# Patient Record
Sex: Female | Born: 1950 | Race: White | Hispanic: No | Marital: Married | Smoking: Former smoker
Health system: Southern US, Community
[De-identification: ages and names within clinical notes are randomized; demographics above are authoritative.]

## PROBLEM LIST (undated history)

## (undated) DIAGNOSIS — K5730 Diverticulosis of large intestine without perforation or abscess without bleeding: Secondary | ICD-10-CM

## (undated) DIAGNOSIS — Z8719 Personal history of other diseases of the digestive system: Secondary | ICD-10-CM

## (undated) DIAGNOSIS — I81 Portal vein thrombosis: Secondary | ICD-10-CM

## (undated) DIAGNOSIS — Z87442 Personal history of urinary calculi: Secondary | ICD-10-CM

## (undated) DIAGNOSIS — N200 Calculus of kidney: Secondary | ICD-10-CM

## (undated) DIAGNOSIS — J309 Allergic rhinitis, unspecified: Secondary | ICD-10-CM

## (undated) DIAGNOSIS — J189 Pneumonia, unspecified organism: Secondary | ICD-10-CM

## (undated) DIAGNOSIS — C801 Malignant (primary) neoplasm, unspecified: Secondary | ICD-10-CM

## (undated) DIAGNOSIS — D649 Anemia, unspecified: Secondary | ICD-10-CM

## (undated) DIAGNOSIS — F329 Major depressive disorder, single episode, unspecified: Secondary | ICD-10-CM

## (undated) DIAGNOSIS — N189 Chronic kidney disease, unspecified: Secondary | ICD-10-CM

## (undated) DIAGNOSIS — K631 Perforation of intestine (nontraumatic): Secondary | ICD-10-CM

## (undated) DIAGNOSIS — J45909 Unspecified asthma, uncomplicated: Secondary | ICD-10-CM

## (undated) DIAGNOSIS — Y95 Nosocomial condition: Secondary | ICD-10-CM

## (undated) DIAGNOSIS — E039 Hypothyroidism, unspecified: Secondary | ICD-10-CM

## (undated) DIAGNOSIS — E785 Hyperlipidemia, unspecified: Secondary | ICD-10-CM

## (undated) DIAGNOSIS — M1990 Unspecified osteoarthritis, unspecified site: Secondary | ICD-10-CM

## (undated) DIAGNOSIS — I10 Essential (primary) hypertension: Secondary | ICD-10-CM

## (undated) HISTORY — DX: Essential (primary) hypertension: I10

## (undated) HISTORY — PX: EYE SURGERY: SHX253

## (undated) HISTORY — DX: Allergic rhinitis, unspecified: J30.9

## (undated) HISTORY — PX: KIDNEY STONE SURGERY: SHX686

## (undated) HISTORY — DX: Major depressive disorder, single episode, unspecified: F32.9

## (undated) HISTORY — DX: Hyperlipidemia, unspecified: E78.5

## (undated) HISTORY — PX: APPENDECTOMY: SHX54

## (undated) HISTORY — DX: Hypothyroidism, unspecified: E03.9

---

## 1980-05-29 HISTORY — PX: THYROID SURGERY: SHX805

## 1984-05-29 HISTORY — PX: ABDOMINAL HYSTERECTOMY: SHX81

## 1998-11-13 ENCOUNTER — Emergency Department (HOSPITAL_COMMUNITY): Admission: EM | Admit: 1998-11-13 | Discharge: 1998-11-14 | Payer: Self-pay | Admitting: Emergency Medicine

## 1998-11-21 ENCOUNTER — Emergency Department (HOSPITAL_COMMUNITY): Admission: EM | Admit: 1998-11-21 | Discharge: 1998-11-21 | Payer: Self-pay | Admitting: Emergency Medicine

## 1998-11-21 ENCOUNTER — Encounter: Payer: Self-pay | Admitting: Emergency Medicine

## 1998-11-25 ENCOUNTER — Ambulatory Visit (HOSPITAL_COMMUNITY): Admission: RE | Admit: 1998-11-25 | Discharge: 1998-11-25 | Payer: Self-pay | Admitting: Urology

## 1998-11-25 ENCOUNTER — Encounter: Payer: Self-pay | Admitting: Urology

## 1999-07-13 ENCOUNTER — Other Ambulatory Visit: Admission: RE | Admit: 1999-07-13 | Discharge: 1999-07-13 | Payer: Self-pay | Admitting: *Deleted

## 2000-06-27 ENCOUNTER — Other Ambulatory Visit: Admission: RE | Admit: 2000-06-27 | Discharge: 2000-06-27 | Payer: Self-pay | Admitting: *Deleted

## 2001-06-03 ENCOUNTER — Encounter: Payer: Self-pay | Admitting: Family Medicine

## 2001-06-03 ENCOUNTER — Encounter: Admission: RE | Admit: 2001-06-03 | Discharge: 2001-06-03 | Payer: Self-pay | Admitting: Family Medicine

## 2002-09-12 ENCOUNTER — Other Ambulatory Visit: Admission: RE | Admit: 2002-09-12 | Discharge: 2002-09-12 | Payer: Self-pay | Admitting: *Deleted

## 2003-10-15 ENCOUNTER — Encounter: Admission: RE | Admit: 2003-10-15 | Discharge: 2003-10-15 | Payer: Self-pay | Admitting: Family Medicine

## 2004-06-23 ENCOUNTER — Ambulatory Visit: Payer: Self-pay | Admitting: Internal Medicine

## 2004-07-06 ENCOUNTER — Ambulatory Visit: Payer: Self-pay | Admitting: Internal Medicine

## 2004-09-05 ENCOUNTER — Ambulatory Visit (HOSPITAL_COMMUNITY): Admission: RE | Admit: 2004-09-05 | Discharge: 2004-09-05 | Payer: Self-pay | Admitting: Urology

## 2004-09-08 ENCOUNTER — Ambulatory Visit (HOSPITAL_COMMUNITY): Admission: RE | Admit: 2004-09-08 | Discharge: 2004-09-08 | Payer: Self-pay | Admitting: Urology

## 2004-10-18 ENCOUNTER — Inpatient Hospital Stay (HOSPITAL_COMMUNITY): Admission: EM | Admit: 2004-10-18 | Discharge: 2004-10-21 | Payer: Self-pay | Admitting: Emergency Medicine

## 2004-10-19 ENCOUNTER — Ambulatory Visit: Payer: Self-pay | Admitting: Cardiology

## 2004-10-19 ENCOUNTER — Encounter: Payer: Self-pay | Admitting: Cardiology

## 2005-05-27 ENCOUNTER — Emergency Department (HOSPITAL_COMMUNITY): Admission: EM | Admit: 2005-05-27 | Discharge: 2005-05-27 | Payer: Self-pay | Admitting: Emergency Medicine

## 2005-08-18 ENCOUNTER — Encounter: Payer: Self-pay | Admitting: Family Medicine

## 2006-11-12 ENCOUNTER — Inpatient Hospital Stay (HOSPITAL_COMMUNITY): Admission: EM | Admit: 2006-11-12 | Discharge: 2006-11-14 | Payer: Self-pay | Admitting: Emergency Medicine

## 2006-11-20 ENCOUNTER — Ambulatory Visit (HOSPITAL_BASED_OUTPATIENT_CLINIC_OR_DEPARTMENT_OTHER): Admission: RE | Admit: 2006-11-20 | Discharge: 2006-11-20 | Payer: Self-pay | Admitting: Urology

## 2007-06-22 ENCOUNTER — Emergency Department (HOSPITAL_COMMUNITY): Admission: EM | Admit: 2007-06-22 | Discharge: 2007-06-22 | Payer: Self-pay | Admitting: Emergency Medicine

## 2007-11-23 ENCOUNTER — Emergency Department (HOSPITAL_COMMUNITY): Admission: EM | Admit: 2007-11-23 | Discharge: 2007-11-23 | Payer: Self-pay | Admitting: Emergency Medicine

## 2008-03-01 ENCOUNTER — Emergency Department (HOSPITAL_COMMUNITY): Admission: EM | Admit: 2008-03-01 | Discharge: 2008-03-01 | Payer: Self-pay | Admitting: Emergency Medicine

## 2008-07-14 ENCOUNTER — Encounter: Payer: Self-pay | Admitting: Family Medicine

## 2008-07-21 ENCOUNTER — Encounter: Payer: Self-pay | Admitting: Family Medicine

## 2008-07-23 ENCOUNTER — Encounter: Admission: RE | Admit: 2008-07-23 | Discharge: 2008-07-23 | Payer: Self-pay | Admitting: Family Medicine

## 2008-09-21 ENCOUNTER — Encounter: Payer: Self-pay | Admitting: Family Medicine

## 2008-09-24 ENCOUNTER — Ambulatory Visit (HOSPITAL_COMMUNITY): Admission: RE | Admit: 2008-09-24 | Discharge: 2008-09-24 | Payer: Self-pay | Admitting: Urology

## 2008-10-06 ENCOUNTER — Ambulatory Visit: Payer: Self-pay | Admitting: Family Medicine

## 2008-10-06 DIAGNOSIS — I10 Essential (primary) hypertension: Secondary | ICD-10-CM

## 2008-10-06 DIAGNOSIS — E785 Hyperlipidemia, unspecified: Secondary | ICD-10-CM

## 2008-10-06 DIAGNOSIS — N200 Calculus of kidney: Secondary | ICD-10-CM

## 2008-10-06 DIAGNOSIS — E039 Hypothyroidism, unspecified: Secondary | ICD-10-CM | POA: Insufficient documentation

## 2008-10-06 HISTORY — DX: Essential (primary) hypertension: I10

## 2008-10-06 HISTORY — DX: Calculus of kidney: N20.0

## 2008-10-06 HISTORY — DX: Hypothyroidism, unspecified: E03.9

## 2008-10-06 HISTORY — DX: Hyperlipidemia, unspecified: E78.5

## 2008-10-07 ENCOUNTER — Encounter: Payer: Self-pay | Admitting: Family Medicine

## 2008-11-10 ENCOUNTER — Ambulatory Visit: Payer: Self-pay | Admitting: Family Medicine

## 2008-11-10 DIAGNOSIS — K5730 Diverticulosis of large intestine without perforation or abscess without bleeding: Secondary | ICD-10-CM

## 2008-11-10 HISTORY — DX: Diverticulosis of large intestine without perforation or abscess without bleeding: K57.30

## 2009-01-07 ENCOUNTER — Ambulatory Visit: Payer: Self-pay | Admitting: Family Medicine

## 2009-01-22 ENCOUNTER — Encounter: Payer: Self-pay | Admitting: Family Medicine

## 2009-01-29 ENCOUNTER — Ambulatory Visit: Payer: Self-pay | Admitting: Family Medicine

## 2009-01-29 DIAGNOSIS — B9789 Other viral agents as the cause of diseases classified elsewhere: Secondary | ICD-10-CM

## 2009-01-29 DIAGNOSIS — R5383 Other fatigue: Secondary | ICD-10-CM

## 2009-01-29 DIAGNOSIS — R5381 Other malaise: Secondary | ICD-10-CM

## 2009-01-29 DIAGNOSIS — R1013 Epigastric pain: Secondary | ICD-10-CM

## 2009-02-01 ENCOUNTER — Inpatient Hospital Stay (HOSPITAL_COMMUNITY): Admission: EM | Admit: 2009-02-01 | Discharge: 2009-02-04 | Payer: Self-pay | Admitting: Emergency Medicine

## 2009-02-16 ENCOUNTER — Encounter: Payer: Self-pay | Admitting: Family Medicine

## 2009-03-10 ENCOUNTER — Encounter: Payer: Self-pay | Admitting: Family Medicine

## 2009-03-12 ENCOUNTER — Telehealth: Payer: Self-pay | Admitting: Family Medicine

## 2009-04-12 ENCOUNTER — Ambulatory Visit: Payer: Self-pay | Admitting: Family Medicine

## 2009-04-12 DIAGNOSIS — J019 Acute sinusitis, unspecified: Secondary | ICD-10-CM | POA: Insufficient documentation

## 2009-05-06 ENCOUNTER — Telehealth: Payer: Self-pay | Admitting: Family Medicine

## 2009-05-07 ENCOUNTER — Encounter: Admission: RE | Admit: 2009-05-07 | Discharge: 2009-05-07 | Payer: Self-pay | Admitting: General Surgery

## 2009-05-14 ENCOUNTER — Encounter: Admission: RE | Admit: 2009-05-14 | Discharge: 2009-05-14 | Payer: Self-pay | Admitting: General Surgery

## 2009-06-21 ENCOUNTER — Encounter: Payer: Self-pay | Admitting: Family Medicine

## 2009-07-01 LAB — HM MAMMOGRAPHY: HM Mammogram: NORMAL

## 2009-07-02 ENCOUNTER — Encounter (INDEPENDENT_AMBULATORY_CARE_PROVIDER_SITE_OTHER): Payer: Self-pay | Admitting: *Deleted

## 2009-07-16 ENCOUNTER — Ambulatory Visit: Payer: Self-pay | Admitting: Family Medicine

## 2009-07-16 LAB — CONVERTED CEMR LAB
AST: 32 units/L (ref 0–37)
Albumin: 4 g/dL (ref 3.5–5.2)
Alkaline Phosphatase: 60 units/L (ref 39–117)
Basophils Relative: 0.9 % (ref 0.0–3.0)
Bilirubin, Direct: 0.1 mg/dL (ref 0.0–0.3)
CO2: 29 meq/L (ref 19–32)
Calcium: 10.1 mg/dL (ref 8.4–10.5)
Eosinophils Relative: 5.1 % — ABNORMAL HIGH (ref 0.0–5.0)
GFR calc non Af Amer: 109.72 mL/min (ref >60)
HDL: 49.8 mg/dL (ref >39.00)
Hemoglobin: 12 g/dL (ref 12.0–15.0)
Ketones, urine, test strip: NEGATIVE
Lymphocytes Relative: 34.2 % (ref 12.0–46.0)
Monocytes Relative: 7.4 % (ref 3.0–12.0)
Neutro Abs: 2.9 10*3/uL (ref 1.4–7.7)
Neutrophils Relative %: 52.4 % (ref 43.0–77.0)
Nitrite: NEGATIVE
Protein, U semiquant: NEGATIVE
RBC: 3.96 M/uL (ref 3.87–5.11)
Sodium: 141 meq/L (ref 135–145)
Total CHOL/HDL Ratio: 4
Total Protein: 6.9 g/dL (ref 6.0–8.3)
Triglycerides: 265 mg/dL — ABNORMAL HIGH (ref 0.0–149.0)
Urobilinogen, UA: 0.2
VLDL: 53 mg/dL — ABNORMAL HIGH (ref 0.0–40.0)
WBC: 5.4 10*3/uL (ref 4.5–10.5)

## 2009-07-23 ENCOUNTER — Ambulatory Visit: Payer: Self-pay | Admitting: Family Medicine

## 2009-07-23 DIAGNOSIS — J309 Allergic rhinitis, unspecified: Secondary | ICD-10-CM

## 2009-07-23 HISTORY — DX: Allergic rhinitis, unspecified: J30.9

## 2009-07-26 ENCOUNTER — Encounter: Admission: RE | Admit: 2009-07-26 | Discharge: 2009-07-26 | Payer: Self-pay | Admitting: Family Medicine

## 2009-07-29 ENCOUNTER — Telehealth: Payer: Self-pay | Admitting: Family Medicine

## 2009-07-29 DIAGNOSIS — M25519 Pain in unspecified shoulder: Secondary | ICD-10-CM

## 2009-08-10 ENCOUNTER — Telehealth: Payer: Self-pay | Admitting: Family Medicine

## 2009-08-11 ENCOUNTER — Encounter (INDEPENDENT_AMBULATORY_CARE_PROVIDER_SITE_OTHER): Payer: Self-pay | Admitting: *Deleted

## 2009-08-12 ENCOUNTER — Ambulatory Visit: Payer: Self-pay | Admitting: Internal Medicine

## 2009-08-16 ENCOUNTER — Telehealth: Payer: Self-pay | Admitting: Family Medicine

## 2009-08-26 ENCOUNTER — Ambulatory Visit: Payer: Self-pay | Admitting: Internal Medicine

## 2009-09-14 ENCOUNTER — Encounter: Payer: Self-pay | Admitting: Family Medicine

## 2009-09-14 ENCOUNTER — Encounter: Admission: RE | Admit: 2009-09-14 | Discharge: 2009-09-14 | Payer: Self-pay | Admitting: Family Medicine

## 2009-09-21 ENCOUNTER — Encounter: Payer: Self-pay | Admitting: Family Medicine

## 2009-09-29 ENCOUNTER — Ambulatory Visit: Payer: Self-pay | Admitting: Family Medicine

## 2009-09-30 LAB — CONVERTED CEMR LAB: TSH: 9.36 microintl units/mL — ABNORMAL HIGH (ref 0.35–5.50)

## 2009-10-01 ENCOUNTER — Telehealth: Payer: Self-pay | Admitting: Family Medicine

## 2009-10-11 ENCOUNTER — Ambulatory Visit: Payer: Self-pay | Admitting: Family Medicine

## 2009-10-11 LAB — CONVERTED CEMR LAB
Bilirubin Urine: NEGATIVE
Blood in Urine, dipstick: NEGATIVE
Glucose, Urine, Semiquant: NEGATIVE
Protein, U semiquant: NEGATIVE
Specific Gravity, Urine: 1.015
pH: 6

## 2009-12-27 ENCOUNTER — Encounter: Payer: Self-pay | Admitting: Family Medicine

## 2010-01-04 ENCOUNTER — Ambulatory Visit: Payer: Self-pay | Admitting: Family Medicine

## 2010-01-05 LAB — CONVERTED CEMR LAB: TSH: 7.4 microintl units/mL — ABNORMAL HIGH (ref 0.35–5.50)

## 2010-02-23 ENCOUNTER — Ambulatory Visit: Payer: Self-pay | Admitting: Family Medicine

## 2010-02-23 DIAGNOSIS — F329 Major depressive disorder, single episode, unspecified: Secondary | ICD-10-CM

## 2010-02-23 DIAGNOSIS — F3289 Other specified depressive episodes: Secondary | ICD-10-CM

## 2010-02-23 HISTORY — DX: Other specified depressive episodes: F32.89

## 2010-02-23 HISTORY — DX: Major depressive disorder, single episode, unspecified: F32.9

## 2010-03-28 ENCOUNTER — Ambulatory Visit: Payer: Self-pay | Admitting: Family Medicine

## 2010-03-28 ENCOUNTER — Telehealth: Payer: Self-pay | Admitting: Family Medicine

## 2010-04-01 ENCOUNTER — Ambulatory Visit: Payer: Self-pay | Admitting: Family Medicine

## 2010-04-18 ENCOUNTER — Ambulatory Visit: Payer: Self-pay | Admitting: Family Medicine

## 2010-05-03 ENCOUNTER — Ambulatory Visit: Payer: Self-pay | Admitting: Family Medicine

## 2010-05-19 ENCOUNTER — Telehealth: Payer: Self-pay | Admitting: Family Medicine

## 2010-05-19 ENCOUNTER — Ambulatory Visit: Payer: Self-pay | Admitting: Family Medicine

## 2010-06-19 ENCOUNTER — Encounter: Payer: Self-pay | Admitting: Family Medicine

## 2010-06-21 ENCOUNTER — Other Ambulatory Visit: Payer: Self-pay | Admitting: Family Medicine

## 2010-06-21 DIAGNOSIS — Z1239 Encounter for other screening for malignant neoplasm of breast: Secondary | ICD-10-CM

## 2010-06-28 NOTE — Letter (Signed)
Summary: [facility] Instructions  [name]  [address] [place], [place] [address]   Phone: [phone]  Fax: [phone]       [name]    Feb 02, 1951    MRN: [id number]        Procedure Day Dorna Bloom:  [name]  08/26/09     Arrival Time:  10:00AM     Procedure Time:  11:00AM     Location of Procedure:                    [name] _  [facility] (4th Floor)                        PREPARATION FOR COLONOSCOPY WITH MOVIPREP   Starting 5 days prior to your procedure 08/21/09 do not eat nuts, seeds, popcorn, corn, beans, peas,  salads, or any raw vegetables.  Do not take any fiber supplements (e.g. Metamucil, Citrucel, and Benefiber).  THE DAY BEFORE YOUR PROCEDURE         DATE: 08/25/09  DAY: WEDNESDAY  1.  Drink clear liquids the entire day-NO SOLID FOOD  2.  Do not drink anything colored red or purple.  Avoid juices with pulp.  No orange juice.  3.  Drink at least 64 oz. (8 glasses) of fluid/clear liquids during the day to prevent dehydration and help the prep work efficiently.  CLEAR LIQUIDS INCLUDE: Water Jello Ice Popsicles Tea (sugar ok, no milk/cream) Powdered fruit flavored drinks Coffee (sugar ok, no milk/cream) Gatorade Juice: apple, white grape, white cranberry  Lemonade Clear bullion, consomm, broth Carbonated beverages (any kind) Strained chicken noodle soup Hard Candy                             4.  In the morning, mix first dose of MoviPrep solution:    Empty 1 Pouch A and 1 Pouch B into the disposable container    Add lukewarm drinking water to the top line of the container. Mix to dissolve    Refrigerate (mixed solution should be used within 24 hrs)  5.  Begin drinking the prep at 5:00 p.m. The MoviPrep container is divided by 4 marks.   Every 15 minutes drink the solution down to the next mark (approximately 8 oz) until the full liter is complete.   6.  Follow completed prep with 16 oz of clear liquid of your  choice (Nothing red or purple).  Continue to drink clear liquids until bedtime.  7.  Before going to bed, mix second dose of MoviPrep solution:    Empty 1 Pouch A and 1 Pouch B into the disposable container    Add lukewarm drinking water to the top line of the container. Mix to dissolve    Refrigerate  THE DAY OF YOUR PROCEDURE      DATE: 08/26/09  DAY: THURSDAY  Beginning at 6:00AM (5 hours before procedure):         1. Every 15 minutes, drink the solution down to the next mark (approx 8 oz) until the full liter is complete.  2. Follow completed prep with 16 oz. of clear liquid of your choice.    3. You may drink clear liquids until 9:00AM (2 HOURS BEFORE PROCEDURE).   MEDICATION INSTRUCTIONS  Unless otherwise instructed, you should take regular prescription medications with a small sip of water   as early as possible the morning  of your procedure.         OTHER INSTRUCTIONS  You will need a responsible adult at least 58 years of age to accompany you and drive you home.   This person must remain in the waiting room during your procedure.  Wear loose fitting clothing that is easily removed.  Leave jewelry and other valuables at home.  However, you may wish to bring a book to read or  an iPod/MP3 player to listen to music as you wait for your procedure to start.  Remove all body piercing jewelry and leave at home.  Total time from sign-in until discharge is approximately 2-3 hours.  You should go home directly after your procedure and rest.  You can resume normal activities the  day after your procedure.  The day of your procedure you should not:   Drive   Make legal decisions   Operate machinery   Drink alcohol   Return to work  You will receive specific instructions about eating, activities and medications before you leave.    The above instructions have been reviewed and explained to me by  Karl Bales RN  August 12, 2009 9:27 AM     I fully  understand and can verbalize these instructions _____________________________ Date _________

## 2010-06-28 NOTE — Letter (Signed)
Summary: [facility]   Imported By: Maryln Gottron 11/16/2009 15:23:09  _____________________________________________________________________  External Attachment:    Type:   Image     Comment:   External Document

## 2010-06-28 NOTE — Assessment & Plan Note (Signed)
Summary: R WRIST INJURY // RS   Vital Signs:  Patient profile:   60 year old female Temp:     98.6 degrees F oral BP sitting:   120 / 80  (left arm) Cuff size:   regular  Vitals Entered By: Sid Falcon LPN (April 18, 2010 3:20 PM)  History of Present Illness: Patient seen with right wrist and hand injury. She fell yesterday after slipping on wet floor. She fell into a cabinet. Does not recall exact mechanism of right wrist injury. She has some swelling afterwards and pain with extension and flexion of wrist. Applied ice immediately afterwards. No other injuries reported.  Allergies: 1)  ! Provigil (Modafinil)  Past History:  Past Medical History: Last updated: 07/23/2009 Arthritis Asthma Thyroid cancer  Depression Diverticulitis Hyperlipidemia Hypertension Kidney stones Hypothyroidism  Physical Exam  General:  Well-developed,well-nourished,in no acute distress; alert,appropriate and cooperative throughout examination Lungs:  Normal respiratory effort, chest expands symmetrically. Lungs are clear to auscultation, no crackles or wheezes. Heart:  normal rate and regular rhythm.   Extremities:  right wrist reveals some obvious swelling distally. There is no visible ecchymosis. She has pain with extension and flexion. She has tenderness over the distal radius and also some noted tenderness over navicular region. There is no ulnar tenderness. Full range of motion all digits of the hand   Impression & Recommendations:  Problem # 1:  WRIST PAIN (UVO-536.64) Assessment New clinically, need to rule out distal radial as well as navicular fracture. Even if x-ray is negative for navicular fracture needs thumb spica splint and re- evaluation in 2 weeks given her tenderness over navicular region Orders: T-Wrist Comp Right (73110TC)  Complete Medication List: 1)  Cozaar 50 Mg Tabs (Losartan potassium) .... Once daily 2)  Mobic 15 Mg Tabs (Meloxicam) .... One tab daily 3)   Zoloft 100 Mg Tabs (Sertraline hcl) .... Once daily 4)  Ambien 10 Mg Tabs (Zolpidem tartrate) .... Once daily 5)  Singulair 10 Mg Tabs (Montelukast sodium) .... Once daily 6)  Vitamin D 1000 Unit Caps (Cholecalciferol) .... 2 tabs daily, on hold 7)  Womens 50+ Multi Vitamin/min Tabs (Multiple vitamins-minerals) .... Once daily, on hold 8)  Claritin 10 Mg Caps (Loratadine) .... Once daily 9)  Proventil Hfa 108 (90 Base) Mcg/act Aers (Albuterol sulfate) .... As needed 10)  Urocit-k 15 15 Meq (1620 Mg) Cr-tabs (Potassium citrate) .... One tab two times a day 11)  Flaxseed Oil 1000 Mg Caps (Flaxseed (linseed)) .... 2 daily 12)  Oxycodone-acetaminophen 5-325 Mg Tabs (Oxycodone-acetaminophen) .... As needed pain 13)  Synthroid 137 Mcg Tabs (Levothyroxine sodium) .... Once daily daw, brand only 14)  Wellbutrin Xl 150 Mg Xr24h-tab (Bupropion hcl) .... One by mouth once daily  Other Orders: T-Hand Right 3 views (73130TC)  Patient Instructions: 1)  Please schedule a follow-up appointment in 2 weeks.  2)  Continue with ice and elevation of wrist and hand. 3)  We will call you regarding x-ray result. 4)  Keep splint on at all times.   Orders Added: 1)  T-Wrist Comp Right [73110TC] 2)  T-Hand Right 3 views [73130TC] 3)  Est. Patient Level III [[address]]

## 2010-06-28 NOTE — Miscellaneous (Signed)
Summary: RECALL COL.Marland KitchenMarland KitchenEM  Clinical Lists Changes  Medications: Added new medication of MOVIPREP 100 GM  SOLR (PEG-KCL-NACL-NASULF-NA ASC-C) As per prep instructions. - Signed Rx of MOVIPREP 100 GM  SOLR (PEG-KCL-NACL-NASULF-NA ASC-C) As per prep instructions.;  #1 x 0;  Signed;  Entered by: Karl Bales RN;  Authorized by: Mardella Layman MD [facility];  Method used: Electronically to [facility] [address] [phone]*, [address], [place], [place]  [address], Ph: [phone], Fax: [phone]    Prescriptions: MOVIPREP 100 GM  SOLR (PEG-KCL-NACL-NASULF-NA ASC-C) As per prep instructions.  #1 x 0   Entered by:   Karl Bales RN   Authorized by:   Mardella Layman MD [facility]   Signed by:   Karl Bales RN on 08/12/2009   Method used:   Electronically to        [facility] [address] [phone]* (retail)       [address]       [place], [place]  [address]       Ph: [phone]       Fax: [phone]   RxID:   [id number]   Appended Document: RECALL COL.Marland KitchenMarland KitchenEM Pt is a patient of Dr. Yancey Flemings, not Dr. Jarold Motto.

## 2010-06-28 NOTE — Letter (Signed)
Summary: [facility]  [facility]   Imported By: Maryln Gottron 01/03/2010 11:28:00  _____________________________________________________________________  External Attachment:    Type:   Image     Comment:   External Document

## 2010-06-28 NOTE — Progress Notes (Signed)
Summary: Oxycodone refill request  Phone Note Call from Patient   Caller: Spouse Call For: Evelena Peat MD Summary of Call: Pt is feeling better, however has run out of ther pain med, requesting refill of Oxycodone 5-325. [facility] Initial call taken by: Sid Falcon LPN,  Oct 02, 6267 9:06 AM  Follow-up for Phone Call        will refill once  Follow-up by: Evelena Peat MD,  Oct 01, 2009 10:13 AM  Additional Follow-up for Phone Call Additional follow up Details #1::        Pt husband informed Rx ready for pick-up Additional Follow-up by: Sid Falcon LPN,  Oct 01, 4852 10:30 AM    Prescriptions: OXYCODONE-ACETAMINOPHEN 5-325 MG TABS (OXYCODONE-ACETAMINOPHEN) as needed pain  #20 x 0   Entered and Authorized by:   Evelena Peat MD   Signed by:   Evelena Peat MD on 10/01/2009   Method used:   Print then Give to Patient   RxID:   [id number]

## 2010-06-28 NOTE — Assessment & Plan Note (Signed)
Summary: severe lower back pain/cjr   Vital Signs:  Patient profile:   60 year old female Temp:     98.7 degrees F oral BP sitting:   140 / 88  (left arm) Cuff size:   regular  Vitals Entered By: Sid Falcon LPN (Oct 11, 2009 1:59 PM) CC: severe low back pain   History of Present Illness: Patient here with low back pain. Started 9 days ago while walking. Sudden sharp pain lower lumbar area. Initially did better but by last Thursday had some recurrence and more persistent since then. Moderate to severe at times. Sharp quality. No radiation. Taking hydrocodone and Flexeril which did relieve somewhat. No associated numbness, weakness, or incontinence. No appetite or weight changes.  She has pain pretty much any position.  Allergies: 1)  ! Provigil (Modafinil)  Past History:  Past Medical History: Last updated: 07/23/2009 Arthritis Asthma Thyroid cancer  Depression Diverticulitis Hyperlipidemia Hypertension Kidney stones Hypothyroidism  Review of Systems  The patient denies anorexia, fever, weight loss, abdominal pain, hematuria, incontinence, and muscle weakness.    Physical Exam  General:  Well-developed,well-nourished,in no acute distress; alert,appropriate and cooperative throughout examination Lungs:  Normal respiratory effort, chest expands symmetrically. Lungs are clear to auscultation, no crackles or wheezes. Heart:  Normal rate and regular rhythm. S1 and S2 normal without gallop, murmur, click, rub or other extra sounds. Msk:  no reproducible point tenderness in low back Extremities:  normal dorsalis pedis pulses bilaterally straight leg raise is negative.   Neurologic:  strength normal in all extremities, sensation intact to light touch, and DTRs symmetrical and normal.     Impression & Recommendations:  Problem # 1:  LUMBAGO (ICD-724.2)  continue Flexeril and hydrocodone as needed. Extension stretches reviewed. Her updated medication list for this  problem includes:    Mobic 15 Mg Tabs (Meloxicam) ..... One tab daily    Oxycodone-acetaminophen 5-325 Mg Tabs (Oxycodone-acetaminophen) .Marland Kitchen... As needed pain  Orders: UA Dipstick w/o Micro (manual) (02542)  Complete Medication List: 1)  Cozaar 50 Mg Tabs (Losartan potassium) .... Once daily 2)  Mobic 15 Mg Tabs (Meloxicam) .... One tab daily 3)  Zoloft 100 Mg Tabs (Sertraline hcl) .... Once daily 4)  Ambien 10 Mg Tabs (Zolpidem tartrate) .... Once daily 5)  Singulair 10 Mg Tabs (Montelukast sodium) .... Once daily 6)  Vitamin D 1000 Unit Caps (Cholecalciferol) .... 2 tabs daily, on hold 7)  Womens 50+ Multi Vitamin/min Tabs (Multiple vitamins-minerals) .... Once daily, on hold 8)  Claritin 10 Mg Caps (Loratadine) .... Once daily 9)  Proventil Hfa 108 (90 Base) Mcg/act Aers (Albuterol sulfate) .... As needed 10)  Synthroid 112 Mcg Tabs (Levothyroxine sodium) .... Alternate every other day with 125 mg 11)  Urocit-k 15 15 Meq (1620 Mg) Cr-tabs (Potassium citrate) .... One tab two times a day 12)  Astelin 137 Mcg/spray Soln (Azelastine hcl) .... 2 sprays per nostril two times a day prn 13)  Flaxseed Oil 1000 Mg Caps (Flaxseed (linseed)) .... 2 daily 14)  Oxycodone-acetaminophen 5-325 Mg Tabs (Oxycodone-acetaminophen) .... As needed pain 15)  Metronidazole 500 Mg Tabs (Metronidazole) .... One by mouth three times a day for 10 days 16)  Ciprofloxacin Hcl 750 Mg Tabs (Ciprofloxacin hcl) .... One by mouth two times a day for 10 days 17)  Synthroid 125 Mcg Tabs (Levothyroxine sodium) .... Alternate every other day with 112 mg  Patient Instructions: 1)  Most patients (90%) with low back pain will improve with time ( 2-6 weeks).  Keep active but avoid activities that are painful. Apply moist heat and/or ice to lower back several times a day.  2)  Do extension stretches as instructed. 3)  Use firm mattress. 4)  Follow up in 2 weeks if no better and sooner if any weakness, lower extremity numbness,  or any progressive pain noted.  Laboratory Results   Urine Tests    Routine Urinalysis   Color: yellow Appearance: Clear Glucose: negative   (Normal Range: Negative) Bilirubin: negative   (Normal Range: Negative) Ketone: negative   (Normal Range: Negative) Spec. Gravity: 1.015   (Normal Range: 1.003-1.035) Blood: negative   (Normal Range: Negative) pH: 6.0   (Normal Range: 5.0-8.0) Protein: negative   (Normal Range: Negative) Urobilinogen: 0.2   (Normal Range: 0-1) Nitrite: negative   (Normal Range: Negative) Leukocyte Esterace: small   (Normal Range: Negative)    Comments: Sid Falcon LPN  Oct 11, 2009 2:06 PM

## 2010-06-28 NOTE — Letter (Signed)
Summary: [facility]   Imported By: Maryln Gottron 09/24/2009 11:20:53  _____________________________________________________________________  External Attachment:    Type:   Image     Comment:   External Document

## 2010-06-28 NOTE — Assessment & Plan Note (Signed)
Summary: flare up diverticulitis last night/njr   Vital Signs:  Patient profile:   60 year old female Weight:      198 pounds Temp:     99.5 degrees F oral BP sitting:   88 / 64  (left arm) Cuff size:   regular  Vitals Entered By: Sid Falcon LPN (Sep 29, 7104 11:48 AM)  Serial Vital Signs/Assessments:  Time      Position  BP       Pulse  Resp  Temp     By 12:16 PM            100/78                         Sid Falcon LPN  CC: diverticulitis flare-up   History of Present Illness: Patient here for followup regarding:  History of recurrent diverticulitis. Onset last night of moderate to severe pain left lower quadrant. Pain is sharp and achy at times. Exacerbated somewhat with movement. No change of bowel habits. Low-grade fever. No chills. Denies any nausea or vomiting. Has responded well to oral antibiotics in the past.  No alleviating pain.  Hypothyroidism. Needs repeat TSH. Compliant with therapy.  over-replaced by recent labs and thyroid dose reduced.  Allergies: 1)  ! Provigil (Modafinil)  Past History:  Past Medical History: Last updated: 07/23/2009 Arthritis Asthma Thyroid cancer  Depression Diverticulitis Hyperlipidemia Hypertension Kidney stones Hypothyroidism  Past Surgical History: Last updated: 10/06/2008 Hysterectomy 1986 Thyroid tumor removed 1982 C-Section 1983 Rotator cuff 2005 Kidney stone rempoval 2000, 2009, 2010  Social History: Last updated: 10/06/2008 Occupation:  Marine scientist Married Alcohol use-no Previous smoker PMH reviewed for relevance, SH/Risk Factors reviewed for relevance  Review of Systems       The patient complains of fever.  The patient denies anorexia, weight loss, chest pain, syncope, dyspnea on exertion, melena, hematochezia, and severe indigestion/heartburn.    Physical Exam  General:  Well-developed,well-nourished,in no acute distress; alert,appropriate and cooperative throughout examination Mouth:   Oral mucosa and oropharynx without lesions or exudates.  Teeth in good repair. Neck:  No deformities, masses, or tenderness noted. Lungs:  Normal respiratory effort, chest expands symmetrically. Lungs are clear to auscultation, no crackles or wheezes. Heart:  Normal rate and regular rhythm. S1 and S2 normal without gallop, murmur, click, rub or other extra sounds. Abdomen:  soft, normal bowel sounds, no distention, and no masses.  tender left lower quadrant to deep palpation. No guarding or rebound. No palpated masses.   Impression & Recommendations:  Problem # 1:  DIVERTICULITIS, ACUTE (ICD-562.11) Assessment New get back on Cipro and Flagyl.  Problem # 2:  HYPOTHYROIDISM (ICD-244.9) reassess labs. Her updated medication list for this problem includes:    Synthroid 112 Mcg Tabs (Levothyroxine sodium) ..... One by mouth once daily  Orders: Venipuncture (26948) TLB-TSH (Thyroid Stimulating Hormone) (84443-TSH)  Complete Medication List: 1)  Cozaar 50 Mg Tabs (Losartan potassium) .... Once daily 2)  Mobic 15 Mg Tabs (Meloxicam) .... One tab daily 3)  Zoloft 100 Mg Tabs (Sertraline hcl) .... Once daily 4)  Ambien 10 Mg Tabs (Zolpidem tartrate) .... Once daily 5)  Singulair 10 Mg Tabs (Montelukast sodium) .... Once daily 6)  Vitamin D 1000 Unit Caps (Cholecalciferol) .... 2 tabs daily, on hold 7)  Womens 50+ Multi Vitamin/min Tabs (Multiple vitamins-minerals) .... Once daily, on hold 8)  Claritin 10 Mg Caps (Loratadine) .... Once daily 9)  Proventil Hfa 108 (90  Base) Mcg/act Aers (Albuterol sulfate) .... As needed 10)  Synthroid 112 Mcg Tabs (Levothyroxine sodium) .... One by mouth once daily 11)  Urocit-k 15 15 Meq (1620 Mg) Cr-tabs (Potassium citrate) .... One tab two times a day 12)  Astelin 137 Mcg/spray Soln (Azelastine hcl) .... 2 sprays per nostril two times a day prn 13)  Flaxseed Oil 1000 Mg Caps (Flaxseed (linseed)) .... 2 daily 14)  Oxycodone-acetaminophen 5-325 Mg Tabs  (Oxycodone-acetaminophen) .... As needed pain 15)  Metronidazole 500 Mg Tabs (Metronidazole) .... One by mouth three times a day for 10 days 16)  Ciprofloxacin Hcl 750 Mg Tabs (Ciprofloxacin hcl) .... One by mouth two times a day for 10 days  Patient Instructions: 1)  Follow up immediately if you have any severe nausea or vomiting or worsening abdominal pain Prescriptions: CIPROFLOXACIN HCL 750 MG TABS (CIPROFLOXACIN HCL) one by mouth two times a day for 10 days  #20 x 0   Entered and Authorized by:   Evelena Peat MD   Signed by:   Evelena Peat MD on 09/29/2009   Method used:   Electronically to        [facility]. [phone]* (retail)       [phone] [address]       [place], [place]  [address]       Ph: [phone]       Fax: [phone]   RxID:   [id number] METRONIDAZOLE 500 MG TABS (METRONIDAZOLE) one by mouth three times a day for 10 days  #30 x 0   Entered and Authorized by:   Evelena Peat MD   Signed by:   Evelena Peat MD on 09/29/2009   Method used:   Electronically to        [facility]. [phone]* (retail)       [phone] [address]       [place], [place]  [address]       Ph: [phone]       Fax: [phone]   RxID:   [id number]

## 2010-06-28 NOTE — Letter (Signed)
Summary: Colonoscopy Letter  [facility]  [address] [place], [place] [address]   Phone: [phone]  Fax: [phone]      July 02, 2009 MRN: [id number]   [facility] [address] [place], [place]  [address]   Dear Ms. Liebler,   According to your medical record, it is time for you to schedule a Colonoscopy. The American Cancer Society recommends this procedure as a method to detect early colon cancer. Patients with a family history of colon cancer, or a personal history of colon polyps or inflammatory bowel disease are at increased risk.  This letter has beeen generated based on the recommendations made at the time of your procedure. If you feel that in your particular situation this may no longer apply, please contact our office.  Please call our office at [phone] to schedule this appointment or to update your records at your earliest convenience.  Thank you for cooperating with [place] to provide you with the very best care possible.   Sincerely,  Wilhemina Bonito. Marina Goodell, M.D.  [facility] [phone]

## 2010-06-28 NOTE — Assessment & Plan Note (Signed)
Summary: CONSULT RE: MED AND SHOULDER PAIN/CJR   Vital Signs:  Patient profile:   60 year old female Weight:      200 pounds Temp:     98.1 degrees F oral BP sitting:   120 / 80  (left arm) Cuff size:   regular  Vitals Entered By: Kathrynn Speed CMA (February 23, 2010 3:05 PM) CC: shoulder pain, possible Rx for Welbutrin,src Is Patient Diabetic? No   History of Present Illness: Patient seen for the following items  New problem right shoulder pain for approx. 2 weeks. No injury. Quality is achy. Moderate severity. Pain radiates slightly to the deltoid. Worse with internal rotation. Constant pain. Worse at night. MOBIC without relief. No swelling. Prior history of left rotator cuff repair.  Other issue is worsening depression. Long history of depression currently treated Zoloft. Has previously been on combination with Wellbutrin. Has low energy and low initiative.  Would like to add back Wellbutrin.  No suicidal ideation.  Current Medications (verified): 1)  Cozaar 50 Mg Tabs (Losartan Potassium) .... Once Daily 2)  Mobic 15 Mg Tabs (Meloxicam) .... One Tab Daily 3)  Zoloft 100 Mg Tabs (Sertraline Hcl) .... Once Daily 4)  Ambien 10 Mg Tabs (Zolpidem Tartrate) .... Once Daily 5)  Singulair 10 Mg Tabs (Montelukast Sodium) .... Once Daily 6)  Vitamin D 1000 Unit Caps (Cholecalciferol) .... 2 Tabs Daily, On Hold 7)  Womens 50+ Multi Vitamin/min  Tabs (Multiple Vitamins-Minerals) .... Once Daily, On Hold 8)  Claritin 10 Mg Caps (Loratadine) .... Once Daily 9)  Proventil Hfa 108 (90 Base) Mcg/act Aers (Albuterol Sulfate) .... As Needed 10)  Urocit-K 15 15 Meq (1620 Mg) Cr-Tabs (Potassium Citrate) .... One Tab Two Times A Day 11)  Flaxseed Oil 1000 Mg Caps (Flaxseed (Linseed)) .... 2 Daily 12)  Oxycodone-Acetaminophen 5-325 Mg Tabs (Oxycodone-Acetaminophen) .... As Needed Pain 13)  Synthroid 125 Mcg Tabs (Levothyroxine Sodium) .Marland Kitchen.. 1 By Mouth Once Daily  Allergies (verified): 1)  !  Provigil (Modafinil)  Past History:  Past Medical History: Last updated: 07/23/2009 Arthritis Asthma Thyroid cancer  Depression Diverticulitis Hyperlipidemia Hypertension Kidney stones Hypothyroidism PMH reviewed for relevance  Review of Systems  The patient denies anorexia, weight loss, chest pain, dyspnea on exertion, peripheral edema, prolonged cough, headaches, and muscle weakness.    Physical Exam  General:  Well-developed,well-nourished,in no acute distress; alert,appropriate and cooperative throughout examination Neck:  No deformities, masses, or tenderness noted. Lungs:  Normal respiratory effort, chest expands symmetrically. Lungs are clear to auscultation, no crackles or wheezes. Heart:  Normal rate and regular rhythm. S1 and S2 normal without gallop, murmur, click, rub or other extra sounds. Extremities:  patient has fair range of motion right shoulder but pain with internal rotation and also abduction greater than 90. No a.c. joint tenderness. No biceps or triceps tenderness. Neurologic:  strength normal in all extremities.     Impression & Recommendations:  Problem # 1:  SHOULDER PAIN, RIGHT (ICD-719.41) Assessment New Suspect rotator cuff tendonitis/bursitis. after reviewing risks and benefits and obtaining informed consent prepped right shoulder with Betadine and using sterile technique injected 40 mg Depo-Medrol 2 cc plain Xylocaine using posterior lateral approach without difficulty Her updated medication list for this problem includes:    Mobic 15 Mg Tabs (Meloxicam) ..... One tab daily    Oxycodone-acetaminophen 5-325 Mg Tabs (Oxycodone-acetaminophen) .Marland Kitchen... As needed pain  Orders: Joint Aspirate / Injection, Large (20610) Depo- Medrol 40mg  (J1030)  Problem # 2:  DEPRESSION (ICD-311) Assessment: Deteriorated  add back Wellbutrin 150 mg.  Reassess one month. Her updated medication list for this problem includes:    Zoloft 100 Mg Tabs (Sertraline hcl)  ..... Once daily    Wellbutrin Xl 150 Mg Xr24h-tab (Bupropion hcl) ..... One by mouth once daily  Complete Medication List: 1)  Cozaar 50 Mg Tabs (Losartan potassium) .... Once daily 2)  Mobic 15 Mg Tabs (Meloxicam) .... One tab daily 3)  Zoloft 100 Mg Tabs (Sertraline hcl) .... Once daily 4)  Ambien 10 Mg Tabs (Zolpidem tartrate) .... Once daily 5)  Singulair 10 Mg Tabs (Montelukast sodium) .... Once daily 6)  Vitamin D 1000 Unit Caps (Cholecalciferol) .... 2 tabs daily, on hold 7)  Womens 50+ Multi Vitamin/min Tabs (Multiple vitamins-minerals) .... Once daily, on hold 8)  Claritin 10 Mg Caps (Loratadine) .... Once daily 9)  Proventil Hfa 108 (90 Base) Mcg/act Aers (Albuterol sulfate) .... As needed 10)  Urocit-k 15 15 Meq (1620 Mg) Cr-tabs (Potassium citrate) .... One tab two times a day 11)  Flaxseed Oil 1000 Mg Caps (Flaxseed (linseed)) .... 2 daily 12)  Oxycodone-acetaminophen 5-325 Mg Tabs (Oxycodone-acetaminophen) .... As needed pain 13)  Synthroid 125 Mcg Tabs (Levothyroxine sodium) .Marland Kitchen.. 1 by mouth once daily 14)  Wellbutrin Xl 150 Mg Xr24h-tab (Bupropion hcl) .... One by mouth once daily  Patient Instructions: 1)  Please schedule a follow-up appointment in 2 weeks.  Prescriptions: WELLBUTRIN XL 150 MG XR24H-TAB (BUPROPION HCL) one by mouth once daily  #30 x 5   Entered and Authorized by:   Evelena Peat MD   Signed by:   Evelena Peat MD on 02/23/2010   Method used:   Electronically to        [facility]. [phone]* (retail)       [phone]       [place], [place]  [address]       Ph: [phone]       Fax: [phone]   RxID:   [id number]

## 2010-06-28 NOTE — Progress Notes (Signed)
Summary: Referral to Ortho request  Phone Note Call from Patient Call back at Home Phone [phone]   Caller: Patient Call For: Misty Peat MD Summary of Call: VM from pt requesting referral to [facility] for right shoulder.  Pt has appt 3/11, forgot to ask for referral at [facility] earlier.  Initial call taken by: Sid Falcon LPN,  July 29, 2009 12:23 PM  Follow-up for Phone Call        OK to refer. Follow-up by: Misty Peat MD,  July 29, 2009 12:37 PM  New Problems: SHOULDER PAIN, RIGHT (ICD-719.41)   New Problems: SHOULDER PAIN, RIGHT (ICD-719.41)

## 2010-06-28 NOTE — Procedures (Signed)
Summary: Colonoscopy/[facility]  Colonoscopy/[facility]   Imported By: Sherian Rein 08/27/2009 07:37:03  _____________________________________________________________________  External Attachment:    Type:   Image     Comment:   External Document

## 2010-06-28 NOTE — Progress Notes (Signed)
Summary: please order labs  Phone Note Call from Patient Call back at Home Phone [phone]   Caller: Patient--walk in Reason for Call: Acute Illness Summary of Call: was just seen. requesting tsh labs. please order Initial call taken by: Warnell Forester,  March 28, 2010 4:16 PM  Follow-up for Phone Call        Order TSH  ICD-9 244.9 Follow-up by: Evelena Peat MD,  March 28, 2010 5:35 PM  Additional Follow-up for Phone Call Additional follow up Details #1::        lmoam to return my call. Additional Follow-up by: Warnell Forester,  March 29, 2010 8:27 AM    Additional Follow-up for Phone Call Additional follow up Details #2::    lmoam to return my call. Follow-up by: Warnell Forester,  March 31, 2010 2:14 PM  Additional Follow-up for Phone Call Additional follow up Details #3:: Details for Additional Follow-up Action Taken: pt had labs done today. Additional Follow-up by: Warnell Forester,  April 01, 2010 4:45 PM

## 2010-06-28 NOTE — Assessment & Plan Note (Signed)
Summary: CPX // RS   Vital Signs:  Patient profile:   60 year old female Height:      67.75 inches Weight:      198 pounds BMI:     30.44 Temp:     99.0 degrees F oral Pulse rate:   80 / minute Pulse rhythm:   regular Resp:     12 per minute BP sitting:   120 / 72  (left arm) Cuff size:   regular  Vitals Entered By: Sid Falcon LPN (July 23, 2009 8:27 AM)  Nutrition Counseling: Patient's BMI is greater than 25 and therefore counseled on weight management options. CC: CPX, Lipid Management   History of Present Illness: Here for CPE.  PMH reviewed .  Recent kidney stone problems, followed by urology.  Compliant with all meds.  Repeat Mammogram scheduled.  Colonoscopy repeat scheduled.  Last tetanus > 10 years.  Not exercising.  Hx TAH for benign disease, so no pap indicated.  Separate problem of freq, daily postnasal drip symptoms.  ON Singulair and has used nasal steroids in past without improvement.  No purulent d/c or fever.  Freq associated bifrontal headaches.  Hypothyroidism and recent labs reveal thyroid sl over-replaced.  No weight loss, palpitation, tachycardia, or diarrhea.  C/O difficulty losing weight but not consistently exercising.  No prior formal nutrition counseling.  Lipid Management History:      Positive NCEP/ATP III risk factors include female age 59 years old or older and hypertension.  Negative NCEP/ATP III risk factors include non-diabetic, no family history for ischemic heart disease, non-tobacco-user status, no ASHD (atherosclerotic heart disease), no prior stroke/TIA, no peripheral vascular disease, and no history of aortic aneurysm.    Preventive Screening-Counseling & Management  Alcohol-Tobacco     Smoking Status: quit  Allergies: 1)  ! Provigil (Modafinil)  Past History:  Past Surgical History: Last updated: 10/06/2008 Hysterectomy 1986 Thyroid tumor removed 1982 C-Section 1983 Rotator cuff 2005 Kidney stone rempoval 2000, 2009,  2010  Family History: Last updated: 10/06/2008 Family History of Arthritis Family history of emotional problems  Social History: Last updated: 10/06/2008 Occupation:  Marine scientist Married Alcohol use-no Previous smoker  Risk Factors: Smoking Status: quit (07/23/2009)  Past Medical History: Arthritis Asthma Thyroid cancer  Depression Diverticulitis Hyperlipidemia Hypertension Kidney stones Hypothyroidism PMH-FH-SH reviewed for relevance  Social History: Smoking Status:  quit  Review of Systems  The patient denies anorexia, fever, weight loss, vision loss, decreased hearing, hoarseness, chest pain, syncope, dyspnea on exertion, peripheral edema, prolonged cough, hemoptysis, abdominal pain, melena, hematochezia, severe indigestion/heartburn, hematuria, muscle weakness, suspicious skin lesions, depression, enlarged lymph nodes, and breast masses.         freq rhinitis symptoms not relieved with Singulair.   No prior relief with nasal steroids.  Freq bifrontal headaches.  Difficulty losing weight.  Physical Exam  General:  Well-developed,well-nourished,in no acute distress; alert,appropriate and cooperative throughout examination Head:  Normocephalic and atraumatic without obvious abnormalities. No apparent alopecia or balding. Eyes:  pupils equal, pupils round, and pupils reactive to light.   Ears:  External ear exam shows no significant lesions or deformities.  Otoscopic examination reveals clear canals, tympanic membranes are intact bilaterally without bulging, retraction, inflammation or discharge. Hearing is grossly normal bilaterally. Nose:  External nasal examination shows no deformity or inflammation. Nasal mucosa are pink and moist without lesions or exudates. Mouth:  Oral mucosa and oropharynx without lesions or exudates.  Teeth in good repair. Neck:  No deformities, masses, or tenderness  noted. Breasts:  No mass, nodules, thickening, tenderness, bulging,  retraction, inflamation, nipple discharge or skin changes noted.   Lungs:  Normal respiratory effort, chest expands symmetrically. Lungs are clear to auscultation, no crackles or wheezes. Heart:  Normal rate and regular rhythm. S1 and S2 normal without gallop, murmur, click, rub or other extra sounds. Abdomen:  Bowel sounds positive,abdomen soft and non-tender without masses, organomegaly or hernias noted. Rectal:  deferred sec scheduled colonoscopy Genitalia:  deferred sec hysterectomy Msk:  No deformity or scoliosis noted of thoracic or lumbar spine.   Extremities:  No clubbing, cyanosis, edema, or deformity noted with normal full range of motion of all joints.   Neurologic:  alert & oriented X3, cranial nerves II-XII intact, and gait normal.   Skin:  Intact without suspicious lesions or rashes Cervical Nodes:  No lymphadenopathy noted Psych:  Cognition and judgment appear intact. Alert and cooperative with normal attention span and concentration. No apparent delusions, illusions, hallucinations   Impression & Recommendations:  Problem # 1:  Preventive Health Care (ICD-V70.0) labs reviewed with pt.  Mildly elev triglycerides o/wOK.  Tdap booster given.  Discussed importance of regular exercise.  Problem # 2:  ALLERGIC RHINITIS (ICD-477.9) try astelin Her updated medication list for this problem includes:    Claritin 10 Mg Caps (Loratadine) ..... Once daily    Astelin 137 Mcg/spray Soln (Azelastine hcl) .Marland Kitchen... 2 sprays per nostril two times a day prn  Problem # 3:  HYPERLIPIDEMIA (ICD-272.4)  Orders: Nutrition Referral (Nutrition)  Problem # 4:  HYPOTHYROIDISM (ICD-244.9) overreplaced .  Reduce Synthroid and recheck 3 months. Her updated medication list for this problem includes:    Synthroid 112 Mcg Tabs (Levothyroxine sodium) ..... One by mouth once daily  Complete Medication List: 1)  Cozaar 50 Mg Tabs (Losartan potassium) .... Once daily 2)  Mobic 15 Mg Tabs (Meloxicam) ....  One tab daily 3)  Zoloft 100 Mg Tabs (Sertraline hcl) .... Once daily 4)  Ambien 10 Mg Tabs (Zolpidem tartrate) .... Once daily 5)  Singulair 10 Mg Tabs (Montelukast sodium) .... Once daily 6)  Vitamin D 1000 Unit Caps (Cholecalciferol) .... 2 tabs daily, on hold 7)  Womens 50+ Multi Vitamin/min Tabs (Multiple vitamins-minerals) .... Once daily, on hold 8)  Claritin 10 Mg Caps (Loratadine) .... Once daily 9)  Proventil Hfa 108 (90 Base) Mcg/act Aers (Albuterol sulfate) .... As needed 10)  Synthroid 112 Mcg Tabs (Levothyroxine sodium) .... One by mouth once daily 11)  Urocit-k 15 15 Meq (1620 Mg) Cr-tabs (Potassium citrate) .... One tab two times a day 12)  Astelin 137 Mcg/spray Soln (Azelastine hcl) .... 2 sprays per nostril two times a day prn  Other Orders: Tdap => 57yrs IM (66440) Admin 1st Vaccine (34742)  Lipid Assessment/Plan:      Based on NCEP/ATP III, the patient's risk factor category is "0-1 risk factors".  The patient's lipid goals are as follows: Total cholesterol goal is 200; LDL cholesterol goal is 130; HDL cholesterol goal is 40; Triglyceride goal is 150.     Patient Instructions: 1)  It is important that you exercise reguarly at least 20 minutes 5 times a week. If you develop chest pain, have severe difficulty breathing, or feel very tired, stop exercising immediately and seek medical attention.  2)  You need to lose weight. Consider a lower calorie diet and regular exercise.  3)  Reduce sugars and starches 4)  Please schedule a follow-up appointment in 3 months .  5)  TSH prior to visit ICD-9 : 244.9 6)  Consider supplement with flaxseed oil capsules to help reduce triglycerides. Prescriptions: ASTELIN 137 MCG/SPRAY SOLN (AZELASTINE HCL) 2 sprays per nostril two times a day prn  #1 x 11   Entered and Authorized by:   Evelena Peat MD   Signed by:   Evelena Peat MD on 07/23/2009   Method used:   Electronically to        [facility] [phone]* (retail)        [address]       [place], [place]  [address]       Ph: [phone]       Fax: [phone]   RxID:   [id number] SYNTHROID 112 MCG TABS (LEVOTHYROXINE SODIUM) one by mouth once daily Brand medically necessary #30 x 11   Entered and Authorized by:   Evelena Peat MD   Signed by:   Evelena Peat MD on 07/23/2009   Method used:   Electronically to        [facility] [address] [phone]* (retail)       [address]       [place], [place]  [address]       Ph: [phone]       Fax: [phone]   RxID:   [id number]    Immunizations Administered:  Tetanus Vaccine:    Vaccine Type: Tdap    Site: left deltoid    Mfr: GlaxoSmithKline    Dose: 0.5 ml    Route: IM    Given by: Sid Falcon LPN    Exp. Date: 07/24/2011    Lot #: [id number]

## 2010-06-28 NOTE — Assessment & Plan Note (Signed)
Summary: SYMPTOMS OF DIVERTIC/PS   Vital Signs:  Patient profile:   60 year old female Weight:      200 pounds Temp:     98.8 degrees F oral BP sitting:   110 / 78  (left arm) Cuff size:   regular  Vitals Entered By: Sid Falcon LPN (March 28, 2010 3:53 PM)  History of Present Illness: long history of diverticulitis. Recurrent symptoms this past weekend 2 days ago.  Location left lower quadrant pain. Deep achy pain. Worse with movement. No associated fever, nausea, vomiting, or stool changes. No alleviating factors.  Moderate severity of pain.  Had complicated episode of diverticulits within past year with ? of microperforation transverse colon.  Also hx hypothyroidism and needs f/u TSH soon.  Compliant with therapy.  No excessive fatigue.  Allergies: 1)  ! Provigil (Modafinil)  Past History:  Past Medical History: Last updated: 07/23/2009 Arthritis Asthma Thyroid cancer  Depression Diverticulitis Hyperlipidemia Hypertension Kidney stones Hypothyroidism  Social History: Last updated: 10/06/2008 Occupation:  Marine scientist Married Alcohol use-no Previous smoker PMH reviewed for relevance, SH/Risk Factors reviewed for relevance  Review of Systems       The patient complains of abdominal pain.  The patient denies anorexia, fever, weight loss, chest pain, melena, hematochezia, severe indigestion/heartburn, hematuria, and incontinence.    Physical Exam  General:  Well-developed,well-nourished,in no acute distress; alert,appropriate and cooperative throughout examination Mouth:  Oral mucosa and oropharynx without lesions or exudates.  Teeth in good repair. Neck:  No deformities, masses, or tenderness noted. Lungs:  Normal respiratory effort, chest expands symmetrically. Lungs are clear to auscultation, no crackles or wheezes. Heart:  Normal rate and regular rhythm. S1 and S2 normal without gallop, murmur, click, rub or other extra sounds. Abdomen:  normal  bowel sounds. Nondistended. Tender to palpation left lower quadrant. No guarding or rebound.   Impression & Recommendations:  Problem # 1:  DIVERTICULITIS, ACUTE (ICD-562.11) Assessment Deteriorated Flagyl and Cipro.  Problem # 2:  HYPOTHYROIDISM (ICD-244.9) pt will return for TSH soon. Her updated medication list for this problem includes:    Synthroid 125 Mcg Tabs (Levothyroxine sodium) .Marland Kitchen... 1 by mouth once daily  Complete Medication List: 1)  Cozaar 50 Mg Tabs (Losartan potassium) .... Once daily 2)  Mobic 15 Mg Tabs (Meloxicam) .... One tab daily 3)  Zoloft 100 Mg Tabs (Sertraline hcl) .... Once daily 4)  Ambien 10 Mg Tabs (Zolpidem tartrate) .... Once daily 5)  Singulair 10 Mg Tabs (Montelukast sodium) .... Once daily 6)  Vitamin D 1000 Unit Caps (Cholecalciferol) .... 2 tabs daily, on hold 7)  Womens 50+ Multi Vitamin/min Tabs (Multiple vitamins-minerals) .... Once daily, on hold 8)  Claritin 10 Mg Caps (Loratadine) .... Once daily 9)  Proventil Hfa 108 (90 Base) Mcg/act Aers (Albuterol sulfate) .... As needed 10)  Urocit-k 15 15 Meq (1620 Mg) Cr-tabs (Potassium citrate) .... One tab two times a day 11)  Flaxseed Oil 1000 Mg Caps (Flaxseed (linseed)) .... 2 daily 12)  Oxycodone-acetaminophen 5-325 Mg Tabs (Oxycodone-acetaminophen) .... As needed pain 13)  Synthroid 125 Mcg Tabs (Levothyroxine sodium) .Marland Kitchen.. 1 by mouth once daily 14)  Wellbutrin Xl 150 Mg Xr24h-tab (Bupropion hcl) .... One by mouth once daily 15)  Ciprofloxacin Hcl 500 Mg Tabs (Ciprofloxacin hcl) .... One by mouth two times a day for 10 days 16)  Metronidazole 500 Mg Tabs (Metronidazole) .... One by mouth two times a day for 10 days  Patient Instructions: 1)  Follow up promptly for  any increased fever, vomiting, or any worsening abdominal pain Prescriptions: METRONIDAZOLE 500 MG TABS (METRONIDAZOLE) one by mouth two times a day for 10 days  #20 x 0   Entered and Authorized by:   Evelena Peat MD   Signed  by:   Evelena Peat MD on 03/28/2010   Method used:   Electronically to        [facility]. [phone]* (retail)       [phone]       [place], [place]  [address]       Ph: [phone]       Fax: [phone]   RxID:   [id number] CIPROFLOXACIN HCL 500 MG TABS (CIPROFLOXACIN HCL) one by mouth two times a day for 10 days  #20 x 0   Entered and Authorized by:   Evelena Peat MD   Signed by:   Evelena Peat MD on 03/28/2010   Method used:   Electronically to        [facility]. [phone]* (retail)       [phone]       [place], [place]  [address]       Ph: [phone]       Fax: [phone]   RxID:   [id number]    Orders Added: 1)  Est. Patient Level IV [35573]   Immunization History:  Influenza Immunization History:    Influenza:  historical (02/28/2010)   Immunization History:  Influenza Immunization History:    Influenza:  Historical (02/28/2010)

## 2010-06-28 NOTE — Letter (Signed)
Summary: [facility]  [facility]   Imported By: Maryln Gottron 06/28/2009 15:20:35  _____________________________________________________________________  External Attachment:    Type:   Image     Comment:   External Document

## 2010-06-28 NOTE — Procedures (Signed)
Summary: Colonoscopy  Patient: [name] Note: All result statuses are Final unless otherwise noted.  Tests: (1) Colonoscopy (COL)   COL Colonoscopy           DONE     [facility]     [address]     [place], [place]  [address]           COLONOSCOPY PROCEDURE REPORT           PATIENT:  Misty, Ferrell  MR#:  [id number]     BIRTHDATE:  January 07, 1951, 58 yrs. old  GENDER:  female     ENDOSCOPIST:  Wilhemina Bonito. Eda Keys, MD     REF. BY:  Surveillance Program Recall,     PROCEDURE DATE:  08/26/2009     PROCEDURE:  Surveillance Colonoscopy     ASA CLASS:  Class II     INDICATIONS:  history of pre-cancerous (adenomatous) colon polyps     ; small adenoma 06-2004     MEDICATIONS:   Fentanyl 125 mcg IV, Benadryl 25 mg IV, Versed 12     mg IV           DESCRIPTION OF PROCEDURE:   After the risks benefits and     alternatives of the procedure were thoroughly explained, informed     consent was obtained.  Digital rectal exam was performed and     revealed no abnormalities.   The LB [id number] endoscope     was introduced through the anus and advanced to the cecum, which     was identified by both the appendix and ileocecal valve, limited     by diverticulosis, severe. Time to cecum = 3:01 min.   The quality     of the prep was excellent, using MoviPrep.  The instrument was     then slowly withdrawn (time = 8:19 min) as the colon was fully     examined.     <<PROCEDUREIMAGES>>           FINDINGS:  Severe diverticulosis was found throughout the colon.     No polyps or cancers were seen.   Retroflexed views in the rectum     revealed internal hemorrhoids.    The scope was then withdrawn     from the patient and the procedure completed.           COMPLICATIONS:  None     ENDOSCOPIC IMPRESSION:     1) Severe diverticulosis throughout the colon     2) No polyps or cancers     3) Small Internal hemorrhoids           RECOMMENDATIONS:     1) Follow up colonoscopy in 5  years           ______________________________     Wilhemina Bonito. Eda Keys, MD           CC:  Evelena Peat, MD;The Patient           n.     Rosalie DoctorWilhemina Bonito. Eda Keys at 08/26/2009 12:17 PM           Ella Bodo, [id number]  Note: An exclamation mark (!) indicates a result that was not dispersed into the flowsheet. Document Creation Date: 08/26/2009 12:17 PM _______________________________________________________________________  (1) Order result status: Final Collection or observation date-time: 08/26/2009 12:10 Requested date-time:  Receipt date-time:  Reported date-time:  Referring Physician:   Ordering Physician: Fransico Setters [phone]) Specimen  Source:  Source: Launa Grill Order Number: [id number] Lab site:   Appended Document: Colonoscopy    Clinical Lists Changes  Observations: Added new observation of COLONNXTDUE: 07/2014 (08/26/2009 12:53)

## 2010-06-28 NOTE — Assessment & Plan Note (Signed)
Summary: 2 week fup//ccm   Vital Signs:  Patient profile:   60 year old female Temp:     97.8 degrees F oral BP sitting:   120 / 84  (left arm) Cuff size:   regular  Vitals Entered By: Sid Falcon LPN (May 03, 2010 11:45 AM)  History of Present Illness: Patient seen for followup recent wrist injury. We had some suspicion of possible navicular injury with definite tenderness over her navicular. X-rays revealed no acute fracture. She has been using thumb spica splint regularly. Not aware of any wrist pain or hand pain at this time.  Wrist pain subsided after about one week.  No forearm or arm pain.  Allergies: 1)  ! Provigil (Modafinil)  Physical Exam  General:  Well-developed,well-nourished,in no acute distress; alert,appropriate and cooperative throughout examination Neck:  No deformities, masses, or tenderness noted. Lungs:  Normal respiratory effort, chest expands symmetrically. Lungs are clear to auscultation, no crackles or wheezes. Heart:  Normal rate and regular rhythm. S1 and S2 normal without gallop, murmur, click, rub or other extra sounds. Extremities:  right wrist exam reveals full range of motion. No bony tenderness in the wrist or hand. Full strength with handgrip on the right. Specifically, no navicular tenderness Neurologic:  strength normal in all extremities and sensation intact to light touch.   Skin:  Intact without suspicious lesions or rashes Cervical Nodes:  No lymphadenopathy noted   Impression & Recommendations:  Problem # 1:  WRIST PAIN (ICD-719.43) Assessment Improved she has no tenderness today so navicular fracture extremely unlikely. No further imaging required at this time. At this point she will leave off thumb spica splint  Complete Medication List: 1)  Cozaar 50 Mg Tabs (Losartan potassium) .... Once daily 2)  Mobic 15 Mg Tabs (Meloxicam) .... One tab daily 3)  Zoloft 100 Mg Tabs (Sertraline hcl) .... Once daily 4)  Ambien 10 Mg Tabs  (Zolpidem tartrate) .... Once daily 5)  Singulair 10 Mg Tabs (Montelukast sodium) .... Once daily 6)  Vitamin D 1000 Unit Caps (Cholecalciferol) .... 2 tabs daily, on hold 7)  Womens 50+ Multi Vitamin/min Tabs (Multiple vitamins-minerals) .... Once daily, on hold 8)  Claritin 10 Mg Caps (Loratadine) .... Once daily 9)  Proventil Hfa 108 (90 Base) Mcg/act Aers (Albuterol sulfate) .... As needed 10)  Urocit-k 15 15 Meq (1620 Mg) Cr-tabs (Potassium citrate) .... One tab two times a day 11)  Flaxseed Oil 1000 Mg Caps (Flaxseed (linseed)) .... 2 daily 12)  Oxycodone-acetaminophen 5-325 Mg Tabs (Oxycodone-acetaminophen) .... As needed pain 13)  Synthroid 137 Mcg Tabs (Levothyroxine sodium) .... Once daily daw, brand only 14)  Wellbutrin Xl 150 Mg Xr24h-tab (Bupropion hcl) .... One by mouth once daily   Orders Added: 1)  Est. Patient Level III [16109]

## 2010-06-28 NOTE — Progress Notes (Signed)
Summary: [facility] referral request  ---- Converted from flag ---- ---- 08/10/2009 9:21 AM, Corky Mull wrote: Pt just called and asked for a referral to [facility] for a screening colonoscopy. If approved, please send me an order for this new referral.   Thanks. ------------------------------

## 2010-06-28 NOTE — Progress Notes (Signed)
Summary: REQ FOR REFERRAL ORTHO, Dr Rennis Chris  Phone Note Call from Patient   Caller: Patient [phone] Reason for Call: Talk to Nurse, Talk to Doctor Summary of Call: Pt called to advise that she needs a referral sheet / info faxed to [facility] (Dr Rennis Chris) at [phone]... Pt has appt w/ Dr Rennis Chris ref to L knee / R shoulder pain on Wed. 08/18/2009.... Pt saw Dr Caryl Never about same last month.... Pt adv that her insurance req a referral. Initial call taken by: Debbra Riding,  August 16, 2009 3:13 PM  Follow-up for Phone Call        OK to refer to Dr Rennis Chris. Follow-up by: Evelena Peat MD,  August 16, 2009 4:04 PM  Additional Follow-up for Phone Call Additional follow up Details #1::        Pt informed referral and medical records Additional Follow-up by: Sid Falcon LPN,  August 17, 2009 9:19 AM

## 2010-06-30 NOTE — Progress Notes (Signed)
Summary: Pt waiting a pharmacy for refill of pain med  Phone Note Call from Patient Call back at [phone]   Caller: Spouse-Chuck Summary of Call: Pt said that she needs her pain med called in to [facility] at [place]. Pls call in asap. Pt waiting at the pharmacy.  Initial call taken by: Lucy Antigua,  May 19, 2010 11:36 AM  Follow-up for Phone Call        try hydrocodone APAP 5/325 1-2 by mouth q 6 hours as needed pain #20 with no refills.. Follow-up by: Evelena Peat MD,  May 19, 2010 1:01 PM  Additional Follow-up for Phone Call Additional follow up Details #1::        Rx called in, pt informed Additional Follow-up by: Sid Falcon LPN,  May 19, 2010 3:02 PM

## 2010-06-30 NOTE — Assessment & Plan Note (Signed)
Summary: diverticulitis flare up/cjr   Vital Signs:  Patient profile:   60 year old female Temp:     98.4 degrees F oral BP sitting:   120 / 80  (left arm) Cuff size:   regular  Vitals Entered By: Sid Falcon LPN (May 19, 2010 10:54 AM)  History of Present Illness: One day hx of recurrent LLQ abd pain. Hx multiple diverticulitis flares in past. No fever.  Slight decrease in appetite. Pain is moderate and worse to touch or movement.  Had left over Flagyl and started on this yesterday.  Allergies: 1)  ! Provigil (Modafinil)  Past History:  Past Medical History: Last updated: 07/23/2009 Arthritis Asthma Thyroid cancer  Depression Diverticulitis Hyperlipidemia Hypertension Kidney stones Hypothyroidism PMH reviewed for relevance  Physical Exam  General:  Well-developed,well-nourished,in no acute distress; alert,appropriate and cooperative throughout examination Mouth:  Oral mucosa and oropharynx without lesions or exudates.  Teeth in good repair. Neck:  No deformities, masses, or tenderness noted. Lungs:  Normal respiratory effort, chest expands symmetrically. Lungs are clear to auscultation, no crackles or wheezes. Heart:  Normal rate and regular rhythm. S1 and S2 normal without gallop, murmur, click, rub or other extra sounds. Abdomen:  soft.  tender LLQ.  no guarding. Skin:  no rashes.     Impression & Recommendations:  Problem # 1:  DIVERTICULITIS, ACUTE (ICD-562.11) Add Cipro 750 mg two times a day for 10 days to the Flagyl.  Complete Medication List: 1)  Cozaar 50 Mg Tabs (Losartan potassium) .... Once daily 2)  Mobic 15 Mg Tabs (Meloxicam) .... One tab daily 3)  Zoloft 100 Mg Tabs (Sertraline hcl) .... Once daily 4)  Ambien 10 Mg Tabs (Zolpidem tartrate) .... Once daily 5)  Singulair 10 Mg Tabs (Montelukast sodium) .... Once daily 6)  Vitamin D 1000 Unit Caps (Cholecalciferol) .... 2 tabs daily, on hold 7)  Womens 50+ Multi Vitamin/min Tabs  (Multiple vitamins-minerals) .... Once daily, on hold 8)  Claritin 10 Mg Caps (Loratadine) .... Once daily 9)  Proventil Hfa 108 (90 Base) Mcg/act Aers (Albuterol sulfate) .... As needed 10)  Urocit-k 15 15 Meq (1620 Mg) Cr-tabs (Potassium citrate) .... One tab two times a day 11)  Flaxseed Oil 1000 Mg Caps (Flaxseed (linseed)) .... 2 daily 12)  Oxycodone-acetaminophen 5-325 Mg Tabs (Oxycodone-acetaminophen) .... As needed pain 13)  Synthroid 137 Mcg Tabs (Levothyroxine sodium) .... Once daily daw, brand only 14)  Wellbutrin Xl 150 Mg Xr24h-tab (Bupropion hcl) .... One by mouth once daily 15)  Ciprofloxacin Hcl 750 Mg Tabs (Ciprofloxacin hcl) .... One by mouth two times a day for 10 days 16)  Hydrocodone-acetaminophen 5-325 Mg Tabs (Hydrocodone-acetaminophen) .... One to two tabs by mouth every 4-6 hours as needed pain  Patient Instructions: 1)  Follow up promptly for any worsening abdominal pain, increased fever, or other problems. Prescriptions: HYDROCODONE-ACETAMINOPHEN 5-325 MG TABS (HYDROCODONE-ACETAMINOPHEN) one to two tabs by mouth every 4-6 hours as needed pain  #20 x 0   Entered by:   Sid Falcon LPN   Authorized by:   Evelena Peat MD   Signed by:   Sid Falcon LPN on 16/02/9603   Method used:   Telephoned to ...       [facility]. [phone]* (retail)       [phone]       [place], [place]  [address]       Ph: [phone]  Fax: [phone]   RxID:   [id number] CIPROFLOXACIN HCL 750 MG TABS (CIPROFLOXACIN HCL) one by mouth two times a day for 10 days  #20 x 0   Entered and Authorized by:   Evelena Peat MD   Signed by:   Evelena Peat MD on 05/19/2010   Method used:   Electronically to        [facility]. [phone]* (retail)       [phone]       [place], [place]  [address]       Ph: [phone]       Fax: [phone]   RxID:   [id number]    Orders Added: 1)  Est.  Patient Level III [41660]

## 2010-07-07 ENCOUNTER — Other Ambulatory Visit: Payer: Self-pay | Admitting: Family Medicine

## 2010-07-07 DIAGNOSIS — E039 Hypothyroidism, unspecified: Secondary | ICD-10-CM

## 2010-07-27 ENCOUNTER — Other Ambulatory Visit: Payer: Self-pay | Admitting: Family Medicine

## 2010-07-27 ENCOUNTER — Other Ambulatory Visit: Payer: Managed Care, Other (non HMO) | Admitting: Family Medicine

## 2010-07-27 DIAGNOSIS — F329 Major depressive disorder, single episode, unspecified: Secondary | ICD-10-CM

## 2010-07-27 DIAGNOSIS — E785 Hyperlipidemia, unspecified: Secondary | ICD-10-CM

## 2010-07-27 DIAGNOSIS — Z0000 Encounter for general adult medical examination without abnormal findings: Secondary | ICD-10-CM

## 2010-07-27 LAB — CBC WITH DIFFERENTIAL/PLATELET
Basophils Absolute: 0 10*3/uL (ref 0.0–0.1)
Basophils Relative: 0.8 % (ref 0.0–3.0)
Eosinophils Absolute: 0.3 10*3/uL (ref 0.0–0.7)
Lymphocytes Relative: 44.9 % (ref 12.0–46.0)
MCHC: 33.8 g/dL (ref 30.0–36.0)
Neutrophils Relative %: 43.7 % (ref 43.0–77.0)
Platelets: 224 10*3/uL (ref 150.0–400.0)
RBC: 3.99 Mil/uL (ref 3.87–5.11)

## 2010-07-27 LAB — TSH: TSH: 0.43 u[IU]/mL (ref 0.35–5.50)

## 2010-07-27 LAB — BASIC METABOLIC PANEL
BUN: 22 mg/dL (ref 6–23)
CO2: 30 mEq/L (ref 19–32)
Calcium: 10.2 mg/dL (ref 8.4–10.5)
Creatinine, Ser: 0.8 mg/dL (ref 0.4–1.2)

## 2010-07-27 LAB — HEPATIC FUNCTION PANEL
Alkaline Phosphatase: 51 U/L (ref 39–117)
Bilirubin, Direct: 0 mg/dL (ref 0.0–0.3)
Total Bilirubin: 0.4 mg/dL (ref 0.3–1.2)

## 2010-07-27 LAB — LIPID PANEL
HDL: 51.1 mg/dL (ref >39.00)
Total CHOL/HDL Ratio: 4
Triglycerides: 186 mg/dL — ABNORMAL HIGH (ref 0.0–149.0)
VLDL: 37.2 mg/dL (ref 0.0–40.0)

## 2010-07-27 LAB — POCT URINALYSIS DIPSTICK
Glucose, UA: NEGATIVE
Urobilinogen, UA: 0.2

## 2010-07-27 LAB — LDL CHOLESTEROL, DIRECT: Direct LDL: 148.6 mg/dL

## 2010-07-28 ENCOUNTER — Ambulatory Visit: Payer: Self-pay

## 2010-07-30 ENCOUNTER — Encounter: Payer: Self-pay | Admitting: Family Medicine

## 2010-08-02 ENCOUNTER — Other Ambulatory Visit: Payer: Self-pay | Admitting: Family Medicine

## 2010-08-02 DIAGNOSIS — K5792 Diverticulitis of intestine, part unspecified, without perforation or abscess without bleeding: Secondary | ICD-10-CM

## 2010-08-02 MED ORDER — METRONIDAZOLE 500 MG PO TABS: 500.0000 mg | ORAL_TABLET | Freq: Two times a day (BID) | ORAL | Status: AC

## 2010-08-02 MED ORDER — CIPROFLOXACIN HCL 500 MG PO TABS: 500.0000 mg | ORAL_TABLET | Freq: Two times a day (BID) | ORAL | Status: AC

## 2010-08-02 NOTE — Telephone Encounter (Signed)
May refill once for #30.  If she is having suspected repeat flare of diverticulitis, we need to get her back on Cipro 500 mg po bid for 10 days and Flagyl 500 mg po bid for 10 days.

## 2010-08-02 NOTE — Telephone Encounter (Signed)
Opened in error

## 2010-08-02 NOTE — Telephone Encounter (Signed)
Spoke with pt, informed Rx will be sent to her pharmacy [facility] [place], Hydrocodone called into pharmacist

## 2010-08-02 NOTE — Telephone Encounter (Signed)
Pts husband came in and is waiting in lobby. Pt is needing pain med called in for diverticulitis asap this morning. Pt is in bed and can not get out. Pts spouse said that his wife had called and was told that something would be called in this morning.

## 2010-08-02 NOTE — Telephone Encounter (Signed)
Having diverticulitis flare up and would like pain med sent to [facility], [place].... Pt has cpx tomorrow but doesn't want to be in discomfort till then...  Pts # [phone].

## 2010-08-03 ENCOUNTER — Ambulatory Visit
Admission: RE | Admit: 2010-08-03 | Discharge: 2010-08-03 | Disposition: A | Discharge status: Home / Self Care | Payer: Managed Care, Other (non HMO) | Source: Ambulatory Visit | Attending: Family Medicine | Admitting: Family Medicine

## 2010-08-03 ENCOUNTER — Ambulatory Visit (INDEPENDENT_AMBULATORY_CARE_PROVIDER_SITE_OTHER): Payer: Managed Care, Other (non HMO) | Admitting: Family Medicine

## 2010-08-03 ENCOUNTER — Encounter: Payer: Self-pay | Admitting: Family Medicine

## 2010-08-03 DIAGNOSIS — Z1239 Encounter for other screening for malignant neoplasm of breast: Secondary | ICD-10-CM

## 2010-08-03 DIAGNOSIS — E039 Hypothyroidism, unspecified: Secondary | ICD-10-CM

## 2010-08-03 DIAGNOSIS — M719 Bursopathy, unspecified: Secondary | ICD-10-CM

## 2010-08-03 DIAGNOSIS — R062 Wheezing: Secondary | ICD-10-CM

## 2010-08-03 MED ORDER — SYNTHROID 137 MCG PO TABS: 137.0000 ug | ORAL_TABLET | Freq: Every day | ORAL | Status: DC

## 2010-08-03 MED ORDER — ALBUTEROL SULFATE HFA 108 (90 BASE) MCG/ACT IN AERS: 2.0000 | INHALATION_SPRAY | Freq: Four times a day (QID) | RESPIRATORY_TRACT | Status: DC | PRN

## 2010-08-03 NOTE — Patient Instructions (Signed)
Follow up in one month if shoulder no better Work on establishing regular exercise.

## 2010-08-03 NOTE — Progress Notes (Signed)
  Subjective:    Patient ID: [name], female    DOB: 05/09/1951, 59 y.o.   MRN: [id number]  HPI  Patient here for wellness exam and followup medical problems.   She has history of hypothyroidism, hyperlipidemia, depression, hypertension, recurrent acute diverticulitis , kidney stones and chronic fatigue issues. Medications reviewed. Needs refills of her thyroid medication.  Tetanus up-to-date. Colonoscopy last year. Yearly mammograms. Prior hysterectomy so no indication for Pap smears.    patient had recent flareup of acute diverticulitis. Started back antibiotics yesterday and is dramatically improved today. No fever or chills.    right shoulder pains intermittently past several months. No injury. Pain with abduction and internal rotation. Previous injection per orthopedist about one year ago which helped. Mobic not relieving.  No neck pain.   Review of Systems  Constitutional: Positive for fatigue. Negative for fever, activity change and appetite change.  HENT: Negative for hearing loss, ear pain, sore throat and trouble swallowing.   Eyes: Negative for visual disturbance.  Respiratory: Negative for cough and shortness of breath.   Cardiovascular: Negative for chest pain and palpitations.  Gastrointestinal: Negative for abdominal pain, diarrhea, constipation and blood in stool.  Genitourinary: Negative for dysuria and hematuria.  Musculoskeletal: Positive for arthralgias. Negative for myalgias and back pain.  Skin: Negative for rash.  Neurological: Negative for dizziness, syncope and headaches.  Hematological: Negative for adenopathy.  Psychiatric/Behavioral: Negative for confusion and dysphoric mood.       Objective:   Physical Exam  Constitutional: She is oriented to person, place, and time. She appears well-developed and well-nourished. No distress.  HENT:  Head: Normocephalic and atraumatic.  Right Ear: External ear normal.  Left Ear: External ear normal.    Mouth/Throat: No oropharyngeal exudate.  Eyes: Conjunctivae and EOM are normal. Pupils are equal, round, and reactive to light. Right eye exhibits no discharge. Left eye exhibits no discharge.  Neck: Normal range of motion. Neck supple. No thyromegaly present.  Cardiovascular: Normal rate, regular rhythm and normal heart sounds.  Exam reveals no gallop.   No murmur heard. Pulmonary/Chest: Effort normal and breath sounds normal. No respiratory distress. She has no rales.  Abdominal: Soft. Bowel sounds are normal. She exhibits no distension and no mass. There is no tenderness. There is no rebound and no guarding.  Musculoskeletal: She exhibits no edema.  Lymphadenopathy:    She has no cervical adenopathy.  Neurological: She is alert and oriented to person, place, and time. She has normal reflexes.  Skin: Skin is warm and dry. No rash noted.  Psychiatric: She has a normal mood and affect.   right shoulder reveals pain with internal rotation and abduction against resistance. No a.c. Joint tenderness. No bicipital tenderness. No obvious swelling or erythema. No warmth.        Assessment & Plan:   #1 complete physical examination. Health maintenance issues addressed. Mammogram later today #2 hypertension with low blood pressure today. Stop Cozaar at this time #3 mild hyperkalemia. Reassess in one month off Cozaar   #4 hypothyroidism- refilled medication #5 bursitis/tendinitis right shoulder. Discussed risk and benefits of corticosteroid injection patient wished to proceed.  Shoulder with Betadine and using 25-gauge one and 1/2 inch needle injected 1 cc of Depo-Medrol and 1 cc of plain Xylocaine using posterior approach. Patient tolerated well.

## 2010-08-04 ENCOUNTER — Encounter: Payer: Self-pay | Admitting: Family Medicine

## 2010-08-05 ENCOUNTER — Other Ambulatory Visit: Payer: Self-pay | Admitting: Family Medicine

## 2010-08-05 DIAGNOSIS — R928 Other abnormal and inconclusive findings on diagnostic imaging of breast: Secondary | ICD-10-CM

## 2010-08-11 ENCOUNTER — Ambulatory Visit
Admission: RE | Admit: 2010-08-11 | Discharge: 2010-08-11 | Disposition: A | Discharge status: Home / Self Care | Payer: Managed Care, Other (non HMO) | Source: Ambulatory Visit | Attending: Family Medicine | Admitting: Family Medicine

## 2010-08-11 DIAGNOSIS — R928 Other abnormal and inconclusive findings on diagnostic imaging of breast: Secondary | ICD-10-CM

## 2010-08-25 ENCOUNTER — Other Ambulatory Visit: Payer: Self-pay | Admitting: *Deleted

## 2010-08-25 DIAGNOSIS — K5732 Diverticulitis of large intestine without perforation or abscess without bleeding: Secondary | ICD-10-CM

## 2010-08-25 MED ORDER — METRONIDAZOLE 500 MG PO TABS: 500.0000 mg | ORAL_TABLET | Freq: Two times a day (BID) | ORAL | Status: DC

## 2010-08-25 MED ORDER — CIPROFLOXACIN HCL 500 MG PO TABS: 500.0000 mg | ORAL_TABLET | Freq: Two times a day (BID) | ORAL | Status: DC

## 2010-08-25 NOTE — Telephone Encounter (Signed)
Pt has had another flair up of diverticulitis and would like something called into [facility]

## 2010-08-25 NOTE — Telephone Encounter (Signed)
Rx sent, pt informed on home VM

## 2010-08-25 NOTE — Telephone Encounter (Signed)
May call in one refill of Cipro 500 mg po bid for 10 days and metronidazole 500 mg po bid for 10 days.

## 2010-08-25 NOTE — Telephone Encounter (Signed)
Please advise 

## 2010-08-26 ENCOUNTER — Emergency Department (HOSPITAL_COMMUNITY): Payer: Managed Care, Other (non HMO)

## 2010-08-26 ENCOUNTER — Other Ambulatory Visit: Payer: Self-pay | Admitting: Family Medicine

## 2010-08-26 ENCOUNTER — Emergency Department (HOSPITAL_COMMUNITY)
Admission: EM | Admit: 2010-08-26 | Discharge: 2010-08-26 | Disposition: A | Discharge status: Home / Self Care | Payer: Managed Care, Other (non HMO) | Attending: Emergency Medicine | Admitting: Emergency Medicine

## 2010-08-26 DIAGNOSIS — K7689 Other specified diseases of liver: Secondary | ICD-10-CM | POA: Insufficient documentation

## 2010-08-26 DIAGNOSIS — Z85850 Personal history of malignant neoplasm of thyroid: Secondary | ICD-10-CM | POA: Insufficient documentation

## 2010-08-26 DIAGNOSIS — N200 Calculus of kidney: Secondary | ICD-10-CM | POA: Insufficient documentation

## 2010-08-26 DIAGNOSIS — K5732 Diverticulitis of large intestine without perforation or abscess without bleeding: Secondary | ICD-10-CM | POA: Insufficient documentation

## 2010-08-26 DIAGNOSIS — R1032 Left lower quadrant pain: Secondary | ICD-10-CM | POA: Insufficient documentation

## 2010-08-26 LAB — COMPREHENSIVE METABOLIC PANEL
Albumin: 3.7 g/dL (ref 3.5–5.2)
BUN: 15 mg/dL (ref 6–23)
Creatinine, Ser: 0.75 mg/dL (ref 0.4–1.2)
Total Bilirubin: 0.8 mg/dL (ref 0.3–1.2)
Total Protein: 6.9 g/dL (ref 6.0–8.3)

## 2010-08-26 LAB — DIFFERENTIAL
Basophils Relative: 0 % (ref 0–1)
Eosinophils Absolute: 0.2 10*3/uL (ref 0.0–0.7)
Monocytes Absolute: 0.7 10*3/uL (ref 0.1–1.0)
Monocytes Relative: 7 % (ref 3–12)
Neutrophils Relative %: 73 % (ref 43–77)

## 2010-08-26 LAB — CBC
MCH: 28.7 pg (ref 26.0–34.0)
MCHC: 31.2 g/dL (ref 30.0–36.0)
Platelets: 202 10*3/uL (ref 150–400)

## 2010-08-26 LAB — URINALYSIS, ROUTINE W REFLEX MICROSCOPIC
Ketones, ur: NEGATIVE mg/dL
Nitrite: NEGATIVE
Protein, ur: NEGATIVE mg/dL

## 2010-08-26 MED ORDER — IOHEXOL 300 MG/ML  SOLN
100.0000 mL | Freq: Once | INTRAMUSCULAR | Status: AC | PRN
  Administered 2010-08-26: 100 mL via INTRAVENOUS

## 2010-08-26 NOTE — Telephone Encounter (Signed)
Is this okay to fill? 

## 2010-08-26 NOTE — Telephone Encounter (Signed)
patient  Husband is just calling because patient is in the hospital will possible diverticulitis.  rx called in. fyi

## 2010-08-26 NOTE — Telephone Encounter (Signed)
One refill OK

## 2010-08-30 ENCOUNTER — Other Ambulatory Visit: Payer: Self-pay | Admitting: Family Medicine

## 2010-08-31 NOTE — Telephone Encounter (Signed)
Ok to refill once

## 2010-08-31 NOTE — Telephone Encounter (Signed)
Last filled 3/7

## 2010-09-02 LAB — COMPREHENSIVE METABOLIC PANEL
ALT: 35 U/L (ref 0–35)
AST: 42 U/L — ABNORMAL HIGH (ref 0–37)
AST: 43 U/L — ABNORMAL HIGH (ref 0–37)
Albumin: 3 g/dL — ABNORMAL LOW (ref 3.5–5.2)
Albumin: 3.4 g/dL — ABNORMAL LOW (ref 3.5–5.2)
CO2: 29 mEq/L (ref 19–32)
Chloride: 107 mEq/L (ref 96–112)
Chloride: 108 mEq/L (ref 96–112)
Creatinine, Ser: 0.79 mg/dL (ref 0.4–1.2)
GFR calc Af Amer: >60 mL/min (ref >60)
GFR calc Af Amer: >60 mL/min (ref >60)
GFR calc non Af Amer: >60 mL/min (ref >60)
Sodium: 143 mEq/L (ref 135–145)
Total Bilirubin: 0.1 mg/dL — ABNORMAL LOW (ref 0.3–1.2)
Total Bilirubin: 0.4 mg/dL (ref 0.3–1.2)
Total Protein: 7.1 g/dL (ref 6.0–8.3)

## 2010-09-02 LAB — DIFFERENTIAL
Eosinophils Relative: 6 % — ABNORMAL HIGH (ref 0–5)
Lymphocytes Relative: 35 % (ref 12–46)
Lymphs Abs: 1.9 10*3/uL (ref 0.7–4.0)
Monocytes Absolute: 0.4 10*3/uL (ref 0.1–1.0)
Monocytes Relative: 7 % (ref 3–12)

## 2010-09-02 LAB — CBC
HCT: 32.5 % — ABNORMAL LOW (ref 36.0–46.0)
MCV: 88.7 fL (ref 78.0–100.0)
Platelets: 255 10*3/uL (ref 150–400)
Platelets: 282 10*3/uL (ref 150–400)
Platelets: 284 10*3/uL (ref 150–400)
RBC: 3.58 MIL/uL — ABNORMAL LOW (ref 3.87–5.11)
RDW: 14.1 % (ref 11.5–15.5)
WBC: 5.6 10*3/uL (ref 4.0–10.5)
WBC: 5.9 10*3/uL (ref 4.0–10.5)
WBC: 6.7 10*3/uL (ref 4.0–10.5)

## 2010-09-02 LAB — BASIC METABOLIC PANEL
BUN: 9 mg/dL (ref 6–23)
Calcium: 9.6 mg/dL (ref 8.4–10.5)
Creatinine, Ser: 0.73 mg/dL (ref 0.4–1.2)
GFR calc non Af Amer: >60 mL/min (ref >60)
Potassium: 3.7 mEq/L (ref 3.5–5.1)

## 2010-09-02 LAB — HEMOCCULT GUIAC POC 1CARD (OFFICE): Fecal Occult Bld: POSITIVE

## 2010-09-02 LAB — LIPID PANEL
HDL: 35 mg/dL — ABNORMAL LOW (ref >39)
LDL Cholesterol: 130 mg/dL — ABNORMAL HIGH (ref 0–99)
Triglycerides: 136 mg/dL (ref <150)
VLDL: 27 mg/dL (ref 0–40)

## 2010-09-02 LAB — CULTURE, BLOOD (ROUTINE X 2): Culture: NO GROWTH

## 2010-09-02 LAB — TSH: TSH: 1.78 u[IU]/mL (ref 0.350–4.500)

## 2010-09-05 ENCOUNTER — Other Ambulatory Visit: Payer: Self-pay | Admitting: Family Medicine

## 2010-09-06 NOTE — Telephone Encounter (Signed)
Last filled 08/26/10  #20 with 0 refills

## 2010-09-07 NOTE — Telephone Encounter (Signed)
May refill once but should not be taking regularly.

## 2010-09-26 ENCOUNTER — Other Ambulatory Visit: Payer: Self-pay | Admitting: Family Medicine

## 2010-09-26 NOTE — Telephone Encounter (Signed)
Last filled 08/30/10 #30 with 0 refills

## 2010-09-28 NOTE — Telephone Encounter (Signed)
May refill for 6 months 

## 2010-09-28 NOTE — Telephone Encounter (Signed)
Refill request , please advise

## 2010-10-11 NOTE — Op Note (Signed)
NAMEARABEL, BARCENAS           ACCOUNT NO.:  [id number]   MEDICAL RECORD NO.:  [id number]          PATIENT TYPE:  INP   LOCATION:  0101                         FACILITY:  [facility]   PHYSICIAN:  Heloise Purpura, MD      DATE OF BIRTH:  [birth year]   DATE OF PROCEDURE:  DATE OF DISCHARGE:                               OPERATIVE REPORT   PREOPERATIVE DIAGNOSES:  1. Obstructing left ureteral calculi.  2. Fever.   POSTOPERATIVE DIAGNOSIS:  1. Obstructing left ureteral calculi.  2. Fever.   PROCEDURES:  1. Cystoscopy.  2. Left ureteral stent placement.   SURGEON:  Heloise Purpura, MD.   ANESTHESIA:  General.   COMPLICATIONS:  None.   INDICATION:  Ms. [name] is a 60 year old with a history of  nephrolithiasis who began having left-sided flank pain approximately 2  days ago.  She developed worsening pain associated with fever this  morning and presented to the emergency department.  She underwent a CT  scan which demonstrated two the calculi at the level of the UPJ with  proximal hydronephrosis.  She was noted to be febrile with a temperature  of 102.3 degrees Fahrenheit on evaluation.  After discussing these  findings and options with the patient, she elected to proceed with  cystoscopy.  A left ureteral stent placement.  Potential  risks/complications and alternatives were discussed with the patient and  she consented.   DESCRIPTION OF PROCEDURE:  The patient was taken to the operating room  and a general anesthetic was administered.  She was given preoperative  antibiotics, placed in the dorsal lithotomy position, prepped and draped  in the usual sterile fashion.  Next, preoperative time-out was  performed.  A cystourethroscopy was then performed which demonstrated  the ureteral orifices to be in normal anatomic position.  There were no  evidence of any bladder tumors or other mucosal pathology within the  bladder.  Attention turned to the left ureteral orifice and a 0.038  sensor guidewire was advanced into the left ureteral orifice and up into  the renal pelvis without difficulty.  Therefore, bypassing the stone.  A  6 x 24 double-J ureteral stent was advanced over the wire using  Seldinger technique and positioned under fluoroscopic and cystoscopic  guidance.  The wire was removed with a good curl noted in the renal  pelvis, as well as in the bladder.  The patient tolerated the procedure  well without complications.  A 16-French Foley catheter was inserted  into the bladder at the end of the procedure.  She was able to be  awakened and transferred to the recovery room in satisfactory condition.           ______________________________  Heloise Purpura, MD  Electronically Signed     LB/MEDQ  D:  11/12/2006  T:  11/12/2006  Job:  [id number]

## 2010-10-11 NOTE — Consult Note (Signed)
[name], [name]           ACCOUNT NO.:  [id number]   MEDICAL RECORD NO.:  [id number]          PATIENT TYPE:  INP   LOCATION:  0101                         FACILITY:  [facility]   PHYSICIAN:  Heloise Purpura, MD      DATE OF BIRTH:  1951-01-27   DATE OF CONSULTATION:  11/12/2006  DATE OF DISCHARGE:                                 CONSULTATION   REASON FOR CONSULTATION:  1. Obstructing left ureteral calculus.  2. Fever.   HISTORY:  Ms. Lawhorn is a 60 year old female, with a history of  urolithiasis, who is followed by Dr. Annabell Howells.  Two days ago, she began  having left-sided flank pain which increased in intensity yesterday.  She had some radiation to her left upper quadrant.  She developed fever  this morning to 101.5 and presented to the emergency room.  She denies  any hematuria or dysuria.  She has had some minimal nausea, but no  vomiting.  A CT scan was performed in the emergency room which  demonstrated two left proximal ureteral calculi along with left-sided  hydronephrosis. She also had multiple non-obstructing bilateral renal  calculi.   PAST MEDICAL HISTORY:  1. Hypothyroidism.  2. Hypertension.  3. Depression   PAST SURGICAL HISTORY:  1. Hysterectomy.  2. Shock wave lithotripsy.  3. Ureteroscopic stone removal.   MEDICATIONS:  1. Synthroid.  2. Cozaar.  3. Zoloft.  4. Ambien.   ALLERGIES:  PROVIGIL.   FAMILY HISTORY:  No history of the urolithiasis.   SOCIAL HISTORY:  She denies tobacco or alcohol use.   REVIEW OF SYSTEMS:  Positive for fever.  Otherwise negative except for  as in history of present illness.  All other systems reviewed otherwise  negative.   PHYSICAL EXAMINATION:  VITAL SIGNS:  Temperature 102.3, blood pressure  158/76, heart rate 106, respirations 16.  CONSTITUTIONAL:  Alert and oriented.  In no acute distress.  HEENT:  Normocephalic, atraumatic.  NECK:  Supple without lymphadenopathy.  CARDIOVASCULAR:  Regular rate and rhythm  without obvious murmurs.  LUNGS:  Clear bilaterally.  ABDOMEN:  Soft, nondistended with minimal left-sided abdominal  tenderness.  No rebound tenderness or guarding.  BACK:  Minimal left CVA tenderness.  EXTREMITIES:  No edema.  PSYCHIATRIC:  Normal mood and affect   LABORATORY DATA:  Urinalysis is a leukocyte esterase positive, nitrite  negative with too numerous to count white blood cells.   IMAGING:  The patient's CT scan was independently reviewed and  demonstrates two calculi at the left UPJ with left-sided hydronephrosis  proximally.  There are multiple bilateral renal calculi that appear to  be nonobstructing.   IMPRESSION:  Left ureteral obstructing calculi with fever.   PLAN:  The patient is being administered IV ciprofloxacin and a urine  culture has been sent.  She will be taken to the operating room  emergently for cystoscopy and left ureteral stent placement.  Potential  risks, complications and alternative options were discussed with the  patient.  She then will be admitted to the hospital for hemodynamic  monitoring and antibiotic therapy.  ______________________________  Heloise Purpura, MD  Electronically Signed     LB/MEDQ  D:  11/12/2006  T:  11/12/2006  Job:  [id number]

## 2010-10-11 NOTE — Op Note (Signed)
[name], [name]           ACCOUNT NO.:  [id number]   MEDICAL RECORD NO.:  [id number]          PATIENT TYPE:  AMB   LOCATION:  [facility]                         FACILITY:  [facility]   PHYSICIAN:  Excell Seltzer. Annabell Howells, M.D.    DATE OF BIRTH:  09/27/1950   DATE OF PROCEDURE:  11/20/2006  DATE OF DISCHARGE:                               OPERATIVE REPORT   PROCEDURE:  Cystoscopy with removal of and replacement of left double-J  stent, left ureteroscopic stone extraction with holmium laser tripsy.   PREOPERATIVE DIAGNOSIS:  Left renal stones.   POSTOPERATIVE DIAGNOSIS:  Left renal stones.   SURGEON:  Dr. Bjorn Pippin.   ANESTHESIA:  General.   SPECIMEN:  Stone fragments.   DRAIN:  6-French x 26 cm double-J stent.   COMPLICATIONS:  None.   INDICATIONS:  Ms. Chern is a 60 year old white female with a history  of stones who was recently admitted with an obstructing left UPJ stone  and pyelonephritis.  She was stented, placed on antibiotics, and now  returns for stone removal.   FINDINGS OF PROCEDURE:  The patient was taken operating room where  general anesthetic was induced.  She was placed in lithotomy position.  She was prepped with Betadine solution.  The usual sterile fashion.  Cystoscopy was performed using a 22-French scope and 12-degree lens.  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  The stent was noted exiting the left ureteral orifice.  The stent was grasped with the grasping forceps and pulled back to the  urethral meatus.  A guidewire was then passed through the stent to the  kidney.  The stent was removed and a 12/14 35 cm introducer sheath was  placed over the wire to the kidney.  The wire and inner core were  removed and a 6-French flexible ureteroscope was then passed to the  kidney.  An approximately 6 mm stone was identified.  This was engaged  with the laser and fragmented using the 200 micron fiber set on 5 joules  and 8  Hertz.  Once the stone was adequately fragmented, this stone and  several other small stones were removed with a nitinol basket.  One  large Randall's plaque was plucked off the lower pole calix and removed  as well.  Once the kidney was felt to be adequately free of stones, the  ureteroscope was removed and a guidewire was replaced.  Of note, there  were multiple Randall's plaques, particular in the lower pole of the  kidney.  The cystoscope was then reinserted over the wire and a 6-French  26 cm double-J stent was placed under visual and fluoroscopic guidance.  Once in good position, the wire was removed, leaving good coil in the  kidney and good coil  in the bladder.  The bladder was then drained.  The cystoscope was  removed.  The stent string was left exiting urethra.  It was tied closed  at the meatus and trimmed.  The patient was taken down from lithotomy  position.  Her anesthetic was reversed.  She was moved to the recovery  room  in stable condition.  There were no complications.      Excell Seltzer. Annabell Howells, M.D.  Electronically Signed     JJW/MEDQ  D:  11/20/2006  T:  11/20/2006  Job:  [id number]

## 2010-10-14 ENCOUNTER — Other Ambulatory Visit: Payer: Self-pay | Admitting: Family Medicine

## 2010-10-14 NOTE — Consult Note (Signed)
[name], [name] NO.:  [id number]   MEDICAL RECORD NO.:  [id number]          PATIENT TYPE:  INP   LOCATION:  6524                         FACILITY:  [facility]   PHYSICIAN:  Carole Binning, M.D. LHCDATE OF BIRTH:  [birth year]   DATE OF CONSULTATION:  10/19/2004  DATE OF DISCHARGE:                                   CONSULTATION   PRIMARY MEDICAL DOCTOR:  Evelena Peat, M.D.   PRIMARY CARDIOLOGIST:  Carole Binning, M.D. [facility]   CHIEF COMPLAINT:  Chest pain.   HISTORY OF PRESENT ILLNESS:  [name] is a 60 year old female with no  history of coronary artery disease. She developed chest pain on Monday at  approximately 9 p.m. It was in the left side of her chest. It did not  radiate. At that time she had no shortness of breath, nausea, or  diaphoresis. Her pain continued and on Tuesday her symptoms worsened and her  pain increased to a 7 or 8 out of 10. She had shortness of breath and the  symptoms worsened with deep inspiration. She came to the emergency room and  received sublingual nitroglycerin which decreased, but did not relieve her  pain. She was admitted by primary care. Since admission her symptoms have  returned intermittently for 20-30 minutes at a time. She states she has had  four episodes today. The pain is described as a chest pressure. She  initially felt like her symptoms might have gotten worse with certain arm  movements, but now she feels this is not correct. She has never had this  pain before. Cardiology is asked to evaluate her.   PAST MEDICAL HISTORY:  Significant for hypertension and she was  hyperlipidemic on a lipid profile drawn on this admission, although she has  never been told she was hyperlipidemic before. She has a history of  hypothyroidism secondary to surgery. She has a history of depression and  anxiety. She has a history of osteoarthritis.   PAST SURGICAL HISTORY:  She had thyroid cancer excised. She had shoulder  surgery and a hysterectomy.   ALLERGIES:  No known drug allergies.   CURRENT MEDICATIONS:  1.  Aspirin 325 mg daily.  2.  Synthroid 100 mcg daily.  3.  Cozaar 100 mg daily.  4.  Lexapro 10 mg daily.  5.  Wellbutrin XR 150 mg daily.  6.  Mobic 15 mg daily.  7.  Protonix 40 mg daily.  8.  Lovenox 40 mg subcu daily.   SOCIAL HISTORY:  She lives in [place] with her husband and works as an  Marine scientist. She has no history of tobacco, alcohol, or drug  abuse.   FAMILY HISTORY:  Her mother died at age 59 of COPD. Her father died at age  94 with no heart disease.  She has no heart disease in her siblings and  there is no premature coronary artery disease in any relatives that she  knows of.   REVIEW OF SYSTEMS:  Significant for chest pain as described above. She has  some chronic dyspnea on exertion which she feels is because she is out  of  shape, and this has not changed recently. There is no coughing or wheezing.  She says her depression and anxiety is well controlled on her medication.  She has arthralgias mainly in her bilateral lower extremities. She has rare  reflux symptoms secondary to specific foods, but not more than once or twice  a month. Her TSH is checked regularly by her primary MD. Review of systems  is otherwise negative.   PHYSICAL EXAMINATION:  VITAL SIGNS: Temperature 99.1, blood pressure 130/64,  heart rate 84, respiratory rate 20, O2 saturation on 98% on room air.  GENERAL: She is a well-developed, well-nourished, white female in no acute  distress with an appropriate affect.  HEENT: Her head is normocephalic and atraumatic. Her pupils are equal,  round, and reactive to light and accommodation. Extraocular movements are  intact. Sclerae are clear. Nares without discharge.  NECK: There is no lymphadenopathy, thyromegaly, bruits, or JVD  noted.  CV: Her heart is regular in rate and rhythm with no significant murmurs,  rubs, or gallops noted. Her  distal pulses are  2+ in all four extremities  and no femoral bruits are appreciated.  LUNGS: Clear to auscultation bilaterally.  SKIN: No rashes or lesions are noted.  ABDOMEN: Soft and nontender with active bowel sounds. No hepatosplenomegaly  on palpation.  EXTREMITIES: There is no clubbing, cyanosis, or edema noted.  MUSCULOSKELETAL: There is no joint deformity or effusions and no spine or  CVA tenderness.  NEUROLOGIC: She is alert and oriented times three with cranial nerves II-XII  grossly intact.   CHEST X-RAY:  She has mild cardiomegaly and mild changes of COPD.   EKG:  Sinus rhythm with rate of 95 with diffuse ST-changes, but no old films  available for comparison.   LABORATORY VALUES:  Hemoglobin 12.1, hematocrit 35.4, WBC 5.5, platelet  count 246,000. Total cholesterol of 202, triglycerides 190, HDL 42, LDL 122,  magnesium 2.0. TSH 0.275. D-dimer 0.45.  Point of care markers were negative  times three and CK, MB, and troponin-I are negative times two.   ASSESSMENT/PLAN:  1.  Chest pain:  Her symptoms are not typical. She has had no exertional      symptoms prior to the onset of symptoms on Monday night which started      after a stressful day at work, but without exertion. Her cardiac enzymes      are negative, but symptom relief was obtained with nitroglycerin. She      has had recurrent episodes of pain. Other cardiac enzymes are negative.      I feel that she should be assessed for ischemia in-house and will order      an inpatient Cardiolite in the a.m.  2.  Hyperlipidemia: Dietary changes are recommended. Because of her elevated      triglycerides, will check a CMET to check liver function, renal      function, and blood glucose. If her stress test is positive for      ischemia, consider adding stating to her medication regimen, otherwise      dietary changes. 3.  The patient is otherwise stable and under the excellent care of Dr.      Caryl Never and the [facility]  team.   Dr. Loraine Leriche Pulsipher saw the patient and determined the plan of care.     RB/MEDQ  D:  10/19/2004  T:  10/19/2004  Job:  [id number]

## 2010-10-14 NOTE — Discharge Summary (Signed)
[name], [name]           ACCOUNT NO.:  [id number]   MEDICAL RECORD NO.:  [id number]          PATIENT TYPE:  INP   LOCATION:  1421                         FACILITY:  [facility]   PHYSICIAN:  Excell Seltzer. Annabell Howells, M.D.    DATE OF BIRTH:  May 02, 1951   DATE OF ADMISSION:  11/12/2006  DATE OF DISCHARGE:  11/14/2006                               DISCHARGE SUMMARY   HISTORY:  Briefly, [name] is a 60 year old white female with  history of stones, who had the onset of left flank pain 2 days prior to  admission.  She developed fever to 101 and was found in the emergency  room to have 2 left proximal ureteral stones with left-sided  hydronephrosis and multiple nonobstructing bilateral renal calculi.  She  was admitted for placement of a stent and antibiotic therapy.  For  additional details of the history and physical, please see the dictated  H&P.   HOSPITAL COURSE:  The patient was taken to the operating room on the day  of admission where she underwent cystoscopy with placement of a left  ureteral stent; she tolerated this well.  On the 1st postoperative day,  her symptoms had improved; however, on June 17, her T-max was 102.8 with  stable vital signs.  Her urine culture was pending at that point, but  she remained on Cipro.  A KUB was done on June 17 which revealed  bilateral renal calculi.  The left ureteral/UPJ stone was not well seen.  Her culture at that time had gram-negative rods with pending  sensitivities.  On June 18, she was afebrile, her vital signs were  stable, she was changed from IV to p.o. Cipro and was felt to be ready  for discharge home.  Her sensitivities eventually came back susceptible  to Cipro with a Klebsiella.   DISCHARGE DIAGNOSIS:  Left pyelonephritis with obstructing left ureteral  stones.  There were no complications.   FOLLOWUP:  She will follow up next week for ureteroscopic stone  extraction.   DISCHARGE MEDICATIONS:  Included Vicodin and Cipro, as  well as  resumption of her Synthroid, Cozaar, Zoloft and Ambien.   ALLERGIES:  She has allergies to PROVIGIL.   DISPOSITION:  To home.   CONDITION ON DISCHARGE:  Her condition is improved.   PROGNOSIS:  Good.      Excell Seltzer. Annabell Howells, M.D.  Electronically Signed     JJW/MEDQ  D:  11/29/2006  T:  11/30/2006  Job:  [id number]

## 2010-10-14 NOTE — Discharge Summary (Signed)
[name], [name]           ACCOUNT NO.:  [id number]   MEDICAL RECORD NO.:  [id number]          PATIENT TYPE:  INP   LOCATION:  6524                         FACILITY:  [facility]   PHYSICIAN:  Mallory Shirk, MD     DATE OF BIRTH:  10/25/1950   DATE OF ADMISSION:  10/18/2004  DATE OF DISCHARGE:  10/21/2004                                 DISCHARGE SUMMARY   DISCHARGE DIAGNOSIS:  1.  Chest pain with positive stress test but negative cath.  2.  Depression.  3.  Hypothyroidism.  4.  Anxiety disorder.  5.  Hypertension.   DISCHARGE MEDICATIONS:  1.  Synthroid 100 mcg p.o. daily.  2.  Cozaar 100 mg p.o. daily.  3.  Wellbutrin XR 150 mg p.o. daily.  4.  Mobic 50 mg p.o. daily.  5.  Lexapro 10 mg p.o. daily.  6.  Ambien 10 mg p.o. q.h.s.  7.  Xanax 0.5 mg p.o. p.r.n.  Please note that no changes have been made in the patient's outpatient  medication regimen.   FOLLOW UP:  1.  Dr. Evelena Peat, primary care physician on October 27, 2004, at 10 a.m.,      at which time the patient will review all medications with Dr.      Caryl Never.  2.  Dr. Daisey Must, cardiology, on November 07, 2004, at 10:45 a.m. (Dr.      Pulsipher's phone number [phone]).   HISTORY OF PRESENT ILLNESS:  [name] is a 60 year old Caucasian woman  with a history of hypertension, depression, and anxiety disorder, who  presented to the emergency department of [facility] on Oct 18, 2004, with complaints of chest pain since the night before admission.  The  patient states that she has been stressed out the day prior to admission and  at night started experiencing retrosternal chest pain that was described  more as pressure.  The pain was aggravated by deep breathing but improved  with patient lying still.  No nausea, vomiting, diarrhea, diaphoresis or any  other symptoms.  The pain was not radiating, 8 out of 10 on the pain scale.  The patient called our nurse who told her that this was stress and  anxiety  and recommended she take some Xanax which she did.  This did not relieve the  pain and she came to the emergency room  where she received sublingual  nitroglycerin upon which pain was relieved.  The patient denies any cold  symptoms or sick contacts.  On the day of discharge, the patient states that  the pain was mainly in her left anterior shoulder area.  Of note, the  patient has had a rotator cuff repair in June 2005.  Since then, she has had  several episodes of pain in that area.   PAST MEDICAL HISTORY:  1.  Hypertension.  2.  Depression.  3.  Anxiety disorder.  4.  Hypothyroidism.  5.  Osteoarthritis.  6.  Restless leg syndrome.  7.  Thyroid CA status post surgical excision.  8.  Status post rotator cuff surgery in June 2005.  9.  Status post hysterectomy.   MEDICATIONS ON ADMISSION:  Synthroid 100 mcg p.o. daily, Cozaar 100 mg p.o.  daily, Wellbutrin XR 150 mg p.o. daily, Mobic 50 mg p.o. daily, Lexapro 10  mg p.o. daily, Ambien 10 mg p.o. q.h.s., Xanax 0.5 mg p.o. p.r.n.   ALLERGIES:  No known drug allergies.   PHYSICAL EXAMINATION:  On admission, blood pressure 113/73, pulse 88,  respirations 18, O2 saturations 97% on room air, temperature 98.3.  General:  Middle aged Caucasian woman lying in bed in no acute distress.  HEENT:  Normocephalic, atraumatic, PERRLA, sclerae anicteric.  Neck supple, no JVD,  no lymphadenopathy.  Lungs clear to auscultation bilaterally, no wheezes, no  rales.  Heart:  Normal S1 and S2, regular rate and rhythm, no murmurs,  gallops, and rubs.  Abdomen:  Soft, positive bowel sounds, nontender, no  masses.  Extremities:  No cyanosis, clubbing, and edema.   LABORATORY DATA:  WBC 5.5, hemoglobin 12.1, platelets 246.  PT 13.3, PTT 26,  INR 1.  D-dimer 0.45.  Point of care cardiac enzymes negative.  Urinalysis  reveals small leukocyte esterase, WBCs 3-6, few bacteria.  Chest x-ray shows  mild cardiomegaly, mild COPD changes.  EKG normal  sinus rhythm, no  significant ST-T changes except for mild flattening of T-waves in v4 and v5.   Cardiac catheterization showed no evidence of coronary artery disease.  Cardiolite Persantine test was positive with stress induced inferolateral  apical ischemia seen.  Echocardiogram showed overall left ventricular  systolic function being normal with an LVEDF of 55-65% with no regional wall  motion abnormalities, mild MR was seen.   HOSPITAL COURSE:  Problem 1:  Chest pain.  The patient was admitted to a monitored bed, three  sets of cardiac enzymes were negative.  The patient was seen by cardiology.  A Cardiolite Persantine stress test was positive.  The patient subsequently  underwent cardiac catheterization which showed essentially normal coronary  arteries with no evidence of significant coronary artery disease.  The  patient was recommended to be discharged on Oct 21, 2004, after 2 p.m.   Problem 2:  Depression.  The patient's Lexapro and Albuterol were continued.   Problem 3:  Hypertension.  The patient's blood pressure was well controlled  with Cozaar at the home dose.   Problem 4:  Anxiety disorder.  The patient was given Valium p.r.n. and  Lorazepam p.r.n.  There were no events during her hospital stay.   Problem 5:  Hypothyroidism.  Her Synthroid was resumed.   DISPOSITION:  The patient was discharged in stable condition.  At the time  of discharge, her blood pressure was 125/75 with pulse of 85, respirations  20, saturations 96% on room air.  She was advised to take her medications as  prescribed by primary care physician, keep up her follow up appointments,  and return to the emergency department immediately upon sudden onset of  chest pain, shortness of breath, or other complaints that might need medical  attention.      GDK/MEDQ  D:  10/21/2004  T:  10/21/2004  Job:  [id number]   cc:   Evelena Peat, M.D. [address]  [place]  [place] [address]  Fax: [phone]    Carole Binning, M.D. [facility]

## 2010-10-14 NOTE — Cardiovascular Report (Signed)
[name], [name]           ACCOUNT NO.:  [id number]   MEDICAL RECORD NO.:  [id number]          PATIENT TYPE:  INP   LOCATION:                               FACILITY:  [facility]   PHYSICIAN:  Charlton Haws, M.D.     DATE OF BIRTH:  07-Jan-1951   DATE OF PROCEDURE:  DATE OF DISCHARGE:  10/21/2004                              CARDIAC CATHETERIZATION   INDICATION:  Chest pain, abnormal Cardiolite study suggesting inferior wall  ischemia.   Snake catheterization is done using a 6-French catheter from the right  femoral artery.   Left main coronary artery was normal.   Left anterior descending artery was normal.  First and second diagonal  branch was normal.   Circumflex coronary artery was nondominant and normal.   Right coronary artery was dominant and normal.   RAO VENTRICULOGRAPHY:  RAO ventriculography was normal.  EF was in excess of  60%.  There was no gradient across the aortic valve and no MR.  Aortic  pressure was in the 142/94 range.  LV pressure was 141/6.   IMPRESSION:  The patient would appear to have had a false positive  Cardiolite study.  She has no epicardial coronary disease.  She will be  discharged later today as long as her groin heals well.       ___________________________________________  Charlton Haws, M.D.    PN/MEDQ  D:  10/21/2004  T:  10/21/2004  Job:  [id number]   cc:   Evelena Peat, M.D.  [address]  [place]  [place] [address]  Fax: [phone]

## 2010-10-14 NOTE — H&P (Signed)
NAMEAMBUR, PROVINCE           ACCOUNT NO.:  [id number]   MEDICAL RECORD NO.:  [id number]          PATIENT TYPE:  INP   LOCATION:  1823                         FACILITY:  [facility]   PHYSICIAN:  Lonia Blood, M.D.      DATE OF BIRTH:  [birth year]   DATE OF ADMISSION:  10/18/2004  DATE OF DISCHARGE:                                HISTORY & PHYSICAL   PRIMARY CARE PHYSICIAN:  Evelena Peat, M.D.   PRESENTING COMPLAINT:  Chest pain.   HISTORY OF PRESENT ILLNESS:  This is a 60 year old white female with history  of hypertension, depression, anxiety, who is here secondary to incurring  chest pain since last night.  The patient was apparently stressed out all  yesterday and in the night started experiencing retrosternal chest pain that  she described more as a pressure.  The pain was aggravated by deep breath  but improves with lying still.  Denied any nausea, vomiting, or diarrhea.  Denies any radiation.  She described the pain as 8/10 in nature.  The  patient called her nurse who told her that it was probably stress and  anxiety, so she asked her to take Xanax which she did.  This, however, did  not relieve the chest pain.  Today the chest pain continued and was  constant; hence, she was asked to go to the emergency room.  She is  currently chest pain free after receiving nitroglycerin sublingual.  Denied  any cold symptoms recently or any sick person around her.  Denied any  nausea, vomiting, or diaphoresis, mainly anorexia.   PAST MEDICAL HISTORY:  1.  Hypertension.  2.  Depression and anxiety.  3.  Hypothyroidism.  4.  Osteoarthritis.  5.  History of restless leg syndrome.  6.  History of thyroid cancer status post surgical excision.  7.  Status post shoulder surgery in June 2005.  8.  Status post hysterectomy.   MEDICATIONS:  1.  Synthroid 100 mcg daily.  2.  Cozaar 100 mg daily.  3.  Wellbutrin XR 150 mg daily.  4.  Mobic 50 mg daily.  5.  Lexapro 10 mg daily.  6.   Ambien 10 mg q.h.s.  7.  Xanax 0.5 mg p.r.n.   ALLERGIES:  No known drug allergies.   SOCIAL HISTORY:  The patient is married and lives in [place].  She has  grown children.  Denied any tobacco use or alcohol use.  She is very much  active.   FAMILY HISTORY:  The patient is unaware of her family.  She did not know  anything about her grandparents, but presumed they may have died of some  form of cancer.  Denies any known family history of cardiac disease at this  point.   REVIEW OF SYSTEMS:  A 10-point Review of Systems was essentially normal  except for anorexia and what is in HPI.   PHYSICAL EXAMINATION:  VITAL SIGNS:  The patient is afebrile with  temperature 98.3, blood pressure 113/73, pulse 88, respiratory rate 18, O2  saturation 97% on room air.  HEENT:  PERRLA.  EOMI.  NECK:  Supple.  No JVD, no lymphadenopathy.  RESPIRATORY:  Good air entry bilaterally.  No wheezes or rales.  CARDIOVASCULAR:  Regular rate and rhythm.  ABDOMEN:  Her abdomen is soft, full, nontender, with positive bowel sounds.  EXTREMITIES:  No edema, cyanosis, or clubbing.   LABORATORY DATA:  White count 5.5, hemoglobin 12.1, platelet count 246 with  normal differential.  PT 13.3, INR 1.0, PTT 26.  D-dimer 0.45.  Initial  cardiac enzymes.  UA mainly cloudy with small leukocyte esterase, wbc's 3 to  6, and few bacteria.   Chest x-ray shows mild cardiomegaly and mild changes of COPD.  Her EKG  showed normal sinus rhythm with a rate of 81beats per minute.  Normal  intervals.  No significant ST-T wave changes except for mild flattening of T  waves in V4 and V5.  Questionable prolonged QTC with QTC of 474.   ASSESSMENT:  Therefore, this is a 60 year old female with relatively mild  risks for coronary artery disease, presenting with chest pain, mainly  retrosternal but has refused to go away; however, relieved by nitroglycerin  in the emergency room.  Differential for patient's chest pain includes   possible acute coronary syndrome including angina.  It could also be  pulmonary embolus, although her D-dimer is negative.  Pericarditis is  another possibility especially with worsening chest pain when she has a deep  breath.  Other possibilities are gastrointestinal related like reflux and  esophagitis.  It could be musculoskeletal or purely from her anxiety.  The  patient had some risk factors for cardiac disease including hypertension,  unknown cholesterol status, also unknown family history.   PLAN:  1.  Will admit the patient for 23-hour observation.  2.  Rule her out for myocardial infarction by checking serial cardiac      enzymes.  3.  I will recheck her TSH and fasting lipid panel in the morning.  4.  Will recheck her EKG.  5.  We will check a 2-D echocardiogram especially with her report of      cardiomegaly.  6.  If all her tests come back as normal and she rules out for MI, would      proceed with some risk stratification by scheduling her for either      inpatient or outpatient Cardiolite or possibly catheterization.       LG/MEDQ  D:  10/18/2004  T:  10/18/2004  Job:  [id number]   cc:   Evelena Peat, M.D.  [address]  [place]  [place] [address]  Fax: [phone]

## 2010-10-14 NOTE — Telephone Encounter (Signed)
Last filled 4-9 #20 with 0 refills

## 2010-10-15 NOTE — Telephone Encounter (Signed)
May refill once. 

## 2010-11-07 ENCOUNTER — Telehealth: Payer: Self-pay | Admitting: Family Medicine

## 2010-11-07 MED ORDER — CIPROFLOXACIN HCL 500 MG PO TABS: 500.0000 mg | ORAL_TABLET | Freq: Two times a day (BID) | ORAL | Status: AC

## 2010-11-07 MED ORDER — METRONIDAZOLE 500 MG PO TABS: 500.0000 mg | ORAL_TABLET | Freq: Two times a day (BID) | ORAL | Status: AC

## 2010-11-07 MED ORDER — HYDROCODONE-ACETAMINOPHEN 5-325 MG PO TABS: ORAL_TABLET | ORAL | Status: DC

## 2010-11-07 NOTE — Telephone Encounter (Signed)
Please advise 

## 2010-11-07 NOTE — Telephone Encounter (Signed)
Pt informed Rx called in. 

## 2010-11-07 NOTE — Telephone Encounter (Signed)
Pt is having a diverticulitis flare up and is req that Dr Caryl Never call in some Flagyl, Cipro and Hydrocodone to [facility] at [place]. Pt is req that the nurse call, if med has been called in, or if pt needs ov today.

## 2010-11-07 NOTE — Telephone Encounter (Signed)
May call in one refill of antibiotics.  May refill her hydrocodone 15 tablets.

## 2010-11-10 NOTE — Progress Notes (Signed)
Quick Note:  Pt informed on home VM ______ 

## 2010-11-15 ENCOUNTER — Encounter: Payer: Self-pay | Admitting: Internal Medicine

## 2010-11-15 ENCOUNTER — Emergency Department (HOSPITAL_COMMUNITY): Payer: Managed Care, Other (non HMO)

## 2010-11-15 ENCOUNTER — Ambulatory Visit (INDEPENDENT_AMBULATORY_CARE_PROVIDER_SITE_OTHER): Payer: Managed Care, Other (non HMO) | Admitting: Internal Medicine

## 2010-11-15 ENCOUNTER — Emergency Department (HOSPITAL_COMMUNITY)
Admission: EM | Admit: 2010-11-15 | Discharge: 2010-11-15 | Disposition: A | Discharge status: Home / Self Care | Payer: Managed Care, Other (non HMO) | Attending: Emergency Medicine | Admitting: Emergency Medicine

## 2010-11-15 DIAGNOSIS — K5732 Diverticulitis of large intestine without perforation or abscess without bleeding: Secondary | ICD-10-CM | POA: Insufficient documentation

## 2010-11-15 DIAGNOSIS — Z87442 Personal history of urinary calculi: Secondary | ICD-10-CM | POA: Insufficient documentation

## 2010-11-15 DIAGNOSIS — K5792 Diverticulitis of intestine, part unspecified, without perforation or abscess without bleeding: Secondary | ICD-10-CM | POA: Insufficient documentation

## 2010-11-15 DIAGNOSIS — R1032 Left lower quadrant pain: Secondary | ICD-10-CM | POA: Insufficient documentation

## 2010-11-15 LAB — BASIC METABOLIC PANEL
BUN: 11 mg/dL (ref 6–23)
Chloride: 105 mEq/L (ref 96–112)
GFR calc Af Amer: >60 mL/min (ref >60)
Glucose, Bld: 91 mg/dL (ref 70–99)
Potassium: 3.8 mEq/L (ref 3.5–5.1)
Sodium: 140 mEq/L (ref 135–145)

## 2010-11-15 LAB — DIFFERENTIAL
Basophils Absolute: 0 10*3/uL (ref 0.0–0.1)
Lymphocytes Relative: 30 % (ref 12–46)
Neutro Abs: 5.1 10*3/uL (ref 1.7–7.7)
Neutrophils Relative %: 60 % (ref 43–77)

## 2010-11-15 LAB — CBC
HCT: 35.9 % — ABNORMAL LOW (ref 36.0–46.0)
Hemoglobin: 11.9 g/dL — ABNORMAL LOW (ref 12.0–15.0)
RBC: 4.1 MIL/uL (ref 3.87–5.11)
WBC: 8.5 10*3/uL (ref 4.0–10.5)

## 2010-11-15 LAB — URINALYSIS, ROUTINE W REFLEX MICROSCOPIC
Bilirubin Urine: NEGATIVE
Nitrite: NEGATIVE
Specific Gravity, Urine: 1.017 (ref 1.005–1.030)
pH: 6.5 (ref 5.0–8.0)

## 2010-11-15 LAB — URINE MICROSCOPIC-ADD ON

## 2010-11-15 MED ORDER — IOHEXOL 300 MG/ML  SOLN
100.0000 mL | Freq: Once | INTRAMUSCULAR | Status: AC | PRN
  Administered 2010-11-15: 100 mL via INTRAVENOUS

## 2010-11-15 NOTE — Assessment & Plan Note (Signed)
Clinical symptoms worsening despite appropriate abx therapy. Exam concerning for possible rebound tenderness. Fear po abx failure. Discussion held with pt and husband. Recommend ED evaluation with abd ct. May require inpt abx tx. Contacted ED charge nurse and informed of pt's impending arrival.

## 2010-11-15 NOTE — Progress Notes (Signed)
  Subjective:    Patient ID: [name], female    DOB: November 13, 1950, 60 y.o.   MRN: [id number]  HPI Pt presents to clinic for evaluation of possible diverticulitis. Has long standing h/o recurrent sigmoid diverticulitis with past complication of abscess requiring drainage per pt. Suffered episode in March treated succesfully with flagyl/cipro. Developed LLQ pain ~1 wk ago and was placed on flagyl/cipro. Noted initial improvement of pain however approximately 3-4 days ago developed intermittent severe left mid and lower abdominal pain without radiation. No associated fever, chills, nausea, vomitting or blood in stool. Compliant with abx's without adverse effect. No alleviating or exacerbating factors.   Reviewed pmh, medications and allergies    Review of Systems  Constitutional: Negative for fever and chills.  Gastrointestinal: Positive for abdominal pain. Negative for nausea, vomiting, diarrhea, blood in stool and abdominal distention.  Genitourinary: Negative for hematuria and difficulty urinating.  Skin: Negative for rash.       Objective:   Physical Exam  Nursing note and vitals reviewed. Constitutional: She appears well-developed and well-nourished. No distress.  HENT:  Head: Normocephalic and atraumatic.  Right Ear: External ear normal.  Left Ear: External ear normal.  Nose: Nose normal.  Eyes: Conjunctivae are normal. No scleral icterus.  Neck: Neck supple.  Abdominal: Soft. Normal appearance and bowel sounds are normal. She exhibits no distension, no ascites and no mass. There is no hepatosplenomegaly. There is tenderness in the left lower quadrant. There is rebound. There is no rigidity and no guarding.    Neurological: She is alert.  Skin: Skin is warm and dry. She is not diaphoretic.  Psychiatric: She has a normal mood and affect.          Assessment & Plan:

## 2010-11-18 ENCOUNTER — Ambulatory Visit: Payer: Managed Care, Other (non HMO) | Admitting: Internal Medicine

## 2010-11-18 ENCOUNTER — Encounter (INDEPENDENT_AMBULATORY_CARE_PROVIDER_SITE_OTHER): Payer: Self-pay | Admitting: General Surgery

## 2010-11-21 ENCOUNTER — Other Ambulatory Visit: Payer: Self-pay | Admitting: Family Medicine

## 2010-12-07 ENCOUNTER — Other Ambulatory Visit: Payer: Self-pay | Admitting: Family Medicine

## 2010-12-09 ENCOUNTER — Ambulatory Visit (INDEPENDENT_AMBULATORY_CARE_PROVIDER_SITE_OTHER): Payer: Commercial Indemnity | Admitting: General Surgery

## 2010-12-09 ENCOUNTER — Ambulatory Visit (INDEPENDENT_AMBULATORY_CARE_PROVIDER_SITE_OTHER): Payer: Self-pay | Admitting: Family Medicine

## 2010-12-09 ENCOUNTER — Encounter: Payer: Self-pay | Admitting: Family Medicine

## 2010-12-09 ENCOUNTER — Encounter (INDEPENDENT_AMBULATORY_CARE_PROVIDER_SITE_OTHER): Payer: Self-pay | Admitting: General Surgery

## 2010-12-09 VITALS — BP 138/90 | HR 103 | Temp 98.0°F | Ht 69.0 in | Wt 195.4 lb

## 2010-12-09 DIAGNOSIS — K5730 Diverticulosis of large intestine without perforation or abscess without bleeding: Secondary | ICD-10-CM

## 2010-12-09 DIAGNOSIS — M791 Myalgia, unspecified site: Secondary | ICD-10-CM

## 2010-12-09 DIAGNOSIS — M25519 Pain in unspecified shoulder: Secondary | ICD-10-CM

## 2010-12-09 DIAGNOSIS — IMO0001 Reserved for inherently not codable concepts without codable children: Secondary | ICD-10-CM

## 2010-12-09 MED ORDER — HYDROCODONE-ACETAMINOPHEN 5-325 MG PO TABS: ORAL_TABLET | ORAL | Status: DC

## 2010-12-09 NOTE — Progress Notes (Signed)
Patient returns for followup for recurrent episodes of diverticulitis over a number of years requiring recent hospitalization in 2010 with microperforation. She has just completed another course of antibiotics. At this point her abdominal pain has resolved. No fever or other complaints. Please see my detailed H&P of November 18, 2010.  On physical examination today her abdomen is soft and nontender.  Assessment and plan: Long history of recurrent bouts of diverticulitis and extensive pain and diverticulosis of the entire colon. We had discussed surgical options at her last visit. She has decided to proceed with surgery which I think is her best option. I had a long discussion today with the patient and her husband regarding the surgery. I have recommended laparoscopic assisted subtotal colectomy. We discussed the indications for the procedure and its nature. We discussed expected recovery and complications including anesthetic complications, bleeding, infection, anastomotic leak, possible need for open procedure, and possible need for diverging ostomy due to complication or technical difficulties. All their questions were answered. They had previously received a colon surgery pamphlet. They would like to schedule this in October we will arrange this for them.

## 2010-12-09 NOTE — Progress Notes (Signed)
  Subjective:    Patient ID: [name], female    DOB: 01-28-1951, 60 y.o.   MRN: [id number]  HPI Patient seen for the following issues  Recurrent right shoulder pain. Injected back in March and had great improvement until week or 2 ago. No recent injury. Remote history left rotator cuff repair. Denies neck pain. Pain with abduction and internal rotation. No definite weakness. Occasional night pain.  She complains of more generalized muscle aches and joint aches intermittently somewhat for years. Takes Mobic without much relief. No evidence for inflammatory arthritis such as redness, warmth, or visible joint edema.  No consistent Vit D use.  History recurrent diverticulitis. Long discussion with surgeon this morning and planned subtotal colectomy in October.   Review of Systems  Constitutional: Negative for fever and chills.  Respiratory: Negative for cough and shortness of breath.   Cardiovascular: Negative for chest pain, palpitations and leg swelling.  Genitourinary: Negative for dysuria.  Musculoskeletal: Positive for myalgias and arthralgias. Negative for back pain and gait problem.  Skin: Negative for rash.       Objective:   Physical Exam  Constitutional: She appears well-developed and well-nourished.  Cardiovascular: Normal rate and regular rhythm.   Pulmonary/Chest: Effort normal and breath sounds normal. No respiratory distress. She has no wheezes. She has no rales.  Musculoskeletal:       Right shoulder reveals good range of motion. No muscle atrophy. No bicipital tenderness. No a.c. joint tenderness. No appreciated rotator cuff weakness. Some pain with internal rotation and abduction around 90. Minimal pain with external rotation          Assessment & Plan:  #1 rotator cuff tendinitis. Patient requesting repeat injection. Discussed limits on injection with no more than 2 in 6 months or 3 in 1 year. Also would go ahead with x-rays right shoulder.   Discussed  risks and benefits of corticosteroid injection and patient consented.  After prepping skin with betadine, injected 40 mg depomedrol and 2 cc of plain xylocaine with 23 gauge one and one half inch needle using posterior lateral approach and pt tolerated well.  #2 diffuse myalgias and arthralgias. Check vitamin D level. She's had screen for inflammatory arthritis in past negative

## 2010-12-09 NOTE — Patient Instructions (Signed)
The office should contact you next week to schedule surgery. Please call if you do not hear from [place]. Call immediately for any recurrent abdominal pain or concerned about recurrent diverticulitis. Call for any questions regarding the surgery.

## 2010-12-09 NOTE — Patient Instructions (Signed)
Recommend followup with orthopedist if shoulder not improved after this injection

## 2010-12-13 ENCOUNTER — Telehealth: Payer: Self-pay | Admitting: *Deleted

## 2010-12-13 MED ORDER — ERGOCALCIFEROL 1.25 MG (50000 UT) PO CAPS: 50000.0000 [IU] | ORAL_CAPSULE | ORAL | Status: DC

## 2010-12-13 NOTE — Telephone Encounter (Signed)
Pt informed

## 2010-12-13 NOTE — Progress Notes (Signed)
Quick Note:  Pt informed ______ 

## 2011-01-05 ENCOUNTER — Other Ambulatory Visit: Payer: Self-pay | Admitting: Family Medicine

## 2011-01-05 DIAGNOSIS — R922 Inconclusive mammogram: Secondary | ICD-10-CM

## 2011-02-08 ENCOUNTER — Other Ambulatory Visit: Payer: Self-pay | Admitting: Family Medicine

## 2011-02-16 LAB — URINALYSIS, ROUTINE W REFLEX MICROSCOPIC
Hgb urine dipstick: NEGATIVE
Nitrite: NEGATIVE
Protein, ur: NEGATIVE
Specific Gravity, Urine: 1.012
Urobilinogen, UA: 1

## 2011-02-16 LAB — BASIC METABOLIC PANEL WITH GFR
BUN: 11
CO2: 27
Calcium: 9.9
Chloride: 106
Creatinine, Ser: 0.8
GFR calc non Af Amer: >60
Glucose, Bld: 96
Potassium: 4
Sodium: 140

## 2011-02-16 LAB — CBC
HCT: 36.8
Hemoglobin: 12.6
MCHC: 34.3
MCV: 89.5
Platelets: 255
RBC: 4.12
RDW: 13.8
WBC: 8.8

## 2011-02-16 LAB — DIFFERENTIAL
Basophils Absolute: 0
Basophils Relative: 0
Eosinophils Absolute: 0.3
Eosinophils Relative: 3
Lymphocytes Relative: 22
Lymphs Abs: 1.9
Monocytes Absolute: 0.4
Monocytes Relative: 5
Neutro Abs: 6.1
Neutrophils Relative %: 70

## 2011-02-17 ENCOUNTER — Ambulatory Visit
Admission: RE | Admit: 2011-02-17 | Discharge: 2011-02-17 | Disposition: A | Discharge status: Home / Self Care | Payer: Commercial Indemnity | Source: Ambulatory Visit | Attending: Family Medicine | Admitting: Family Medicine

## 2011-02-17 DIAGNOSIS — R922 Inconclusive mammogram: Secondary | ICD-10-CM

## 2011-02-20 ENCOUNTER — Telehealth: Payer: Self-pay | Admitting: Family Medicine

## 2011-02-20 ENCOUNTER — Telehealth (INDEPENDENT_AMBULATORY_CARE_PROVIDER_SITE_OTHER): Payer: Self-pay

## 2011-02-20 NOTE — Telephone Encounter (Signed)
Patient was told by nurse that she will need to repeat vitamin D labs. Please order. Thanks.

## 2011-02-20 NOTE — Telephone Encounter (Signed)
Patient's husband called and had many questions about his wife's upcoming surgery.  I answered most of them, as far as how long will surgery be, recovery, what kind of meds will she be sent home on, will she be able to shower on her own.  The husband is disabled and wants to make sure he can take care of her when she comes home.  He still wanted to know about what diet she will be on once home and about the risks of surgery including chance of ostomy.  He would like Dr Johna Sheriff to call him when he gets a chance.

## 2011-02-23 ENCOUNTER — Other Ambulatory Visit (INDEPENDENT_AMBULATORY_CARE_PROVIDER_SITE_OTHER): Payer: Self-pay | Admitting: General Surgery

## 2011-02-23 ENCOUNTER — Encounter (HOSPITAL_COMMUNITY): Payer: Commercial Indemnity

## 2011-02-23 ENCOUNTER — Ambulatory Visit (HOSPITAL_COMMUNITY)
Admission: RE | Admit: 2011-02-23 | Discharge: 2011-02-23 | Disposition: A | Discharge status: Home / Self Care | Payer: Commercial Indemnity | Source: Ambulatory Visit | Attending: General Surgery | Admitting: General Surgery

## 2011-02-23 DIAGNOSIS — Z01812 Encounter for preprocedural laboratory examination: Secondary | ICD-10-CM | POA: Insufficient documentation

## 2011-02-23 DIAGNOSIS — Z01818 Encounter for other preprocedural examination: Secondary | ICD-10-CM | POA: Insufficient documentation

## 2011-02-23 DIAGNOSIS — K5792 Diverticulitis of intestine, part unspecified, without perforation or abscess without bleeding: Secondary | ICD-10-CM

## 2011-02-23 DIAGNOSIS — Z01810 Encounter for preprocedural cardiovascular examination: Secondary | ICD-10-CM | POA: Insufficient documentation

## 2011-02-23 LAB — URINALYSIS, ROUTINE W REFLEX MICROSCOPIC
Nitrite: POSITIVE — AB
Protein, ur: 30 — AB
Urobilinogen, UA: 1

## 2011-02-23 LAB — CBC
MCV: 89.8 fL (ref 78.0–100.0)
Platelets: 250 10*3/uL (ref 150–400)
RBC: 4.03 MIL/uL (ref 3.87–5.11)
RDW: 13.4 % (ref 11.5–15.5)
WBC: 6.1 10*3/uL (ref 4.0–10.5)

## 2011-02-23 LAB — URINE CULTURE

## 2011-02-23 LAB — URINE MICROSCOPIC-ADD ON

## 2011-02-23 LAB — BASIC METABOLIC PANEL
CO2: 30 mEq/L (ref 19–32)
Chloride: 104 mEq/L (ref 96–112)
Creatinine, Ser: 0.62 mg/dL (ref 0.50–1.10)
GFR calc Af Amer: >60 mL/min (ref >60)
Sodium: 140 mEq/L (ref 135–145)

## 2011-02-27 LAB — DIFFERENTIAL
Basophils Relative: 0
Eosinophils Absolute: 0.3
Lymphs Abs: 2.3
Neutrophils Relative %: 62

## 2011-02-27 LAB — COMPREHENSIVE METABOLIC PANEL WITH GFR
ALT: 29
AST: 21
Albumin: 3.8
Alkaline Phosphatase: 60
Calcium: 9.7
GFR calc Af Amer: >60
Glucose, Bld: 92
Potassium: 4
Sodium: 140
Total Protein: 6.7

## 2011-02-27 LAB — URINALYSIS, ROUTINE W REFLEX MICROSCOPIC
Nitrite: NEGATIVE
Protein, ur: NEGATIVE
Urobilinogen, UA: 0.2

## 2011-02-27 LAB — URINE MICROSCOPIC-ADD ON

## 2011-02-27 LAB — COMPREHENSIVE METABOLIC PANEL
BUN: 11
CO2: 28
Chloride: 106
Creatinine, Ser: 0.79
GFR calc non Af Amer: >60
Total Bilirubin: 0.7

## 2011-02-27 LAB — CBC
MCV: 91.2
Platelets: 268
WBC: 7.9

## 2011-03-01 ENCOUNTER — Inpatient Hospital Stay (HOSPITAL_COMMUNITY)
Admission: RE | Admit: 2011-03-01 | Discharge: 2011-03-05 | DRG: 331 | Disposition: A | Discharge status: Home / Self Care | Payer: Commercial Indemnity | Source: Ambulatory Visit | Attending: General Surgery | Admitting: General Surgery

## 2011-03-01 ENCOUNTER — Other Ambulatory Visit (INDEPENDENT_AMBULATORY_CARE_PROVIDER_SITE_OTHER): Payer: Self-pay | Admitting: General Surgery

## 2011-03-01 DIAGNOSIS — J45909 Unspecified asthma, uncomplicated: Secondary | ICD-10-CM | POA: Diagnosis present

## 2011-03-01 DIAGNOSIS — K5732 Diverticulitis of large intestine without perforation or abscess without bleeding: Principal | ICD-10-CM | POA: Diagnosis present

## 2011-03-01 DIAGNOSIS — E079 Disorder of thyroid, unspecified: Secondary | ICD-10-CM | POA: Diagnosis present

## 2011-03-01 DIAGNOSIS — Z01810 Encounter for preprocedural cardiovascular examination: Secondary | ICD-10-CM

## 2011-03-01 DIAGNOSIS — K5730 Diverticulosis of large intestine without perforation or abscess without bleeding: Secondary | ICD-10-CM

## 2011-03-01 DIAGNOSIS — Z01812 Encounter for preprocedural laboratory examination: Secondary | ICD-10-CM

## 2011-03-01 DIAGNOSIS — R000 Tachycardia, unspecified: Secondary | ICD-10-CM | POA: Diagnosis not present

## 2011-03-01 DIAGNOSIS — I10 Essential (primary) hypertension: Secondary | ICD-10-CM | POA: Diagnosis present

## 2011-03-01 HISTORY — PX: LAPAROSCOPIC ASSISSTED TOTAL COLECTOMY W/ J-POUCH: SHX1918

## 2011-03-01 LAB — TYPE AND SCREEN: Antibody Screen: NEGATIVE

## 2011-03-02 LAB — CBC
Hemoglobin: 10.6 g/dL — ABNORMAL LOW (ref 12.0–15.0)
MCH: 29.6 pg (ref 26.0–34.0)
MCHC: 32.7 g/dL (ref 30.0–36.0)
MCV: 90.5 fL (ref 78.0–100.0)
Platelets: 202 10*3/uL (ref 150–400)

## 2011-03-03 LAB — CBC
MCHC: 33.5 g/dL (ref 30.0–36.0)
MCV: 91.1 fL (ref 78.0–100.0)
Platelets: 185 10*3/uL (ref 150–400)
RDW: 14.1 % (ref 11.5–15.5)
WBC: 8.4 10*3/uL (ref 4.0–10.5)

## 2011-03-03 LAB — BASIC METABOLIC PANEL
Chloride: 107 mEq/L (ref 96–112)
Creatinine, Ser: 0.51 mg/dL (ref 0.50–1.10)
GFR calc Af Amer: >90 mL/min (ref >90)
GFR calc non Af Amer: >90 mL/min (ref >90)

## 2011-03-06 NOTE — Op Note (Signed)
Misty, Ferrell NO.:  [id number]  MEDICAL RECORD NO.:  [id number]  LOCATION:  1530                         FACILITY:  [facility]  PHYSICIAN:  Sharlet Salina T. 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  SURGICAL PROCEDURE:  Laparoscopic total abdominal colectomy with stapled ileoproctostomy.  HISTORY OF PRESENT ILLNESS:  This patient is a 60 year old female with a history of multiple episodes of diverticulitis.  She was hospitalized in 2010 with microperforation, treated with antibiotics alone.  She, however, is continued to have frequent flare-ups of left-sided abdominal pain about every 4 months, treated with antibiotics.  More recently, she has had early and frequent recurrence of her symptoms following antibiotics.  Recent CT scan during an episode has shown extensive pandiverticulosis of the colon and acute diverticulitis showing the distal left of proximal sigmoid colon with edema and stranding.  After consultation and due to repeated and worsening episodes of recurrent diverticulitis, we have elected to proceed with elective colectomy.  Due to the extensive pandiverticulosis seen both on CT scan and colonoscopy, we have elected to proceed with total abdominal colectomy.  We discussed the indications of the procedure, its nature, expected recovery, and risks of anesthetic complications, bleeding, infection, anastomotic leak, and possible need for ostomy, and rare risk of death.  We discussed that there could be other unanticipated complications.  She desires to proceed and is brought to the operating room.  OPERATION:  The patient was brought into operating room.   She had undergone mechanical bowel prep the night before.  A general endotracheal anesthesia was induced.  She received preoperative broad- spectrum IV antibiotics.  PAS was in place.  She was positioned on a gel pad and placed in Yellow[place] stirrups with thighs minimally flexed. Foley catheter was placed.  The abdomen and perineum were widely sterilely prepped and draped.  Correct patient procedure were verified. Access was obtained with a 1.5 cm incision beneath the umbilicus. Dissection carried down to midline fascia which was incised for 1 cm and peritoneum entered under direct vision.  Through a mattress suture of 0 Vicryl, the Hasson trocar was placed and pneumoperitoneum established. Under direct vision, a 12 mm trocar was placed just medial and above the right anterosuperior iliac spine and another 5 mm trocar in the lateral midright abdomen.  Additional 5 mm trocars were placed just above the medial to the left anterosuperior iliac spine and the lateral midleft abdomen.  Laparoscopy revealed evidence of subacute and chronic diverticulitis with marked thickening of the sigmoid colon and adhesions, both chronic and subacute to the lateral pelvic sidewall. The remainder of the colon was not inflamed, but there were multiple diverticula noted throughout.  Small bowel, retroperitoneum appeared normal.  Liver, gallbladder appeared normal.  The sigmoid was approached initially.  The patient was placed in steep reverse Trendelenburg and the omentum placed up above the transverse colon over the stomach.  The base of the sigmoid mesentery was exposed.  The mesentery  was quite thick and fatty.  Peritoneum was incised at the base of sigmoid mesentery and careful dissection carried back toward the retroperitoneum.  The iliac vessels were identified.  The right ureter was identified through the peritoneum.  With blunt dissection back toward the lateral pelvic sidewall, the left ureter was  eventually identified.  At this point, I dissected around the inferior mesenteric pedicle staying somewhat up out of the retroperitoneum as this was benign disease and encircled this distal to takeoff of the left colic. This pedicle was then divided with a firing of the GIA stapler.  Further mesentery was dissected somewhat proximal and distal staying fairly close to the colon.  Following this, the lateral peritoneal attachments to the sigmoid and rectum both inflammatory and peritoneal attachments were divided with harmonic scalpel and the specimen was reflected over toward the midline.  We continued the dissection along the peritoneal reflection up along the left colon and the left colon was extensively mobilized toward the midline.  The splenocolic ligament was mobilized and completely taken down with harmonic scalpel.  This was a low splenic flexure.  The omentum was then elevated and using the harmonic scalpel in place, the patient in reverse Trendelenburg, the transverse colon was mobilized away from the omentum and lesser sac entered and the transverse colon extensively mobilized.  We came down around the hepatic flexure as well dissecting them off of the colon dividing the hepatic colic ligament with harmonic scalpel and mobilizing the hepatic flexure. Then, with the patient tilted to the left and working from the patient's left side we came down the right colon lateral peritoneal attachments and completely mobilized the right colon toward the midline, mobilizing attachments along the cecum and terminal ileum and completely mobilizing this with the appendix as well.  Elevating the right colon mesentery, I isolated the ileocolic vessel and again not going too far back toward the retroperitoneum and having previously identified the right ureter, I dissected around the ileocolic pedicle and divided it with harmonic scalpel for further mobilization.  We then again placed the patient  in steep reverse Trendelenburg and exposed the pelvis and distal rectosigmoid.  Further peritoneal attachments, medial and lateral to the upper rectum were incised with harmonic scalpel and we chose an area of the upper rectum which was soft and pliable without tinea below any evident area of diverticulosis.  This area was cleared off pericolic fat with harmonic scalpel and the mesentery was teased away posteriorly and a window created posterior to the rectum through the mesentery.  The rectum was then divided through the right lower quadrant trocar with two firings of the blue load Endo-GIA 45 mm stapler.  Holding the proximal divided stump superior line anteriorly, the remaining mesentery of the distal sigmoid was sequentially divided with harmonic scalpel again not going back too far into the peritoneum.  We again identified the ureter intact during this dissection.  At this point, the mesentery of the entire sigmoid and proximal left colon and rectosigmoid had been completely divided.  I began working along the mesentery of the proximal left colon up toward the splenic flexure.  However, the colon was extremely redundant and the mesentery very thick and fatty making this dissection difficult and tedious.  We had performed a thorough mobilization of the colon at this point and I felt that we could likely expose the remainder of the mesentery through a small extraction incision in the mid abdomen based on what we already dissected. Therefore, we extended  the umbilical incision to total about 6 cm in length through the umbilicus and the midline fascia and peritoneum were divided under direct vision.  A wound protector retractor was placed. We then extracted the distal rectosigmoid through this and extracted the inflamed, thickened sigmoid colon and proximal left colon through this port.  At this point, we were able to easily expose the mesentery through the extraction site and we  sequentially came through the mesentery of the splenic flexure, transverse colon, and right colon with the harmonic scalpel and also clamping and tying proximally.  We continued this around the right colon and the cecum, and ileum were extracted.  The proposed area of resection at the terminal ileum just proximal to the ileocecal valve was chosen and cleaned of fat and mesentery, and then using the pursestring clamp and a 2-0 Prolene pursestring suture was placed at the ileum which was then clamped and divided just distal to clamp and the specimen removed.  The end of the ileum was carefully dilated and a 29 mm EEA anvil placed in the ileum and the pursestring sutures secured with healthy-appearing bowel over the anvil in all directions.  Following this, keeping the bowel oriented by holding the end of the anvil with a grasping clamp through one of the trocars, it was introduced back into the abdominal cavity and GelPort placed and pneumoperitoneum reestablished.  Dr. Fannie Knee then went below and was able to pass the 29 mm EEA stapler transanally up to the rectal stump without any difficulty and under direct vision the spike was deployed just adjacent to the staple line of the rectal stump.  Working through the [employer] assist, the anvil was attached and the stapler closed excluding all extraneous tissue.  We assured that the mesentery of the terminal ileum was not twisted and then the stapler was fired and removed.  There were 2 good complete donuts of tissue.  The anastomosis appeared well perfused and under no tension.  Then, occluding the small bowel just proximal to the anastomosis, Dr. Fannie Knee performed rigid sigmoidoscopy and inflated the rectum and the anastomosis under saline irrigation, there was no evidence of leak.  The rectum was desufflated and the pelvis irrigated.  Complete hemostasis assured.  The abdomen was surveyed.  There was no evidence of bleeding, intestinal injury,  or other problems.  The GelPort, retractor, and trocars were removed. Gloves and instruments were changed.  The soft tissue around the small midline incision was infiltrated with Exparel local anesthetic.  The fascia was then closed with running loop #1 PDS.  The subcu was irrigated and skin closed with subcuticular Monocryl and Dermabond and trocar skin incisions were closed with subcuticular Monocryl and Dermabond.  Sponge and needle counts correct.  The patient was taken to the Recovery in stable condition.     Lorne Skeens. Artina Minella, M.D.     Tory Emerald  D:  03/01/2011  T:  03/02/2011  Job:  [id number]  cc:   Evelena Peat, M.D.  Wilhemina Bonito. Marina Goodell, MD [address] [place] [address]  Electronically Signed by Glenna Fellows M.D. on 03/06/2011 12:50:09 PM

## 2011-03-10 ENCOUNTER — Encounter (INDEPENDENT_AMBULATORY_CARE_PROVIDER_SITE_OTHER): Payer: Self-pay | Admitting: General Surgery

## 2011-03-10 ENCOUNTER — Ambulatory Visit (INDEPENDENT_AMBULATORY_CARE_PROVIDER_SITE_OTHER): Payer: Commercial Indemnity | Admitting: General Surgery

## 2011-03-10 VITALS — BP 116/70 | HR 60 | Temp 98.2°F | Resp 20 | Ht 69.0 in | Wt 186.0 lb

## 2011-03-10 DIAGNOSIS — Z09 Encounter for follow-up examination after completed treatment for conditions other than malignant neoplasm: Secondary | ICD-10-CM

## 2011-03-10 NOTE — Progress Notes (Signed)
Patient returns just over one week following laparoscopic total abnormal colectomy and ileoproctostomy. She has done extremely well. She was home after 3 days. At this point she is having minimal discomfort, on a regular diet and feeling strong. She is having about 5 loose bowel movements per day but has no difficulty controlling them.  On examination she appears well. Abdomen is soft and nontender her wounds all healing well.  Final pathology confirmed only diverticulitis and diverticulosis.  Assessment and plan: Doing very well postoperatively without complication. She will liberalize her diet and activity. Return in 4 weeks which should be a final check.

## 2011-03-10 NOTE — Patient Instructions (Signed)
Call as needed for concerns or questions. Regular diet okay.

## 2011-03-14 ENCOUNTER — Telehealth (INDEPENDENT_AMBULATORY_CARE_PROVIDER_SITE_OTHER): Payer: Self-pay

## 2011-03-14 NOTE — Telephone Encounter (Signed)
Called patient to follow up from after hours call that came in to our office from spouse, patient seemed dizzy and lightheaded last night.  Called patient on 03/14/11 and she reports she's drinking plenty of fluid and feel's much better today.

## 2011-03-15 LAB — CBC
HCT: 31.7 — ABNORMAL LOW
HCT: 33.5 — ABNORMAL LOW
MCHC: 34.2
MCV: 89.7
MCV: 90
Platelets: 203
Platelets: 231
RBC: 3.52 — ABNORMAL LOW
RBC: 3.74 — ABNORMAL LOW
WBC: 13.4 — ABNORMAL HIGH
WBC: 14.2 — ABNORMAL HIGH

## 2011-03-15 LAB — URINALYSIS, ROUTINE W REFLEX MICROSCOPIC
Bilirubin Urine: NEGATIVE
Ketones, ur: NEGATIVE
Nitrite: NEGATIVE
Protein, ur: 30 — AB
Urobilinogen, UA: 0.2
pH: 5.5

## 2011-03-15 LAB — BASIC METABOLIC PANEL
BUN: 16
CO2: 26
Chloride: 104
Chloride: 108
Creatinine, Ser: 0.85
GFR calc Af Amer: >60
GFR calc non Af Amer: 59 — ABNORMAL LOW
Potassium: 3.9
Potassium: 4.4
Sodium: 140

## 2011-03-15 LAB — I-STAT 8, (EC8 V) (CONVERTED LAB)
BUN: 14
Bicarbonate: 27.8 — ABNORMAL HIGH
Chloride: 107
HCT: 37
Operator id: 114531
pCO2, Ven: 41.9 — ABNORMAL LOW
pH, Ven: 7.43 — ABNORMAL HIGH

## 2011-03-15 LAB — URINE CULTURE

## 2011-03-17 ENCOUNTER — Telehealth (INDEPENDENT_AMBULATORY_CARE_PROVIDER_SITE_OTHER): Payer: Self-pay | Admitting: General Surgery

## 2011-03-17 ENCOUNTER — Inpatient Hospital Stay (HOSPITAL_COMMUNITY)
Admission: EM | Admit: 2011-03-17 | Discharge: 2011-03-27 | DRG: 329 | Disposition: A | Discharge status: Home Health | Payer: Managed Care, Other (non HMO) | Attending: General Surgery | Admitting: General Surgery

## 2011-03-17 ENCOUNTER — Emergency Department (HOSPITAL_COMMUNITY): Payer: Managed Care, Other (non HMO)

## 2011-03-17 DIAGNOSIS — R509 Fever, unspecified: Secondary | ICD-10-CM | POA: Diagnosis present

## 2011-03-17 DIAGNOSIS — D62 Acute posthemorrhagic anemia: Secondary | ICD-10-CM | POA: Diagnosis present

## 2011-03-17 DIAGNOSIS — F329 Major depressive disorder, single episode, unspecified: Secondary | ICD-10-CM | POA: Diagnosis present

## 2011-03-17 DIAGNOSIS — Z8719 Personal history of other diseases of the digestive system: Secondary | ICD-10-CM

## 2011-03-17 DIAGNOSIS — F3289 Other specified depressive episodes: Secondary | ICD-10-CM | POA: Diagnosis present

## 2011-03-17 DIAGNOSIS — Z85850 Personal history of malignant neoplasm of thyroid: Secondary | ICD-10-CM

## 2011-03-17 DIAGNOSIS — R221 Localized swelling, mass and lump, neck: Secondary | ICD-10-CM | POA: Diagnosis not present

## 2011-03-17 DIAGNOSIS — I10 Essential (primary) hypertension: Secondary | ICD-10-CM | POA: Diagnosis present

## 2011-03-17 DIAGNOSIS — R109 Unspecified abdominal pain: Secondary | ICD-10-CM | POA: Diagnosis present

## 2011-03-17 DIAGNOSIS — K929 Disease of digestive system, unspecified: Principal | ICD-10-CM | POA: Diagnosis present

## 2011-03-17 DIAGNOSIS — Z87442 Personal history of urinary calculi: Secondary | ICD-10-CM

## 2011-03-17 DIAGNOSIS — Y832 Surgical operation with anastomosis, bypass or graft as the cause of abnormal reaction of the patient, or of later complication, without mention of misadventure at the time of the procedure: Secondary | ICD-10-CM | POA: Diagnosis present

## 2011-03-17 DIAGNOSIS — Z9049 Acquired absence of other specified parts of digestive tract: Secondary | ICD-10-CM

## 2011-03-17 DIAGNOSIS — R220 Localized swelling, mass and lump, head: Secondary | ICD-10-CM | POA: Diagnosis not present

## 2011-03-17 DIAGNOSIS — K658 Other peritonitis: Secondary | ICD-10-CM | POA: Diagnosis present

## 2011-03-17 LAB — TYPE AND SCREEN
ABO/RH(D): A POS
Antibody Screen: NEGATIVE

## 2011-03-17 LAB — URINALYSIS, ROUTINE W REFLEX MICROSCOPIC
Nitrite: POSITIVE — AB
Protein, ur: NEGATIVE mg/dL
Specific Gravity, Urine: 1.017 (ref 1.005–1.030)
Urobilinogen, UA: 0.2 mg/dL (ref 0.0–1.0)

## 2011-03-17 LAB — CBC
HCT: 35.7 % — ABNORMAL LOW (ref 36.0–46.0)
Hemoglobin: 11.4 g/dL — ABNORMAL LOW (ref 12.0–15.0)
MCH: 29.1 pg (ref 26.0–34.0)
MCV: 91.1 fL (ref 78.0–100.0)
Platelets: 330 10*3/uL (ref 150–400)
RBC: 3.92 MIL/uL (ref 3.87–5.11)
WBC: 11.5 10*3/uL — ABNORMAL HIGH (ref 4.0–10.5)

## 2011-03-17 LAB — DIFFERENTIAL
Lymphocytes Relative: 16 % (ref 12–46)
Lymphs Abs: 1.8 10*3/uL (ref 0.7–4.0)
Monocytes Relative: 5 % (ref 3–12)
Neutrophils Relative %: 76 % (ref 43–77)

## 2011-03-17 LAB — COMPREHENSIVE METABOLIC PANEL
AST: 31 U/L (ref 0–37)
BUN: 11 mg/dL (ref 6–23)
CO2: 25 mEq/L (ref 19–32)
Calcium: 10.4 mg/dL (ref 8.4–10.5)
Chloride: 102 mEq/L (ref 96–112)
Creatinine, Ser: 0.74 mg/dL (ref 0.50–1.10)
GFR calc Af Amer: >90 mL/min (ref >90)
GFR calc non Af Amer: >90 mL/min (ref >90)
Glucose, Bld: 135 mg/dL — ABNORMAL HIGH (ref 70–99)
Total Bilirubin: 0.4 mg/dL (ref 0.3–1.2)

## 2011-03-17 LAB — OCCULT BLOOD, POC DEVICE: Fecal Occult Bld: POSITIVE

## 2011-03-17 LAB — URINE MICROSCOPIC-ADD ON

## 2011-03-17 MED ORDER — IOHEXOL 300 MG/ML  SOLN
100.0000 mL | Freq: Once | INTRAMUSCULAR | Status: AC | PRN
  Administered 2011-03-17: 100 mL via INTRAVENOUS

## 2011-03-17 NOTE — Telephone Encounter (Signed)
Pt spouse Leonette Most called from mobile stating he was rushing his wife to the ER, she was in the bathroom and had a large amount of blood (according to spouse). He was concerned because she had sx on 03/01/11 (removal of colon and appendix) by Dr. Johna Sheriff. He requested someone be contacted at [facility] to let them know they were on the way. Page sent to dr on call.

## 2011-03-18 DIAGNOSIS — K651 Peritoneal abscess: Secondary | ICD-10-CM

## 2011-03-18 DIAGNOSIS — K929 Disease of digestive system, unspecified: Secondary | ICD-10-CM

## 2011-03-18 LAB — MRSA PCR SCREENING: MRSA by PCR: NEGATIVE

## 2011-03-19 LAB — BASIC METABOLIC PANEL WITH GFR
BUN: 8 mg/dL (ref 6–23)
CO2: 28 meq/L (ref 19–32)
Calcium: 8.9 mg/dL (ref 8.4–10.5)
Chloride: 108 meq/L (ref 96–112)
Creatinine, Ser: 0.65 mg/dL (ref 0.50–1.10)
GFR calc Af Amer: >90 mL/min
GFR calc non Af Amer: >90 mL/min
Glucose, Bld: 144 mg/dL — ABNORMAL HIGH (ref 70–99)
Potassium: 4.5 meq/L (ref 3.5–5.1)
Sodium: 140 meq/L (ref 135–145)

## 2011-03-19 LAB — CBC
HCT: 32.6 % — ABNORMAL LOW (ref 36.0–46.0)
Platelets: 261 10*3/uL (ref 150–400)
RBC: 3.49 MIL/uL — ABNORMAL LOW (ref 3.87–5.11)
RDW: 14 % (ref 11.5–15.5)
WBC: 10.6 10*3/uL — ABNORMAL HIGH (ref 4.0–10.5)

## 2011-03-20 LAB — BASIC METABOLIC PANEL
BUN: 6 mg/dL (ref 6–23)
Calcium: 9.5 mg/dL (ref 8.4–10.5)
Chloride: 106 mEq/L (ref 96–112)
Creatinine, Ser: 0.58 mg/dL (ref 0.50–1.10)
GFR calc Af Amer: >90 mL/min (ref >90)
GFR calc non Af Amer: >90 mL/min (ref >90)

## 2011-03-20 LAB — CBC
HCT: 30.3 % — ABNORMAL LOW (ref 36.0–46.0)
MCH: 29.2 pg (ref 26.0–34.0)
MCHC: 31 g/dL (ref 30.0–36.0)
MCV: 94.1 fL (ref 78.0–100.0)
RDW: 13.7 % (ref 11.5–15.5)

## 2011-03-20 LAB — URINE CULTURE
Colony Count: >=100000
Culture  Setup Time: 201210200320

## 2011-03-21 NOTE — H&P (Signed)
[name], [name] NO.:  [id number]  MEDICAL RECORD NO.:  [id number]  LOCATION:  WLED                         FACILITY:  [facility]  PHYSICIAN:  Adolph Pollack, M.D.DATE OF BIRTH:  1950/10/29  DATE OF ADMISSION:  03/17/2011 DATE OF DISCHARGE:                             HISTORY & PHYSICAL   REASON FOR ADMISSION:  Acute abdominal pain, anastomotic leak.  HISTORY:  This is a 60 year old female status post laparoscopic total abdominal colectomy with ileoproctostomy on March 01, 2011, for diverticular disease.  She did well from that standpoint until after lunch today when she developed severe abrupt lower abdominal pain and bloody diarrhea.  Pain was so severe that she came to the hospital.  She had some fever while in the hospital and lower abdominal tenderness.  A CT scan was performed demonstrating some free intraperitoneal air as well as contrast leaking into the pelvis with the anastomosis in 2 small separate areas.  There is also air and fluid around this area consistent with anastomosis.  She is tachycardic as well in the emergency department.  PAST MEDICAL HISTORY: 1. Diverticulosis, diverticulitis. 2. Nephrolithiasis. 3. Thyroid cancer. 4. Hypertension.  PREVIOUS OPERATIONS: 1. Cesarean section. 2. Hysterectomy. 3. Thyroidectomy.  ALLERGIES:  PROVIGIL.  MEDICATIONS:  Synthroid, Cozaar, Zoloft, Mobic, Ambien, Singulair.  SOCIAL HISTORY:  She is married.  Denies current tobacco or alcohol use.  FAMILY HISTORY:  Not contributory to current situation.  REVIEW OF SYSTEMS:  CARDIOVASCULAR:  She denies any chest pain. PULMONARY:  She denies any shortness of breath.  GI:  As per HPI.  GU: She denies dysuria or hematuria.  HEMATOLOGIC:  No blood clots or deep venous thrombosis.  PHYSICAL EXAM:  GENERAL:  An ill-appearing female. VITAL SIGNS:  Temperature is 100.5, pulse 108, blood pressure is 113/66, respiratory rate 20, O2 sat is 97% on room  air. NECK:  Supple without mass. CARDIOVASCULAR:  Increased rate, regular rhythm. LUNGS:  Breath sounds shallow, but equal and clear. ABDOMEN:  Soft with tenderness and significant guarding in the lower abdomen.  There is mild tenderness in the upper abdomen.  There is a periumbilical wound and smaller lower abdominal wounds that are clean, dry, and intact. MUSCULOSKELETAL:  No edema. EXTREMITIES:  Warm.  LABORATORY DATA:  White cell count 11,500, hemoglobin 11.4, platelet count 330,000.  Electrolytes within normal limits except for glucose of 135.  CT scan demonstrates free intraperitoneal air in the left upper quadrant region.  There is a small amount of contrast leaking from the pelvic anastomosis with extraluminal gas and fluid.  IMPRESSION:  Acute abdomen with peritonitis secondary to anastomotic leak.  PLAN:  IV fluid hydration, broad-spectrum IV antibiotics, to the operating room for emergency exploratory laparotomy.  Possibilities include resection of anastomosis and end ileostomy, or draining the area around the anastomosis and performing loop ileostomy. This was discussed with her and her husband.  We went over the procedure and risks in detail.  Risks include but not limited to bleeding, infection, wound healing problems, anesthesia, accidental damage to intra-abdominal organs.  They seem to understand this and agree with the plan.     Adolph Pollack, M.D.     TJR/MEDQ  D:  03/18/2011  T:  03/18/2011  Job:  [id number]  Electronically Signed by Avel Peace M.D. on 03/21/2011 08:31:09 AM

## 2011-03-21 NOTE — Op Note (Signed)
[name], [name] NO.:  [id number]  MEDICAL RECORD NO.:  [id number]  LOCATION:  WLED                         FACILITY:  [facility]  PHYSICIAN:  Adolph Pollack, M.D.DATE OF BIRTH:  1950/07/24  DATE OF PROCEDURE:  03/18/2011 DATE OF DISCHARGE:                              OPERATIVE REPORT   PREOPERATIVE DIAGNOSIS:  Anastomotic leak.  POSTOPERATIVE DIAGNOSIS:  Anastomotic leak with pelvic abscess.  PROCEDURE:  Exploratory laparotomy, drainage of pelvic abscess, placement of 2 pelvic drains, loop ileostomy.  SURGEON:  Adolph Pollack, M.D.  ANESTHESIA:  General.  INDICATION:  [name] is a 60 year old female, who underwent an laparoscopic-assisted total abdominal colectomy with ileoproctostomy on March 01, 2011.  She did well until yesterday afternoon, she developed acute lower abdominal pain and bloody diarrhea.  She came to the emergency department and had findings consistent with peritonitis and anastomotic leak, is now brought to the operating room for emergency exploratory laparotomy.  TECHNIQUE:  She was brought to the operative room, placed supine on the operative table and general anesthetic was administered.  A Foley catheter had already been inserted.  The abdominal wall was then widely sterilely prepped and draped.  Beginning at the previous incision of the supraumbilical area, I re- incised this and carried it down to the pelvis dividing the skin subcutaneous tissue and fascia with electrocautery.  No significant adhesions to the anterior abdominal wall were noted.  I then explored the pelvic area and got into a pocket of purulent foul-smelling fluid and I evacuated this.  I broke up some adhesions between the small bowel on pelvic area.  I then was able to inspect the anastomosis.  The anterior aspect of the ileorectal anastomosis appeared intact.  However, there was one small area in the posterior aspect, that was  suspicious, although not actively draining succus material at the time.  It appeared to be well controlled situation at this time.  I did not think that taking down the anastomosis and doing an end ileostomy would be any better then performing drainage and a loop ileostomy.  I subsequently explored the rest of the abdomen, the rest of the small intestine, and all appeared normal and viable.  I then placed 2 stab incisions, one in the right lower quadrant, and one in the left lower quadrant, and brought 219 Blake drains into the wound. I placed one on the right and left side of the pelvic area near the anastomosis.  These were anchored to the skin with 4-0 nylon suture.  I then copiously irrigated out all quadrants of the abdominal cavity, the central area with saline solution and evacuated as much as possible.  I then picked the area on the ileum and created a small mesenteric defect close to the bowel.  A 14-French red rubber catheter was thread through the defect.  I then made a circular incision in the skin and subcutaneous tissue in the right mid abdomen area.  A cruciate incision was made in the fascia and then a track was dilated up to 2 fingers.  I brought the loop ileostomy up through this incision, leaving the red rubber catheter intact.  Following this, I placed the omentum back  over the intestine.  I then closed the fascia with a running double looped, double-stranded #1 PDS suture.  I irrigated the subcutaneous tissues.  I then closed the skin loosely with staples and placed Telfa wicks in between the staples.  Following this, I then sewed the red rubber catheter to the skin and cut excess away to form an ileostomy bar.  I then matured the loop ileostomy with interrupted 3-0 Vicryl sutures.  An appliance was placed.  A bulky dry dressing was then placed over the midline wound.  She tolerated the procedure well without any apparent complications. She was taken to recovery  room in satisfactory condition.     Adolph Pollack, M.D.     Kari Baars  D:  03/18/2011  T:  03/18/2011  Job:  [id number]  Electronically Signed by Avel Peace M.D. on 03/21/2011 08:31:49 AM

## 2011-03-22 NOTE — Discharge Summary (Signed)
  NAMEJAMECIA, [name] NO.:  [id number]  MEDICAL RECORD NO.:  [id number]  LOCATION:  1530                         FACILITY:  [facility]  PHYSICIAN:  Sharlet Salina T. Deepak Bless, M.D.DATE OF BIRTH:  05-25-1951  DATE OF ADMISSION:  03/01/2011 DATE OF DISCHARGE:  03/05/2011                              DISCHARGE SUMMARY   DISCHARGE DIAGNOSES:  Recurrent chronic diverticulitis and pandiverticulosis.  OPERATIONS AND PROCEDURES:  Laparoscopic total abdominal colectomy and stapled ileoproctostomy on March 01, 2011.  HISTORY OF PRESENT ILLNESS:  The patient is admitted for elective colectomy.  She has a history of recurrent episodes of diverticulitis with microperforation documented on CT scan.  She has had a thorough workup including colonoscopy by Dr. Marina Goodell and barium enema showing extensive pandiverticulosis involving the entire colon.  We have evaluated options including observation versus partial colectomy versus subtotal colectomy for management of her problem and due to the marked pandiverticulosis and recurrent episodes of diverticulitis, we have elected proceed with subtotal colectomy laparoscopically.  She has admitted for this procedure.  PAST MEDICAL HISTORY:  Generally unremarkable except as detailed above. No significant surgery.  Medically, she is followed for asthma, depression, chronic joint pain, and hypertension.  PERTINENT PHYSICAL EXAMINATION:  GENERAL:  She is moderately overweight. ABDOMEN:  Benign, soft, and nontender.  HOSPITAL COURSE:  On the morning of her procedure, she underwent an uneventful laparoscopic total abdominal colectomy with stapled ileoproctostomy.  On the 1st postoperative day, she had some moderate-to- severe mid abdominal pain and nausea, which was improved with medications.  White count was normal at 9000.  She had mild tachycardia of 110.  Abdominal exam was benign.  Her Foley was discontinued.  She was started on a clear  liquid diet.  On the 2nd postoperative day, she felt significantly better.  Denied pain or nausea.  She had a small bowel movement.  White count was normal at 8.4.  Hemoglobin normal at 10.6.  She was started on a full liquid diet.  On November 6th, 3rd postoperative day, she had several bowel movements and flatus and was tolerating a full liquid diet without any difficulty.  Abdomen was benign and small incisions are healing well.  She was felt stable for discharge.  She was discharged home with Tylox as needed for pain. Followup is to be in my office in 2 weeks.  Please note medications on discharge same as admission, which is ProAir inhaler, Singulair, Zoloft, Mobic, Ambien, Cozaar, loratadine, Synthroid, and Tylox as needed for pain.  Follow up in the office in 2 weeks.    Lorne Skeens. Justn Quale, M.D.    Tory Emerald  D:  03/20/2011  T:  03/20/2011  Job:  [id number]  Electronically Signed by Glenna Fellows M.D. on 03/22/2011 10:52:32 AM

## 2011-03-23 LAB — CBC
HCT: 29 % — ABNORMAL LOW (ref 36.0–46.0)
MCHC: 32.1 g/dL (ref 30.0–36.0)
RDW: 12.9 % (ref 11.5–15.5)

## 2011-03-26 LAB — URINE MICROSCOPIC-ADD ON

## 2011-03-26 LAB — URINALYSIS, ROUTINE W REFLEX MICROSCOPIC
Bilirubin Urine: NEGATIVE
Glucose, UA: NEGATIVE mg/dL
Ketones, ur: NEGATIVE mg/dL
Nitrite: NEGATIVE
Protein, ur: NEGATIVE mg/dL
Specific Gravity, Urine: 1.012 (ref 1.005–1.030)
Urobilinogen, UA: 0.2 mg/dL (ref 0.0–1.0)
pH: 6.5 (ref 5.0–8.0)

## 2011-03-27 LAB — BASIC METABOLIC PANEL
BUN: 11 mg/dL (ref 6–23)
CO2: 23 mEq/L (ref 19–32)
Chloride: 103 mEq/L (ref 96–112)
Glucose, Bld: 100 mg/dL — ABNORMAL HIGH (ref 70–99)
Potassium: 3.4 mEq/L — ABNORMAL LOW (ref 3.5–5.1)

## 2011-03-27 LAB — CBC
HCT: 33.7 % — ABNORMAL LOW (ref 36.0–46.0)
Hemoglobin: 10.7 g/dL — ABNORMAL LOW (ref 12.0–15.0)
MCHC: 31.8 g/dL (ref 30.0–36.0)
WBC: 9.6 10*3/uL (ref 4.0–10.5)

## 2011-03-30 ENCOUNTER — Encounter (INDEPENDENT_AMBULATORY_CARE_PROVIDER_SITE_OTHER): Payer: Self-pay | Admitting: General Surgery

## 2011-03-30 ENCOUNTER — Other Ambulatory Visit (INDEPENDENT_AMBULATORY_CARE_PROVIDER_SITE_OTHER): Payer: Self-pay

## 2011-03-30 NOTE — Telephone Encounter (Signed)
The pt's husband called today to ask if it would be a problem for the pt to take her Potassium Citrate pill. According to him it is "as big as a horse's hoof".  It is a very large pill and Dr. Belva Crome office advised they call us to make sure that it would not be a problem considering her surgery and her present condition.  Please let the pt know if she should discontinue this pill or get it refilled in liquid form.

## 2011-03-31 ENCOUNTER — Encounter (INDEPENDENT_AMBULATORY_CARE_PROVIDER_SITE_OTHER): Payer: Self-pay | Admitting: General Surgery

## 2011-03-31 ENCOUNTER — Ambulatory Visit (INDEPENDENT_AMBULATORY_CARE_PROVIDER_SITE_OTHER): Payer: Commercial Indemnity | Admitting: General Surgery

## 2011-03-31 ENCOUNTER — Inpatient Hospital Stay (HOSPITAL_COMMUNITY)
Admission: EM | Admit: 2011-03-31 | Discharge: 2011-05-08 | DRG: 345 | Disposition: A | Discharge status: Home / Self Care | Payer: Commercial Indemnity | Attending: General Surgery | Admitting: General Surgery

## 2011-03-31 VITALS — BP 108/82 | HR 100 | Temp 97.4°F | Resp 20 | Ht 69.0 in | Wt 179.5 lb

## 2011-03-31 DIAGNOSIS — Z9049 Acquired absence of other specified parts of digestive tract: Secondary | ICD-10-CM

## 2011-03-31 DIAGNOSIS — Z432 Encounter for attention to ileostomy: Secondary | ICD-10-CM

## 2011-03-31 DIAGNOSIS — Z8719 Personal history of other diseases of the digestive system: Secondary | ICD-10-CM

## 2011-03-31 DIAGNOSIS — K5730 Diverticulosis of large intestine without perforation or abscess without bleeding: Secondary | ICD-10-CM | POA: Diagnosis present

## 2011-03-31 DIAGNOSIS — Z85850 Personal history of malignant neoplasm of thyroid: Secondary | ICD-10-CM

## 2011-03-31 DIAGNOSIS — E875 Hyperkalemia: Secondary | ICD-10-CM | POA: Diagnosis not present

## 2011-03-31 DIAGNOSIS — I10 Essential (primary) hypertension: Secondary | ICD-10-CM | POA: Diagnosis present

## 2011-03-31 DIAGNOSIS — Y833 Surgical operation with formation of external stoma as the cause of abnormal reaction of the patient, or of later complication, without mention of misadventure at the time of the procedure: Secondary | ICD-10-CM | POA: Diagnosis present

## 2011-03-31 DIAGNOSIS — K9413 Enterostomy malfunction: Principal | ICD-10-CM | POA: Diagnosis present

## 2011-03-31 DIAGNOSIS — Z09 Encounter for follow-up examination after completed treatment for conditions other than malignant neoplasm: Secondary | ICD-10-CM

## 2011-03-31 DIAGNOSIS — K5792 Diverticulitis of intestine, part unspecified, without perforation or abscess without bleeding: Secondary | ICD-10-CM | POA: Insufficient documentation

## 2011-03-31 DIAGNOSIS — R062 Wheezing: Secondary | ICD-10-CM

## 2011-03-31 DIAGNOSIS — R110 Nausea: Secondary | ICD-10-CM | POA: Diagnosis not present

## 2011-03-31 DIAGNOSIS — E039 Hypothyroidism, unspecified: Secondary | ICD-10-CM | POA: Diagnosis present

## 2011-03-31 DIAGNOSIS — L989 Disorder of the skin and subcutaneous tissue, unspecified: Secondary | ICD-10-CM | POA: Diagnosis present

## 2011-03-31 DIAGNOSIS — E785 Hyperlipidemia, unspecified: Secondary | ICD-10-CM | POA: Diagnosis present

## 2011-03-31 DIAGNOSIS — L309 Dermatitis, unspecified: Secondary | ICD-10-CM | POA: Diagnosis present

## 2011-03-31 DIAGNOSIS — K9403 Colostomy malfunction: Principal | ICD-10-CM | POA: Diagnosis present

## 2011-03-31 DIAGNOSIS — IMO0002 Reserved for concepts with insufficient information to code with codable children: Secondary | ICD-10-CM | POA: Diagnosis present

## 2011-03-31 DIAGNOSIS — N390 Urinary tract infection, site not specified: Secondary | ICD-10-CM | POA: Diagnosis not present

## 2011-03-31 LAB — CBC
HCT: 39.7 % (ref 36.0–46.0)
MCHC: 32.2 g/dL (ref 30.0–36.0)
MCV: 90.2 fL (ref 78.0–100.0)
RDW: 14.2 % (ref 11.5–15.5)

## 2011-03-31 LAB — DIFFERENTIAL
Eosinophils Relative: 6 % — ABNORMAL HIGH (ref 0–5)
Lymphocytes Relative: 27 % (ref 12–46)
Lymphs Abs: 3 10*3/uL (ref 0.7–4.0)
Monocytes Absolute: 0.8 10*3/uL (ref 0.1–1.0)
Monocytes Relative: 8 % (ref 3–12)

## 2011-03-31 LAB — BASIC METABOLIC PANEL
BUN: 20 mg/dL (ref 6–23)
Creatinine, Ser: 1.03 mg/dL (ref 0.50–1.10)
GFR calc Af Amer: 68 mL/min — ABNORMAL LOW (ref >90)
GFR calc non Af Amer: 59 mL/min — ABNORMAL LOW (ref >90)
Glucose, Bld: 104 mg/dL — ABNORMAL HIGH (ref 70–99)

## 2011-03-31 NOTE — Progress Notes (Signed)
History: Patient returns to the office now 3 weeks following laparotomy and diverting ileostomy when she presented with a delayed anastomotic leak 3 weeks after laparoscopic total time we'll colectomy. She improved quickly in the hospital. Her main problem today is that the ileostomy bag began to leak yesterday and they have had difficulty getting any sort of seal around the stoma. Otherwise no abdominal pain, fever, or other complaints.  Exam: She generally appears well. Abdomen is soft and nontender. Her wound was loosely closed around wicks and is healing without evidence of infection and staples are removed. She had quite a bit of skin excoriation around her ileostomy making it difficult to get a device to adhere. We were able to clean this up and get the skin dry in the office and replaced her device with an apparent good seal. Home health is to followup after this weekend.  Assessment and plan: Recovering well following laparotomy and ileostomy for anastomotic leak except for difficulty with the ostomy device. We seemed to have a good seal after her visit today. They will call if there have any further problems otherwise I will see her back in one week.

## 2011-04-01 MED ORDER — SERTRALINE HCL 100 MG PO TABS
100.0000 mg | ORAL_TABLET | Freq: Every day | ORAL | Status: DC
  Filled 2011-04-01 (×2): qty 1

## 2011-04-01 MED ORDER — ACETAMINOPHEN 500 MG PO TABS
1000.0000 mg | ORAL_TABLET | Freq: Four times a day (QID) | ORAL | Status: DC | PRN
  Administered 2011-04-03: 650 mg via ORAL

## 2011-04-01 MED ORDER — ALBUTEROL SULFATE HFA 108 (90 BASE) MCG/ACT IN AERS
2.0000 | INHALATION_SPRAY | Freq: Four times a day (QID) | RESPIRATORY_TRACT | Status: DC | PRN
  Filled 2011-04-01: qty 6.7

## 2011-04-01 MED ORDER — CHLORHEXIDINE GLUCONATE 0.12 % MT SOLN
15.0000 mL | Freq: Two times a day (BID) | OROMUCOSAL | Status: DC
  Administered 2011-04-02 – 2011-05-07 (×55): 15 mL via OROMUCOSAL
  Filled 2011-04-01 (×78): qty 15

## 2011-04-01 MED ORDER — KCL IN DEXTROSE-NACL 20-5-0.45 MEQ/L-%-% IV SOLN
INTRAVENOUS | Status: DC
  Administered 2011-04-02 – 2011-04-05 (×3): via INTRAVENOUS
  Administered 2011-04-09: 10 mL/h via INTRAVENOUS
  Filled 2011-04-01 (×10): qty 1000

## 2011-04-01 MED ORDER — DIPHENHYDRAMINE HCL 12.5 MG/5ML PO ELIX: 12.5000 mg | ORAL_SOLUTION | Freq: Four times a day (QID) | ORAL | Status: DC | PRN

## 2011-04-01 MED ORDER — AMOXICILLIN-POT CLAVULANATE 875-125 MG PO TABS
1.0000 | ORAL_TABLET | Freq: Two times a day (BID) | ORAL | Status: DC
  Administered 2011-04-02 – 2011-04-09 (×16): 1 via ORAL
  Filled 2011-04-01 (×18): qty 1

## 2011-04-01 MED ORDER — HEPARIN SODIUM (PORCINE) 5000 UNIT/ML IJ SOLN
5000.0000 [IU] | Freq: Three times a day (TID) | INTRAMUSCULAR | Status: DC
  Administered 2011-04-02 – 2011-04-19 (×51): 5000 [IU] via SUBCUTANEOUS
  Filled 2011-04-01 (×101): qty 1

## 2011-04-01 MED ORDER — LOSARTAN POTASSIUM 50 MG PO TABS
50.0000 mg | ORAL_TABLET | Freq: Every day | ORAL | Status: DC
  Administered 2011-04-02 – 2011-05-07 (×37): 50 mg via ORAL
  Filled 2011-04-01 (×38): qty 1

## 2011-04-01 MED ORDER — BIOTENE DRY MOUTH MT LIQD
15.0000 mL | Freq: Four times a day (QID) | OROMUCOSAL | Status: DC
  Administered 2011-04-02 – 2011-05-07 (×60): 15 mL via OROMUCOSAL

## 2011-04-01 MED ORDER — MONTELUKAST SODIUM 10 MG PO TABS
10.0000 mg | ORAL_TABLET | Freq: Every day | ORAL | Status: DC
  Administered 2011-04-02 – 2011-05-07 (×36): 10 mg via ORAL
  Filled 2011-04-01 (×38): qty 1

## 2011-04-01 MED ORDER — MELOXICAM 15 MG PO TABS
15.0000 mg | ORAL_TABLET | Freq: Every day | ORAL | Status: DC
  Filled 2011-04-01 (×2): qty 1

## 2011-04-01 MED ORDER — LEVOTHYROXINE SODIUM 137 MCG PO TABS
137.0000 ug | ORAL_TABLET | Freq: Every day | ORAL | Status: DC
  Administered 2011-04-02 – 2011-05-08 (×37): 137 ug via ORAL
  Filled 2011-04-01 (×38): qty 1

## 2011-04-01 MED ORDER — SERTRALINE HCL 100 MG PO TABS
100.0000 mg | ORAL_TABLET | Freq: Every day | ORAL | Status: DC
  Administered 2011-04-02 – 2011-05-07 (×35): 100 mg via ORAL
  Filled 2011-04-01 (×38): qty 1

## 2011-04-01 MED ORDER — MORPHINE SULFATE 2 MG/ML IJ SOLN
1.0000 mg | INTRAMUSCULAR | Status: DC | PRN
  Administered 2011-04-02 (×2): 2 mg via INTRAVENOUS
  Administered 2011-04-03: 1 mg via INTRAVENOUS
  Administered 2011-04-04 (×2): 2 mg via INTRAVENOUS

## 2011-04-01 MED ORDER — ONDANSETRON HCL 4 MG/2ML IJ SOLN
4.0000 mg | INTRAMUSCULAR | Status: DC | PRN
Start: 2011-04-01 — End: 2011-05-08

## 2011-04-01 MED ORDER — DIPHENHYDRAMINE HCL 50 MG/ML IJ SOLN: 12.5000 mg | Freq: Four times a day (QID) | INTRAMUSCULAR | Status: DC | PRN

## 2011-04-01 MED ORDER — FLUCONAZOLE 100 MG PO TABS
100.0000 mg | ORAL_TABLET | Freq: Every day | ORAL | Status: DC
  Administered 2011-04-02 – 2011-04-09 (×8): 100 mg via ORAL
  Filled 2011-04-01 (×11): qty 1

## 2011-04-01 MED ORDER — ZOLPIDEM TARTRATE 10 MG PO TABS
10.0000 mg | ORAL_TABLET | Freq: Every day | ORAL | Status: DC
  Administered 2011-04-02 – 2011-05-07 (×35): 10 mg via ORAL
  Filled 2011-04-01 (×5): qty 1
  Filled 2011-04-01: qty 2
  Filled 2011-04-01 (×5): qty 1

## 2011-04-01 MED ORDER — MELOXICAM 15 MG PO TABS
15.0000 mg | ORAL_TABLET | Freq: Every day | ORAL | Status: DC
  Administered 2011-04-02 – 2011-04-21 (×20): 15 mg via ORAL
  Filled 2011-04-01 (×23): qty 1

## 2011-04-02 DIAGNOSIS — L309 Dermatitis, unspecified: Secondary | ICD-10-CM | POA: Diagnosis present

## 2011-04-02 MED ORDER — MORPHINE SULFATE 4 MG/ML IJ SOLN
INTRAMUSCULAR | Status: AC
  Administered 2011-04-02: 2 mg
  Filled 2011-04-02: qty 1

## 2011-04-02 MED ORDER — MORPHINE SULFATE 2 MG/ML IJ SOLN
INTRAMUSCULAR | Status: AC
  Administered 2011-04-02: 2 mg via INTRAVENOUS
  Filled 2011-04-02: qty 1

## 2011-04-02 MED ORDER — MORPHINE SULFATE 2 MG/ML IJ SOLN
INTRAMUSCULAR | Status: AC
  Administered 2011-04-03: 1 mg via INTRAVENOUS
  Filled 2011-04-02: qty 1

## 2011-04-02 MED ORDER — ZOLPIDEM TARTRATE 10 MG PO TABS
ORAL_TABLET | ORAL | Status: AC
  Filled 2011-04-02: qty 1

## 2011-04-02 NOTE — H&P (Signed)
NAMEANNET, MANUKYAN NO.:  [id number]  MEDICAL RECORD NO.:  [id number]  LOCATION:  1537                         FACILITY:  [facility]  PHYSICIAN:  Angelia Mould. Derrell Lolling, M.D.DATE OF BIRTH:  [birth year]  DATE OF ADMISSION:  03/31/2011 DATE OF DISCHARGE:                             HISTORY & PHYSICAL   CHIEF COMPLAINT:  Ileostomy bag leaking and skin red and painful.  HISTORY OF PRESENT ILLNESS:  This is a 60 year old Caucasian female who underwent elective laparoscopic-assisted subtotal colectomy with ileoproctostomy on March 01, 2011, for chronic diverticulosis.  She did well initially, but was readmitted on March 17, 2011, with pelvic abscess and anastomotic leak.  Dr. Abbey Chatters performed laparotomy, drainage of the pelvic abscess, and diverting loop ileostomy in the right lower quadrant.  The patient did well and went home, but has had problems with recurrent leaking from her ileostomy appliance.  This continues on a daily basis despite attention by visiting nurses and attention by Dr. Johna Sheriff in the office today.  The appliance he placed in the office today began leaking within 2 hours and they called me very frustrated because of skin redness and pain.  The patient is being admitted for IV hydration, wound, ostomy, skin care, and bowel rest. Outpatient management has failed in this regard.  PAST MEDICAL HISTORY: 1. Diverticulosis and diverticulitis. 2. Nephrolithiasis. 3. Thyroid cancer. 4. Hypertension.  SURGICAL HISTORY:  C-section.  Thyroidectomy.  Hysterectomy.  CURRENT MEDICATIONS: 1. Albuterol inhaler 2 puffs q.6 h. p.r.n. wheezing. 2. Augmentin 875 mg twice a day. 3. Loratadine 10 mg once a day. 4. Meloxicam 15 mg once a day. 5. Oxycodone 500 mg p.r.n. 6. Sertraline 100 mg once a day. 7. Singulair 10 mg once a day. 8. Synthroid 137 mcg once a day. 9. Ambien 10 mg at bedtime as needed for sleep. 10.Flaxseed nutritional supplement.  DRUG  ALLERGIES:  PROVIGIL.  FAMILY HISTORY:  Not contributory to the current situation.  SOCIAL HISTORY:  The patient is married.  Her husband is with her tonight.  She denies alcohol or tobacco.  REVIEW OF SYSTEMS:  A 12-system review of systems is performed and is negative except as described above.  PHYSICAL EXAMINATION:  GENERAL:  A pleasant middle-aged woman in minimal distress.  Her only complain is tenderness of the skin around the ileostomy.  She is nontoxic appearing. VITAL SIGNS:  Temperature 98.0, heart rate 99 and regular, blood pressure 106/63, respiratory rate 22, and oxygen saturation 97% on room air. HEENT:  Eyes, sclerae clear.  Extraocular movements intact.  Ears, nose, mouth, throat, lips, tongue, and oropharynx are without gross lesions. NECK:  Supple.  Nontender.  No mass.  No jugular venous distention. Well-healed thyroidectomy scar. LUNGS:  Clear to auscultation.  No chest wall tenderness. BREASTS:  Not examined. HEART:  Regular rate and rhythm.  No murmur.  Radial and dorsalis pedis pulses are palpable. ABDOMEN:  Somewhat obese, basically soft and nondistended without any guarding or rebound or mass.  There is a lower midline scar that has several open areas with a little bit of serosanguineous fluid draining out of them.  There is an elliptical shaped ostomy in the right lower quadrant, consistent with a loop  ileostomy.  The ostomy is healthy and the edges of the ostomy are intact and sutured to the skin edge.  There is no separation or retraction.  There is a large area of skin irritation with erythema and bumpiness consistent with leaking small bowel content.  There is no ulceration at this point in time.  A new appliance is being placed by the emergency department staff with barrier protection. EXTREMITIES: No peripheral edema.  He moves all 4 extremities well without pain or deformity. NEUROLOGIC:  No gross motor or sensory deficits.  ADMISSION DATABASE:   Hemoglobin 12.8, white count 11000.  Other labs pending.  IMPRESSION: 1. Right lower quadrant loop ileostomy with complications of skin     irritation, excoriation and possible infection. 2. Status post subtotal colectomy and ileoproctostomy. 3. Status post exploratory laparotomy and drainage of pelvic abscess     and diverting loop ileostomy. 4. Sleep apnea. 5. Diverticulosis. 6. History of thyroid cancer. 7. Hypertension. 8. Status post C section. 9. Status post thyroidectomy. 10.Status post hysterectomy.  PLAN: 1. The patient will be admitted to the hospital for intensive skin     management in around her ostomy. 2. IV hydration. 3. Bowel rest in the short-term to reduce ileostomy output. 4. Diflucan will be given p.o. just in case there is a fungal     component to this. 5. The wound and ostomy care are ordered. 6. She will need a daily enterostomal therapy nurse input if we are to     salvage the ileostomy.     Angelia Mould. Derrell Lolling, M.D.     HMI/MEDQ  D:  03/31/2011  T:  04/01/2011  Job:  [id number]  cc:   Lorne Skeens. [place], M.D. [address] [place] [address]  Evelena Peat, M.D.  Electronically Signed by Claud Kelp M.D. on 04/02/2011 11:00:46 PM

## 2011-04-02 NOTE — Consult Note (Signed)
WOC Nurse Note:  Patient seen for follow up on ostomy pouch leakage.  Pouch leaking upon arrival; previous pouch application the aperture was cur much wider than stoma measurement and parastomal skin is denuded and inflamed.  Pouch removed, parastomal skin cleansed and treated with application of premium powder topped with skin sealant. One-half barrier ring applied to back of pouch cut to fit stoma (7/8 inch x 1.75 inch).  Stool "blotted" from ileostomy until several seconds lapsed where skin could dry; pouch applied and held in place for approximately 2 minutes.  Lock and Roll closure closed.  Midline dressing changed.  I will plan to see patient tomorrow and evaluate pouching routine.  It is difficult to evaluate pouch seal because system has difficulty adhering to moist (denuded) skin.  I will continue to follow with you. Ladona Mow, MSN, RN, [facility], CWOCN [phone])

## 2011-04-02 NOTE — Progress Notes (Signed)
  Subjective: No complaints.  Current ostomy bag dry and intact  Objective: Vital signs in last 24 hours: Temp:  [98.2 F (36.8 C)-99 F (37.2 C)] 98.2 F (36.8 C) (11/04 1610) Pulse Rate:  [81-91] 87  (11/04 0632) Resp:  [20] 20  (11/04 0632) BP: (104-157)/(68-80) 115/80 mmHg (11/04 0632) SpO2:  [96 %-98 %] 97 % (11/04 9604) Weight:  [179 lb (81.193 kg)] 179 lb (81.193 kg) (11/03 1900)    Intake/Output from previous day: 11/03 0701 - 11/04 0700 In: 1060 [P.O.:360; I.V.:700] Out: 425 [Stool:425] Intake/Output this shift:    General appearance: no distress Abdomen soft and non tender.  Ostomy bag intact without leaking Lab Results:   [facility] 03/31/11 2110  WBC 11.0*  HGB 12.8  HCT 39.7  PLT 418*   BMET  Basename 03/31/11 2110  NA 140  K 4.3  CL 106  CO2 21  GLUCOSE 104*  BUN 20  CREATININE 1.03  CALCIUM 11.4*   PT/INR No results found for this basename: LABPROT:2,INR:2 in the last 72 hours ABG No results found for this basename: PHART:2,PCO2:2,PO2:2,HCO3:2 in the last 72 hours  Studies/Results: No results found.  Anti-infectives: Anti-infectives    None      Assessment/Plan: s/p ileostomy difficulty keeping ostomy device on due to skin irritation Advance diet Continue aggressive ostomy care/enterostomal therapy  LOS: 2 days    Frances Ambrosino T 04/02/2011

## 2011-04-02 NOTE — Progress Notes (Signed)
  This evening I was called by the patient's husband and by nursing. She continues to leak stool around her ileostomy. She has been placed on a regular diet and there is increased drainage. Multiple nurses have tried to control this without much success.  I came to the bedside and noticed a healthy loop ileostomy but with significant skin erythema and irritation. I placed a 24 French Foley catheter into each of the proximal and distal limbs and blew up the balloon to about 6 or 7 cc of saline. I was able to flush these easily. She tolerated this well. I then cleansed the skin, dried it, used benzoin and placed a new appliance. The Foley catheters came out through the end of the bag and were secured and connected to drainage bags. She will remain n.p.o. In hopes of diminishing the volume and weight of the ileostomy content.  My plan was discussed with the patient and her husband in detail. They understand that this is only a short-term solution.

## 2011-04-03 LAB — PTH, INTACT AND CALCIUM
Calcium, Total (PTH): 10.2 mg/dL (ref 8.4–10.5)
PTH: 46.3 pg/mL (ref 14.0–72.0)
PTH: 47.8 pg/mL (ref 14.0–72.0)

## 2011-04-03 MED ORDER — ACETAMINOPHEN 325 MG PO TABS
ORAL_TABLET | ORAL | Status: AC
  Administered 2011-04-03: 650 mg via ORAL
  Filled 2011-04-03: qty 2

## 2011-04-03 NOTE — Progress Notes (Signed)
Pt has min pain at stoma site and surrounding skin. Ostomy bag that was placed with assistance of WOC, RN remains intact, with drainage inside bag. Dressing C/D/I. Will continue to monitor site for additional leakage.

## 2011-04-03 NOTE — Progress Notes (Signed)
  AF/VSS  Still with problems with leaking devices but current device by ET working for several hours  Pt in NAD.  Current stoma bag dry and intact  Will continue current intensive efforts with ET

## 2011-04-03 NOTE — Progress Notes (Signed)
Multiple dressing changes performed on pt today. Ileostomy continues to leak. As received in report this am, 2 caths were placed to aid with drainage. 1 dislodged this am, Pt recently called out to state site was leaking. The 2nd cath was found dislodged at this time, with balloon still inflated. Pt denies any pain at site, just some burning and discomfort of her surrounding skin. Skin care  Completed and new dressings applied. Will continue to closely monitor and provide skin and comfort measures as needed. Dr. Johna Sheriff has been informed of the leakage and the dislodgment of the catheters. Order to have WOC,RN come to evaluate and make recommendations as needed. Jacki Cones, WOC, RN has ordered new pouches and will come and aid in new dressing change when supplies arrive.

## 2011-04-03 NOTE — Progress Notes (Signed)
Patients ileostomy bag leaking.  Pt with moderate amounts of liquid yellow/brown stool noted leaking onto skin.  Skin red and irritated with weeping areas noted and therefore hard to keep area dry for good seal.  Restore applied and appliance applied without success.  Area cleaned and towels applied to area.  Husband and patient state that husband called Dr Derrell Lolling and spoke with him concerning leakage of bag.  4 nurses attempted proper seal of bag without success and ostomy nurse in Saturday and Sunday without success of bag sealing.  Clista Bernhardt, Nursing Supervisor notified of above situation, ? If someone in house to help evaluate.  Evlyn Courier, Wound nurse paged x2 since [phone],  Awaiting response.  Dr Derrell Lolling in ER and spoke with him concerning above situation.  Instructed to obtain supplies and he would be up to evaluate the ileostomy.  Patient and husband notified of the above and verbalized understanding.  Dr Derrell Lolling in to see pt.

## 2011-04-03 NOTE — Consults (Signed)
WOC Nurse Note:  Patient seen in follow up for pouching system that continues to fail.  Dr. Derrell Lolling in last pm to insert two catheters into stoma to decrease leakage and to give epidermis a chance to heal.  Both catheters have worked their way out of the stoma secondary to peristalsis.  Today, pouching system is leaking upon inspection.  Pouch removed, skin cleansed and treated using stoma powder and crusting technique. Eakin pouch [place] [id number]) used today for pouching of loop ileostomy.  We will see how this system holds up with effluent and movement.  Pt is NPO at this time per Dr. Jacinto Halim request.  I will continue to follow. Ladona Mow, MSN, RN, GNP, CWOCN [phone])

## 2011-04-04 MED ORDER — OXYCODONE-ACETAMINOPHEN 5-325 MG PO TABS
1.0000 | ORAL_TABLET | ORAL | Status: DC | PRN
  Administered 2011-04-05: 2 via ORAL
  Administered 2011-04-06 – 2011-04-10 (×9): 1 via ORAL
  Administered 2011-04-10: 2 via ORAL
  Administered 2011-04-11 (×2): 1 via ORAL
  Administered 2011-04-12 – 2011-04-14 (×2): 2 via ORAL
  Administered 2011-04-15: 1 via ORAL
  Administered 2011-04-15 (×2): 2 via ORAL
  Administered 2011-04-16 – 2011-04-17 (×3): 1 via ORAL
  Administered 2011-04-17: 2 via ORAL
  Administered 2011-04-17 – 2011-04-20 (×8): 1 via ORAL
  Administered 2011-04-20: 2 via ORAL
  Filled 2011-04-04 (×4): qty 1
  Filled 2011-04-04: qty 2
  Filled 2011-04-04: qty 1
  Filled 2011-04-04: qty 2
  Filled 2011-04-04 (×2): qty 1
  Filled 2011-04-04: qty 2
  Filled 2011-04-04 (×4): qty 1
  Filled 2011-04-04: qty 2
  Filled 2011-04-04: qty 1
  Filled 2011-04-04 (×2): qty 2
  Filled 2011-04-04: qty 1
  Filled 2011-04-04 (×2): qty 2
  Filled 2011-04-04 (×3): qty 1
  Filled 2011-04-04: qty 2
  Filled 2011-04-04 (×8): qty 1

## 2011-04-04 NOTE — Progress Notes (Signed)
Patient called RN to room. Patient ostomy bag leaking again. RN changed ostomy bag,MD in and aware bags are still leaking and requiring multiple changes,even though bags are lasting longer than previous bags. More Eakin bags ordered from [employer]. Will continue to monitor patient.

## 2011-04-04 NOTE — Consult Note (Signed)
WOC Nurse Note: Patient seen in follow up from fistula pouch application yesterday.  Pouch applied at 2:10pm on 04/03/11 lasted 9 hours (leaked at 11pm). Two additional pouching attempts made thereafter.  Pouch leaking upon my arrival at 11:15am.  Eakin pouch used again today with angle changed slightly toward her right hip. Pouch lasted 30 minutes. Second attempt to be made momentarily. Ladona Mow, MSN, RN, [facility], CWOCN [phone])

## 2011-04-04 NOTE — Consults (Signed)
WOC Nurse Note:  Patient's pouch leaking after 30 minutes.  New fistula pouch applied with two barrier rings.  Next attempt will be made using a convex pouching system.  I will order supply to the bedside for application as needed.  Erythema and denudation extends 2-inches from 3 o'clock to 8 o'clock.  Prepped skin with stoma powder and sealed with tap water.  (Patient unable to tolerate any type of skin prep wipe on skin.)  I will follow with you. Ladona Mow, MSN, RN, [facility], CWOCN [phone])

## 2011-04-04 NOTE — Discharge Summary (Signed)
  [name], BALLES NO.:  [id number]  MEDICAL RECORD NO.:  [id number]  LOCATION:  1530                         FACILITY:  [facility]  PHYSICIAN:  Sharlet Salina T. Valente Fosberg, M.D.DATE OF BIRTH:  04-15-1951  DATE OF ADMISSION:  03/01/2011 DATE OF DISCHARGE:  03/05/2011                              DISCHARGE SUMMARY   DISCHARGE DIAGNOSIS:  Recurrent diverticulitis and pan diverticulosis.  OPERATION/PROCEDURES:  Laparoscopic total abdominal colectomy with stapled ileoproctostomy on March 01, 2011.  HISTORY OF PRESENT ILLNESS:  The patient is a 60 year old female with history of multiple episodes of diverticulitis.  She was hospitalized in 2010 with microperforation, treated with antibiotics alone.  She continued to have flare-ups of left-sided abdominal pain about every 4 months, treated with antibiotics.  Recent CT scan has shown sigmoid diverticulitis and pan diverticulosis of the entire colon.  Afterconsultation in the office due to repeated episodes of diverticulitis and severe pan diverticulosis, we have elected to proceed with laparoscopic total abdominal colectomy and ileoproctostomy.  She is admitted for this procedure.  SURGERY:  No significant surgical history except as above.  She has had a thyroidectomy for thyroid cancer.  Medically, she is followed for history of depression, asthma, hypertension.  MEDICATIONS:  Coreg, albuterol inhaler, Zoloft, Synthroid.  PERTINENT PHYSICAL:  Mildly obese female.  Abdomen is soft and nontender.  HOSPITAL COURSE:  The patient was admitted on the morning of procedure and underwent an uneventful laparoscopic total colectomy with anastomosis.  There were no technical issues with the anastomosis intraoperatively.  On the first postoperative day, she had significant amount abdominal pain overnight, but it was improving by morning.  White count was normal.  Hemoglobin 10 6.  Abdomen was soft.  She was started on clear  liquid diet.  By the 2nd postoperative day, she was feeling much better with minimal discomfort.  She had, had a small loose bowel movement.  Abdomen was soft, nontender.  White count was normal at 8.4. She was advanced to a full liquid diet.  She continued to steadily improve and was felt ready for discharge on March 05, 2011.  She was tolerating a soft diet.  Had several bowel movements.  Abdomen was benign.  Wounds healing well.  Final pathology is pending.  Follow up in my office in 1 week.    Lorne Skeens. Ameli Sangiovanni, M.D.    Tory Emerald  D:  03/27/2011  T:  03/27/2011  Job:  [id number]  Electronically Signed by Glenna Fellows M.D. on 04/04/2011 12:06:38 PM

## 2011-04-04 NOTE — Consults (Signed)
WOC ostomy consult  Stoma type/location: Right lower quadrant loop ileostomy Stomal assessment/size: 7/8 inch x 2 inches Peristomal assessment: denuded from 3 o'clock to 9 o'clock measuring 2 inches Treatment options for stomal/peristomal skin: crusting technique using powder with liquid skin protectant Output: Thin with flecks of solid feces, yellow Ostomy pouching: 1 piece fistua pouch (Eakin): Lawson # [id number]. Barrier rings used to protect denuded skin. Education provided: Limited education as we have not yet established a pouching system that is effective for longer than 9 hours. I will follow.  Thanks, Ladona Mow, MSN, RN, [facility], CWOCN [phone])

## 2011-04-04 NOTE — Progress Notes (Signed)
     Subjective: Feels a little better. Last two bags have lasted about 8 hr each, skin feels a little better  Objective: Vital signs in last 24 hours: Temp:  [97.5 F (36.4 C)-98.6 F (37 C)] 98.6 F (37 C) (11/06 0500) Pulse Rate:  [91-104] 104  (11/06 0500) Resp:  [18-20] 18  (11/06 0500) BP: (114-127)/(75-85) 127/85 mmHg (11/06 0500) SpO2:  [95 %-97 %] 95 % (11/06 0500) Last BM Date: 04/03/11  Intake/Output from previous day: 11/05 0701 - 11/06 0700 In: 481.1 [P.O.:240; I.V.:241.1] Out: 1350 [Urine:975; Stool:375] Intake/Output this shift:    General appearance: no distress GI: normal findings: soft, non-tender Incision/Wound:Clean.  Stoma device changed, moderate skin excoriation  Lab Results:  No results found for this basename: WBC:2,HGB:2,HCT:2,PLT:2 in the last 72 hours BMET  Basename 04/02/11 0440 04/01/11 1109  NA -- --  K -- --  CL -- --  CO2 -- --  GLUCOSE -- --  BUN -- --  CREATININE -- --  CALCIUM 10.2 9.6   PT/INR No results found for this basename: LABPROT:2,INR:2 in the last 72 hours ABG No results found for this basename: PHART:2,PCO2:2,PO2:2,HCO3:2 in the last 72 hours  Studies/Results: No results found.  Anti-infectives: Anti-infectives    None      Assessment/Plan:  Patient Active Hospital Problem List: Stoma dermatitis leaking ostomy bag (04/02/2011)   Assessment: Some improvement with current ostomy device/care   Plan: Conitnue intensive ostomy care      LOS: 4 days    Teagan Heidrick T 04/04/2011

## 2011-04-04 NOTE — Discharge Summary (Signed)
NAMESHARISE, [name] NO.:  [id number]  MEDICAL RECORD NO.:  [id number]  LOCATION:  1313                         FACILITY:  [facility]  PHYSICIAN:  Sharlet Salina T. Antoino Westhoff, M.D.DATE OF BIRTH:  1950-10-15  DATE OF ADMISSION:  03/17/2011 DATE OF DISCHARGE:  03/27/2011                              DISCHARGE SUMMARY   DISCHARGE DIAGNOSIS:  Anastomotic leak, status post laparoscopic total colectomy and ileoproctostomy.  SURGICAL PROCEDURE:  Exploratory laparotomy with pelvic drainage and diverting loop ileostomy, Adolph Pollack, M.D. on March 17, 2011.  HISTORY:  Ms. Ferrell is a 60 year old female who underwent an initially uneventful total abdominal colectomy with ileoproctostomy laparoscopically on March 01, 2011, for chronic severe pan-diverticular disease.  She had done very well for 3 weeks, but on the day of admission, developed fairly sudden severe lower abdominal pain and bloody diarrhea after eating.  She presented to the emergency room, was found to have free intraperitoneal air and a CT showed small amount of contrast leakage from the anastomosis.  PAST MEDICAL HISTORY:  Diverticulosis, diverticulitis as above, nephrolithiasis, history of thyroid cancer, hypertension, and mild depression.  SURGERY:  Includes C-section, hysterectomy, and thyroidectomy.  MEDICATIONS ON ADMISSION:  Synthroid, Cozaar, Zoloft, Mobic, Ambien, and Singulair.  ALLERGIES:  PROVIGIL.  Social history, family history, review of systems, see detailed H and P.  PERTINENT PHYSICAL:  VITAL SIGNS:  Temperature is 100.5, heart rate 108, and blood pressure 113/66. GENERAL:  She appeared uncomfortable. ABDOMEN:  Showed tenderness and guarding in the lower abdomen.  Mild tenderness in the upper abdomen.  Wounds appeared normal.  LABORATORY DATA:  White count 11.5 and hemoglobin 11.4.  HOSPITAL COURSE:  The patient was started on broad-spectrum antibiotics, taken emergently to  the operating room.  At the time of surgery, she was found to have cloudy fluid in the pelvis and some exudate along the back of the anastomosis according to Dr. Abbey Chatters, but no obvious leak or necrosis, and the tissue appeared viable.  He elected to perform a proximal diverting loop ileostomy, thoroughly washed out the pelvis, and placed two drains.  Postoperatively, she was maintained on IV Invanz. She immediately was more comfortable and vital signs remained stable. The remainder of her postoperative course was actually very smooth.  She was maintained on IV Invanz throughout.  Her white count normalized by March 20, 2011, and remained normal.  Her abdominal pain quickly resolved.  NG tube was removed on the third postoperative day, and then she was begun on a clear liquid diet, which she was able to tolerate well.  She was gradually advanced to a full liquid to a regular diet. She had wicks in her wound which were removed.  The ostomy was functioning well.  She is felt ready for discharge on March 27, 2011.The wound still had staples in place which were removed in 1 week. There was slight areas of skin separation around the wicks, all which appear clean.  Ostomy is functioning well and appears healthy.  Abdomen soft and nontender.  Discharge white count is 9.6 and hemoglobin is 10.7.  She will be continued on Augmentin p.o. for 1 week.  Also prescription for Tylox.  Followup will be in  my office in 1 week.     Lorne Skeens. Kacy Conely, M.D.     Tory Emerald  D:  03/27/2011  T:  03/27/2011  Job:  [id number]  Electronically Signed by Glenna Fellows M.D. on 04/04/2011 12:07:19 PM

## 2011-04-04 NOTE — Progress Notes (Signed)
Pt's ostomy bag  Leaking on the lower left side- replaced w/ another fistula bag at approx 2330.  Some leakage noted to same area one hour later.  Antifungal cream applied to excoriated skin.  Patient and nurse discussed ways to prevent leaking including lying on rt side during HS

## 2011-04-05 NOTE — Consult Note (Signed)
WOC ostomy consult  Stoma type/location: Right lower quadrant ileostomy Stomal assessment/size: 7/8 x 2 inches Peristomal assessment: Peristoma denudation (mild) from 3-5 o'clock and 1/2 inch.  Moderate denudation  from 5-7 o'clock and 1.5 inches.  Severe denudation from 7-9 o'clock and extending 4 inches from stoma secondary to pouch leaking toward the angle of the patient's right hip/iliac crest. Treatment options for stomal/peristomal skin: Crusting technique using stoma powder and a no-string silicone polymer spray (repeated x3).  Barrier rings (large and small) places over denuded and crusted areas. Output: thin fecal material with flecks of solid stool.  Low wall suction used to keep skin dry while new pouching system applied. Ostomy pouching: 1pc. Fistula pouch (medium) Kathrine Cords [place] [phone]).  Care provided:  Previous pouching system lasted 5 hours before leakage noted.  Leaking pouch removed and patient assisted into shower so she could bathe and wash her hair.  Activity olerated well and appeared to lift spirits.  Education:  Patient supported and reminded that pouch failures were not her fault.  Reinforced that the goal is to make all staff comfortable with pouching change routine so that patient's pouch can be changed immediately upon leakage and not have to wait for WOC Nurse.  Three nurses observed procedure, one of which will be here this weekend.  Instructions and supplies for future changes in room. We will follow daily during the week.  Please note:  WOC Nursing staff will not be here this weekend.  Thanks, Ladona Mow, MSN, RN, [facility], CWOCN [phone])

## 2011-04-05 NOTE — Progress Notes (Signed)
     Subjective: Feels much better, last stoma bag lasted >12hr.  Skin feels better  Objective: Vital signs in last 24 hours: Temp:  [98.2 F (36.8 C)-99 F (37.2 C)] 99 F (37.2 C) (11/07 0520) Pulse Rate:  [93-100] 99  (11/07 0520) Resp:  [18] 18  (11/07 0520) BP: (110-116)/(74-78) 114/74 mmHg (11/07 0520) SpO2:  [96 %-98 %] 96 % (11/07 0520) Last BM Date: 04/04/11  Intake/Output from previous day: 11/06 0701 - 11/07 0700 In: 591.7 [P.O.:480; I.V.:111.7] Out: 2475 [Urine:700; Stool:1775] Intake/Output this shift:    General appearance: alert and no distress GI: normal findings: soft, non-tender Incision/Wound:Clean, skin around device looks better  Lab Results:  No results found for this basename: WBC:2,HGB:2,HCT:2,PLT:2 in the last 72 hours BMET No results found for this basename: NA:2,K:2,CL:2,CO2:2,GLUCOSE:2,BUN:2,CREATININE:2,CALCIUM:2 in the last 72 hours PT/INR No results found for this basename: LABPROT:2,INR:2 in the last 72 hours ABG No results found for this basename: PHART:2,PCO2:2,PO2:2,HCO3:2 in the last 72 hours  Studies/Results: No results found.  Anti-infectives: Anti-infectives    None      Assessment/Plan: Principal Problem:  *Stoma dermatitis leaking ostomy bag   Improving.  Continue current Rx. Home when ET nurse feels situation has stabilized.   LOS: 5 days    Ronnel Zuercher T 04/05/2011

## 2011-04-06 ENCOUNTER — Telehealth: Payer: Self-pay | Admitting: Family Medicine

## 2011-04-06 NOTE — Progress Notes (Signed)
Oncall MD paged (Dr. Abbey Chatters), Alvis Lemmings has been paged several times with no response. She may be unavailable. Pt's ileostomy site is continuously leaking despite multiple efforts. I have performed dressing changes every hour if not more frequently. Pt has become increasingly more uncomfortable. Skin has been excoriated prior to today, however, pt states it is a little more irritated due to the frequent changes that started during the night shift. Ostomy care has been performed as instructed with half moon rings, pectin powder, skin spray. Received call from [place], WOC, RN. She stated she would be over to see pt asap. Dr. Abbey Chatters, stated he would be as soon as he finished surgery.

## 2011-04-06 NOTE — Progress Notes (Signed)
     Subjective: Bags generally lasting longer, about 12 hours.  No C/O  Objective: Vital signs in last 24 hours: Temp:  [97.9 F (36.6 C)-99.1 F (37.3 C)] 97.9 F (36.6 C) (11/08 0610) Pulse Rate:  [78-107] 103  (11/08 0610) Resp:  [16-18] 18  (11/08 0610) BP: (98-114)/(67-75) 98/68 mmHg (11/08 0610) SpO2:  [96 %-99 %] 96 % (11/08 0610) Last BM Date: 04/05/11  Intake/Output from previous day: 11/07 0701 - 11/08 0700 In: 798.1 [P.O.:480; I.V.:318.1] Out: 975 [Urine:325; Stool:650] Intake/Output this shift:    General appearance: alert and no distress GI: normal findings: soft, non-tender Skin: Excoriation lateral to stoma still present, stable  Lab Results:  No results found for this basename: WBC:2,HGB:2,HCT:2,PLT:2 in the last 72 hours BMET No results found for this basename: NA:2,K:2,CL:2,CO2:2,GLUCOSE:2,BUN:2,CREATININE:2,CALCIUM:2 in the last 72 hours PT/INR No results found for this basename: LABPROT:2,INR:2 in the last 72 hours ABG No results found for this basename: PHART:2,PCO2:2,PO2:2,HCO3:2 in the last 72 hours  Studies/Results: No results found.  Anti-infectives: Anti-infectives     Start     Dose/Rate Route Frequency Ordered Stop   04/02/11 1000   amoxicillin-clavulanate (AUGMENTIN) 875-125 MG per tablet 1 tablet        1 tablet Oral Every 12 hours 04/01/11 1036     04/01/11 0000   fluconazole (DIFLUCAN) tablet 100 mg        100 mg Oral Daily 04/01/11 0005            Assessment/Plan: Principal Problem:  *Stoma dermatitis leaking ostomy bag Overall improved but not ready for discharge.  Continue current management.   LOS: 6 days    Schneider Warchol T 04/06/2011

## 2011-04-06 NOTE — Progress Notes (Signed)
She has had trouble with leaking around the appliance all day.  Has a little bit a leakage laterally now.  She needs a bigger suction tube and that has been ordered.  Will have her lie on her left side to prevent ostomy drainage from leaking laterally.

## 2011-04-06 NOTE — Telephone Encounter (Signed)
I called husband, pt has been in the hospital a total of 26 days since 10/3, did go home once and developed problems, returned back to hospital.  Appendectomy and colon surgery, lots of problems with ostomy.  They were not sure if Dr Caryl Never was aware.

## 2011-04-06 NOTE — Progress Notes (Signed)
[id number]-Paged Amy, RN with wound/ostomy notified of issues with patients ostomy leaking.  Pt crying and expressing that "that something has to be done."  Amy instructed [place] to call Dawn, rn with ostomy.  Lowella Bandy, RN pts rn currently on phone with Alvis Lemmings, [employer] fund[employer].  Dawn, Rn wound/ostomy nurse to come and evaluate ostomy this pm.  Pt and family notified of above and nursing supervisor, Ethlyn Gallery, RN.  Support given to family.

## 2011-04-06 NOTE — Consult Note (Signed)
Ostomy follow-up note:  Pt's ostomy pouch has leaked and been changed 5 times today by bedside nurses.  Called in to assist with very difficult pouching situation.  Skin denuded to entire area surrounding stoma in affected area of approx 6 cm surrounding stoma.  Stoma points down towards skin level at 6 o'clock and skin is so moist it is difficult to maintain pouch seal.  Protected with skin sealant, powder, barrier ring, and Eakin pouch.  Applied suction catheter inside pouch to assist with keeping drainage away from skin and allow healing.  Demonstrated plan of care to husband, pt and nurses at bedside.  Will re-assess pouching system in AM. Supplies at bedside for staff use.  Cammie Mcgee, RN, MSN, [employer] [phone]

## 2011-04-06 NOTE — Telephone Encounter (Signed)
Pt would like nancy to return his call husband (chuck) concerning his wife.

## 2011-04-07 ENCOUNTER — Encounter (INDEPENDENT_AMBULATORY_CARE_PROVIDER_SITE_OTHER): Payer: Commercial Indemnity | Admitting: General Surgery

## 2011-04-07 NOTE — Progress Notes (Signed)
     Subjective: Good spirits today, ET nurse placed suction catheter in bag which has worked better after more leakage yesterday  Objective: Vital signs in last 24 hours: Temp:  [98.5 F (36.9 C)-99.8 F (37.7 C)] 98.8 F (37.1 C) (11/09 0515) Pulse Rate:  [90-101] 101  (11/09 0515) Resp:  [16-18] 16  (11/09 0515) BP: (105-120)/(67-80) 105/67 mmHg (11/09 0515) SpO2:  [94 %-99 %] 94 % (11/09 0515) Last BM Date: 04/06/11  Intake/Output from previous day: 11/08 0701 - 11/09 0700 In: 1077 [P.O.:840; I.V.:237] Out: 1600 [Urine:900; Stool:700] Intake/Output this shift:    General appearance: alert and no distress GI: normal findings: soft, non-tender Skin: Excoriation around stoma but not severe Incision/Wound:Clean, healing  Lab Results:  No results found for this basename: WBC:2,HGB:2,HCT:2,PLT:2 in the last 72 hours BMET No results found for this basename: NA:2,K:2,CL:2,CO2:2,GLUCOSE:2,BUN:2,CREATININE:2,CALCIUM:2 in the last 72 hours PT/INR No results found for this basename: LABPROT:2,INR:2 in the last 72 hours ABG No results found for this basename: PHART:2,PCO2:2,PO2:2,HCO3:2 in the last 72 hours  Studies/Results: No results found.  Anti-infectives: Anti-infectives     Start     Dose/Rate Route Frequency Ordered Stop   04/02/11 1000   amoxicillin-clavulanate (AUGMENTIN) 875-125 MG per tablet 1 tablet        1 tablet Oral Every 12 hours 04/01/11 1036     04/01/11 0000   fluconazole (DIFLUCAN) tablet 100 mg        100 mg Oral Daily 04/01/11 0005            Assessment/Plan: Principal Problem:  *Stoma dermatitis leaking ostomy bag Doing better with suction catheter, continue for      LOS: 7 days    Mikolaj Woolstenhulme T 04/07/2011

## 2011-04-07 NOTE — Consult Note (Signed)
Pouch leaking after 6 hours.  Skin extremely macerated surrounding stoma, painful and leaking towards abd wound.  Small Eakin pouch will not adhere to skin with previous methods attempted.  Applied large Eakin pouch with suction catheter inserted, since stool is now watery drainage again.  Staff informed to use large Eakin pouches over the weekend to decrease pouch change frequency R/T larger surface area, WOC nusing staff not available on weekends.  Supplies and directions for staff use left at bedside.  Cammie Mcgee, RN, MSN, [employer]  [phone]

## 2011-04-07 NOTE — Progress Notes (Signed)
Pt's pouch leaking again. Drainage cleansed and skin continues to weep and is excoriated. Pink taped used over past 1.5hrs to reinforce sides of dressing.  Bag changed per order Alvis Lemmings, [place]). Suction tubing applied. Pt tolerated procedure well. Will continue to monitor site closely.

## 2011-04-07 NOTE — Telephone Encounter (Signed)
Spoke with patient's husband.  She has had complications from recent surgery.  He has updated me regarding her condition.

## 2011-04-07 NOTE — Consult Note (Signed)
Ostomy follow-up note: Suction catheter inside Eakin pouch has helped decrease leaking episodes.  Pouch changed once by bedside nurses during the night.  Removed pouch to assess skin.  Remains with partial thickness skin loss surrounding stoma, which is red and viable and at skin level.  Denuded areas have improved, with decreased pain and moisture.  Protected skin with powder, spray, barrier ring, and Eakin pouch.  Reapplied suction catheter inside pouch to continue to assist with liquid stool, but if output continues to thicken, then this method will not be effective related to clogging of the catheter tip and it will need to be discontinued. Supplies ordered and steps written for bedside nurses to use this weekend.  Demonstrated pouching procedure to patient so she will be able to assist with troubleshooting during the application process.    Cammie Mcgee, RN, MSN, [employer]  [phone]

## 2011-04-08 NOTE — Progress Notes (Signed)
     Subjective: No issues overnight. Ostomy bag sealed and no leakage.  Tolerating regular diet.  Objective: Vital signs in last 24 hours: Temp:  [98.3 F (36.8 C)-98.7 F (37.1 C)] 98.7 F (37.1 C) (11/10 0529) Pulse Rate:  [74-111] 74  (11/10 0529) Resp:  [18-22] 18  (11/10 0529) BP: (97-112)/(63-78) 97/63 mmHg (11/10 0529) SpO2:  [97 %-98 %] 98 % (11/10 0529) Last BM Date: 04/07/11  Intake/Output from previous day: 11/09 0701 - 11/10 0700 In: 1093.7 [P.O.:960; I.V.:133.7] Out: 1200 [Urine:800; Stool:400] Intake/Output this shift:    General appearance: alert, cooperative and no distress GI: soft, non-tender; bowel sounds normal; no masses,  no organomegaly, RLQ ostomy bag sealed and no leakage  Lab Results:  @LABLAST2 (wbc:2,hgb:2,hct:2,plt:2) BMET No results found for this basename: NA:2,K:2,CL:2,CO2:2,GLUCOSE:2,BUN:2,CREATININE:2,CALCIUM:2 in the last 72 hours PT/INR No results found for this basename: LABPROT:2,INR:2 in the last 72 hours ABG No results found for this basename: PHART:2,PCO2:2,PO2:2,HCO3:2 in the last 72 hours  Studies/Results: No results found.  Anti-infectives: Anti-infectives     Start     Dose/Rate Route Frequency Ordered Stop   04/02/11 1000   amoxicillin-clavulanate (AUGMENTIN) 875-125 MG per tablet 1 tablet        1 tablet Oral Every 12 hours 04/01/11 1036     04/01/11 0000   fluconazole (DIFLUCAN) tablet 100 mg        100 mg Oral Daily 04/01/11 0005            Assessment/Plan: s/p  Doing well, will continue wound care and diet to alllow skin to heal.  LOS: 8 days    [name] [name] 04/08/2011

## 2011-04-09 NOTE — Plan of Care (Signed)
Problem: Phase I Progression Outcomes Goal: Pouching system intact Outcome: Not Progressing Skin around ostomy exoriated and appliance does not adhere well Goal: Stoma viable/dressing dry/intact Outcome: Progressing Stoma pink and viable but ostomy bag difficult to adhere

## 2011-04-09 NOTE — Progress Notes (Signed)
     Subjective: No issues.  Some leakage but the effluent has been controlled with suction.  She states that the skin feels good.  Objective: Vital signs in last 24 hours: Temp:  [97 F (36.1 C)-99.7 F (37.6 C)] 98.4 F (36.9 C) (11/11 0600) Pulse Rate:  [64-109] 64  (11/11 0600) Resp:  [18-20] 18  (11/11 0600) BP: (102-108)/(65-72) 108/66 mmHg (11/11 0600) SpO2:  [97 %-98 %] 98 % (11/11 0600) Last BM Date: 04/08/11  Intake/Output from previous day: 11/10 0701 - 11/11 0700 In: 977 [P.O.:720; I.V.:257] Out: 1050 [Urine:1050] Intake/Output this shift:    General appearance: alert, cooperative and no distress GI: ostomy functioning, appliance seems to be working okay at this time  Lab Results:  @LABLAST2 (wbc:2,hgb:2,hct:2,plt:2) BMET No results found for this basename: NA:2,K:2,CL:2,CO2:2,GLUCOSE:2,BUN:2,CREATININE:2,CALCIUM:2 in the last 72 hours PT/INR No results found for this basename: LABPROT:2,INR:2 in the last 72 hours ABG No results found for this basename: PHART:2,PCO2:2,PO2:2,HCO3:2 in the last 72 hours  Studies/Results: No results found.  Anti-infectives: Anti-infectives     Start     Dose/Rate Route Frequency Ordered Stop   04/02/11 1000   amoxicillin-clavulanate (AUGMENTIN) 875-125 MG per tablet 1 tablet        1 tablet Oral Every 12 hours 04/01/11 1036     04/01/11 0000   fluconazole (DIFLUCAN) tablet 100 mg        100 mg Oral Daily 04/01/11 0005            Assessment/Plan: s/p  No new issues, wound care  LOS: 9 days    [name] [name] 04/09/2011

## 2011-04-10 NOTE — Consult Note (Signed)
WOC ostomy consult  Patient's ostomy pouching system leaked intermittently over weekend; longest pouch wear time is 12 hours, shortest is as little as 1 hour.  Variations on all themes of pouching solutions attempted-including attaching to low wall suction and using largest fistua pouch manufactured.  Pouch leaking upon my arrival today.  Large Eakin fistula pouch applied over stoma today with pre-treatment of peristomal skin denudation with pectin powder sealed with Marathon skin sealent (purple colored copolymer).  4-inch barrier ring cut in half and fashioned around stoma to serve as additional barrier.  Red rubber catheter placed over stoma and taped in place ("slit" cut into top of ostomy pouch).  Patient quiet during procedure; physically and emotionally depleted at this time. Reports getting little rest last night.  I will not see tomorrow or Wednesday, but our department will see both days. We will follow.

## 2011-04-10 NOTE — Progress Notes (Signed)
     Subjective: Suction catheter working OK but still frequent leaks, skin some better  Objective: Vital signs in last 24 hours: Temp:  [97 F (36.1 C)-98.2 F (36.8 C)] 97.3 F (36.3 C) (11/12 0555) Pulse Rate:  [93-111] 95  (11/12 0555) Resp:  [16-18] 16  (11/12 0555) BP: (96-150)/(65-92) 97/65 mmHg (11/12 0555) SpO2:  [95 %-97 %] 97 % (11/12 0555) Last BM Date: 04/09/11  Intake/Output from previous day: 11/11 0701 - 11/12 0700 In: 110 [I.V.:110] Out: 400 [Urine:400] Intake/Output this shift:    General appearance: no distress GI: normal findings: soft, non-tender, device intact over stoma, skin excoriation some better  Lab Results:  No results found for this basename: WBC:2,HGB:2,HCT:2,PLT:2 in the last 72 hours BMET No results found for this basename: NA:2,K:2,CL:2,CO2:2,GLUCOSE:2,BUN:2,CREATININE:2,CALCIUM:2 in the last 72 hours PT/INR No results found for this basename: LABPROT:2,INR:2 in the last 72 hours ABG No results found for this basename: PHART:2,PCO2:2,PO2:2,HCO3:2 in the last 72 hours  Studies/Results: No results found.  Anti-infectives: Anti-infectives     Start     Dose/Rate Route Frequency Ordered Stop   04/02/11 1000   amoxicillin-clavulanate (AUGMENTIN) 875-125 MG per tablet 1 tablet        1 tablet Oral Every 12 hours 04/01/11 1036     04/01/11 0000   fluconazole (DIFLUCAN) tablet 100 mg        100 mg Oral Daily 04/01/11 0005            Assessment/Plan: Principal Problem:  *Stoma dermatitis leaking ostomy bag Continues with problems despite intensive management.  Not sure she will be able to go home with current situation.  Might consider early stoma reversal.  Will D/C abx, has completed full course     LOS: 10 days    Adriana Quinby T 04/10/2011

## 2011-04-11 LAB — CBC
HCT: 34.1 % — ABNORMAL LOW (ref 36.0–46.0)
MCV: 89.7 fL (ref 78.0–100.0)
Platelets: 217 10*3/uL (ref 150–400)
RBC: 3.8 MIL/uL — ABNORMAL LOW (ref 3.87–5.11)
WBC: 7.1 10*3/uL (ref 4.0–10.5)

## 2011-04-11 LAB — DIFFERENTIAL
Eosinophils Relative: 11 % — ABNORMAL HIGH (ref 0–5)
Lymphocytes Relative: 37 % (ref 12–46)
Lymphs Abs: 2.6 10*3/uL (ref 0.7–4.0)
Monocytes Absolute: 0.5 10*3/uL (ref 0.1–1.0)
Neutro Abs: 3.2 10*3/uL (ref 1.7–7.7)

## 2011-04-11 NOTE — Progress Notes (Signed)
     Subjective: No C/O.  Current bag placed by Jacki Cones yesterday still intact after 18h-best yet  Objective: Vital signs in last 24 hours: Temp:  [97.8 F (36.6 C)-99.1 F (37.3 C)] 97.9 F (36.6 C) (11/13 0445) Pulse Rate:  [80-104] 95  (11/13 0445) Resp:  [18] 18  (11/13 0445) BP: (96-114)/(62-68) 96/62 mmHg (11/13 0445) SpO2:  [97 %-98 %] 97 % (11/13 0445) Last BM Date: 04/10/11  Intake/Output from previous day: 11/12 0701 - 11/13 0700 In: 489 [P.O.:400; I.V.:89] Out: 1125 [Urine:975; Stool:150] Intake/Output this shift: Total I/O In: 240 [P.O.:240] Out: 325 [Urine:325]  General appearance: alert and no distress GI: normal findings: soft, non-tender  Lab Results:  No results found for this basename: WBC:2,HGB:2,HCT:2,PLT:2 in the last 72 hours BMET No results found for this basename: NA:2,K:2,CL:2,CO2:2,GLUCOSE:2,BUN:2,CREATININE:2,CALCIUM:2 in the last 72 hours PT/INR No results found for this basename: LABPROT:2,INR:2 in the last 72 hours ABG No results found for this basename: PHART:2,PCO2:2,PO2:2,HCO3:2 in the last 72 hours  Studies/Results: No results found.  Anti-infectives: Anti-infectives     Start     Dose/Rate Route Frequency Ordered Stop   04/02/11 1000   amoxicillin-clavulanate (AUGMENTIN) 875-125 MG per tablet 1 tablet  Status:  Discontinued        1 tablet Oral Every 12 hours 04/01/11 1036 04/10/11 0832   04/01/11 0000   fluconazole (DIFLUCAN) tablet 100 mg  Status:  Discontinued        100 mg Oral Daily 04/01/11 0005 04/10/11 9562          Assessment/Plan: Principal Problem:  *Stoma dermatitis leaking ostomy bag  Current bag working better but not sure we will get to the point that she can be discharged with her ileostomy.  If we can get a bag that works at home will discharge at that time.  If not will plan ileostomy reversal at six weeks post op (in 2.5 weeks) and will keep in hospital until then.  Discussed with pt    LOS: 11 days     Copeland Lapier T 04/11/2011  h

## 2011-04-11 NOTE — Consult Note (Signed)
Pt. Seen today to evaluate Eakin pouch for management of ileostomy.  Pt has had pouch in place since placed yesterday per partner [facility] nurse) Pouch with good seal at present.  Pt has red robinson cath in place to [facility] at to keep pouch empty at all times to attempt to allow seal in pouch for extended period of time for peristomal skin to heal.  Discussed with husband options for management with this technique at home.  Surgeon at bedside upon my arrival to discuss options for dc to home once we see if current management techniques will last over at least 48 hours.  If so may be candidate for [facility] to pouch every other day and to arrange for HHDME suction at home.  Surgeon has as well discussed these options with pt/CG.  Explained that pt needs to have management plan in place and with support of [facility] for teaching CG to apply pouch as well.  Discussed with bedside nursing staff that if pt to go off floor again today to make sure to try to suction out all output prior to travel to remove catheter from pouch and then reinsert once back on floor and that goal would be to keep to suction as much as possible to prevent leakage.  Also verified 2 additional large Eakin pouches and 2" barrier rings at bedside, req. Waterproof pink tape to be ordered to bedside for use should they have to change pouch when WOC not on campus and to re secure catheter into pouch with for best sea.  Staff verbalized understanding.  WOC department will follow.

## 2011-04-12 LAB — BASIC METABOLIC PANEL
CO2: 19 mEq/L (ref 19–32)
Calcium: 10.3 mg/dL (ref 8.4–10.5)
Chloride: 105 mEq/L (ref 96–112)
Glucose, Bld: 106 mg/dL — ABNORMAL HIGH (ref 70–99)
Sodium: 136 mEq/L (ref 135–145)

## 2011-04-12 NOTE — Progress Notes (Signed)
     Subjective: Better, sleeping.  Last pouch lasted 48hr wo leak, was change today and was dry  Objective: Vital signs in last 24 hours: Temp:  [98.4 F (36.9 C)-99.2 F (37.3 C)] 98.4 F (36.9 C) (11/14 0600) Pulse Rate:  [94-100] 94  (11/14 0600) Resp:  [18] 18  (11/14 0600) BP: (102-103)/(64-66) 103/66 mmHg (11/14 0600) SpO2:  [97 %-99 %] 99 % (11/14 0600) Last BM Date: 04/10/11  Intake/Output from previous day: 11/13 0701 - 11/14 0700 In: 441.5 [P.O.:240; I.V.:101.5] Out: 1875 [Urine:1225; Drains:650] Intake/Output this shift: Total I/O In: 240 [P.O.:240] Out: 200 [Urine:200]  Not examined, sleeping  Lab Results:   Basename 04/11/11 1120  WBC 7.1  HGB 11.3*  HCT 34.1*  PLT 217   BMET  Basename 04/12/11 0400  NA 136  K 4.3  CL 105  CO2 19  GLUCOSE 106*  BUN 27*  CREATININE 1.23*  CALCIUM 10.3   PT/INR No results found for this basename: LABPROT:2,INR:2 in the last 72 hours ABG No results found for this basename: PHART:2,PCO2:2,PO2:2,HCO3:2 in the last 72 hours  Studies/Results: No results found.  Anti-infectives: Anti-infectives     Start     Dose/Rate Route Frequency Ordered Stop   04/02/11 1000   amoxicillin-clavulanate (AUGMENTIN) 875-125 MG per tablet 1 tablet  Status:  Discontinued        1 tablet Oral Every 12 hours 04/01/11 1036 04/10/11 0832   04/01/11 0000   fluconazole (DIFLUCAN) tablet 100 mg  Status:  Discontinued        100 mg Oral Daily 04/01/11 0005 04/10/11 0832          Assessment/Plan: Improving.  May be able to discharge next week if progress continues     LOS: 12 days    Yariel Ferraris T 04/12/2011

## 2011-04-12 NOTE — Consult Note (Signed)
Ostomy follow-up:  Large Eakin pouch with cont wall suction held 48 hours, pt able to shower with pouch on.  This system appears to be working to protect skin, if suction can be arranged at home and pt has home health assistancet for pouch application.  Re-applied large Eakin pouch with barrier ring.  Previous pouch was leaking behind barrier and peristomal sjkin is very denuded in 4cm area surrounding. Protected with silicone spray and dusted with stoma powder. Stoma oval 11/2 inches, flush with skin level, points downward at 6 oclock, creating very difficult pouching situation.  Pt has become very familiar with pouch application routines and is able to talk staff through steps.  Supplies and instructions at bedside for nurses.  Pt declines use of cont suction at this time, has been disconnecting to walk in halls or go outside.  Suction and red rubber catheter available at bedside when she wants it inserted into pouch to assist with controlling watery brown stool.   Cammie Mcgee, RN, MSN, [facility]  [phone].

## 2011-04-13 ENCOUNTER — Encounter (INDEPENDENT_AMBULATORY_CARE_PROVIDER_SITE_OTHER): Payer: Commercial Indemnity | Admitting: General Surgery

## 2011-04-13 NOTE — Consults (Signed)
WOC ostomy consult  Pouch applied at 5:30pm yesterday is  still intact.  We have had greater success with pouches lasting in past 96 hours.  Patient and spouse encouraged.  Suction appears to be necessary adjunct to procedure and will be needed post discharge.  Supplies and ability of [facility] to perform pouching will be essential.  Will see tomorrow. Thanks, Ladona Mow, MSN, RN, [facility], CWOCN [phone])

## 2011-04-13 NOTE — Progress Notes (Signed)
  Subjective: No C/O, bag leaked without suction, now OK on suction  Objective: Vital signs in last 24 hours: Temp:  [98.4 F (36.9 C)-98.7 F (37.1 C)] 98.4 F (36.9 C) (11/15 0500) Pulse Rate:  [97-100] 100  (11/14 2100) Resp:  [18] 18  (11/15 0500) BP: (104-125)/(65-78) 104/65 mmHg (11/15 0500) SpO2:  [96 %-99 %] 96 % (11/15 0500) Last BM Date: 04/13/11  Intake/Output from previous day: 11/14 0701 - 11/15 0700 In: 240 [P.O.:240] Out: 1925 [Urine:1250; Stool:675] Intake/Output this shift:    General appearance: alert and no distress GI: normal findings: soft, non-tender  Lab Results:   Basename 04/11/11 1120  WBC 7.1  HGB 11.3*  HCT 34.1*  PLT 217   BMET  Basename 04/12/11 0400  NA 136  K 4.3  CL 105  CO2 19  GLUCOSE 106*  BUN 27*  CREATININE 1.23*  CALCIUM 10.3   PT/INR No results found for this basename: LABPROT:2,INR:2 in the last 72 hours ABG No results found for this basename: PHART:2,PCO2:2,PO2:2,HCO3:2 in the last 72 hours  Studies/Results: No results found.  Anti-infectives: Anti-infectives     Start     Dose/Rate Route Frequency Ordered Stop   04/02/11 1000   amoxicillin-clavulanate (AUGMENTIN) 875-125 MG per tablet 1 tablet  Status:  Discontinued        1 tablet Oral Every 12 hours 04/01/11 1036 04/10/11 0832   04/01/11 0000   fluconazole (DIFLUCAN) tablet 100 mg  Status:  Discontinued        100 mg Oral Daily 04/01/11 0005 04/10/11 0832          Assessment/Plan:  Overall better, possible discharge next week Slight increase BUN/Creat-push po fluids and observe   LOS: 13 days    Neill Jurewicz T 04/13/2011

## 2011-04-14 NOTE — Progress Notes (Signed)
     Subjective: Bags lasting greater than 48hr, pt reports skin much better  Objective: Vital signs in last 24 hours: Temp:  [97.9 F (36.6 C)-98.2 F (36.8 C)] 97.9 F (36.6 C) (11/16 0600) Pulse Rate:  [94-101] 101  (11/16 0600) Resp:  [18-20] 20  (11/16 0600) BP: (109-121)/(71-78) 109/73 mmHg (11/16 0600) SpO2:  [96 %-98 %] 97 % (11/16 0600) Last BM Date: 04/13/11  Intake/Output from previous day: 11/15 0701 - 11/16 0700 In: 1200 [P.O.:1200] Out: 2175 [Urine:2075; Stool:100] Intake/Output this shift:    General appearance: alert and no distress GI: normal findings: soft, non-tender, fistula bag with suction intact  Lab Results:   Basename 04/11/11 1120  WBC 7.1  HGB 11.3*  HCT 34.1*  PLT 217   BMET  Basename 04/12/11 0400  NA 136  K 4.3  CL 105  CO2 19  GLUCOSE 106*  BUN 27*  CREATININE 1.23*  CALCIUM 10.3   PT/INR No results found for this basename: LABPROT:2,INR:2 in the last 72 hours ABG No results found for this basename: PHART:2,PCO2:2,PO2:2,HCO3:2 in the last 72 hours  Studies/Results: No results found.  Anti-infectives: Anti-infectives     Start     Dose/Rate Route Frequency Ordered Stop   04/02/11 1000   amoxicillin-clavulanate (AUGMENTIN) 875-125 MG per tablet 1 tablet  Status:  Discontinued        1 tablet Oral Every 12 hours 04/01/11 1036 04/10/11 0832   04/01/11 0000   fluconazole (DIFLUCAN) tablet 100 mg  Status:  Discontinued        100 mg Oral Daily 04/01/11 0005 04/10/11 0832          Assessment/Plan: Current system working much better, anticipate discharge Monday     LOS: 14 days    Liliana Dang T 04/14/2011

## 2011-04-15 NOTE — Consult Note (Signed)
WOC ostomy consult  Pouch applied yesterday lasted approximately 24 hours.  Pouch off upon my arrival, there is denuded skin in the parastomal field from 3-9 o'clock and measuring 6cm.  Pectin powder applied to denuded tissue, sealed in with Marathon skin sealant.  4-inch barrier ring applied, then covered with Eakin (Large) pouch.  Red Robinson catheter used for low continuous suction (LCS).  Husband asking questions regarding plan for support following discharge such as will HHRN be able to perform pouch changes, will [facility] be able to supply products in a timely manner, etc.  Will need to see if Case Management is following so that they can investigate post acute support.  I will continue to follow with CCS. Ladona Mow, MSN, RN, [place], GNP [phone]

## 2011-04-15 NOTE — Progress Notes (Addendum)
  Subjective: Bag has not failed for 48 hours. Bag being changed now and I was able to examine this and it is looking better.  Objective: Vital signs in last 24 hours: Temp:  [97.9 F (36.6 C)-98.1 F (36.7 C)] 98.1 F (36.7 C) (11/17 0545) Pulse Rate:  [93-107] 98  (11/17 0545) Resp:  [18-20] 18  (11/17 0545) BP: (104-110)/(68-70) 105/68 mmHg (11/17 0545) SpO2:  [97 %-98 %] 98 % (11/17 0545) Last BM Date: 04/15/11  Intake/Output from previous day: 11/16 0701 - 11/17 0700 In: 960 [P.O.:960] Out: 3150 [Urine:2550; Stool:600] Intake/Output this shift: Total I/O In: 360 [P.O.:360] Out: 100 [Urine:100]  GI: abdomen is soft. The loop ileostomy in the right lower quadrant is pink and healthy. Soft pasty stool is coming out. Centrally there is a 10 cm x 8 cm area of erythema but it is much less inflamed without any purulence and appears to be improving. Otherwise the skin irritation is resolving nicely. Nursing is to  replace the bag now.  Lab Results:  No results found for this basename: WBC:2,HGB:2,HCT:2,PLT:2 in the last 72 hours BMET No results found for this basename: NA:2,K:2,CL:2,CO2:2,GLUCOSE:2,BUN:2,CREATININE:2,CALCIUM:2 in the last 72 hours PT/INR No results found for this basename: LABPROT:2,INR:2 in the last 72 hours ABG No results found for this basename: PHART:2,PCO2:2,PO2:2,HCO3:2 in the last 72 hours  Studies/Results: No results found.  Anti-infectives: Anti-infectives     Start     Dose/Rate Route Frequency Ordered Stop   04/02/11 1000   amoxicillin-clavulanate (AUGMENTIN) 875-125 MG per tablet 1 tablet  Status:  Discontinued        1 tablet Oral Every 12 hours 04/01/11 1036 04/10/11 0832   04/01/11 0000   fluconazole (DIFLUCAN) tablet 100 mg  Status:  Discontinued        100 mg Oral Daily 04/01/11 0005 04/10/11 0832          Assessment/Plan: s/p  continue current ostomy appliance management, which seems to be working and holding. If everything goes  well, will discharge in 48 hours on Monday, November 19.  LOS: 15 days    [name] M 04/15/2011

## 2011-04-16 NOTE — Progress Notes (Signed)
Cm spoke with pt concerning discharge planning. Per pt previously active with [facility] through [facility]. Pt would like to continue with agency but request [facility] with agency who has ostomy experience. CM spoke with Dr.Ingram concerning HH orders with specific ostomy instructions. Per Dr. Derrell Lolling awaiting instructions from [facility] RN. Pt will need HHRN and suction equipment orders. Have faxed H&P, Face sheet, and pertinent progress notes to [facility] to get process started. Awaiting [facility] HH orders.

## 2011-04-16 NOTE — Progress Notes (Signed)
  Subjective: She is doing well. New ostomy appliance and Eakin's pouch reapplied yesterday. No leakage over night. She feels fine. She states that she is not sure that home health nursing has arranged for all of her Coumadin needs at home yet.  Objective: Vital signs in last 24 hours: Temp:  [97.4 F (36.3 C)-98.2 F (36.8 C)] 98.2 F (36.8 C) (11/18 0600) Pulse Rate:  [92-94] 94  (11/18 0600) Resp:  [18-20] 20  (11/18 0600) BP: (90-99)/(58-68) 90/60 mmHg (11/18 0600) SpO2:  [95 %-98 %] 97 % (11/18 0600) Last BM Date: 04/15/11  Intake/Output from previous day: 11/17 0701 - 11/18 0700 In: 840 [P.O.:840] Out: 1700 [Urine:1400; Stool:300] Intake/Output this shift:    GI: abdomen is soft and nondistended. Loop ileostomy in the right lower quadrant is passing very soft stool. The pouch is intact and draining well. There is no abdominal tenderness.  Lab Results:  No results found for this basename: WBC:2,HGB:2,HCT:2,PLT:2 in the last 72 hours BMET No results found for this basename: NA:2,K:2,CL:2,CO2:2,GLUCOSE:2,BUN:2,CREATININE:2,CALCIUM:2 in the last 72 hours PT/INR No results found for this basename: LABPROT:2,INR:2 in the last 72 hours ABG No results found for this basename: PHART:2,PCO2:2,PO2:2,HCO3:2 in the last 72 hours  Studies/Results: No results found.  Anti-infectives: Anti-infectives     Start     Dose/Rate Route Frequency Ordered Stop   04/02/11 1000   amoxicillin-clavulanate (AUGMENTIN) 875-125 MG per tablet 1 tablet  Status:  Discontinued        1 tablet Oral Every 12 hours 04/01/11 1036 04/10/11 0832   04/01/11 0000   fluconazole (DIFLUCAN) tablet 100 mg  Status:  Discontinued        100 mg Oral Daily 04/01/11 0005 04/10/11 0832          Assessment/Plan:  continue current ostomy appliance management.  Discharge home, hopefully tomorrow, if all outpatient needs can be met.  I have reconsulted case management to make sure that all outpatient  arrangements are being made.  LOS: 16 days    Tien Aispuro M 04/16/2011

## 2011-04-17 LAB — BASIC METABOLIC PANEL
BUN: 26 mg/dL — ABNORMAL HIGH (ref 6–23)
CO2: 22 mEq/L (ref 19–32)
Chloride: 103 mEq/L (ref 96–112)
GFR calc Af Amer: 66 mL/min — ABNORMAL LOW (ref >90)
Glucose, Bld: 126 mg/dL — ABNORMAL HIGH (ref 70–99)
Potassium: 3.9 mEq/L (ref 3.5–5.1)

## 2011-04-17 NOTE — Progress Notes (Signed)
     Subjective: No C/O.  Current bag in place 24hr. Patient and family very apprehensive about ostomy care at home and management of leaking bag  Objective: Vital signs in last 24 hours: Temp:  [98.1 F (36.7 C)-98.6 F (37 C)] 98.5 F (36.9 C) (11/19 1157) Pulse Rate:  [97-99] 97  (11/19 1157) Resp:  [18-20] 18  (11/19 1157) BP: (98-109)/(65-71) 105/69 mmHg (11/19 1157) SpO2:  [94 %-99 %] 94 % (11/19 1157) Last BM Date: 04/17/11  Intake/Output from previous day: 11/18 0701 - 11/19 0700 In: 720 [P.O.:720] Out: 400 [Urine:400] Intake/Output this shift:    General appearance: alert and no distress GI: normal findings: soft, non-tender  Lab Results:  No results found for this basename: WBC:2,HGB:2,HCT:2,PLT:2 in the last 72 hours BMET No results found for this basename: NA:2,K:2,CL:2,CO2:2,GLUCOSE:2,BUN:2,CREATININE:2,CALCIUM:2 in the last 72 hours PT/INR No results found for this basename: LABPROT:2,INR:2 in the last 72 hours ABG No results found for this basename: PHART:2,PCO2:2,PO2:2,HCO3:2 in the last 72 hours  Studies/Results: No results found.  Anti-infectives: Anti-infectives     Start     Dose/Rate Route Frequency Ordered Stop   04/02/11 1000   amoxicillin-clavulanate (AUGMENTIN) 875-125 MG per tablet 1 tablet  Status:  Discontinued        1 tablet Oral Every 12 hours 04/01/11 1036 04/10/11 0832   04/01/11 0000   fluconazole (DIFLUCAN) tablet 100 mg  Status:  Discontinued        100 mg Oral Daily 04/01/11 0005 04/10/11 6295          Assessment/Plan: Principal Problem:  *Stoma dermatitis leaking ostomy bag Current system working pretty well in hospital but logistics of home care seem insurmountable. Unless current situation improves will not discharge, plan barium study of colon next week, then ostomy reversal soon after if no leak demonstrated  Will rechk bmet to F/U recent elevated BUN/Creat     LOS: 17 days    Phyliss Hulick T 04/17/2011

## 2011-04-17 NOTE — Consult Note (Signed)
WOC ostomy consult  Stoma type/location: right lower quadrant ileostomy  Stomal assessment/size: 7.8 inches x 1.5 inches  Peristomal assessment: full thickness denuded area from 3-9 o'clock extending 2-inches.  Minor scattered denuded areas in Eakin dressing field.  Midline incision completely reepithelialized.  Treatment options for stomal/peristomal skin: Dust with pectin powder.  Brush away excess.  Blot with no-sting barrier product. Place pectin barrier product (4-inch rings) over treated area.  Pouch on top of barrier.  Output: stool  (semi-liquid) depending on patient's PO intake.  Pouch incorporates red robinson catheter to low continuous suction.  Becomes clogged on occasion by food particles.  Irrigation of catheter frees blockage.   Ostomy pouching: 1pc.Eakin Fistula pouch (size large) is lasting on the average 24 hours.    I feel it may be time to consider sub-acute or LTAC placement.  Home care services, while desired, are for scheduled, intermittent visits.  Patient's spouse is unable to assist patient in reapplying this complex pouching system when leakages occur. Due to the unpredictable nature of the leaks, home care cannot be expected to respond on an unscheduled basis indefinitely. Additionally, I am uncertain how intermittent Gomco suction will perform as opposed to the continuous wall suction we have at this time.  I welcome the input of her surgical team and Case Manager on this issue.  I will continue to follow with you. Thanks, Ladona Mow, MSN, RN, [facility], CWOCN [phone])

## 2011-04-18 NOTE — Progress Notes (Signed)
     Subjective: Had one episode of leakage 12 hours ago.  None since.  Eating. Ambulating.  Objective: Vital signs in last 24 hours: Temp:  [97.7 F (36.5 C)-98.6 F (37 C)] 97.7 F (36.5 C) (11/19 2212) Pulse Rate:  [97-100] 98  (11/19 2212) Resp:  [18] 18  (11/19 2212) BP: (101-109)/(68-72) 109/72 mmHg (11/19 2212) SpO2:  [94 %-98 %] 98 % (11/19 2212) Last BM Date: 04/17/11  Intake/Output from previous day: 11/19 0701 - 11/20 0700 In: 120 [P.O.:120] Out: 750 [Urine:750] Intake/Output this shift: Total I/O In: 120 [P.O.:120] Out: 750 [Urine:750]  General appearance: alert and no distress GI: normal findings: soft, non-tender, coverage device intact with suction catheter in place.  Lab Results:  No results found for this basename: WBC:2,HGB:2,HCT:2,PLT:2 in the last 72 hours BMET  Basename 04/17/11 1340  NA 136  K 3.9  CL 103  CO2 22  GLUCOSE 126*  BUN 26*  CREATININE 1.06  CALCIUM 10.4   PT/INR No results found for this basename: LABPROT:2,INR:2 in the last 72 hours ABG No results found for this basename: PHART:2,PCO2:2,PO2:2,HCO3:2 in the last 72 hours  Studies/Results: No results found.  Anti-infectives: Anti-infectives     Start     Dose/Rate Route Frequency Ordered Stop   04/02/11 1000   amoxicillin-clavulanate (AUGMENTIN) 875-125 MG per tablet 1 tablet  Status:  Discontinued        1 tablet Oral Every 12 hours 04/01/11 1036 04/10/11 0832   04/01/11 0000   fluconazole (DIFLUCAN) tablet 100 mg  Status:  Discontinued        100 mg Oral Daily 04/01/11 0005 04/10/11 1610          Assessment/Plan: Principal Problem:  *Stoma dermatitis leaking ostomy bag Current system working pretty well in hospital but logistics of home care seem insurmountable. Unless current situation improves will not discharge, plan barium study of colon next week, then ostomy reversal soon after if no leak demonstrated      LOS: 18 days    [name]  J 04/18/2011

## 2011-04-18 NOTE — Consult Note (Signed)
WOC ostomy consult  Pouch changed over right lower quadrant ileostomy. Patient has just emerged from shower.   Parastomal area from 3-9 o'clock continues with denudement secondary to leakage after x number of hours (18-36 at this point). Patient and husband report that Dr. Abbey Chatters has provided an approximate timeline for testing the anastamosis with a goal toward reconnection surgery at the earliest date possible with a reasonable assurance of success. In the meantime, our plan is to continue to support her in the acute care setting with pouch changes as needed. I will follow with you. Ladona Mow, MSN, RN, [facility], CWOCN [phone])

## 2011-04-19 NOTE — Progress Notes (Signed)
  Subjective: No new issues.    Objective: Vital signs in last 24 hours: Temp:  [98.4 F (36.9 C)-98.5 F (36.9 C)] 98.5 F (36.9 C) (11/21 0600) Pulse Rate:  [94-98] 94  (11/21 0600) Resp:  [18] 18  (11/21 0600) BP: (100-107)/(69-71) 100/69 mmHg (11/21 0600) SpO2:  [96 %-97 %] 96 % (11/21 0600) Last BM Date: 04/18/11  Intake/Output from previous day: 11/20 0701 - 11/21 0700 In: 1020 [P.O.:1020] Out: 2375 [Urine:1750; Stool:625] Intake/Output this shift:    General appearance: alert, cooperative and no distress GI: RLQ ostomy back, no evidence of leakage Skin: Skin color, texture, turgor normal. No rashes or lesions or skin under ostomy bag not visible today but per ostomy nurse on yesterday's exam it seems to be improving  Lab Results:  No results found for this basename: WBC:2,HGB:2,HCT:2,PLT:2 in the last 72 hours BMET  Basename 04/17/11 1340  NA 136  K 3.9  CL 103  CO2 22  GLUCOSE 126*  BUN 26*  CREATININE 1.06  CALCIUM 10.4   PT/INR No results found for this basename: LABPROT:2,INR:2 in the last 72 hours ABG No results found for this basename: PHART:2,PCO2:2,PO2:2,HCO3:2 in the last 72 hours  Studies/Results: No results found.  Anti-infectives: Anti-infectives     Start     Dose/Rate Route Frequency Ordered Stop   04/02/11 1000   amoxicillin-clavulanate (AUGMENTIN) 875-125 MG per tablet 1 tablet  Status:  Discontinued        1 tablet Oral Every 12 hours 04/01/11 1036 04/10/11 0832   04/01/11 0000   fluconazole (DIFLUCAN) tablet 100 mg  Status:  Discontinued        100 mg Oral Daily 04/01/11 0005 04/10/11 0832          Assessment/Plan: s/p  continue current wound care.  plan for contrast study next week to eval for leak at anastamosis.  No clinical evidence of leak and she is doing very well.  LOS: 19 days    [name] [name] 04/19/2011

## 2011-04-20 ENCOUNTER — Encounter (HOSPITAL_COMMUNITY): Payer: Self-pay | Admitting: Surgery

## 2011-04-20 MED ORDER — ALUM & MAG HYDROXIDE-SIMETH 200-200-20 MG/5ML PO SUSP
30.0000 mL | Freq: Four times a day (QID) | ORAL | Status: DC | PRN
  Administered 2011-04-23: 30 mL via ORAL
  Filled 2011-04-20: qty 30

## 2011-04-20 MED ORDER — HYDROXYZINE HCL 25 MG PO TABS
25.0000 mg | ORAL_TABLET | Freq: Every evening | ORAL | Status: DC | PRN
  Administered 2011-04-21 – 2011-05-07 (×18): 25 mg via ORAL
  Filled 2011-04-20 (×38): qty 1

## 2011-04-20 MED ORDER — BISMUTH SUBSALICYLATE 262 MG/15ML PO SUSP
30.0000 mL | Freq: Two times a day (BID) | ORAL | Status: DC
  Administered 2011-04-20 – 2011-04-28 (×16): 30 mL via ORAL
  Filled 2011-04-20 (×4): qty 236

## 2011-04-20 MED ORDER — FLORA-Q PO CAPS
1.0000 | ORAL_CAPSULE | Freq: Every day | ORAL | Status: DC
  Administered 2011-04-20 – 2011-05-07 (×18): 1 via ORAL
  Filled 2011-04-20 (×19): qty 1

## 2011-04-20 MED ORDER — LOPERAMIDE HCL 2 MG PO CAPS
2.0000 mg | ORAL_CAPSULE | Freq: Every day | ORAL | Status: DC
  Administered 2011-04-20 – 2011-05-02 (×10): 2 mg via ORAL
  Filled 2011-04-20 (×19): qty 1

## 2011-04-20 MED ORDER — LOPERAMIDE HCL 2 MG PO CAPS: 2.0000 mg | ORAL_CAPSULE | Freq: Three times a day (TID) | ORAL | Status: DC | PRN

## 2011-04-20 MED ORDER — HYDROXYZINE HCL 10 MG PO TABS
10.0000 mg | ORAL_TABLET | Freq: Four times a day (QID) | ORAL | Status: DC | PRN
  Filled 2011-04-20: qty 3

## 2011-04-20 NOTE — Progress Notes (Signed)
PCP: Kristian Covey, MD  Outpatient Care Team: Patient Care Team: Kristian Covey as PCP - General  Inpatient Treatment Team: Treatment Team: Attending Provider: Mariella Saa, MD; Registered Nurse: Wyn Quaker, RN; Technician: Marlon Pel, NT; Registered Nurse: Sydell Axon, RN; Registered Nurse: Georga Bora, RN; Respiratory Therapist: Jaynie Crumble, RRT; Registered Nurse: Pervis Hocking, RN; Registered Nurse: Mliss Fritz, RN; Respiratory Therapist: Tyna Jaksch, RRT; Technician: Ula Lingo, NT   LOS: 20 days       Subjective:  Husband at bedside Tol PO well Using suction into giant ostomy pouch to help catch drainage & prevent leaking Burning at skin Bag falls of every 4-12 hours.  Longest is 48hours. Hoping for negative anastomotic leak test next week  Objective:  Vital signs:  Temp:  [97.8 F (36.6 C)-98.9 F (37.2 C)] 98.9 F (37.2 C) (11/22 0600) Pulse Rate:  [94-102] 101  (11/22 0600) Resp:  [16-18] 16  (11/22 0600) BP: (102-120)/(68-76) 106/70 mmHg (11/22 0600) SpO2:  [97 %-99 %] 98 % (11/22 0600) Last BM Date: 04/20/11  Intake/Output    from previous day: 11/21 0701 - 11/22 0700 In: 480 [P.O.:480] Out: 3000 [Urine:1875; Stool:1125]  this shift: Total I/O In: 120 [P.O.:120] Out: 300 [Urine:300]  Flatus: yes BM: thin liquid in bag  Physical Exam:  General: Pt awake/alert/oriented x4 in no acute distress Eyes: PERRL, normal EOM.  Sclera clear.  No icterus Neuro: CN II-XII intact w/o focal sensory/motor deficits. Lymph: No head/neck/groin lymphadenopathy Psych:  No delerium/psychosis/paranoia HENT: Normocephalic, Mucus membranes moist.  No thrush Neck: Supple, No tracheal deviation Chest: Clear.  No chest wall pain w good excursion CV:  Pulses intact.  Regular rhythm Abdomen: Soft, Nontender/Nondistended.  No incarcerated hernias.  Giant ostomy  appliance over much of ant abdomen, central ostomy w irreg cutout.   Ext:  SCDs BLE.  No mjr edema.  No cyanosis Skin: No petechiae / purpurae  Results:   Labs: No results found for this or any previous visit (from the past 48 hour(s)).  Imaging / Studies: @RISRSLT24 @  Antibiotics: Anti-infectives     Start     Dose/Rate Route Frequency Ordered Stop   04/02/11 1000   amoxicillin-clavulanate (AUGMENTIN) 875-125 MG per tablet 1 tablet  Status:  Discontinued        1 tablet Oral Every 12 hours 04/01/11 1036 04/10/11 0832   04/01/11 0000   fluconazole (DIFLUCAN) tablet 100 mg  Status:  Discontinued        100 mg Oral Daily 04/01/11 0005 04/10/11 0832          Medications / Allergies: per chart  Assessment / Plan: [name]  60 y.o. female    Problem List:  Principal Problem:  *Stoma dermatitis leaking ostomy bag  -cont pouching system - needs too great outside of inpatient setting  -cont WOCN imput  -consider belting / binder (they claim it failed in past)  -try antidiarrheals to get effluent <1 L a day & minimize exposure to skin Active Problems:  Diverticulosis of colon  -VTE prophylaxis- SCDs, etc -mobilize as tolerated to help recovery  Lorenso Courier, M.D., F.A.C.S. Gastrointestinal and Minimally Invasive Surgery [facility] [address] [place], [place] [address] [phone] Main / Paging [phone] Voice Mail   04/20/2011

## 2011-04-21 ENCOUNTER — Other Ambulatory Visit (INDEPENDENT_AMBULATORY_CARE_PROVIDER_SITE_OTHER): Payer: Self-pay | Admitting: Surgery

## 2011-04-21 MED ORDER — ACETAMINOPHEN 325 MG PO TABS: 325.0000 mg | ORAL_TABLET | Freq: Four times a day (QID) | ORAL | Status: DC | PRN

## 2011-04-21 MED ORDER — NAPROXEN 500 MG PO TABS
500.0000 mg | ORAL_TABLET | Freq: Two times a day (BID) | ORAL | Status: DC
  Administered 2011-04-22 – 2011-05-06 (×30): 500 mg via ORAL
  Filled 2011-04-21 (×33): qty 1

## 2011-04-21 MED ORDER — OXYCODONE HCL 5 MG PO TABS
5.0000 mg | ORAL_TABLET | ORAL | Status: DC | PRN
  Administered 2011-04-22 (×2): 5 mg via ORAL
  Administered 2011-04-23: 10 mg via ORAL
  Administered 2011-04-23 – 2011-04-24 (×6): 5 mg via ORAL
  Administered 2011-04-25 (×3): 10 mg via ORAL
  Administered 2011-04-26 (×2): 5 mg via ORAL
  Administered 2011-04-26 – 2011-05-05 (×14): 10 mg via ORAL
  Administered 2011-05-05: 5 mg via ORAL
  Administered 2011-05-05 – 2011-05-08 (×6): 10 mg via ORAL
  Filled 2011-04-21 (×2): qty 2
  Filled 2011-04-21: qty 1
  Filled 2011-04-21 (×2): qty 2
  Filled 2011-04-21: qty 1
  Filled 2011-04-21 (×2): qty 2
  Filled 2011-04-21: qty 1
  Filled 2011-04-21 (×2): qty 2
  Filled 2011-04-21: qty 1
  Filled 2011-04-21 (×2): qty 2
  Filled 2011-04-21: qty 1
  Filled 2011-04-21: qty 2
  Filled 2011-04-21: qty 1
  Filled 2011-04-21 (×4): qty 2
  Filled 2011-04-21 (×2): qty 1
  Filled 2011-04-21 (×3): qty 2
  Filled 2011-04-21: qty 1
  Filled 2011-04-21 (×3): qty 2
  Filled 2011-04-21: qty 1
  Filled 2011-04-21 (×5): qty 2
  Filled 2011-04-21: qty 1

## 2011-04-21 NOTE — Progress Notes (Signed)
Subjective: Waiting on new Ostomy bag, she is leaking again.  Pain 4/10 controlled with percocet.  Objective: Vital signs in last 24 hours: Temp:  [97.7 F (36.5 C)-98.8 F (37.1 C)] 98.4 F (36.9 C) (11/23 0500) Pulse Rate:  [97-104] 97  (11/23 0500) Resp:  [16-18] 18  (11/23 0500) BP: (89-99)/(55-66) 89/55 mmHg (11/23 0500) SpO2:  [94 %-98 %] 96 % (11/23 0500) Last BM Date: 04/20/11  Intake/Output from previous day: 11/22 0701 - 11/23 0700 In: 810 [P.O.:810] Out: 900 [Urine:700; Stool:200] Intake/Output this shift: Total I/O In: -  Out: 400 [Urine:200; Stool:200]  General appearance: alert, cooperative and no distress GI: soft, non-tender; bowel sounds normal; no masses,  no organomegaly and Huge ostomy bag with leakage, Husband showed me pictures of dermatitis and it is improving.  Lab Results:  No results found for this basename: WBC:2,HGB:2,HCT:2,PLT:2 in the last 72 hours  BMET No results found for this basename: NA:2,K:2,CL:2,CO2:2,GLUCOSE:2,BUN:2,CREATININE:2,CALCIUM:2 in the last 72 hours PT/INR No results found for this basename: LABPROT:2,INR:2 in the last 72 hours   Studies/Results: No results found.  Anti-infectives: Anti-infectives     Start     Dose/Rate Route Frequency Ordered Stop   04/02/11 1000   amoxicillin-clavulanate (AUGMENTIN) 875-125 MG per tablet 1 tablet  Status:  Discontinued        1 tablet Oral Every 12 hours 04/01/11 1036 04/10/11 0832   04/01/11 0000   fluconazole (DIFLUCAN) tablet 100 mg  Status:  Discontinued        100 mg Oral Daily 04/01/11 0005 04/10/11 0832         Current Facility-Administered Medications  Medication Dose Route Frequency Provider Last Rate Last Dose  . acetaminophen (TYLENOL) tablet 1,000 mg  1,000 mg Oral Q6H PRN Mariella Saa, MD   650 mg at 04/03/11 0430  . albuterol (PROVENTIL HFA;VENTOLIN HFA) 108 (90 BASE) MCG/ACT inhaler 2 puff  2 puff Inhalation Q6H PRN Lorenza Evangelist, PHARMD      .  alum & mag hydroxide-simeth (MAALOX/MYLANTA) 200-200-20 MG/5ML suspension 30 mL  30 mL Oral Q6H PRN Ardeth Sportsman, MD      . antiseptic oral rinse (BIOTENE) solution 15 mL  15 mL Mouth Rinse QID Hilario Quarry Amend, PHARMD   15 mL at 04/19/11 1611  . bismuth subsalicylate (PEPTO BISMOL) 262 MG/15ML suspension 30 mL  30 mL Oral BID Ardeth Sportsman, MD   30 mL at 04/20/11 1500  . chlorhexidine (PERIDEX) 0.12 % solution 15 mL  15 mL Mouth/Throat BID Hilario Quarry Amend, PHARMD   15 mL at 04/21/11 0827  . diphenhydrAMINE (BENADRYL) injection 12.5-25 mg  12.5-25 mg Intravenous Q6H PRN Lorenza Evangelist, PHARMD       Or  . diphenhydrAMINE (BENADRYL) 12.5 MG/5ML elixir 12.5-25 mg  12.5-25 mg Oral Q6H PRN Lorenza Evangelist, PHARMD      . Flora-Q (FLORA-Q) Capsule 1 capsule  1 capsule Oral Daily Ardeth Sportsman, MD   1 capsule at 04/20/11 1500  . heparin injection 5,000 Units  5,000 Units Subcutaneous [address], PHARMD   5,000 Units at 04/19/11 0554  . hydrOXYzine (ATARAX/VISTARIL) tablet 10-25 mg  10-25 mg Oral Q6H PRN Ardeth Sportsman, MD      . hydrOXYzine (ATARAX/VISTARIL) tablet 25 mg  25 mg Oral QHS,MR X 1 Ardeth Sportsman, MD   25 mg at 04/21/11 0025  . levothyroxine (SYNTHROID, LEVOTHROID) tablet 137 mcg  137 mcg Oral QAC breakfast Lorenza Evangelist,  PHARMD   137 mcg at 04/21/11 0827  . loperamide (IMODIUM) capsule 2 mg  2 mg Oral QHS Ardeth Sportsman, MD   2 mg at 04/20/11 2228  . loperamide (IMODIUM) capsule 2-4 mg  2-4 mg Oral Q8H PRN Ardeth Sportsman, MD      . losartan (COZAAR) tablet 50 mg  50 mg Oral Daily Lorenza Evangelist, PHARMD   50 mg at 04/20/11 1006  . meloxicam (MOBIC) tablet 15 mg  15 mg Oral QHS Mariella Saa, MD   15 mg at 04/20/11 2307  . montelukast (SINGULAIR) tablet 10 mg  10 mg Oral Daily Lorenza Evangelist, PHARMD   10 mg at 04/20/11 1006  . ondansetron (ZOFRAN) injection 4 mg  4 mg Intravenous Q4H PRN Lorenza Evangelist, PHARMD      . oxyCODONE-acetaminophen  (PERCOCET) 5-325 MG per tablet 1-2 tablet  1-2 tablet Oral Q4H PRN Mariella Saa, MD   2 tablet at 04/20/11 2231  . sertraline (ZOLOFT) tablet 100 mg  100 mg Oral QHS Mariella Saa, MD   100 mg at 04/19/11 2249  . zolpidem (AMBIEN) tablet 10 mg  10 mg Oral QHS Hilario Quarry Amend, PHARMD   10 mg at 04/19/11 2249    Assessment/Plan 10/03-total abd colectomy w stapled ileoproctostomy. 10/20 Anastomotic leak with pelvic abscess, exploratory lap, drainage of abscess, 2 pelvic drains and loop ileostomy. 11/02- admitted with leaking ileostomy appliance, and skin care issues, now with ongoing stoma dermatitis and appliance issues; high ostomy output   Patient Active Problem List  Diagnoses  . VIRAL INFECTION  . HYPOTHYROIDISM  . HYPERLIPIDEMIA  . DEPRESSION  . HYPERTENSION  . SINUSITIS, ACUTE  . ALLERGIC RHINITIS  . Diverticulosis of colon  . NEPHROLITHIASIS  . SHOULDER PAIN, RIGHT  . FATIGUE  . ABDOMINAL PAIN, EPIGASTRIC  . Stoma dermatitis leaking ostomy bag  Plan:  Continue in hospital care of ostomy, aim for closure if the anastomotic leak is resolved.  Study next week.   LOS: 21 days    [name] 04/21/2011

## 2011-04-21 NOTE — Progress Notes (Signed)
Pt  Is refusing her heparin shots.  She reported to me that a previous nurse told her she no longer needed them because she was ambulating frequently.  I explained to pt that as long as they were still ordered she needed them.  At that point she refused it.  According to the [facility] she has been refusing them for about 24 hours.

## 2011-04-21 NOTE — Progress Notes (Addendum)
PCP: Kristian Covey, MD  Outpatient Care Team: Patient Care Team: Kristian Covey as PCP - General Mariella Saa, MD as Consulting Physician (General Surgery) Yancey Flemings, MD as Consulting Physician (Gastroenterology)  Inpatient Treatment Team: Treatment Team: Attending Provider: Mariella Saa, MD; Registered Nurse: Wyn Quaker, RN; Technician: Marlon Pel, NT; Registered Nurse: Sydell Axon, RN; Registered Nurse: Georga Bora, RN; Respiratory Therapist: Jaynie Crumble, RRT; Registered Nurse: Pervis Hocking, RN; Registered Nurse: Mliss Fritz, RN; Respiratory Therapist: Tyna Jaksch, RRT; Technician: Ula Lingo, NT; Registered Nurse: Bethann Goo, RN; Technician: Vella Raring, NT; Registered Nurse: Holland Commons, RN  Patient still requires frequent changes of her ileostomy bag many times a day and massive skin irritation & breakdown of most of her anterior abdominal wall. She has failed outpatient management with readmission to better control her deteriorating skin and growing wound. WOCN and surgical RNs involved closely. I see no possibility of daily visit Home Health handling this at this time. I am very skeptical that a SNF could, either.   LTAC may be better option IF there is experienced WOCN / RN care available 24hr for leaking, etc.  We will explore if those options are possible   Lorenso Courier, M.D., F.A.C.S. Gastrointestinal and Minimally Invasive Surgery [facility] [address] [place], [place] [address] [phone] Main / Paging [phone] Voice Mail   04/21/2011

## 2011-04-21 NOTE — Consult Note (Signed)
WOC consult Note Reason for Consult:Patient seen in regular/routine follow-up; Dr. Michaell Cowing has requested teaching of ostomy management to patient and family today.  Please sees note of 04/17/11. Patient and husband are unable to manage stoma at home. Pouch failures are approximately every 24-36 hours at this point.   Anti-diarrheals (PeptoBismal and Lomotil) are thickening stool, suction is no longer required/effective. New pouch has just been applied today after leakage this am overpowered barrier.  Additional supplies brought to room (silicone adhesive remover spray, several packets of silicone adhesive barrier film and 4-inch "arc" barriers) for weekend pouch changes by staff.   Our team will continue to follow with you. Thanks, Ladona Mow, MSN, RN, [facility], CWOCN [phone])

## 2011-04-22 LAB — URINE MICROSCOPIC-ADD ON

## 2011-04-22 LAB — URINALYSIS, ROUTINE W REFLEX MICROSCOPIC
Glucose, UA: NEGATIVE mg/dL
Specific Gravity, Urine: 1.018 (ref 1.005–1.030)

## 2011-04-22 NOTE — Progress Notes (Signed)
POD# 54  Assessment/Plan:   1.  Stoma dermatitis leaking ostomy bag (04/02/2011)  10/03-total abd colectomy w stapled ileoproctostomy.  10/20 Anastomotic leak with pelvic abscess, exploratory lap, drainage of abscess, 2 pelvic drains and loop ileostomy.  11/02- admitted with leaking ileostomy appliance, and skin care issues, now with ongoing stoma dermatitis and appliance issues; high ostomy output.  Complex fistula management requiring in hospital management.  -cont pouching system - needs too great outside of inpatient setting  -cont WOCN imput   2. Diverticulosis of colon (11/10/2008)  3.  VTE - SCD  4.  Burning on urination.  Will check UA.  5.  Other dxes:  VIRAL INFECTION    HYPOTHYROIDISM   HYPERLIPIDEMIA   DEPRESSION    HYPERTENSION      ALLERGIC RHINITIS    NEPHROLITHIASIS   SHOULDER PAIN, RIGHT    ABDOMINAL PAIN, EPIGASTRIC      LOS: 22 days   Subjective:  Good Spirits.  Urine burning.  Objective:   Filed Vitals:   04/22/11 0610  BP: 112/71  Pulse: 104  Temp: 98.7 F (37.1 C)  Resp: 18     Intake/Output from previous day:  11/23 0701 - 11/24 0700 In: 960 [P.O.:960] Out: 2000 [Urine:1200; Stool:800]  Intake/Output this shift:  Total I/O In: -  Out: 650 [Urine:650]   Physical Exam:   General: WN WF who is alert and generally healthy appearing.    HEENT: Normal. Pupils equal. Good dentition. .   Lungs: Clear   Abdomen: covered with large pouch to control fistula.  This leaked this am and required changing.   Wound: see above   Neurologic:  Grossly intact to motor and sensory function.   Psychiatric: Has normal mood and affect. Behavior is normal   Lab Results:   No results found for this basename: WBC:2,HGB:2,HCT:2,PLT:2 in the last 72 hours  BMET  No results found for this basename: NA:2,K:2,CL:2,CO2:2,GLUCOSE:2,BUN:2,CREATININE:2,CALCIUM:2 in the last 72 hours  PT/INR  No results found for this basename: LABPROT:2,INR:2 in the last 72  hours  ABG  No results found for this basename: PHART:2,PCO2:2,PO2:2,HCO3:2 in the last 72 hours   Studies/Results:  No results found.   Anti-infectives:   Anti-infectives     Start     Dose/Rate Route Frequency Ordered Stop   04/02/11 1000   amoxicillin-clavulanate (AUGMENTIN) 875-125 MG per tablet 1 tablet  Status:  Discontinued        1 tablet Oral Every 12 hours 04/01/11 1036 04/10/11 0832   04/01/11 0000   fluconazole (DIFLUCAN) tablet 100 mg  Status:  Discontinued        100 mg Oral Daily 04/01/11 0005 04/10/11 0832           Ovidio Kin, MD, FACS Pager: [phone],   [facility] Office: [phone] 04/22/2011

## 2011-04-23 MED ORDER — SULFAMETHOXAZOLE-TMP DS 800-160 MG PO TABS
1.0000 | ORAL_TABLET | Freq: Two times a day (BID) | ORAL | Status: DC
  Administered 2011-04-23 – 2011-04-27 (×9): 1 via ORAL
  Filled 2011-04-23 (×10): qty 1

## 2011-04-23 NOTE — Progress Notes (Signed)
POD# 55  Assessment/Plan:   1.  Stoma dermatitis leaking ostomy bag (04/02/2011) *10/03-total abd colectomy w stapled ileoproctostomy.  *10/20 Anastomotic leak with pelvic abscess, exploratory lap, drainage of abscess, 2 pelvic drains and loop ileostomy.  *11/02- admitted with leaking ileostomy appliance, and skin care issues, now with ongoing stoma dermatitis and appliance issues; high ostomy output.  Complex fistula management requiring in hospital management.  Pouch changed at 4 AM today.  -cont pouching system - needs too great outside of inpatient setting  -cont WOCN imput   2. Diverticulosis of colon (11/10/2008)  3.  VTE - SCD  4.  Still burning on urination.  Will check UA. UA with WBC and bacteria - will treat with Septra until cultures arrive.  5.  Other dxes:  VIRAL INFECTION    HYPOTHYROIDISM   HYPERLIPIDEMIA   DEPRESSION  HYPERTENSION       ALLERGIC RHINITIS   NEPHROLITHIASIS   SHOULDER PAIN, RIGHT   ABDOMINAL PAIN, EPIGASTRIC     LOS: 23 days   Subjective:  Good Spirits.  Urine burning.  Objective:   Filed Vitals:   04/23/11 0617  BP: 102/67  Pulse: 100  Temp: 98.1 F (36.7 C)  Resp: 18     Intake/Output from previous day:  11/24 0701 - 11/25 0700 In: 1860 [P.O.:1860] Out: 2675 [Urine:1750; Stool:925]  Intake/Output this shift:      Physical Exam:   General: WN WF who is alert and generally healthy appearing.    HEENT: Normal. Pupils equal. Good dentition. .   Lungs: Clear   Abdomen: covered with large pouch to control fistula.  This leaked this am and required changing.   Wound: see above   Neurologic:  Grossly intact to motor and sensory function.   Psychiatric: Has normal mood and affect. Behavior is normal   Lab Results:   No results found for this basename: WBC:2,HGB:2,HCT:2,PLT:2 in the last 72 hours  BMET  No results found for this basename: NA:2,K:2,CL:2,CO2:2,GLUCOSE:2,BUN:2,CREATININE:2,CALCIUM:2 in the last 72 hours  PT/INR  No  results found for this basename: LABPROT:2,INR:2 in the last 72 hours  ABG  No results found for this basename: PHART:2,PCO2:2,PO2:2,HCO3:2 in the last 72 hours   Studies/Results:  No results found.   Anti-infectives:   Anti-infectives     Start     Dose/Rate Route Frequency Ordered Stop   04/02/11 1000   amoxicillin-clavulanate (AUGMENTIN) 875-125 MG per tablet 1 tablet  Status:  Discontinued        1 tablet Oral Every 12 hours 04/01/11 1036 04/10/11 0832   04/01/11 0000   fluconazole (DIFLUCAN) tablet 100 mg  Status:  Discontinued        100 mg Oral Daily 04/01/11 0005 04/10/11 0832           Ovidio Kin, MD, FACS Pager: [phone],   [facility] Office: [phone] 04/23/2011

## 2011-04-24 MED ORDER — ONDANSETRON HCL 4 MG PO TABS
4.0000 mg | ORAL_TABLET | Freq: Three times a day (TID) | ORAL | Status: DC | PRN
  Administered 2011-04-24 – 2011-04-27 (×2): 4 mg via ORAL
  Filled 2011-04-24 (×2): qty 1

## 2011-04-24 NOTE — Progress Notes (Signed)
Patient ID: Infantof Villagomez, female   DOB: [birth year], 60 y.o.   MRN: [id number]    Subjective: No new C/O.  Bags lasting 12-24hr.  Objective: Vital signs in last 24 hours: Temp:  [98 F (36.7 C)-98.4 F (36.9 C)] 98.3 F (36.8 C) (11/26 0600) Pulse Rate:  [71-110] 71  (11/26 0600) Resp:  [18-20] 18  (11/26 0600) BP: (90-108)/(63-71) 103/67 mmHg (11/26 0600) SpO2:  [97 %] 97 % (11/25 2200) Last BM Date: 04/23/11  Intake/Output from previous day: 11/25 0701 - 11/26 0700 In: 1080 [P.O.:1080] Out: 2875 [Urine:1450; Stool:1425] Intake/Output this shift: Total I/O In: 200 [P.O.:200] Out: -   General appearance: alert and no distress GI: normal findings: soft, non-tender  Lab Results:  No results found for this basename: WBC:2,HGB:2,HCT:2,PLT:2 in the last 72 hours BMET No results found for this basename: NA:2,K:2,CL:2,CO2:2,GLUCOSE:2,BUN:2,CREATININE:2,CALCIUM:2 in the last 72 hours PT/INR No results found for this basename: LABPROT:2,INR:2 in the last 72 hours ABG No results found for this basename: PHART:2,PCO2:2,PO2:2,HCO3:2 in the last 72 hours  Studies/Results: No results found.  Anti-infectives: Anti-infectives     Start     Dose/Rate Route Frequency Ordered Stop   04/23/11 1030  sulfamethoxazole-trimethoprim (BACTRIM DS) 800-160 MG per tablet 1 tablet       1 tablet Oral Every 12 hours 04/23/11 0953     04/02/11 1000   amoxicillin-clavulanate (AUGMENTIN) 875-125 MG per tablet 1 tablet  Status:  Discontinued        1 tablet Oral Every 12 hours 04/01/11 1036 04/10/11 0832   04/01/11 0000   fluconazole (DIFLUCAN) tablet 100 mg  Status:  Discontinued        100 mg Oral Daily 04/01/11 0005 04/10/11 4098          Assessment/Plan: Principal Problem:  *Stoma dermatitis leaking ostomy bag  Requires inpatient care due to skin care and frequent appliance changes.  Will proceed with contrast study of anastamosis in anticipation of possible ileostomy reversal  within next 1-2 weeks      LOS: 24 days    Long Brimage T 04/24/2011

## 2011-04-24 NOTE — Progress Notes (Signed)
Pt continues to c/o abd discomfort. Abd is tender to touch and painful. Pt is in tears verbalizing "it hasn't hurt this bad since day one". Paged Dr. Gerrit Friends to make him aware of pt's discomfort. PRN medication given to aid with discomfort. Offered warm compress. Dr. Gerrit Friends returned the page. NO new orders given. Will continue to closely monitor.

## 2011-04-25 ENCOUNTER — Inpatient Hospital Stay (HOSPITAL_COMMUNITY): Payer: Commercial Indemnity

## 2011-04-25 MED ORDER — IOHEXOL 300 MG/ML  SOLN
450.0000 mL | Freq: Once | INTRAMUSCULAR | Status: AC | PRN
  Administered 2011-04-25: 450 mL via INTRAVENOUS

## 2011-04-25 NOTE — Consult Note (Signed)
Ostomy follow-up:  Large [place] pouch was applied this AM by bedside nurse.  Intact with good seal, mod brown unformed stool in pouch.  Supplies and instructions at bedside.  Pt and husband familiar with pouching routines.  Pt anxiously awaiting stomal reversal surgery, not scheduled at this time.  Provided support and listened to concerns. Ostomy team will continue to follow  Thank-you  Cammie Mcgee, RN, MSN, [employer]  [phone]

## 2011-04-25 NOTE — Progress Notes (Signed)
CSW meet with the pt and her husband. Psychosocial assessment completed (pls see shadow chart). Pt reports she is feeling much better. Pt ileostomy bag has been leaking and caused irration to the pts skin. Pt husband reports they tried home with home health in the past but the level of care needed is not offered. Pt and her husband report they are waiting to see Dr. Johna Sheriff today and see what the results of her test were. Pt and her husband report they were informed that if the pt is doing okay she will have a reversal surgery done next week and the pt will remain in the hospital until the surgery is complete. At this time the CSW does not have a consult from the physician to assess of fax out the pt for SNF or LTACH. CSW spoke with CM Windell Moulding and care coordinator Clarissa who both report that the physician wants the pt to continue to be at the acute level of care because of complications she has had with the ileostomy. Pt and her husband very pleasant. CSW informed before assessing the pt that she isvery emotional when you begin to discuss discharge plans to a SNF or LTACH because she is afraid that her skin will become infected and she will have to be readmitted to the hospital.  CSW notes in prior note the following:  Stoma dermatitis leaking ostomy bag  Requires inpatient care due to skin care and frequent appliance changes.  Will proceed with contrast study of anastamosis in anticipation of possible ileostomy reversal within next 1-2 weeks  LOS: 24 days  HOXWORTH,BENJAMIN T  04/24/2011  Please advise CSW if SNF or LTACH needs to be presented to the pt and husband.  CSW will continue to offer support and help with appropriate discharge planning.    Patrice Paradise, LCSWA 04/25/2011 4:05 PM [phone]

## 2011-04-25 NOTE — Progress Notes (Signed)
Patient ID: [name], female   DOB: Aug 16, 1950, 60 y.o.   MRN: [id number]    Subjective: Some abd pain yesterday with Septra but this resolved.  No complaints currently.  Bag still with occ leak  Objective: Vital signs in last 24 hours: Temp:  [98.2 F (36.8 C)-98.6 F (37 C)] 98.2 F (36.8 C) (11/27 1400) Pulse Rate:  [101-111] 104  (11/27 1400) Resp:  [18-20] 18  (11/27 1400) BP: (99-105)/(62-65) 105/64 mmHg (11/27 1400) SpO2:  [95 %-100 %] 95 % (11/27 1400) Last BM Date: 04/25/11  Intake/Output from previous day: 11/26 0701 - 11/27 0700 In: 400 [P.O.:400] Out: 1801 [Urine:1375; Stool:426] Intake/Output this shift: Total I/O In: 240 [P.O.:240] Out: 440 [Urine:340; Stool:100]  General appearance: alert and no distress GI: normal findings: soft, non-tender, midline wound well healed, about 5cm rim of skin excoriation around stoma, stable  Lab Results:  No results found for this basename: WBC:2,HGB:2,HCT:2,PLT:2 in the last 72 hours BMET No results found for this basename: NA:2,K:2,CL:2,CO2:2,GLUCOSE:2,BUN:2,CREATININE:2,CALCIUM:2 in the last 72 hours PT/INR No results found for this basename: LABPROT:2,INR:2 in the last 72 hours ABG No results found for this basename: PHART:2,PCO2:2,PO2:2,HCO3:2 in the last 72 hours  Studies/Results: Dg Colon W/water Sol Cm  04/25/2011  *RADIOLOGY REPORT*  Clinical Data: History of ileoanal anastomotic leak.  Evaluate for residual leak.  SINGLE CONTRAST ENEMA  Technique: Single contrast enema was performed using both dilute Omnipaque and barium.  Comparison:  CT of 03/17/2011.  Findings: Pre procedure scout film demonstrates left renal calculi. Nonobstructive bowel gas pattern.  Phleboliths in the pelvis.  Both water-soluble and barium contrast injection demonstrates surgical changes of ileoanal anastomoses.   No contrast extravasation to suggest residual leak.  No pericolonic contrast collection.  Post evacuation films demonstrate no  contrast remaining outside the anus.  IMPRESSION: No evidence of residual anastomotic leak.  Original Report Authenticated By: Consuello Bossier, M.D.    Anti-infectives: Anti-infectives     Start     Dose/Rate Route Frequency Ordered Stop   04/23/11 1030  sulfamethoxazole-trimethoprim (BACTRIM DS) 800-160 MG per tablet 1 tablet       1 tablet Oral Every 12 hours 04/23/11 0953     04/02/11 1000   amoxicillin-clavulanate (AUGMENTIN) 875-125 MG per tablet 1 tablet  Status:  Discontinued        1 tablet Oral Every 12 hours 04/01/11 1036 04/10/11 0832   04/01/11 0000   fluconazole (DIFLUCAN) tablet 100 mg  Status:  Discontinued        100 mg Oral Daily 04/01/11 0005 04/10/11 0832          Assessment/Plan:Principal Problem:  *Stoma dermatitis leaking ostomy bag    BE shows normal anastamosis without leak.  Should be OK to proceed with ostomy takedown. Will plan this prob next week.  Discussed with patient as\nd husband   LOS: 25 days    [name] T 04/25/2011

## 2011-04-27 NOTE — Progress Notes (Signed)
Patient ID: [name], female   DOB: [birth year], 60 y.o.   MRN: [id number]    Subjective: C/O some nausea, no pain  Objective: Vital signs in last 24 hours: Temp:  [97.3 F (36.3 C)-98.3 F (36.8 C)] 97.3 F (36.3 C) (11/29 1427) Pulse Rate:  [89-99] 97  (11/29 1427) Resp:  [18] 18  (11/29 1427) BP: (94-98)/(60-63) 94/60 mmHg (11/29 1427) SpO2:  [97 %-99 %] 97 % (11/29 1427) Last BM Date: 04/26/11  Intake/Output from previous day: 11/28 0701 - 11/29 0700 In: 240 [P.O.:240] Out: 1600 [Urine:900; Stool:700] Intake/Output this shift: Total I/O In: 200 [P.O.:200] Out: 200 [Urine:200]  General appearance: alert and no distress GI: normal findings: soft, non-tender  Lab Results:  No results found for this basename: WBC:2,HGB:2,HCT:2,PLT:2 in the last 72 hours BMET No results found for this basename: NA:2,K:2,CL:2,CO2:2,GLUCOSE:2,BUN:2,CREATININE:2,CALCIUM:2 in the last 72 hours PT/INR No results found for this basename: LABPROT:2,INR:2 in the last 72 hours ABG No results found for this basename: PHART:2,PCO2:2,PO2:2,HCO3:2 in the last 72 hours  Studies/Results: No results found.  Anti-infectives: Anti-infectives     Start     Dose/Rate Route Frequency Ordered Stop   04/23/11 1030  sulfamethoxazole-trimethoprim (BACTRIM DS) 800-160 MG per tablet 1 tablet       1 tablet Oral Every 12 hours 04/23/11 0953     04/02/11 1000   amoxicillin-clavulanate (AUGMENTIN) 875-125 MG per tablet 1 tablet  Status:  Discontinued        1 tablet Oral Every 12 hours 04/01/11 1036 04/10/11 0832   04/01/11 0000   fluconazole (DIFLUCAN) tablet 100 mg  Status:  Discontinued        100 mg Oral Daily 04/01/11 0005 04/10/11 0832          Assessment/Plan: Principal Problem:  *Stoma dermatitis leaking ostomy bag Stable Nausea ? Secondary to Septra.  Will D/C and re Cx urine Plan ileostomy takedown 05/02/2011      LOS: 27 days    [name] 04/27/2011

## 2011-04-27 NOTE — Consult Note (Signed)
Large Eakin pouch in place for current wear time at 14 hours applied yesterday evening per bedside nurse.  Intact with good seal, liq. Brown stool in pouch.  Pt is being very diligent to get up to empty to prevent leakage as much as possible. . Supplies and instructions at bedside, large Eakin pouches verified at the nursing station for use.  Pt and husband familiar with pouching routines. Pt reports takedown surgery scheduled for next Tuesday, she is very excited to have this surgery.  Provided emotional support.  Ostomy team will continue to follow  [facility] RN, CWOCN [phone])

## 2011-04-28 LAB — URINALYSIS, ROUTINE W REFLEX MICROSCOPIC
Bilirubin Urine: NEGATIVE
Glucose, UA: NEGATIVE mg/dL
Ketones, ur: NEGATIVE mg/dL
Protein, ur: NEGATIVE mg/dL

## 2011-04-28 LAB — URINE MICROSCOPIC-ADD ON

## 2011-04-28 MED ORDER — CIPROFLOXACIN HCL 500 MG PO TABS
500.0000 mg | ORAL_TABLET | Freq: Two times a day (BID) | ORAL | Status: DC
  Administered 2011-04-28 – 2011-05-08 (×20): 500 mg via ORAL
  Filled 2011-04-28 (×21): qty 1

## 2011-04-28 NOTE — Progress Notes (Signed)
Patient ID: [name], female   DOB: 01-Jul-1950, 60 y.o.   MRN: [id number]    Subjective: Feels better today, nausea improved  Objective: Vital signs in last 24 hours: Temp:  [97.3 F (36.3 C)-98.5 F (36.9 C)] 98 F (36.7 C) (11/30 0600) Pulse Rate:  [91-97] 96  (11/30 0600) Resp:  [18] 18  (11/30 0600) BP: (94-113)/(59-75) 113/75 mmHg (11/30 0600) SpO2:  [97 %-98 %] 98 % (11/30 0600) Last BM Date: 04/28/11  Intake/Output from previous day: 11/29 0701 - 11/30 0700 In: 520 [P.O.:520] Out: 2525 [Urine:2250; Stool:275] Intake/Output this shift: Total I/O In: 120 [P.O.:120] Out: 350 [Urine:350]  General appearance: alert and no distress GI: normal findings: soft, non-tender  Lab Results:  U/A still with 20-30 WBC  No results found for this basename: WBC:2,HGB:2,HCT:2,PLT:2 in the last 72 hours BMET No results found for this basename: NA:2,K:2,CL:2,CO2:2,GLUCOSE:2,BUN:2,CREATININE:2,CALCIUM:2 in the last 72 hours PT/INR No results found for this basename: LABPROT:2,INR:2 in the last 72 hours ABG No results found for this basename: PHART:2,PCO2:2,PO2:2,HCO3:2 in the last 72 hours  Studies/Results: No results found.  Anti-infectives: Anti-infectives     Start     Dose/Rate Route Frequency Ordered Stop   04/23/11 1030   sulfamethoxazole-trimethoprim (BACTRIM DS) 800-160 MG per tablet 1 tablet  Status:  Discontinued        1 tablet Oral Every 12 hours 04/23/11 0953 04/27/11 1721   04/02/11 1000   amoxicillin-clavulanate (AUGMENTIN) 875-125 MG per tablet 1 tablet  Status:  Discontinued        1 tablet Oral Every 12 hours 04/01/11 1036 04/10/11 0832   04/01/11 0000   fluconazole (DIFLUCAN) tablet 100 mg  Status:  Discontinued        100 mg Oral Daily 04/01/11 0005 04/10/11 0832          Assessment/Plan: Principal Problem:  *Stoma dermatitis leaking ostomy bag Apparent UTI Will ger C&S urine Cipro po For ileostomy takedown 12/4      LOS: 28 days     [name] T 04/28/2011

## 2011-04-29 HISTORY — PX: DIVERTING ILEOSTOMY: SHX5799

## 2011-04-29 NOTE — Progress Notes (Signed)
Subjective:     She feels fine. She is in good spirits. She says the bag is holding pretty well.  Objective:      Abdomen soft nontender. Complex ostomy appliance appears to be well secured. There is no                                    odor                         Urinalysis is positive for leukocytes but negative for nitrite. Posterior pending  Assessment: stoma dermatitis from leaking ostomy bag.                          Possible UTI, urine C&S pending. On Cipro by mouth.  Plan:               for ileostomy takedown December 4.  Ernestene Mention 04/29/2011 3:05 PM

## 2011-04-30 LAB — URINE CULTURE: Culture  Setup Time: 201212010052

## 2011-04-30 NOTE — Progress Notes (Signed)
  Subjective: No complaints  Objective: Vital signs in last 24 hours: Temp:  [98 F (36.7 C)-98.3 F (36.8 C)] 98 F (36.7 C) (12/02 0450) Pulse Rate:  [90-97] 90  (12/02 0450) Resp:  [18] 18  (12/02 0450) BP: (101-108)/(64-68) 102/65 mmHg (12/02 0450) SpO2:  [95 %-98 %] 95 % (12/02 0450) Last BM Date: 04/29/11  Intake/Output from previous day: 12/01 0701 - 12/02 0700 In: 1080 [P.O.:1080] Out: 2025 [Urine:1100; Stool:925] Intake/Output this shift:    General appearance: alert, cooperative and no distress Resp: clear to auscultation bilaterally Cardio: regular rate and rhythm, S1, S2 normal, no murmur, click, rub or gallop GI: complex ostomy appliance applied with semisolid output, no evidence of leakage  Lab Results:  No results found for this basename: WBC:2,HGB:2,HCT:2,PLT:2 in the last 72 hours BMET No results found for this basename: NA:2,K:2,CL:2,CO2:2,GLUCOSE:2,BUN:2,CREATININE:2,CALCIUM:2 in the last 72 hours PT/INR No results found for this basename: LABPROT:2,INR:2 in the last 72 hours ABG No results found for this basename: PHART:2,PCO2:2,PO2:2,HCO3:2 in the last 72 hours  Studies/Results: No results found.  Anti-infectives: Anti-infectives     Start     Dose/Rate Route Frequency Ordered Stop   04/28/11 1400   ciprofloxacin (CIPRO) tablet 500 mg        500 mg Oral 2 times daily 04/28/11 1207     04/23/11 1030   sulfamethoxazole-trimethoprim (BACTRIM DS) 800-160 MG per tablet 1 tablet  Status:  Discontinued        1 tablet Oral Every 12 hours 04/23/11 0953 04/27/11 1721   04/02/11 1000   amoxicillin-clavulanate (AUGMENTIN) 875-125 MG per tablet 1 tablet  Status:  Discontinued        1 tablet Oral Every 12 hours 04/01/11 1036 04/10/11 0832   04/01/11 0000   fluconazole (DIFLUCAN) tablet 100 mg  Status:  Discontinued        100 mg Oral Daily 04/01/11 0005 04/10/11 0832          Assessment/Plan: s/p Procedure(s): ILEOSTOMY TAKEDOWN plan for ostomy  takedown on Tuesday  LOS: 30 days    [name] [name] 04/30/2011

## 2011-05-01 LAB — BASIC METABOLIC PANEL
CO2: 19 mEq/L (ref 19–32)
Calcium: 10.9 mg/dL — ABNORMAL HIGH (ref 8.4–10.5)
Glucose, Bld: 107 mg/dL — ABNORMAL HIGH (ref 70–99)
Potassium: 5.3 mEq/L — ABNORMAL HIGH (ref 3.5–5.1)
Sodium: 133 mEq/L — ABNORMAL LOW (ref 135–145)

## 2011-05-01 LAB — DIFFERENTIAL
Eosinophils Relative: 6 % — ABNORMAL HIGH (ref 0–5)
Lymphocytes Relative: 33 % (ref 12–46)
Lymphs Abs: 2.6 10*3/uL (ref 0.7–4.0)

## 2011-05-01 LAB — CBC
MCV: 88.2 fL (ref 78.0–100.0)
Platelets: 317 10*3/uL (ref 150–400)
RBC: 3.72 MIL/uL — ABNORMAL LOW (ref 3.87–5.11)
WBC: 8 10*3/uL (ref 4.0–10.5)

## 2011-05-01 MED ORDER — CIPROFLOXACIN IN D5W 400 MG/200ML IV SOLN
400.0000 mg | INTRAVENOUS | Status: AC
  Administered 2011-05-02: 400 mg via INTRAVENOUS
  Filled 2011-05-01 (×3): qty 200

## 2011-05-01 NOTE — Progress Notes (Signed)
Patient ID: [name], female   DOB: 06/08/1950, 60 y.o.   MRN: [id number]    Subjective: A little tired, no other C/O  Objective: Vital signs in last 24 hours: Temp:  [97.7 F (36.5 C)-98 F (36.7 C)] 98 F (36.7 C) (12/03 0405) Pulse Rate:  [94-97] 94  (12/03 0405) Resp:  [18-20] 18  (12/03 0405) BP: (95-113)/(60-68) 113/68 mmHg (12/03 0405) SpO2:  [96 %-99 %] 99 % (12/03 0405) Last BM Date: 04/30/11  Intake/Output from previous day: 12/02 0701 - 12/03 0700 In: 840 [P.O.:840] Out: 3150 [Urine:2275; Drains:300; Stool:575] Intake/Output this shift: Total I/O In: 480 [P.O.:480] Out: 875 [Urine:525; Drains:300; Stool:50]  General appearance: alert and no distress GI: normal findings: soft, non-tender and fistula bag intact  Lab Results:  No results found for this basename: WBC:2,HGB:2,HCT:2,PLT:2 in the last 72 hours BMET No results found for this basename: NA:2,K:2,CL:2,CO2:2,GLUCOSE:2,BUN:2,CREATININE:2,CALCIUM:2 in the last 72 hours PT/INR No results found for this basename: LABPROT:2,INR:2 in the last 72 hours ABG No results found for this basename: PHART:2,PCO2:2,PO2:2,HCO3:2 in the last 72 hours  Studies/Results: No results found.  Anti-infectives: Anti-infectives     Start     Dose/Rate Route Frequency Ordered Stop   04/28/11 1400   ciprofloxacin (CIPRO) tablet 500 mg        500 mg Oral 2 times daily 04/28/11 1207     04/23/11 1030   sulfamethoxazole-trimethoprim (BACTRIM DS) 800-160 MG per tablet 1 tablet  Status:  Discontinued        1 tablet Oral Every 12 hours 04/23/11 0953 04/27/11 1721   04/02/11 1000   amoxicillin-clavulanate (AUGMENTIN) 875-125 MG per tablet 1 tablet  Status:  Discontinued        1 tablet Oral Every 12 hours 04/01/11 1036 04/10/11 0832   04/01/11 0000   fluconazole (DIFLUCAN) tablet 100 mg  Status:  Discontinued        100 mg Oral Daily 04/01/11 0005 04/10/11 0832          Assessment/Plan: Stable, for ileostomy takedown  tomrrow All patient's and husbands questions answered    LOS: 31 days    [name] 05/01/2011

## 2011-05-01 NOTE — Consult Note (Signed)
WOC consult Note Reason for Consult: Preoperative stoma site selection (2)  Two sites requested by [facility] surgical team ([facility]) in advance of scheduled OR tomorrow for re-anastamosis:  Right upper quadrant ileostomy and left lower quadrant colostomy.  RUQ Ileostomy site marked at 5cm above unbilicus and 11cm to right.  LLQ Colostomy site marked at 2cm below umbilicus and 7cm to left.   Both sites covered with thin film transparent dressing. Lengthy visit to lift patient's spirits and calm nerves prior to procedure.  Patient's husband phone while I was in room and while nervous, he too, is looking forward to a positive outcome. I will remain available to this patient and her surgical team. Ladona Mow MSN, RN, [facility], CWOCN [phone])

## 2011-05-02 ENCOUNTER — Encounter (HOSPITAL_COMMUNITY): Payer: Self-pay | Admitting: Anesthesiology

## 2011-05-02 ENCOUNTER — Other Ambulatory Visit (INDEPENDENT_AMBULATORY_CARE_PROVIDER_SITE_OTHER): Payer: Self-pay | Admitting: General Surgery

## 2011-05-02 ENCOUNTER — Encounter (HOSPITAL_COMMUNITY)
Admission: EM | Disposition: A | Discharge status: Home / Self Care | Payer: Self-pay | Source: Home / Self Care | Attending: General Surgery

## 2011-05-02 ENCOUNTER — Encounter (HOSPITAL_COMMUNITY): Payer: Self-pay

## 2011-05-02 ENCOUNTER — Inpatient Hospital Stay (HOSPITAL_COMMUNITY): Payer: Commercial Indemnity | Admitting: Anesthesiology

## 2011-05-02 DIAGNOSIS — Z432 Encounter for attention to ileostomy: Secondary | ICD-10-CM

## 2011-05-02 HISTORY — PX: ILEOSTOMY CLOSURE: SHX1784

## 2011-05-02 SURGERY — CLOSURE, ILEOSTOMY
Anesthesia: General | Site: Abdomen | Wound class: Clean Contaminated

## 2011-05-02 MED ORDER — PROPOFOL 10 MG/ML IV EMUL
INTRAVENOUS | Status: DC | PRN
  Administered 2011-05-02: 200 mg via INTRAVENOUS

## 2011-05-02 MED ORDER — FENTANYL CITRATE 0.05 MG/ML IJ SOLN
INTRAMUSCULAR | Status: AC
  Filled 2011-05-02: qty 2

## 2011-05-02 MED ORDER — FENTANYL CITRATE 0.05 MG/ML IJ SOLN
INTRAMUSCULAR | Status: DC | PRN
  Administered 2011-05-02 (×2): 50 ug via INTRAVENOUS
  Administered 2011-05-02: 100 ug via INTRAVENOUS
  Administered 2011-05-02: 50 ug via INTRAVENOUS

## 2011-05-02 MED ORDER — GLYCOPYRROLATE 0.2 MG/ML IJ SOLN
INTRAMUSCULAR | Status: DC | PRN
  Administered 2011-05-02: .6 mg via INTRAVENOUS

## 2011-05-02 MED ORDER — ROCURONIUM BROMIDE 100 MG/10ML IV SOLN
INTRAVENOUS | Status: DC | PRN
  Administered 2011-05-02: 40 mg via INTRAVENOUS

## 2011-05-02 MED ORDER — POTASSIUM CHLORIDE IN NACL 20-0.9 MEQ/L-% IV SOLN
INTRAVENOUS | Status: DC
  Administered 2011-05-02 – 2011-05-03 (×3): via INTRAVENOUS
  Filled 2011-05-02 (×5): qty 1000

## 2011-05-02 MED ORDER — ONDANSETRON HCL 4 MG/2ML IJ SOLN
INTRAMUSCULAR | Status: DC | PRN
  Administered 2011-05-02: 4 mg via INTRAVENOUS

## 2011-05-02 MED ORDER — DEXAMETHASONE SODIUM PHOSPHATE 10 MG/ML IJ SOLN
INTRAMUSCULAR | Status: DC | PRN
  Administered 2011-05-02: 10 mg via INTRAVENOUS

## 2011-05-02 MED ORDER — HEPARIN SODIUM (PORCINE) 5000 UNIT/ML IJ SOLN
5000.0000 [IU] | Freq: Three times a day (TID) | INTRAMUSCULAR | Status: DC
  Administered 2011-05-03 – 2011-05-08 (×15): 5000 [IU] via SUBCUTANEOUS
  Filled 2011-05-02 (×19): qty 1

## 2011-05-02 MED ORDER — BUPIVACAINE-EPINEPHRINE PF 0.25-1:200000 % IJ SOLN
INTRAMUSCULAR | Status: DC | PRN
  Administered 2011-05-02: 30 mL

## 2011-05-02 MED ORDER — HYDROMORPHONE HCL PF 2 MG/ML IJ SOLN
INTRAMUSCULAR | Status: AC
  Administered 2011-05-02: 1 mg
  Filled 2011-05-02: qty 1

## 2011-05-02 MED ORDER — PROMETHAZINE HCL 25 MG/ML IJ SOLN: 6.2500 mg | INTRAMUSCULAR | Status: DC | PRN

## 2011-05-02 MED ORDER — NEOSTIGMINE METHYLSULFATE 1 MG/ML IJ SOLN
INTRAMUSCULAR | Status: DC | PRN
  Administered 2011-05-02: 4 mg via INTRAVENOUS

## 2011-05-02 MED ORDER — FENTANYL CITRATE 0.05 MG/ML IJ SOLN
25.0000 ug | INTRAMUSCULAR | Status: DC | PRN
  Administered 2011-05-02 (×3): 25 ug via INTRAVENOUS

## 2011-05-02 MED ORDER — MORPHINE SULFATE 2 MG/ML IJ SOLN
2.0000 mg | INTRAMUSCULAR | Status: DC | PRN
  Administered 2011-05-02: 2 mg via INTRAVENOUS

## 2011-05-02 MED ORDER — SODIUM CHLORIDE 0.9 % IR SOLN
Status: DC | PRN
  Administered 2011-05-02: 1000 mL

## 2011-05-02 MED ORDER — LACTATED RINGERS IV SOLN: INTRAVENOUS | Status: DC

## 2011-05-02 MED ORDER — MIDAZOLAM HCL 5 MG/5ML IJ SOLN
INTRAMUSCULAR | Status: DC | PRN
  Administered 2011-05-02: 2 mg via INTRAVENOUS

## 2011-05-02 MED ORDER — HYDROMORPHONE HCL PF 1 MG/ML IJ SOLN
0.2500 mg | INTRAMUSCULAR | Status: DC | PRN
  Administered 2011-05-02 (×4): 0.5 mg via INTRAVENOUS

## 2011-05-02 MED ORDER — LACTATED RINGERS IV SOLN
INTRAVENOUS | Status: DC | PRN
  Administered 2011-05-02: 13:00:00 via INTRAVENOUS

## 2011-05-02 MED ORDER — LIDOCAINE HCL (CARDIAC) 20 MG/ML IV SOLN
INTRAVENOUS | Status: DC | PRN
  Administered 2011-05-02: 100 mg via INTRAVENOUS

## 2011-05-02 SURGICAL SUPPLY — 56 items
APPLICATOR COTTON TIP 6IN STRL (MISCELLANEOUS) ×4 IMPLANT
BANDAGE GAUZE ELAST BULKY 4 IN (GAUZE/BANDAGES/DRESSINGS) ×1 IMPLANT
BLADE EXTENDED COATED 6.5IN (ELECTRODE) IMPLANT
BLADE HEX COATED 2.75 (ELECTRODE) ×2 IMPLANT
BLADE SURG SZ10 CARB STEEL (BLADE) ×2 IMPLANT
CANISTER SUCTION 2500CC (MISCELLANEOUS) ×2 IMPLANT
CLIP TI LARGE 6 (CLIP) IMPLANT
CLOTH BEACON ORANGE TIMEOUT ST (SAFETY) ×2 IMPLANT
COVER MAYO STAND STRL (DRAPES) ×2 IMPLANT
DRAPE LAPAROSCOPIC ABDOMINAL (DRAPES) ×2 IMPLANT
DRAPE LG THREE QUARTER DISP (DRAPES) IMPLANT
DRAPE UTILITY 15X26 (DRAPE) ×1 IMPLANT
DRAPE WARM FLUID 44X44 (DRAPE) ×2 IMPLANT
DRSG PAD ABDOMINAL 8X10 ST (GAUZE/BANDAGES/DRESSINGS) ×2 IMPLANT
ELECT REM PT RETURN 9FT ADLT (ELECTROSURGICAL) ×2
ELECTRODE REM PT RTRN 9FT ADLT (ELECTROSURGICAL) ×1 IMPLANT
GAUZE KERLIX 2  STERILE LF (GAUZE/BANDAGES/DRESSINGS) ×1 IMPLANT
GLOVE BIOGEL PI IND STRL 7.0 (GLOVE) ×1 IMPLANT
GLOVE BIOGEL PI INDICATOR 7.0 (GLOVE) ×1
GLOVE ECLIPSE 8.0 STRL XLNG CF (GLOVE) ×2 IMPLANT
GLOVE INDICATOR 8.0 STRL GRN (GLOVE) ×2 IMPLANT
GOWN STRL NON-REIN LRG LVL3 (GOWN DISPOSABLE) ×2 IMPLANT
GOWN STRL REIN XL XLG (GOWN DISPOSABLE) ×4 IMPLANT
HAND ACTIVATED (MISCELLANEOUS) IMPLANT
KIT BASIN OR (CUSTOM PROCEDURE TRAY) ×2 IMPLANT
LEGGING LITHOTOMY PAIR STRL (DRAPES) ×1 IMPLANT
NS IRRIG 1000ML POUR BTL (IV SOLUTION) ×4 IMPLANT
PACK GENERAL/GYN (CUSTOM PROCEDURE TRAY) ×2 IMPLANT
RELOAD PROXIMATE 75MM BLUE (ENDOMECHANICALS) IMPLANT
RELOAD STAPLE 75 3.8 BLU REG (ENDOMECHANICALS) IMPLANT
SPONGE GAUZE 4X4 12PLY (GAUZE/BANDAGES/DRESSINGS) ×2 IMPLANT
STAPLER GUN LINEAR PROX 60 (STAPLE) ×1 IMPLANT
STAPLER PROXIMATE 75MM BLUE (STAPLE) ×1 IMPLANT
STAPLER VISISTAT 35W (STAPLE) ×2 IMPLANT
SUCTION POOLE TIP (SUCTIONS) ×2 IMPLANT
SUT CHROMIC 2 0 SH (SUTURE) IMPLANT
SUT NOV 1 T60/GS (SUTURE) IMPLANT
SUT NOVA 1 T20/GS 25DT (SUTURE) ×2 IMPLANT
SUT NOVA NAB DX-16 0-1 5-0 T12 (SUTURE) IMPLANT
SUT NOVA T20/GS 25 (SUTURE) IMPLANT
SUT PDS AB 1 CTX 36 (SUTURE) ×4 IMPLANT
SUT PROLENE 2 0 KS (SUTURE) IMPLANT
SUT SILK 2 0 (SUTURE) ×4
SUT SILK 2 0 SH CR/8 (SUTURE) ×2 IMPLANT
SUT SILK 2 0SH CR/8 30 (SUTURE) IMPLANT
SUT SILK 2-0 18XBRD TIE 12 (SUTURE) ×1 IMPLANT
SUT SILK 2-0 30XBRD TIE 12 (SUTURE) IMPLANT
SUT SILK 3 0 (SUTURE) ×2
SUT SILK 3 0 SH CR/8 (SUTURE) ×3 IMPLANT
SUT SILK 3-0 18XBRD TIE 12 (SUTURE) ×1 IMPLANT
SUT VIC AB 3-0 SH 18 (SUTURE) ×2 IMPLANT
TAPE HYPAFIX 4 X30'CHARGABLE (GAUZE/BANDAGES/DRESSINGS) ×1 IMPLANT
TOWEL OR 17X26 10 PK STRL BLUE (TOWEL DISPOSABLE) ×4 IMPLANT
TRAY FOLEY CATH 14FRSI W/METER (CATHETERS) ×2 IMPLANT
WATER STERILE IRR 1500ML POUR (IV SOLUTION) IMPLANT
YANKAUER SUCT BULB TIP NO VENT (SUCTIONS) ×2 IMPLANT

## 2011-05-02 NOTE — Anesthesia Postprocedure Evaluation (Signed)
  Anesthesia Post-op Note  Patient: [name]  Procedure(s) Performed:  ILEOSTOMY TAKEDOWN  Patient Location: PACU  Anesthesia Type: General  Level of Consciousness: awake and alert   Airway and Oxygen Therapy: Patient Spontanous Breathing  Post-op Pain: mild  Post-op Assessment: Post-op Vital signs reviewed, Patient's Cardiovascular Status Stable, Respiratory Function Stable, Patent Airway and No signs of Nausea or vomiting  Post-op Vital Signs: stable  Complications: No apparent anesthesia complications

## 2011-05-02 NOTE — Transfer of Care (Signed)
Immediate Anesthesia Transfer of Care Note  Patient: [name]  Procedure(s) Performed:  ILEOSTOMY TAKEDOWN  Patient Location: PACU  Anesthesia Type: General  Level of Consciousness: awake, alert , oriented and patient cooperative  Airway & Oxygen Therapy: Patient Spontanous Breathing and Patient connected to face mask oxygen  Post-op Assessment: Report given to PACU RN, Post -op Vital signs reviewed and stable and Patient moving all extremities X 4  Post vital signs: Reviewed and stable  Complications: No apparent anesthesia complications

## 2011-05-02 NOTE — Op Note (Signed)
  Surgeon: Glenna Fellows T   Assistants: Harriette Bouillon M.D.  Anesthesia: General endotracheal anesthesia  Indications: status post diverting ileostomy    Procedure Detail: patient is brought to the operating room, placed in the supine position on the operating table, and general endotracheal anesthesia is induced. The ostomy device in the right lower quadrant was removed and the skin completely cleaned and then sterilely prepped and draped. Patient timeout was performed the correct patient and procedure verified. She had received preoperative IV antibiotics. PAS port in place. The loop ileostomy was elliptically excised at the skin level and dissection was carried down into the subcutaneous tissue using cautery. Using blunt, cautery, and sharp dissection, the stoma was completely freed away from the surrounding subcutaneous tissue down to the level of the fascia. The free peritoneal cavity was entered on one edge and there were remarkably few adhesions around the stoma to the anterior abdominal wall. Under direct vision the bowel was freed from the abdominal wall in all directions. At this point we could bring the stoma and plenty of proximal and distal ileum up through the wound for resection and anastomosis. The bowel away from the stoma appeared completely normal. The mesentery of the stoma itself was divided between clamps and tied with 2-0 silk ties. Following this a functional end-to-end anastomosis was created with a single firing of the GIA 75 mm stapler. The staple line was inspected and was intact and without bleeding. The common enterotomy was closed and the specimen removed with a single firing of the TA 60 stapler. The anastomosis was seen to be widely patent and was airtight on compression. The small mesenteric defect was closed with interrupted silks in interrupted silk was placed at the crotch of the anastomosis. The bowel was returned to the abdominal cavity. Again there were no  adhesions near the stoma site. The fascia appeared in good condition and was closed transversely with interrupted 0 Novafil sutures. The skin and subcutaneous tissue was left open and packed with moist saline gauze. Sponge needle and instrument counts were correct. The patient was taken to recovery in good condition.   Estimated Blood Loss:  less than 50 mL         Drains: None       Specimens: Ileostomy         Complications:  * No complications entered in OR log *         Disposition: PACU - hemodynamically stable.         Condition: stable  Mariella Saa MD, FACS  05/02/2011, 2:56 PM

## 2011-05-02 NOTE — Preoperative (Signed)
Beta Blockers   Reason not to administer Beta Blockers:Not Applicable 

## 2011-05-02 NOTE — Progress Notes (Signed)
Patient ID: [name], female   DOB: 12/26/1950, 60 y.o.   MRN: [id number] Day of Surgery  Subjective: No C/O  Objective: Vital signs in last 24 hours: Temp:  [97.7 F (36.5 C)-99.3 F (37.4 C)] 98.6 F (37 C) (12/04 1201) Pulse Rate:  [93-101] 93  (12/04 1201) Resp:  [18-20] 18  (12/04 1201) BP: (104-113)/(71-77) 105/71 mmHg (12/04 1201) SpO2:  [97 %-99 %] 98 % (12/04 1201) Last BM Date: 05/01/11  Intake/Output from previous day: 12/03 0701 - 12/04 0700 In: 480 [P.O.:480] Out: 950 [Urine:800; Stool:150] Intake/Output this shift: Total I/O In: -  Out: 575 [Urine:225; Stool:350]  General appearance: alert and no distress GI: normal findings: soft, non-tender  Lab Results:   [facility] 05/01/11 0745  WBC 8.0  HGB 10.8*  HCT 32.8*  PLT 317   BMET  Basename 05/01/11 0745  NA 133*  K 5.3*  CL 104  CO2 19  GLUCOSE 107*  BUN 25*  CREATININE 1.30*  CALCIUM 10.9*   PT/INR No results found for this basename: LABPROT:2,INR:2 in the last 72 hours ABG No results found for this basename: PHART:2,PCO2:2,PO2:2,HCO3:2 in the last 72 hours  Studies/Results: No results found.  Anti-infectives: Anti-infectives     Start     Dose/Rate Route Frequency Ordered Stop   05/02/11 0600   ciprofloxacin (CIPRO) IVPB 400 mg        400 mg 200 mL/hr over 60 Minutes Intravenous 120 min pre-op 05/01/11 1930     04/28/11 1400   ciprofloxacin (CIPRO) tablet 500 mg        500 mg Oral 2 times daily 04/28/11 1207     04/23/11 1030   sulfamethoxazole-trimethoprim (BACTRIM DS) 800-160 MG per tablet 1 tablet  Status:  Discontinued        1 tablet Oral Every 12 hours 04/23/11 0953 04/27/11 1721   04/02/11 1000   amoxicillin-clavulanate (AUGMENTIN) 875-125 MG per tablet 1 tablet  Status:  Discontinued        1 tablet Oral Every 12 hours 04/01/11 1036 04/10/11 0832   04/01/11 0000   fluconazole (DIFLUCAN) tablet 100 mg  Status:  Discontinued        100 mg Oral Daily 04/01/11 0005  04/10/11 0832          Assessment/Plan: Stable.  For ileostomy take down today.  All questions answered    LOS: 32 days    [name] T 05/02/2011

## 2011-05-02 NOTE — Anesthesia Preprocedure Evaluation (Addendum)
Anesthesia Evaluation  Patient identified by MRN, date of birth, ID band Patient awake    Reviewed: Allergy & Precautions, H&P , NPO status , Patient's Chart, lab work & pertinent test results  Airway Mallampati: II TM Distance: >3 FB Neck ROM: Full    Dental No notable dental hx.    Pulmonary neg pulmonary ROS,  clear to auscultation  Pulmonary exam normal       Cardiovascular hypertension, Pt. on medications Regular Normal    Neuro/Psych PSYCHIATRIC DISORDERS Negative Neurological ROS     GI/Hepatic negative GI ROS, Neg liver ROS,   Endo/Other  Negative Endocrine ROSHypothyroidism   Renal/GU negative Renal ROS  Genitourinary negative   Musculoskeletal negative musculoskeletal ROS (+)   Abdominal   Peds negative pediatric ROS (+)  Hematology negative hematology ROS (+)   Anesthesia Other Findings   Reproductive/Obstetrics negative OB ROS                           Anesthesia Physical Anesthesia Plan  ASA: II  Anesthesia Plan: General   Post-op Pain Management:    Induction: Intravenous  Airway Management Planned: Oral ETT  Additional Equipment:   Intra-op Plan:   Post-operative Plan: Extubation in OR  Informed Consent: I have reviewed the patients History and Physical, chart, labs and discussed the procedure including the risks, benefits and alternatives for the proposed anesthesia with the patient or authorized representative who has indicated his/her understanding and acceptance.   Dental advisory given  Plan Discussed with: CRNA  Anesthesia Plan Comments:         Anesthesia Quick Evaluation

## 2011-05-03 ENCOUNTER — Encounter (HOSPITAL_COMMUNITY): Payer: Self-pay | Admitting: General Surgery

## 2011-05-03 LAB — BASIC METABOLIC PANEL
CO2: 19 mEq/L (ref 19–32)
Glucose, Bld: 113 mg/dL — ABNORMAL HIGH (ref 70–99)
Potassium: 5.5 mEq/L — ABNORMAL HIGH (ref 3.5–5.1)
Sodium: 134 mEq/L — ABNORMAL LOW (ref 135–145)

## 2011-05-03 LAB — CBC
Hemoglobin: 9.2 g/dL — ABNORMAL LOW (ref 12.0–15.0)
Platelets: 316 10*3/uL (ref 150–400)
RBC: 3.22 MIL/uL — ABNORMAL LOW (ref 3.87–5.11)
WBC: 9.5 10*3/uL (ref 4.0–10.5)

## 2011-05-03 MED ORDER — SODIUM CHLORIDE 0.9 % IV SOLN
INTRAVENOUS | Status: DC
  Administered 2011-05-03 – 2011-05-05 (×4): via INTRAVENOUS

## 2011-05-03 NOTE — Consult Note (Signed)
WOC ostomy consult  Patient and spouse visited today s/p re-anastamosis surgery. In good spirits; intermittent abdominal discomforts consistent with post op course. I will no longer follow, but will remain available to this patient and her medical/surgical team as needed. Thanks for allowing me to assist with this very nice patient. Ladona Mow, MSN, RN, GNP, CWOCN [phone])

## 2011-05-03 NOTE — Progress Notes (Signed)
Pt is alert and oriented, Vital signs are stable, pt tolerating diet without complaints of nausea or vomiting, dressing to abdomen is clean dry and intact, bowel sounds are active, pt up and ambulating, foley removed and patient voiding adequately, husband at bedside and supportive, will continue to monitor Means, [name] N 05-03-11 15:14

## 2011-05-03 NOTE — Progress Notes (Signed)
Patient ID: [name], female   DOB: 1950-11-20, 60 y.o.   MRN: [id number] 1 Day Post-Op  Subjective: Moderate incisional pain, controlled with meds.  No nausea on CL. Ambulatory  Objective: Vital signs in last 24 hours: Temp:  [97.3 F (36.3 C)-98.6 F (37 C)] 98 F (36.7 C) (12/05 1000) Pulse Rate:  [88-109] 96  (12/05 1000) Resp:  [16-18] 18  (12/05 1000) BP: (93-109)/(52-72) 93/52 mmHg (12/05 1000) SpO2:  [97 %-100 %] 98 % (12/05 1000) Last BM Date: 05/02/11  Intake/Output from previous day: 12/04 0701 - 12/05 0700 In: 3272 [P.O.:240; I.V.:3032] Out: 1375 [Urine:975; Stool:350; Blood:50] Intake/Output this shift: Total I/O In: 1140 [P.O.:240; I.V.:900] Out: 850 [Urine:850]  General appearance: alert and no distress GI: normal findings: soft, non-tender generally with mild tenderness around incision Incision/Wound:  Dressed, clean and dry  Lab Results:   Basename 05/03/11 0400 05/01/11 0745  WBC 9.5 8.0  HGB 9.2* 10.8*  HCT 28.0* 32.8*  PLT 316 317   BMET  Basename 05/03/11 0400 05/01/11 0745  NA 134* 133*  K 5.5* 5.3*  CL 106 104  CO2 19 19  GLUCOSE 113* 107*  BUN 25* 25*  CREATININE 1.20* 1.30*  CALCIUM 9.9 10.9*   PT/INR No results found for this basename: LABPROT:2,INR:2 in the last 72 hours ABG No results found for this basename: PHART:2,PCO2:2,PO2:2,HCO3:2 in the last 72 hours  Studies/Results: No results found.  Anti-infectives: Anti-infectives     Start     Dose/Rate Route Frequency Ordered Stop   05/02/11 0600   ciprofloxacin (CIPRO) IVPB 400 mg        400 mg 200 mL/hr over 60 Minutes Intravenous 120 min pre-op 05/01/11 1930 05/02/11 1339   04/28/11 1400   ciprofloxacin (CIPRO) tablet 500 mg        500 mg Oral 2 times daily 04/28/11 1207     04/23/11 1030   sulfamethoxazole-trimethoprim (BACTRIM DS) 800-160 MG per tablet 1 tablet  Status:  Discontinued        1 tablet Oral Every 12 hours 04/23/11 0953 04/27/11 1721   04/02/11  1000   amoxicillin-clavulanate (AUGMENTIN) 875-125 MG per tablet 1 tablet  Status:  Discontinued        1 tablet Oral Every 12 hours 04/01/11 1036 04/10/11 0832   04/01/11 0000   fluconazole (DIFLUCAN) tablet 100 mg  Status:  Discontinued        100 mg Oral Daily 04/01/11 0005 04/10/11 0832          Assessment/Plan: s/p Procedure(s): ILEOSTOMY TAKEDOWN Doing well post op Mild hyperkalemia-D/C KCL from IVF   LOS: 33 days    Lael Wetherbee T 05/03/2011

## 2011-05-04 LAB — BASIC METABOLIC PANEL
BUN: 19 mg/dL (ref 6–23)
Chloride: 112 mEq/L (ref 96–112)
GFR calc Af Amer: 57 mL/min — ABNORMAL LOW (ref >90)
GFR calc non Af Amer: 49 mL/min — ABNORMAL LOW (ref >90)
Glucose, Bld: 86 mg/dL (ref 70–99)
Potassium: 4.5 mEq/L (ref 3.5–5.1)
Sodium: 140 mEq/L (ref 135–145)

## 2011-05-04 NOTE — Consult Note (Signed)
WOC consult Note  Wound type: right lower quadrant ostomy takedown site Measurement:2cm x 6cm x 4cm Wound bed: pink, moist Drainage (amount, consistency, odor)  serosanquinous Periwound: intact Dressing procedure/placement/frequency: daily saline moistened gauze, tope with dry 4x4, secure with tape Patient tolerated procedure well with minimal incisional discomfort. Patient is hungry and hoping for advancement of diet at first opportunity. No further WOC Needs at this time.  Please reconsult if needed. Thanks, Ladona Mow, MSN, RN, GNP, CWOCN [phone])

## 2011-05-04 NOTE — Progress Notes (Signed)
05/04/2011 [name],RN BSN CCM [phone] PT HAD ILEOSTOMY TAKEDOWN 05/02/11/CURRENTLY PROGRESSING DIET/STARTED FULL LIQUIDS AT LUNCH TODAY/PT WITH POSTIVE BS, NO BM OR FLATUS CM SPOKE WTH PATIENT AND SPOUSE. PLANS ARE THAT PT WILL RETURN TO HER HOME IN [place] WHERE SPOUSE WILL BE CAREGIVER. NO DME NEEDS. PT IS AMBULATING FROM BED TO BR. IF HH NEEDED FOR DSG CHANGES PT WANTS TO USE [facility]. HAS USED [facility]ENC[facility] IN THE PAST. CM WILL FOLLOW FOR HH NEEDS

## 2011-05-04 NOTE — Progress Notes (Signed)
Patient ID: [name], female   DOB: 1951/05/20, 60 y.o.   MRN: [id number] 2 Days Post-Op  Subjective: No C/O x incision sore.  Tol CL.  No BM or flatus  Objective: Vital signs in last 24 hours: Temp:  [97.7 F (36.5 C)-98 F (36.7 C)] 98 F (36.7 C) (12/06 0530) Pulse Rate:  [81-96] 81  (12/06 0530) Resp:  [16-18] 18  (12/06 0530) BP: (90-104)/(51-69) 90/51 mmHg (12/06 0530) SpO2:  [96 %-99 %] 96 % (12/06 0530) Last BM Date: 05/02/11  Intake/Output from previous day: 12/05 0701 - 12/06 0700 In: 2684.8 [P.O.:600; I.V.:2084.8] Out: 2450 [Urine:2450] Intake/Output this shift:    General appearance: alert and no distress GI: normal findings: bowel sounds normal and soft, non-tender  Lab Results:   Basename 05/03/11 0400 05/01/11 0745  WBC 9.5 8.0  HGB 9.2* 10.8*  HCT 28.0* 32.8*  PLT 316 317   BMET  Basename 05/04/11 0357 05/03/11 0400  NA 140 134*  K 4.5 5.5*  CL 112 106  CO2 22 19  GLUCOSE 86 113*  BUN 19 25*  CREATININE 1.19* 1.20*  CALCIUM 9.5 9.9   PT/INR No results found for this basename: LABPROT:2,INR:2 in the last 72 hours ABG No results found for this basename: PHART:2,PCO2:2,PO2:2,HCO3:2 in the last 72 hours  Studies/Results: No results found.  Anti-infectives: Anti-infectives     Start     Dose/Rate Route Frequency Ordered Stop   05/02/11 0600   ciprofloxacin (CIPRO) IVPB 400 mg        400 mg 200 mL/hr over 60 Minutes Intravenous 120 min pre-op 05/01/11 1930 05/02/11 1339   04/28/11 1400   ciprofloxacin (CIPRO) tablet 500 mg        500 mg Oral 2 times daily 04/28/11 1207     04/23/11 1030   sulfamethoxazole-trimethoprim (BACTRIM DS) 800-160 MG per tablet 1 tablet  Status:  Discontinued        1 tablet Oral Every 12 hours 04/23/11 0953 04/27/11 1721   04/02/11 1000   amoxicillin-clavulanate (AUGMENTIN) 875-125 MG per tablet 1 tablet  Status:  Discontinued        1 tablet Oral Every 12 hours 04/01/11 1036 04/10/11 0832   04/01/11  0000   fluconazole (DIFLUCAN) tablet 100 mg  Status:  Discontinued        100 mg Oral Daily 04/01/11 0005 04/10/11 0832          Assessment/Plan: s/p Procedure(s): ILEOSTOMY TAKEDOWN Doing well post op Start FL diet Begin wound packing   LOS: 34 days    [name] 05/04/2011

## 2011-05-05 NOTE — Progress Notes (Signed)
Discussed in the long length of stay meeting [name] Weeks 05/05/2011  

## 2011-05-05 NOTE — Progress Notes (Signed)
Patient ID: [name], female   DOB: 1950-12-12, 60 y.o.   MRN: [id number] 3 Days Post-Op  Subjective: No C/O.  +BM. Tol FL diet without difficulty  Objective: Vital signs in last 24 hours: Temp:  [97.9 F (36.6 C)-98.6 F (37 C)] 97.9 F (36.6 C) (12/07 0543) Pulse Rate:  [82-104] 83  (12/07 0543) Resp:  [18] 18  (12/07 0543) BP: (91-110)/(58-66) 110/66 mmHg (12/07 0543) SpO2:  [96 %-99 %] 97 % (12/07 0543) Last BM Date: 05/04/11  Intake/Output from previous day: 12/06 0701 - 12/07 0700 In: 1987.5 [P.O.:200; I.V.:1787.5] Out: 1800 [Urine:1800] Intake/Output this shift:    General appearance: alert and no distress GI: normal findings: soft, non-tender Incision/Wound:Packed  Lab Results:   Basename 05/03/11 0400  WBC 9.5  HGB 9.2*  HCT 28.0*  PLT 316   BMET  Basename 05/04/11 0357 05/03/11 0400  NA 140 134*  K 4.5 5.5*  CL 112 106  CO2 22 19  GLUCOSE 86 113*  BUN 19 25*  CREATININE 1.19* 1.20*  CALCIUM 9.5 9.9   PT/INR No results found for this basename: LABPROT:2,INR:2 in the last 72 hours ABG No results found for this basename: PHART:2,PCO2:2,PO2:2,HCO3:2 in the last 72 hours  Studies/Results: No results found.  Anti-infectives: Anti-infectives     Start     Dose/Rate Route Frequency Ordered Stop   05/02/11 0600   ciprofloxacin (CIPRO) IVPB 400 mg        400 mg 200 mL/hr over 60 Minutes Intravenous 120 min pre-op 05/01/11 1930 05/02/11 1339   04/28/11 1400   ciprofloxacin (CIPRO) tablet 500 mg        500 mg Oral 2 times daily 04/28/11 1207     04/23/11 1030   sulfamethoxazole-trimethoprim (BACTRIM DS) 800-160 MG per tablet 1 tablet  Status:  Discontinued        1 tablet Oral Every 12 hours 04/23/11 0953 04/27/11 1721   04/02/11 1000   amoxicillin-clavulanate (AUGMENTIN) 875-125 MG per tablet 1 tablet  Status:  Discontinued        1 tablet Oral Every 12 hours 04/01/11 1036 04/10/11 0832   04/01/11 0000   fluconazole (DIFLUCAN) tablet 100  mg  Status:  Discontinued        100 mg Oral Daily 04/01/11 0005 04/10/11 0832          Assessment/Plan: s/p Procedure(s): ILEOSTOMY TAKEDOWN Doing well post op Advance diet, continue wound care   LOS: 35 days    [name] T 05/05/2011

## 2011-05-06 NOTE — Plan of Care (Signed)
Problem: Phase III Progression Outcomes Goal: IV changed to normal saline lock Outcome: Completed/Met Date Met:  05/06/11 ivf's dc'd today 05/06/11-- iv expired therefore site dc'd-- pt refused to have restarted at this time

## 2011-05-06 NOTE — Progress Notes (Signed)
Patient ID: [name], female   DOB: [birth year], 60 y.o.   MRN: [id number] 4 Days Post-Op  Subjective: No C/O, tol reg diet.  Several loose BMs.  No significant pain  Objective: Vital signs in last 24 hours: Temp:  [97.8 F (36.6 C)-98.8 F (37.1 C)] 98.3 F (36.8 C) (12/08 0530) Pulse Rate:  [85-88] 86  (12/08 0530) Resp:  [18] 18  (12/08 0530) BP: (100-132)/(67-89) 110/67 mmHg (12/08 0530) SpO2:  [96 %-99 %] 96 % (12/08 0530) Last BM Date: 05/04/11  Intake/Output from previous day: 12/07 0701 - 12/08 0700 In: 1366.3 [P.O.:280; I.V.:1086.3] Out: 700 [Urine:700] Intake/Output this shift:    General appearance: alert and no distress GI: normal findings: soft, non-tender  Lab Results:  No results found for this basename: WBC:2,HGB:2,HCT:2,PLT:2 in the last 72 hours BMET  [facility] 05/04/11 0357  NA 140  K 4.5  CL 112  CO2 22  GLUCOSE 86  BUN 19  CREATININE 1.19*  CALCIUM 9.5   PT/INR No results found for this basename: LABPROT:2,INR:2 in the last 72 hours ABG No results found for this basename: PHART:2,PCO2:2,PO2:2,HCO3:2 in the last 72 hours  Studies/Results: No results found.  Anti-infectives: Anti-infectives     Start     Dose/Rate Route Frequency Ordered Stop   05/02/11 0600   ciprofloxacin (CIPRO) IVPB 400 mg        400 mg 200 mL/hr over 60 Minutes Intravenous 120 min pre-op 05/01/11 1930 05/02/11 1339   04/28/11 1400   ciprofloxacin (CIPRO) tablet 500 mg        500 mg Oral 2 times daily 04/28/11 1207     04/23/11 1030   sulfamethoxazole-trimethoprim (BACTRIM DS) 800-160 MG per tablet 1 tablet  Status:  Discontinued        1 tablet Oral Every 12 hours 04/23/11 0953 04/27/11 1721   04/02/11 1000   amoxicillin-clavulanate (AUGMENTIN) 875-125 MG per tablet 1 tablet  Status:  Discontinued        1 tablet Oral Every 12 hours 04/01/11 1036 04/10/11 0832   04/01/11 0000   fluconazole (DIFLUCAN) tablet 100 mg  Status:  Discontinued        100 mg Oral  Daily 04/01/11 0005 04/10/11 0832          Assessment/Plan: s/p Procedure(s): ILEOSTOMY TAKEDOWN Doing well.  Will D/C IVF. Recheck U/A to F/U  UTI   LOS: 36 days    Terrel Nesheiwat T 05/06/2011

## 2011-05-07 LAB — URINE MICROSCOPIC-ADD ON

## 2011-05-07 LAB — URINALYSIS, ROUTINE W REFLEX MICROSCOPIC
Bilirubin Urine: NEGATIVE
Glucose, UA: NEGATIVE mg/dL
Ketones, ur: NEGATIVE mg/dL
Nitrite: NEGATIVE
Protein, ur: NEGATIVE mg/dL
pH: 5 (ref 5.0–8.0)

## 2011-05-07 LAB — BASIC METABOLIC PANEL
BUN: 8 mg/dL (ref 6–23)
CO2: 23 mEq/L (ref 19–32)
Calcium: 9.5 mg/dL (ref 8.4–10.5)
Chloride: 108 mEq/L (ref 96–112)
Creatinine, Ser: 0.99 mg/dL (ref 0.50–1.10)

## 2011-05-07 NOTE — Progress Notes (Signed)
Patient ID: [name], female   DOB: 31-Mar-1951, 60 y.o.   MRN: [id number] 5 Days Post-Op  Subjective: No C/O.  Loose stools as expected but manageable  Objective: Vital signs in last 24 hours: Temp:  [97.9 F (36.6 C)-98.7 F (37.1 C)] 97.9 F (36.6 C) (12/09 0520) Pulse Rate:  [84-91] 89  (12/09 0520) Resp:  [18] 18  (12/09 0520) BP: (103-122)/(58-78) 103/58 mmHg (12/09 0520) SpO2:  [95 %-97 %] 96 % (12/09 0520) Last BM Date: 05/06/11  Intake/Output from previous day: 12/08 0701 - 12/09 0700 In: 480 [P.O.:480] Out: 2675 [Urine:1325; Stool:1350] Intake/Output this shift:    General appearance: alert and no distress GI: normal findings: soft, non-tender Incision/Wound:Wound repacked, very clean  Lab Results:  No results found for this basename: WBC:2,HGB:2,HCT:2,PLT:2 in the last 72 hours BMET  [facility] 05/07/11 0510  NA 139  K 3.3*  CL 108  CO2 23  GLUCOSE 88  BUN 8  CREATININE 0.99  CALCIUM 9.5   PT/INR No results found for this basename: LABPROT:2,INR:2 in the last 72 hours ABG No results found for this basename: PHART:2,PCO2:2,PO2:2,HCO3:2 in the last 72 hours  Studies/Results: No results found.  Anti-infectives: Anti-infectives     Start     Dose/Rate Route Frequency Ordered Stop   05/02/11 0600   ciprofloxacin (CIPRO) IVPB 400 mg        400 mg 200 mL/hr over 60 Minutes Intravenous 120 min pre-op 05/01/11 1930 05/02/11 1339   04/28/11 1400   ciprofloxacin (CIPRO) tablet 500 mg        500 mg Oral 2 times daily 04/28/11 1207     04/23/11 1030   sulfamethoxazole-trimethoprim (BACTRIM DS) 800-160 MG per tablet 1 tablet  Status:  Discontinued        1 tablet Oral Every 12 hours 04/23/11 0953 04/27/11 1721   04/02/11 1000   amoxicillin-clavulanate (AUGMENTIN) 875-125 MG per tablet 1 tablet  Status:  Discontinued        1 tablet Oral Every 12 hours 04/01/11 1036 04/10/11 0832   04/01/11 0000   fluconazole (DIFLUCAN) tablet 100 mg  Status:   Discontinued        100 mg Oral Daily 04/01/11 0005 04/10/11 0832          Assessment/Plan: s/p Procedure(s): ILEOSTOMY TAKEDOWN Doing well Plan discharge tomorrow Recheck U/A pending   LOS: 37 days    Willard Madrigal T 05/07/2011

## 2011-05-08 MED ORDER — CIPROFLOXACIN HCL 500 MG PO TABS: 500.0000 mg | ORAL_TABLET | Freq: Two times a day (BID) | ORAL | Status: AC

## 2011-05-08 MED ORDER — HYDROCODONE-ACETAMINOPHEN 5-325 MG PO TABS: ORAL_TABLET | ORAL | Status: DC

## 2011-05-08 NOTE — Progress Notes (Signed)
05/08/2011 Raynelle Bring BSN CCM [phone] PT for d/c today. There are no home health orders or needs at this time. Per Primary Nurse pt's spouse who is caregiver was instructed on dsg changes this weekend and is able to change dsg.

## 2011-05-08 NOTE — Discharge Summary (Signed)
Patient ID: [name] [id number] 60 y.o. December 20, 1950  03/31/2011  Discharge date and time: 05/08/2011.  Admitting Physician: Mariella Saa  Discharge Physician: Glenna Fellows T  Admission Diagnoses: llestomy pt ILEOSTOMY COMPLICATION WITH INFECTION  Discharge Diagnoses: Ileostomy dysfunction  Operations: Procedure(s): ILEOSTOMY TAKEDOWN  Admission Condition: fair  Discharged Condition: good    Hospital Course: the patient is a 60 year old female who is status post laparoscopic total abdominal colectomy and ileoproctostomy for severe pan diverticulosis and diverticulitis. At almost one month postoperatively she developed an anastomotic leak and was admitted and underwent diverting loop ileostomy and abdominal washout by Dr.Rosenbower. She was discharged home following this but had immediate difficulty keeping her ostomy device sealed having frequent leakage. She developed skin breakdown and was admitted on November 2 for ostomy care. Despite intensive work by the stomal therapist and nursing staff we could really not find a bag that could keep her ostomy sealed due to a skin crease adjacent to the ostomy. With hospital care and frequent bag changes or skin condition improved but we were never able to get to the point where she and her husband would be able to take care of this at home. The patient therefore remained hospitalized until I could take down her ileostomy. She underwent a preoperative barium study of her anastomosis that showed no leak or stricture. She was then taken to the operating roomon 05/02/2011 and underwent an uneventful takedown of her ileostomy. Her postoperative course was smooth. She began having bowel function on about the third day and her diet was advanced without difficulty. Her stomal wound was left open and was being packed daily with moist saline gauze. She is felt ready for discharge. She is afebrile and her wound is clean. She remains on  oral Cipro to 2 an abnormal urinalysis with white cells and bacteria which was persistent on recent urinalysis. Her cultures are negative. She will see her urologist soon after discharge for followup. She remains on Cipro for this. Follow up with my office in 3 weeks.  Consults: none  Significant Diagnostic Studies: BE pre op without leak or stricture  Treatments: surgery: Ileostomy takedown 05/02/2011  Disposition: Home  Patient Instructions:   [name], [name]  Home Medication Instructions [id number]   Printed on:05/08/11 0750  Medication Information                    meloxicam (MOBIC) 15 MG tablet Take 15 mg by mouth at bedtime.            SYNTHROID 137 MCG tablet Take 137 mcg by mouth daily.           albuterol (PROVENTIL HFA) 108 (90 BASE) MCG/ACT inhaler Inhale 2 puffs into the lungs every 6 (six) hours as needed.           zolpidem (AMBIEN) 10 MG tablet take 1 tablet by mouth at bedtime if needed           losartan (COZAAR) 50 MG tablet TAKE 1 TABLET BY MOUTH ONCE DAILY           amoxicillin-clavulanate (AUGMENTIN) 875-125 MG per tablet Take 1 tablet by mouth 2 (two) times daily. Started on 03-27-11 for 7 days.            acetaminophen (TYLENOL) 500 MG tablet Take 1,000 mg by mouth every 6 (six) hours as needed. FOR PAIN            montelukast (SINGULAIR) 10 MG tablet  sertraline (ZOLOFT) 100 MG tablet             ciprofloxacin (CIPRO) 500 MG tablet Take 1 tablet (500 mg total) by mouth 2 (two) times daily.           HYDROcodone-acetaminophen (NORCO) 5-325 MG per tablet 1-2 tabs every 4-6 hours prn pain             Activity: no heavy lifting for 4 weeks Diet: regular diet Wound Care: as directed  Follow-up:  With Dr Johna Sheriff in 3 weeks.  Signed: Mariella Saa MD, FACS  05/08/2011, 7:50 AM

## 2011-05-08 NOTE — Progress Notes (Signed)
Utilization review completed.  

## 2011-05-08 NOTE — Progress Notes (Signed)
Patient ID: [name], female   DOB: Jul 28, 1950, 60 y.o.   MRN: [id number] 6 Days Post-Op  Subjective: Patient without complaint.  Objective: Vital signs in last 24 hours: Temp:  [98.3 F (36.8 C)-99.5 F (37.5 C)] 99.5 F (37.5 C) (12/10 0515) Pulse Rate:  [87-91] 91  (12/10 0515) Resp:  [16-18] 18  (12/10 0515) BP: (109-115)/(67-76) 113/76 mmHg (12/10 0515) SpO2:  [94 %-96 %] 94 % (12/10 0515) Last BM Date: 05/07/11  Intake/Output from previous day: 12/09 0701 - 12/10 0700 In: 480 [P.O.:480] Out: 2225 [Urine:1625; Stool:600] Intake/Output this shift:    General appearance: alert and no distress GI: normal findings: soft, non-tender Incision/Wound:clean and packed`  Lab Results:  No results found for this basename: WBC:2,HGB:2,HCT:2,PLT:2 in the last 72 hours BMET  [facility] 05/07/11 0510  NA 139  K 3.3*  CL 108  CO2 23  GLUCOSE 88  BUN 8  CREATININE 0.99  CALCIUM 9.5   PT/INR No results found for this basename: LABPROT:2,INR:2 in the last 72 hours ABG No results found for this basename: PHART:2,PCO2:2,PO2:2,HCO3:2 in the last 72 hours  Studies/Results: No results found.  Anti-infectives: Anti-infectives     Start     Dose/Rate Route Frequency Ordered Stop   05/02/11 0600   ciprofloxacin (CIPRO) IVPB 400 mg        400 mg 200 mL/hr over 60 Minutes Intravenous 120 min pre-op 05/01/11 1930 05/02/11 1339   04/28/11 1400   ciprofloxacin (CIPRO) tablet 500 mg        500 mg Oral 2 times daily 04/28/11 1207     04/23/11 1030   sulfamethoxazole-trimethoprim (BACTRIM DS) 800-160 MG per tablet 1 tablet  Status:  Discontinued        1 tablet Oral Every 12 hours 04/23/11 0953 04/27/11 1721   04/02/11 1000   amoxicillin-clavulanate (AUGMENTIN) 875-125 MG per tablet 1 tablet  Status:  Discontinued        1 tablet Oral Every 12 hours 04/01/11 1036 04/10/11 0832   04/01/11 0000   fluconazole (DIFLUCAN) tablet 100 mg  Status:  Discontinued        100 mg Oral  Daily 04/01/11 0005 04/10/11 0832          Assessment/Plan: s/p Procedure(s): ILEOSTOMY TAKEDOWN Doing well post ileostomy takedown without complication and ready for discharge. Urinalysis still shows white cells and bacteria. Will continue Cipro for one week and she will followup with Dr. Wilson Singer.   LOS: 38 days    [name] 05/08/2011

## 2011-05-08 NOTE — Progress Notes (Signed)
Patient is alert and oriented, vital signs are stable, pt with some complaints of pain or discomfort or to abdomen medicated with 2 tabs of oxycodone this am, pt with no nausea or vomiting and tolerating current diet, bowel sounds to abdomen are active, dressings are clean dry and intact, pt up and ambulating in room, pt to be discharged today awaiting husband return to review discharge instructions, prescriptions given to patient [employer] RN

## 2011-05-10 ENCOUNTER — Telehealth (INDEPENDENT_AMBULATORY_CARE_PROVIDER_SITE_OTHER): Payer: Self-pay | Admitting: General Surgery

## 2011-05-10 NOTE — Telephone Encounter (Signed)
Patient's husband called, patient was just released from the hospital on Monday and husband was to do dressing changes. Husband now has flu and running 102 fever. He was told he is very contagious and he does not want to be around wife since she was just in the hospital for 60 days. Can we order a home health nurse to do dressing changes until husband gets better? Please advise and contact [name], her husband, back at [phone] or patient at [phone].

## 2011-05-10 NOTE — Telephone Encounter (Signed)
Called [facility] to schedule wound care for patient, left a message to call Neysa Bonito to discuss this further

## 2011-05-14 ENCOUNTER — Emergency Department (HOSPITAL_COMMUNITY): Payer: Managed Care, Other (non HMO)

## 2011-05-14 ENCOUNTER — Encounter (HOSPITAL_COMMUNITY)
Admission: EM | Disposition: A | Discharge status: Home Health | Payer: Self-pay | Source: Home / Self Care | Attending: General Surgery

## 2011-05-14 ENCOUNTER — Other Ambulatory Visit (INDEPENDENT_AMBULATORY_CARE_PROVIDER_SITE_OTHER): Payer: Self-pay | Admitting: Surgery

## 2011-05-14 ENCOUNTER — Encounter (HOSPITAL_COMMUNITY): Payer: Self-pay | Admitting: Anesthesiology

## 2011-05-14 ENCOUNTER — Encounter (HOSPITAL_COMMUNITY): Payer: Self-pay | Admitting: *Deleted

## 2011-05-14 ENCOUNTER — Inpatient Hospital Stay (HOSPITAL_COMMUNITY)
Admission: EM | Admit: 2011-05-14 | Discharge: 2011-05-25 | DRG: 329 | Disposition: A | Discharge status: Home Health | Payer: Managed Care, Other (non HMO) | Attending: General Surgery | Admitting: General Surgery

## 2011-05-14 ENCOUNTER — Emergency Department (HOSPITAL_COMMUNITY): Payer: Managed Care, Other (non HMO) | Admitting: Anesthesiology

## 2011-05-14 ENCOUNTER — Inpatient Hospital Stay (HOSPITAL_COMMUNITY): Payer: Managed Care, Other (non HMO)

## 2011-05-14 DIAGNOSIS — N200 Calculus of kidney: Secondary | ICD-10-CM | POA: Diagnosis present

## 2011-05-14 DIAGNOSIS — R100 Acute abdomen: Secondary | ICD-10-CM

## 2011-05-14 DIAGNOSIS — K631 Perforation of intestine (nontraumatic): Secondary | ICD-10-CM | POA: Diagnosis present

## 2011-05-14 DIAGNOSIS — K929 Disease of digestive system, unspecified: Principal | ICD-10-CM | POA: Diagnosis present

## 2011-05-14 DIAGNOSIS — R6521 Severe sepsis with septic shock: Secondary | ICD-10-CM

## 2011-05-14 DIAGNOSIS — I10 Essential (primary) hypertension: Secondary | ICD-10-CM | POA: Diagnosis present

## 2011-05-14 DIAGNOSIS — R6520 Severe sepsis without septic shock: Secondary | ICD-10-CM | POA: Diagnosis present

## 2011-05-14 DIAGNOSIS — E785 Hyperlipidemia, unspecified: Secondary | ICD-10-CM | POA: Diagnosis present

## 2011-05-14 DIAGNOSIS — A419 Sepsis, unspecified organism: Secondary | ICD-10-CM

## 2011-05-14 DIAGNOSIS — K659 Peritonitis, unspecified: Secondary | ICD-10-CM

## 2011-05-14 DIAGNOSIS — J9600 Acute respiratory failure, unspecified whether with hypoxia or hypercapnia: Secondary | ICD-10-CM

## 2011-05-14 DIAGNOSIS — Z9049 Acquired absence of other specified parts of digestive tract: Secondary | ICD-10-CM

## 2011-05-14 DIAGNOSIS — R062 Wheezing: Secondary | ICD-10-CM

## 2011-05-14 DIAGNOSIS — K55069 Acute infarction of intestine, part and extent unspecified: Secondary | ICD-10-CM | POA: Diagnosis present

## 2011-05-14 DIAGNOSIS — Z8719 Personal history of other diseases of the digestive system: Secondary | ICD-10-CM

## 2011-05-14 DIAGNOSIS — K55059 Acute (reversible) ischemia of intestine, part and extent unspecified: Secondary | ICD-10-CM | POA: Diagnosis present

## 2011-05-14 DIAGNOSIS — E039 Hypothyroidism, unspecified: Secondary | ICD-10-CM | POA: Diagnosis present

## 2011-05-14 DIAGNOSIS — R109 Unspecified abdominal pain: Secondary | ICD-10-CM | POA: Diagnosis present

## 2011-05-14 DIAGNOSIS — Y832 Surgical operation with anastomosis, bypass or graft as the cause of abnormal reaction of the patient, or of later complication, without mention of misadventure at the time of the procedure: Secondary | ICD-10-CM | POA: Diagnosis present

## 2011-05-14 DIAGNOSIS — J95821 Acute postprocedural respiratory failure: Secondary | ICD-10-CM | POA: Diagnosis not present

## 2011-05-14 DIAGNOSIS — D649 Anemia, unspecified: Secondary | ICD-10-CM

## 2011-05-14 DIAGNOSIS — E876 Hypokalemia: Secondary | ICD-10-CM | POA: Diagnosis not present

## 2011-05-14 DIAGNOSIS — K658 Other peritonitis: Secondary | ICD-10-CM | POA: Diagnosis present

## 2011-05-14 HISTORY — DX: Perforation of intestine (nontraumatic): K63.1

## 2011-05-14 HISTORY — PX: CREATION / REVISION OF ILEOSTOMY / JEJUNOSTOMY: SUR354

## 2011-05-14 LAB — CBC
HCT: 25.3 % — ABNORMAL LOW (ref 36.0–46.0)
Hemoglobin: 8.3 g/dL — ABNORMAL LOW (ref 12.0–15.0)
MCH: 29.6 pg (ref 26.0–34.0)
MCHC: 34.7 g/dL (ref 30.0–36.0)
MCV: 87.8 fL (ref 78.0–100.0)
MCV: 85.4 fL (ref 78.0–100.0)
Platelets: 261 10*3/uL (ref 150–400)
RBC: 2.88 MIL/uL — ABNORMAL LOW (ref 3.87–5.11)
RBC: 3.71 MIL/uL — ABNORMAL LOW (ref 3.87–5.11)
RDW: 15.5 % (ref 11.5–15.5)
WBC: 3.4 10*3/uL — ABNORMAL LOW (ref 4.0–10.5)

## 2011-05-14 LAB — BASIC METABOLIC PANEL
BUN: 15 mg/dL (ref 6–23)
Chloride: 103 mEq/L (ref 96–112)
GFR calc Af Amer: 44 mL/min — ABNORMAL LOW (ref >90)
GFR calc non Af Amer: 38 mL/min — ABNORMAL LOW (ref >90)
Glucose, Bld: 103 mg/dL — ABNORMAL HIGH (ref 70–99)
Potassium: 2.8 mEq/L — ABNORMAL LOW (ref 3.5–5.1)
Sodium: 136 mEq/L (ref 135–145)

## 2011-05-14 LAB — DIFFERENTIAL
Basophils Absolute: 0 10*3/uL (ref 0.0–0.1)
Basophils Absolute: 0 10*3/uL (ref 0.0–0.1)
Eosinophils Absolute: 0 10*3/uL (ref 0.0–0.7)
Eosinophils Relative: 0 % (ref 0–5)
Lymphocytes Relative: 21 % (ref 12–46)
Lymphocytes Relative: 7 % — ABNORMAL LOW (ref 12–46)
Lymphs Abs: 0.7 10*3/uL (ref 0.7–4.0)
Lymphs Abs: 0.8 10*3/uL (ref 0.7–4.0)
Monocytes Relative: 2 % — ABNORMAL LOW (ref 3–12)
Neutro Abs: 2.6 10*3/uL (ref 1.7–7.7)
Neutro Abs: 10.6 10*3/uL — ABNORMAL HIGH (ref 1.7–7.7)

## 2011-05-14 LAB — URINALYSIS, ROUTINE W REFLEX MICROSCOPIC
Nitrite: NEGATIVE
Specific Gravity, Urine: 1.022 (ref 1.005–1.030)
Urobilinogen, UA: 0.2 mg/dL (ref 0.0–1.0)
pH: 5.5 (ref 5.0–8.0)

## 2011-05-14 LAB — URINE MICROSCOPIC-ADD ON

## 2011-05-14 LAB — BLOOD GAS, ARTERIAL
Acid-base deficit: 7.1 mmol/L — ABNORMAL HIGH (ref 0.0–2.0)
FIO2: 0.5 %
MECHVT: 530 mL
O2 Saturation: 97.1 %
Patient temperature: 98.6
RATE: 10 resp/min
pO2, Arterial: 82.2 mmHg (ref 80.0–100.0)

## 2011-05-14 LAB — COMPREHENSIVE METABOLIC PANEL
AST: 33 U/L (ref 0–37)
CO2: 20 mEq/L (ref 19–32)
Calcium: 7.2 mg/dL — ABNORMAL LOW (ref 8.4–10.5)
Creatinine, Ser: 0.95 mg/dL (ref 0.50–1.10)
GFR calc Af Amer: 75 mL/min — ABNORMAL LOW (ref >90)
GFR calc non Af Amer: 65 mL/min — ABNORMAL LOW (ref >90)

## 2011-05-14 LAB — LACTIC ACID, PLASMA: Lactic Acid, Venous: 2.5 mmol/L — ABNORMAL HIGH (ref 0.5–2.2)

## 2011-05-14 LAB — PHOSPHORUS: Phosphorus: 3.2 mg/dL (ref 2.3–4.6)

## 2011-05-14 LAB — PROTIME-INR: INR: 1.34 (ref 0.00–1.49)

## 2011-05-14 SURGERY — LAPAROTOMY, EXPLORATORY
Anesthesia: General | Site: Abdomen | Wound class: Dirty or Infected

## 2011-05-14 MED ORDER — SODIUM CHLORIDE 0.9 % IV SOLN: INTRAVENOUS | Status: DC

## 2011-05-14 MED ORDER — SUCCINYLCHOLINE CHLORIDE 20 MG/ML IJ SOLN
INTRAMUSCULAR | Status: DC | PRN
  Administered 2011-05-14: 100 mg via INTRAVENOUS

## 2011-05-14 MED ORDER — ROCURONIUM BROMIDE 100 MG/10ML IV SOLN
INTRAVENOUS | Status: DC | PRN
  Administered 2011-05-14: 20 mg via INTRAVENOUS
  Administered 2011-05-14: 50 mg via INTRAVENOUS

## 2011-05-14 MED ORDER — SODIUM CHLORIDE 0.9 % IV SOLN
3.0000 g | Freq: Once | INTRAVENOUS | Status: AC
  Administered 2011-05-14: 3 g via INTRAVENOUS
  Filled 2011-05-14: qty 3

## 2011-05-14 MED ORDER — POTASSIUM CHLORIDE IN NACL 40-0.9 MEQ/L-% IV SOLN
INTRAVENOUS | Status: DC
  Administered 2011-05-14 – 2011-05-16 (×4): via INTRAVENOUS
  Administered 2011-05-17: 75 mL/h via INTRAVENOUS
  Administered 2011-05-18 – 2011-05-19 (×3): via INTRAVENOUS
  Filled 2011-05-14 (×18): qty 1000

## 2011-05-14 MED ORDER — POTASSIUM CHLORIDE 10 MEQ/100ML IV SOLN
10.0000 meq | INTRAVENOUS | Status: DC
  Filled 2011-05-14 (×3): qty 100

## 2011-05-14 MED ORDER — LEVOTHYROXINE SODIUM 100 MCG IV SOLR
50.0000 ug | Freq: Every day | INTRAVENOUS | Status: DC
  Administered 2011-05-14 – 2011-05-17 (×4): 50 ug via INTRAVENOUS
  Filled 2011-05-14 (×5): qty 2.5

## 2011-05-14 MED ORDER — HYDROMORPHONE HCL PF 1 MG/ML IJ SOLN
0.5000 mg | Freq: Once | INTRAMUSCULAR | Status: AC
  Administered 2011-05-14: 0.5 mg via INTRAVENOUS
  Filled 2011-05-14: qty 1

## 2011-05-14 MED ORDER — DOPAMINE-DEXTROSE 3.2-5 MG/ML-% IV SOLN: 2.0000 ug/kg/min | INTRAVENOUS | Status: DC

## 2011-05-14 MED ORDER — SODIUM CHLORIDE 0.9 % IR SOLN
Status: DC | PRN
  Administered 2011-05-14: 3000 mL

## 2011-05-14 MED ORDER — FENTANYL CITRATE 0.05 MG/ML IJ SOLN
50.0000 ug | INTRAMUSCULAR | Status: DC | PRN
  Administered 2011-05-14 (×2): 100 ug via INTRAVENOUS
  Filled 2011-05-14 (×3): qty 2

## 2011-05-14 MED ORDER — MIDAZOLAM BOLUS VIA INFUSION
1.0000 mg | INTRAVENOUS | Status: DC | PRN
  Filled 2011-05-14: qty 2

## 2011-05-14 MED ORDER — MIDAZOLAM HCL 5 MG/5ML IJ SOLN
INTRAMUSCULAR | Status: DC | PRN
  Administered 2011-05-14 (×2): 1 mg via INTRAVENOUS
  Administered 2011-05-14: 2 mg via INTRAVENOUS

## 2011-05-14 MED ORDER — MIDAZOLAM HCL 2 MG/2ML IJ SOLN: 2.0000 mg | INTRAMUSCULAR | Status: DC | PRN

## 2011-05-14 MED ORDER — FENTANYL BOLUS VIA INFUSION
50.0000 ug | Freq: Four times a day (QID) | INTRAVENOUS | Status: DC | PRN
  Filled 2011-05-14: qty 100

## 2011-05-14 MED ORDER — EPHEDRINE SULFATE 50 MG/ML IJ SOLN
INTRAMUSCULAR | Status: DC | PRN
  Administered 2011-05-14: 10 mg via INTRAVENOUS
  Administered 2011-05-14: 20 mg via INTRAVENOUS
  Administered 2011-05-14: 10 mg via INTRAVENOUS

## 2011-05-14 MED ORDER — DEXTROSE 5 % IV SOLN
10.0000 mg | INTRAVENOUS | Status: DC | PRN
  Administered 2011-05-14: 100 ug/min via INTRAVENOUS

## 2011-05-14 MED ORDER — SODIUM CHLORIDE 0.9 % IV SOLN: 250.0000 mL | INTRAVENOUS | Status: DC | PRN

## 2011-05-14 MED ORDER — FENTANYL CITRATE 0.05 MG/ML IJ SOLN
INTRAMUSCULAR | Status: DC | PRN
  Administered 2011-05-14 (×2): 50 ug via INTRAVENOUS

## 2011-05-14 MED ORDER — HEPARIN SODIUM (PORCINE) 5000 UNIT/ML IJ SOLN
5000.0000 [IU] | Freq: Three times a day (TID) | INTRAMUSCULAR | Status: DC
  Administered 2011-05-14 – 2011-05-15 (×3): 5000 [IU] via SUBCUTANEOUS
  Filled 2011-05-14 (×6): qty 1

## 2011-05-14 MED ORDER — LACTATED RINGERS IV SOLN
INTRAVENOUS | Status: DC | PRN
  Administered 2011-05-14 (×3): via INTRAVENOUS

## 2011-05-14 MED ORDER — PANTOPRAZOLE SODIUM 40 MG IV SOLR
40.0000 mg | INTRAVENOUS | Status: DC
  Filled 2011-05-14: qty 40

## 2011-05-14 MED ORDER — SODIUM CHLORIDE 0.9 % IV SOLN
50.0000 ug/h | INTRAVENOUS | Status: DC
  Administered 2011-05-14: 50 ug/h via INTRAVENOUS
  Filled 2011-05-14 (×2): qty 50

## 2011-05-14 MED ORDER — LACTATED RINGERS IV SOLN
INTRAVENOUS | Status: DC | PRN
  Administered 2011-05-14: 10:00:00 via INTRAVENOUS

## 2011-05-14 MED ORDER — ASPIRIN 81 MG PO CHEW
324.0000 mg | CHEWABLE_TABLET | ORAL | Status: AC
  Filled 2011-05-14: qty 4

## 2011-05-14 MED ORDER — LACTATED RINGERS IV SOLN
INTRAVENOUS | Status: DC | PRN
  Administered 2011-05-14: 08:00:00 via INTRAVENOUS

## 2011-05-14 MED ORDER — PANTOPRAZOLE SODIUM 40 MG IV SOLR
40.0000 mg | Freq: Every day | INTRAVENOUS | Status: DC
  Administered 2011-05-14 – 2011-05-19 (×6): 40 mg via INTRAVENOUS
  Filled 2011-05-14 (×8): qty 40

## 2011-05-14 MED ORDER — ETOMIDATE 2 MG/ML IV SOLN
INTRAVENOUS | Status: DC | PRN
  Administered 2011-05-14: 10 mg via INTRAVENOUS

## 2011-05-14 MED ORDER — SODIUM CHLORIDE 0.9 % IV BOLUS (SEPSIS)
1000.0000 mL | Freq: Once | INTRAVENOUS | Status: AC
  Administered 2011-05-14: 1000 mL via INTRAVENOUS

## 2011-05-14 MED ORDER — SODIUM CHLORIDE 0.9 % IV SOLN
2.0000 mg/h | INTRAVENOUS | Status: DC
  Filled 2011-05-14: qty 10

## 2011-05-14 MED ORDER — ASPIRIN 300 MG RE SUPP
300.0000 mg | RECTAL | Status: AC
  Administered 2011-05-14: 300 mg via RECTAL
  Filled 2011-05-14: qty 1

## 2011-05-14 MED ORDER — SODIUM CHLORIDE 0.9 % IV SOLN
3.0000 g | Freq: Four times a day (QID) | INTRAVENOUS | Status: DC
  Filled 2011-05-14 (×4): qty 3

## 2011-05-14 MED ORDER — KETAMINE HCL 10 MG/ML IJ SOLN
INTRAMUSCULAR | Status: DC | PRN
  Administered 2011-05-14 (×2): 50 mg via INTRAVENOUS

## 2011-05-14 MED ORDER — POTASSIUM CHLORIDE 10 MEQ/100ML IV SOLN
INTRAVENOUS | Status: DC | PRN
  Administered 2011-05-14 (×3): 10 meq via INTRAVENOUS

## 2011-05-14 MED ORDER — IOHEXOL 300 MG/ML  SOLN
100.0000 mL | Freq: Once | INTRAMUSCULAR | Status: AC | PRN
  Administered 2011-05-14: 100 mL via INTRAVENOUS

## 2011-05-14 MED ORDER — DOPAMINE-DEXTROSE 3.2-5 MG/ML-% IV SOLN
INTRAVENOUS | Status: AC
  Filled 2011-05-14: qty 250

## 2011-05-14 MED ORDER — SODIUM CHLORIDE 0.9 % IV SOLN
1.0000 g | INTRAVENOUS | Status: DC
  Administered 2011-05-14 – 2011-05-21 (×8): 1 g via INTRAVENOUS
  Filled 2011-05-14 (×10): qty 1

## 2011-05-14 MED ORDER — SODIUM CHLORIDE 0.9 % IV SOLN
INTRAVENOUS | Status: DC | PRN
  Administered 2011-05-14 (×2): via INTRAVENOUS

## 2011-05-14 SURGICAL SUPPLY — 51 items
APL SKNCLS STERI-STRIP NONHPOA (GAUZE/BANDAGES/DRESSINGS) ×1
APPLICATOR COTTON TIP 6IN STRL (MISCELLANEOUS) ×1 IMPLANT
BENZOIN TINCTURE PRP APPL 2/3 (GAUZE/BANDAGES/DRESSINGS) ×1 IMPLANT
BLADE EXTENDED COATED 6.5IN (ELECTRODE) IMPLANT
BLADE HEX COATED 2.75 (ELECTRODE) ×2 IMPLANT
CANISTER SUCTION 2500CC (MISCELLANEOUS) ×2 IMPLANT
CATH ROBINSON RED A/P 14FR (CATHETERS) ×1 IMPLANT
CLOTH BEACON ORANGE TIMEOUT ST (SAFETY) ×2 IMPLANT
COVER MAYO STAND STRL (DRAPES) ×1 IMPLANT
DRAPE LAPAROSCOPIC ABDOMINAL (DRAPES) ×2 IMPLANT
DRAPE LG THREE QUARTER DISP (DRAPES) ×1 IMPLANT
DRAPE WARM FLUID 44X44 (DRAPE) ×1 IMPLANT
DRSG PAD ABDOMINAL 8X10 ST (GAUZE/BANDAGES/DRESSINGS) ×1 IMPLANT
ELECT BLADE 6.5 EXT (BLADE) ×1 IMPLANT
ELECT REM PT RETURN 9FT ADLT (ELECTROSURGICAL) ×2
ELECTRODE REM PT RTRN 9FT ADLT (ELECTROSURGICAL) ×1 IMPLANT
GAUZE SPONGE 4X4 12PLY STRL LF (GAUZE/BANDAGES/DRESSINGS) ×1 IMPLANT
GLOVE BIOGEL M 8.0 STRL (GLOVE) ×2 IMPLANT
GLOVE BIOGEL PI IND STRL 7.0 (GLOVE) ×1 IMPLANT
GLOVE BIOGEL PI INDICATOR 7.0 (GLOVE) ×1
GOWN STRL NON-REIN LRG LVL3 (GOWN DISPOSABLE) ×2 IMPLANT
GOWN STRL REIN XL XLG (GOWN DISPOSABLE) ×4 IMPLANT
KIT BASIN OR (CUSTOM PROCEDURE TRAY) ×2 IMPLANT
NS IRRIG 1000ML POUR BTL (IV SOLUTION) ×5 IMPLANT
PACK GENERAL/GYN (CUSTOM PROCEDURE TRAY) ×2 IMPLANT
RELOAD PROXIMATE 75MM BLUE (ENDOMECHANICALS) ×2 IMPLANT
RELOAD STAPLE 75 3.8 BLU REG (ENDOMECHANICALS) IMPLANT
SPONGE GAUZE 4X4 12PLY (GAUZE/BANDAGES/DRESSINGS) ×1 IMPLANT
SPONGE LAP 18X18 X RAY DECT (DISPOSABLE) ×1 IMPLANT
STAPLER PROXIMATE 75MM BLUE (STAPLE) ×1 IMPLANT
STAPLER VISISTAT 35W (STAPLE) ×2 IMPLANT
SUCTION POOLE TIP (SUCTIONS) ×1 IMPLANT
SUT CHROMIC 3 0 SH 27 (SUTURE) ×5 IMPLANT
SUT ETHILON 1 LR 30 (SUTURE) ×3 IMPLANT
SUT PDS AB 1 CTX 36 (SUTURE) IMPLANT
SUT PDS AB 1 TP1 96 (SUTURE) ×2 IMPLANT
SUT PROLENE 2 0 BLUE (SUTURE) ×1 IMPLANT
SUT RET BRIDGE (SUTURE) ×1 IMPLANT
SUT SILK 2 0 (SUTURE) ×2
SUT SILK 2 0 SH CR/8 (SUTURE) ×1 IMPLANT
SUT SILK 2-0 18XBRD TIE 12 (SUTURE) ×1 IMPLANT
SUT SILK 3 0 (SUTURE) ×2
SUT SILK 3 0 SH CR/8 (SUTURE) ×1 IMPLANT
SUT SILK 3-0 18XBRD TIE 12 (SUTURE) IMPLANT
SUT VICRYL 2 0 18  UND BR (SUTURE)
SUT VICRYL 2 0 18 UND BR (SUTURE) IMPLANT
TAPE HYPAFIX 4 X10 (GAUZE/BANDAGES/DRESSINGS) ×1 IMPLANT
TAPE HYPAFIX 6X30 (GAUZE/BANDAGES/DRESSINGS) ×1 IMPLANT
TOWEL OR 17X26 10 PK STRL BLUE (TOWEL DISPOSABLE) ×2 IMPLANT
TRAY FOLEY CATH 14FRSI W/METER (CATHETERS) ×1 IMPLANT
YANKAUER SUCT BULB TIP NO VENT (SUCTIONS) ×1 IMPLANT

## 2011-05-14 NOTE — ED Notes (Signed)
Miller, MD at bedside.  

## 2011-05-14 NOTE — Anesthesia Procedure Notes (Signed)
Performed by: Elsie Stain PAYNE    Procedure Name: Intubation Date/Time: 05/14/2011 7:41 AM Performed by: Randon Goldsmith CATHERINE PAYNE Pre-anesthesia Checklist: Patient identified, Emergency Drugs available, Suction available and Patient being monitored Patient Re-evaluated:Patient Re-evaluated prior to inductionOxygen Delivery Method: Circle System Utilized Intubation Type: IV induction, Circoid Pressure applied and Rapid sequence Laryngoscope Size: Mac and 3 Grade View: Grade II Tube type: Oral Tube size: 7.5 mm Number of attempts: 1 Airway Equipment and Method: stylet Placement Confirmation: ETT inserted through vocal cords under direct vision,  positive ETCO2,  CO2 detector and breath sounds checked- equal and bilateral Secured at: 21 cm Tube secured with: Tape Dental Injury: Teeth and Oropharynx as per pre-operative assessment

## 2011-05-14 NOTE — Op Note (Signed)
Surgeon: Wenda Low, MD, FACS  Asst:  Avel Peace, MD, FACS  Anes:  General by  Sherrian Divers, MD  Procedure: Exploratory laparotomy, resection of small bowel anastomosis, end ileostomy through previously marked ostomy site on the left side.  Diagnosis: Leaking small bowel anastomosis   Complications: none  EBL:   30 cc  Description of Procedure:  The patient was taken from the ER to the holding area on Sunday morning, May 14, 2011 after a CT scan demonstrated free air as a sign that the cause of her acute abdomen was likely a leak.  Informed consent was obtained from her and her husband regarding the possibility of recreating an ostomy. I reviewed the CT scan and discussed it with the radiologist indicated that there was more free air and was seen on the CT scan 5 days ago and also a new finding that there appeared to be clot in the superior mesenteric vein.  The patient was taken to the OR 1 and given general anesthesia. He was somewhat hypotensive. Preoperatively she received 3 g of Unasyn and her abdomen was prepped with PCMX and draped sterilely. I went through her previous midline incision using a knife and entered the peritoneal layer carefully and with scissor dissection was able to enter the peritoneal cavity. With gentle blunt dissection I was able to separate the omentum from the anterior abdominal wall and create a plane. In the right lower quadrant I encountered succus entericus load had no foul odor to it. Look like it could possibly, also from a perforated duodenal ulcer. I made my midline incision little higher and had anesthesia inflate the stomach and a submerged the stomach looking for an occult leak. None was seen. We then identified the ligament of Treitz and ran the small bowel distally to the anastomosis where there was a lot of yellow purulence and opening it appeared to be on the TA 60 staple line from closure of the common defect.    We looked in the pelvis and  saw no evidence of pelvic leakage and the staining around this anastomosis indicated the source of the leak. Proximal and distal to this anastomosis with the mesentery in a carefully applied the 7.5 cm GIA. In clamping it down the first time I felt some fracturing was a good indication of the amount of edema that was in the bowel wall and this reinforced our inclination not to try to resect and re\re anastomosis. Divided both the ends and then went through the mesentery with Kelly clamps and oversewing the mesentery with 2-0 silk horizontal mattress sutures. I then went to the mark placed on the left side as the other mark in the right upper quadrant was too far and would be tethered to much and proceeded to create an ostomy.  Lori McNichol had previously marked the spot is an optimal site for an ostomy. I made a cruciate incision in the fascia and dilated this to 4 finger breaths and only then was able to bring out this edematous end ileostomy.  This was sutured to the skin with many interrupted 3-0 chromic sutures. It irrigated nicely and we ablated the omentum over the wound. The terminal distal small intestinal piece had to 3-0 Prolene was placed along the staple line 2 retention sutures were placed above and below the ostomy site. The wound fascia was closed with running double-stranded #1 PDS from above and below and tied strands individually in the midline. Wounds including the right lower corner an  incision which seemed to be healing okay were packed with Betadine gauze and I created and placed away from around the bowel prior to opening it and placing the appliance on it. Dr. Council Mechanic  placed a central line in the patient was taken to the recovery room prior to transfer to the intensive care unit. The critical care medicine service has been counseled to to assist in ICU management.  Matt B. Daphine Deutscher, MD, [facility], [place] [phone]

## 2011-05-14 NOTE — ED Notes (Signed)
Returned from CT.

## 2011-05-14 NOTE — ED Provider Notes (Signed)
History     CSN: [id number] Arrival date & time: 05/14/2011  3:06 AM   First MD Initiated Contact with Patient 05/14/11 [phone]      Chief Complaint  Patient presents with  . Abdominal Pain    s/p ileostomy take-down.     (Consider location/radiation/quality/duration/timing/severity/associated sxs/prior treatment) HPI Comments: 60 year old female with a complicated abdominal surgery past 2 months. 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 Shortly thereafter she had abs nd hemoperitoneum which required that she go back to the operating room. At that time she had a temporary ileostomy placed. This was taken down approximately 2 weeks ago and she has been out of the hospital for approximately 6 days. She has been doing well until tonight when she developed severe onset of abdominal pain, associated with nausea and low blood pressure. She denies blood in the stools or difficulty urinating. Pain is severe, constant, worse with palpation or any movement or coughing  Patient is a 60 y.o. female presenting with abdominal pain. The history is provided by the patient.  Abdominal Pain The primary symptoms of the illness include abdominal pain.    Past Medical History  Diagnosis Date  . HYPOTHYROIDISM 10/06/2008  . HYPERLIPIDEMIA 10/06/2008  . DEPRESSION 02/23/2010  . HYPERTENSION 10/06/2008  . DIVERTICULITIS, ACUTE 11/10/2008  . ALLERGIC RHINITIS 07/23/2009  . Diverticulosis of colon 11/10/2008    Past Surgical History  Procedure Date  . Abdominal hysterectomy 1986  . Thyroid surgery 1982    tumor removed  . Cesarean section 1983  . Rotator cuff repair 2005  . Kidney stone surgery 2000, 2009, 2010  . Laparoscopic assissted total colectomy w/ j-pouch 03/01/11  . 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 Arthritis Other   . Mental illness Other   . COPD Mother   . Deep vein thrombosis Father     History  Substance Use Topics  . Smoking status: Former Smoker -- 1.0 packs/day for 10 years    Types: Cigarettes    Quit date: 08/02/1980  . Smokeless tobacco: Never Used  . Alcohol Use: No    OB History    Grav Para Term Preterm Abortions TAB SAB Ect Mult Living                  Review of Systems  Gastrointestinal: Positive for abdominal pain.  All other systems reviewed and are negative.    Allergies  Modafinil  Home Medications   No current outpatient prescriptions on file.  BP 100/57  Pulse 108  Temp(Src) 98 F (36.7 C) (Oral)  Resp 14  Ht 5\' 7"  (1.702 m)  Wt 206 lb 2.1 oz (93.5 kg)  BMI 32.28 kg/m2  SpO2 97%  Physical Exam  Nursing note and vitals reviewed. Constitutional: She appears well-developed and well-nourished. She appears distressed.  HENT:  Head: Normocephalic and atraumatic.  Mouth/Throat: Oropharynx is clear and moist. No oropharyngeal exudate.  Eyes: Conjunctivae and EOM are normal. Pupils are equal, round, and reactive to light. Right eye exhibits no discharge. Left eye exhibits no discharge. No scleral icterus.  Neck: Normal range of motion. Neck supple. No JVD present. No thyromegaly present.  Cardiovascular: Normal rate, regular rhythm, normal heart sounds and intact distal pulses.  Exam reveals no gallop and no friction rub.  No murmur heard. Pulmonary/Chest: Effort normal and breath sounds normal. No respiratory distress. She has no wheezes. She has no rales.  Abdominal: Soft. Bowel sounds are normal. She exhibits distension. She exhibits no mass. There is tenderness. There is rebound and guarding.       Severe diffuse tenderness, positive peritoneal sign  Musculoskeletal: Normal range of motion. She exhibits no edema and no tenderness.  Lymphadenopathy:    She has no cervical adenopathy.  Neurological: She is alert.  Coordination normal.  Skin: Skin is warm and dry. No rash noted. No erythema.  Psychiatric: She has a normal mood and affect. Her behavior is normal.    ED Course  Procedures (including critical care time)  Labs Reviewed  URINALYSIS, ROUTINE W REFLEX MICROSCOPIC - Abnormal; Notable for the following:    APPearance CLOUDY (*)    Leukocytes, UA SMALL (*)    All other components within normal limits  PROTIME-INR - Abnormal; Notable for the following:    Prothrombin Time 16.8 (*)    All other components within normal limits  CBC - Abnormal; Notable for the following:    WBC 3.4 (*)    RBC 2.88 (*)    Hemoglobin 8.3 (*)    HCT 25.3 (*)    All other components within normal limits  DIFFERENTIAL - Abnormal; Notable for the following:    Monocytes Relative 2 (*)    All other components within normal limits  BASIC METABOLIC PANEL - Abnormal; Notable for the following:    Potassium 2.8 (*)    Glucose, Bld 103 (*)    Creatinine, Ser 1.48 (*)    Calcium 8.3 (*)    GFR calc non Af Amer 38 (*)    GFR calc Af Amer 44 (*)    All other components within normal limits  LACTIC ACID, PLASMA - Abnormal; Notable for the following:    Lactic Acid, Venous 3.0 (*)    All other components within normal limits  URINE MICROSCOPIC-ADD ON - Abnormal; Notable for the following:    Bacteria, UA MANY (*)    All other components within normal limits  BLOOD GAS, ARTERIAL - Abnormal; Notable for the following:    pH, Arterial 7.308 (*)    Bicarbonate 18.0 (*)    Acid-base deficit 7.1 (*)    All other components within normal limits  CBC - Abnormal; Notable for the following:    WBC 11.9 (*)    RBC 3.71 (*)    Hemoglobin 11.0 (*)    HCT 31.7 (*)    All other components within normal limits  COMPREHENSIVE METABOLIC PANEL - Abnormal; Notable for the following:    Glucose, Bld 105 (*)    Calcium 7.2 (*)    Total Protein 4.6 (*)    Albumin 1.8 (*)    GFR calc non Af Amer 65 (*)    GFR calc Af Amer 75  (*)    All other components within normal limits  MAGNESIUM - Abnormal; Notable for the following:    Magnesium 1.0 (*)    All other components within normal limits  LACTIC ACID, PLASMA - Abnormal; Notable for the following:    Lactic Acid, Venous 2.5 (*)    All other components within normal limits  DIFFERENTIAL - Abnormal; Notable for the following:    Neutrophils Relative 89 (*)    Lymphocytes Relative 7 (*)    Neutro Abs 10.6 (*)    All other components within normal limits  APTT  TYPE AND SCREEN  PREPARE RBC (CROSSMATCH)  PHOSPHORUS  CORTISOL  CBC  BASIC METABOLIC PANEL  BLOOD GAS, ARTERIAL  MRSA PCR SCREENING   Ct Abdomen Pelvis W Contrast  05/14/2011  *RADIOLOGY REPORT*  Clinical Data: Recent surgery, abdominal pain  CT ABDOMEN AND PELVIS WITH CONTRAST  Technique:  Multidetector CT imaging of the abdomen and pelvis was performed following the standard protocol during bolus administration of intravenous contrast.  Contrast: OMNIPAQUE IOHEXOL 300 MG/ML IV SOLN  Comparison: 05/09/2011 CT  Findings: Heart size upper normal limits to mildly enlarged. Bibasilar linear opacities.  Multiple hypodensities within the liver are unchanged. Unremarkable spleen, pancreas, adrenal glands.  Symmetric renal enhancement.  There are nonobstructing left renal stones. Nonobstructing lower pole right renal stones as well.  Too small further characterize bilateral renal hypodensities.  The gallbladder is distended.  No radiodense gallstones.  No biliary ductal dilatation.  Interval increased free intraperitoneal air and fluid.  Bowel anastomotic suture in the pelvis status post subtotal colectomy. Bowel anastomotic suture noted in the right mid abdomen.  There are thickened/inflamed appearing loops of small bowel near the anastomosis in the right mid abdomen.  Prominent lymph nodes within the right lower quadrant, measuring up to 1.3 cm short axis.  Small fluid pocket at the ileostomy takedown site  within the subcutaneous fat superficial to the peritoneum is nonspecific.  Thrombus within the SMV.  The portal vein and splenic vein are patent.  The SMA, celiac axis, and IMA are patent.  Scattered atherosclerosis.  Thin-walled bladder.  Absent uterus.  No adnexal mass.  No acute osseous abnormality.  IMPRESSION: Interval perforation or anastamotic leak, with increased free intraperitoneal air and fluid.  Thickened loop of small bowel within the right lower abdomen. Given the SMV thrombus, this may represent ischemic changes.  Enlarged ileocolic lymph node measuring 1.3 cm short axis.  Given the adjacent inflammatory changes, may simply be reactive. However, if the patient underwent a colectomy for malignancy, metastatic involvement of this lymph node should also be considered.  Critical Value/emergent results were called by telephone at the time of interpretation on 05/14/2011  at 06:40 a.m.  to the surgeon, Dr. Daphine Deutscher, who verbally acknowledged these results.  Original Report Authenticated By: Waneta Martins, M.D.   Dg Chest Port 1 View  05/14/2011  *RADIOLOGY REPORT*  Clinical Data: Evaluate ET tube placement and right IJ placement  PORTABLE CHEST - 1 VIEW  Comparison: 05/14/2011  Findings: There is a ET tube with tip above the carina.  There is a right IJ catheter with tip in the cavoatrial junction.  The nasogastric tube tip is in the stomach.  Heart size is normal. Since the earlier exam there has been increase in interstitial markings suggesting pulmonary edema.  Atelectasis is noted within both lung bases.  IMPRESSION:  1.  The ET tube and the right IJ catheter are in satisfactory position.  No pneumothorax identified. 2.  Suspect increase in pulmonary edema with bibasilar atelectasis.  Original Report Authenticated By: Rosealee Albee, M.D.   Dg Chest Port 1 View  05/14/2011  *RADIOLOGY REPORT*  Clinical Data: Shortness of breath  PORTABLE CHEST - 1 VIEW  Comparison: 02/23/2011  Findings:  Heart size upper normal limits to mildly enlarged.  No focal consolidation.  No pleural effusion or pneumothorax.  No acute osseous abnormality.  IMPRESSION: Heart size upper normal limits to mildly enlarged without focal consolidation.  Original Report Authenticated By: Waneta Martins, M.D.  1. Acute abdomen   2. Hypokalemia   3. Anemia   4.  Bowel perforation    MDM  Patient is hypotensive, pale, tachycardic and has a peritoneal abdomen. Portable chest x-ray pending, labwork, anticipate surgical consultation.  IV fluids for hypotension, lactic acid, pain control   X-ray without acute air, patient is persistently peritoneal and hypotensive  Discussed with Dr. Daphine Deutscher, labs had to be redrawn because the sample hemolyzed. Hypotension is currently being treated with IV fluid boluses. CT scan scheduled. Dr. Daphine Deutscher will see, antibiotics ordered  Labs reveal an elevated lactic acid, increased anemia and hypokalemia.   CRITICAL CARE Performed by: Vida Roller   Total critical care time: 35  Critical care time was exclusive of separately billable procedures and treating other patients.  Critical care was necessary to treat or prevent imminent or life-threatening deterioration.  Critical care was time spent personally by me on the following activities: development of treatment plan with patient and/or surrogate as well as nursing, discussions with consultants, evaluation of patient's response to treatment, examination of patient, obtaining history from patient or surrogate, ordering and performing treatments and interventions, ordering and review of laboratory studies, ordering and review of radiographic studies, pulse oximetry and re-evaluation of patient's condition.   Vida Roller, MD 05/14/11 [phone]

## 2011-05-14 NOTE — Anesthesia Preprocedure Evaluation (Addendum)
Anesthesia Evaluation  Patient identified by MRN, date of birth, ID band Patient awake    Reviewed: Allergy & Precautions, H&P , NPO status , Patient's Chart, lab work & pertinent test results  Airway Mallampati: II TM Distance: >3 FB Neck ROM: Full   Comment: No trouble with intubation 05/02/11 Dental No notable dental hx.  Two broken teeth. Right upper and right lower:   Pulmonary neg pulmonary ROS, asthma , sleep apnea ,  No symptoms today.  Environmental triggered asthma (cold, smoke).  Rarely had symptoms clear to auscultation  Pulmonary exam normal       Cardiovascular hypertension, Pt. on medications neg cardio ROS Regular Normal    Neuro/Psych PSYCHIATRIC DISORDERS Depression Negative Neurological ROS  Negative Psych ROS   GI/Hepatic negative GI ROS, Neg liver ROS,   Endo/Other  Negative Endocrine ROSHypothyroidism   Renal/GU negative Renal ROSK 2.8 Ca 8.3 Cr 1.48  Genitourinary negative   Musculoskeletal negative musculoskeletal ROS (+)   Abdominal   Peds negative pediatric ROS (+)  Hematology negative hematology ROS (+) Anemia.. Hgb 8.3   Anesthesia Other Findings   Reproductive/Obstetrics negative OB ROS                         Anesthesia Physical Anesthesia Plan  ASA: III  Anesthesia Plan: General   Post-op Pain Management:    Induction: Intravenous, Rapid sequence and Cricoid pressure planned  Airway Management Planned: Oral ETT  Additional Equipment:   Intra-op Plan:   Post-operative Plan: Extubation in OR  Informed Consent: I have reviewed the patients History and Physical, chart, labs and discussed the procedure including the risks, benefits and alternatives for the proposed anesthesia with the patient or authorized representative who has indicated his/her understanding and acceptance.   Dental advisory given  Plan Discussed with: CRNA  Anesthesia Plan Comments:  (Low blood pressure.  May need blood transfusion.)      Anesthesia Quick Evaluation

## 2011-05-14 NOTE — H&P (Signed)
Chief Complaint:  Acute abdominal pain this morning  History of Present Illness:  [name] is an 60 y.o. female called her husband early this morning complaining of right sided abdominal pain which she said was a 10/10 that was localized to her ileostomy closure site initially and by the time that she arrived in the ED had become more generalized.  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  Informed consent was obtained from the patient and her husband about possible repeat ostomy and the risks of repeated surgery.    Past Medical History  Diagnosis Date  . HYPOTHYROIDISM 10/06/2008  . HYPERLIPIDEMIA 10/06/2008  . DEPRESSION 02/23/2010  . HYPERTENSION 10/06/2008  . DIVERTICULITIS, ACUTE 11/10/2008  . ALLERGIC RHINITIS 07/23/2009  . Diverticulosis of colon 11/10/2008    Past Surgical History  Procedure Date  . Abdominal hysterectomy 1986  . Thyroid surgery 1982    tumor removed  . Cesarean section 1983  . Rotator cuff repair 2005  . Kidney stone surgery 2000, 2009, 2010  . Laparoscopic assissted total colectomy w/ j-pouch 03/01/11  . Ileostomy closure 05/02/2011    Procedure: ILEOSTOMY TAKEDOWN;  Surgeon: Mariella Saa, MD;  Location: [facility];  Service: General;  Laterality: N/A;    Medications Prior to Admission  Medication Dose Route Frequency Provider Last Rate Last Dose  . Ampicillin-Sulbactam (UNASYN) 3 g in sodium chloride 0.9 % 100 mL IVPB  3 g Intravenous Once Vida Roller, MD   3 g at 05/14/11 1191  . HYDROmorphone (DILAUDID) injection 0.5 mg  0.5 mg Intravenous Once Vida Roller, MD   0.5 mg at 05/14/11 0402  . iohexol (OMNIPAQUE) 300 MG/ML solution 100 mL  100 mL Intravenous Once PRN Medication Radiologist   100 mL at 05/14/11 0627  . sodium chloride 0.9 % bolus 1,000 mL  1,000 mL Intravenous Once Vida Roller, MD   1,000 mL at 05/14/11 0339    . sodium chloride 0.9 % bolus 1,000 mL  1,000 mL Intravenous Once Vida Roller, MD   1,000 mL at 05/14/11 0518   Medications Prior to Admission  Medication Sig Dispense Refill  . acetaminophen (TYLENOL) 500 MG tablet Take 1,000 mg by mouth every 6 (six) hours as needed. FOR PAIN       . albuterol (PROVENTIL HFA) 108 (90 BASE) MCG/ACT inhaler Inhale 2 puffs into the lungs every 6 (six) hours as needed.  1 Inhaler  5  . ciprofloxacin (CIPRO) 500 MG tablet Take 1 tablet (500 mg total) by mouth 2 (two) times daily.  14 tablet  0  . HYDROcodone-acetaminophen (NORCO) 5-325 MG per tablet 1-2 tabs every 4-6 hours prn pain  30 tablet  2  . losartan (COZAAR) 50 MG tablet TAKE 1 TABLET BY MOUTH ONCE DAILY  30 tablet  11  . meloxicam (MOBIC) 15 MG tablet Take 15 mg by mouth at bedtime.       . montelukast (SINGULAIR) 10 MG tablet Take 10 mg by mouth at bedtime.       . sertraline (ZOLOFT) 100 MG tablet Take 100 mg by mouth daily.       Marland Kitchen SYNTHROID 137 MCG tablet Take 137 mcg by mouth daily.  30 tablet  11  . zolpidem (AMBIEN) 10 MG tablet take 1 tablet by mouth at bedtime if needed  30 tablet  5   Allergies  Allergen Reactions  . Modafinil     REACTION: rash   Family History  Problem Relation Age of Onset  . Arthritis Other   . Mental illness Other   . COPD Mother   . Deep vein thrombosis Father    Social History:   reports that she quit smoking about 30 years ago. Her smoking use included Cigarettes. She has a 10 pack-year smoking history. She has never used smokeless tobacco. She reports that she does not drink alcohol or use illicit drugs.   REVIEW OF SYSTEMS - PERTINENT POSITIVES ONLY: Neg for DVT but there was DVT in her father.  Bilateral kidney stones  Physical Exam:   Blood pressure 86/47, pulse 103, temperature 99.1 F (37.3 C), temperature source Oral, resp. rate 18, SpO2 95.00%. There is no height or weight on file to calculate BMI.  Gen:  Mod obese, pale white female    Neurological: Alert and oriented to person, place, and time. Coordination normal.  Head: Normocephalic and atraumatic.  Eyes: Conjunctivae are normal. Pupils are equal, round, and reactive to light. No scleral icterus.  Neck: Normal range of motion. Neck supple. No tracheal deviation or thyromegaly present.  Cardiovascular:  SR without murmurs or gallops Respiratory: Effort normal.  No respiratory distress. GI: ileotomy wound clean, generalized tenderness and rebound GU:   Musculoskeletal: Normal range of motion. Extremities are nontender.  Lymphadenopathy: No cervical, preauricular, postauricular or axillary adenopathy is present Skin: Skin is warm and dry. No rash noted. No diaphoresis. No erythema. No pallor. No clubbing, cyanosis, or edema. Previous excoriation of skin appears to have cleared.  Pscyh: Normal mood and affect. Behavior is normal. Judgment and thought content normal.   LABORATORY RESULTS: Results for orders placed during the hospital encounter of 05/14/11 (from the past 48 hour(s))  APTT     Status: Normal   Collection Time   05/14/11  3:38 AM      Component Value Range Comment   aPTT 25  24 - 37 (seconds)   PROTIME-INR     Status: Abnormal   Collection Time   05/14/11  3:38 AM      Component Value Range Comment   Prothrombin Time 16.8 (*) 11.6 - 15.2 (seconds)    INR 1.34  0.00 - 1.49    CBC     Status: Abnormal   Collection Time   05/14/11  4:45 AM      Component Value Range Comment   WBC 3.4 (*) 4.0 - 10.5 (K/uL)    RBC 2.88 (*) 3.87 - 5.11 (MIL/uL)    Hemoglobin 8.3 (*) 12.0 - 15.0 (g/dL)    HCT 40.9 (*) 81.1 - 46.0 (%)    MCV 87.8  78.0 - 100.0 (fL)    MCH 28.8  26.0 - 34.0 (pg)    MCHC 32.8  30.0 - 36.0 (g/dL)    RDW 91.4  78.2 - 95.6 (%)    Platelets 276  150 - 400 (K/uL)   DIFFERENTIAL     Status: Abnormal   Collection Time   05/14/11  4:45 AM      Component Value Range Comment   Neutrophils Relative 77  43 - 77 (%)    Lymphocytes Relative 21   12 - 46 (%)    Monocytes Relative 2 (*) 3 - 12 (%)    Eosinophils Relative 0  0 - 5 (%)    Basophils Relative 0  0 - 1 (%)  Neutro Abs 2.6  1.7 - 7.7 (K/uL)    Lymphs Abs 0.7  0.7 - 4.0 (K/uL)    Monocytes Absolute 0.1  0.1 - 1.0 (K/uL)    Eosinophils Absolute 0.0  0.0 - 0.7 (K/uL)    Basophils Absolute 0.0  0.0 - 0.1 (K/uL)    RBC Morphology ELLIPTOCYTES      WBC Morphology MILD LEFT SHIFT (1-5% METAS, OCC MYELO, OCC BANDS)     BASIC METABOLIC PANEL     Status: Abnormal   Collection Time   05/14/11  4:45 AM      Component Value Range Comment   Sodium 136  135 - 145 (mEq/L)    Potassium 2.8 (*) 3.5 - 5.1 (mEq/L)    Chloride 103  96 - 112 (mEq/L)    CO2 22  19 - 32 (mEq/L)    Glucose, Bld 103 (*) 70 - 99 (mg/dL)    BUN 15  6 - 23 (mg/dL)    Creatinine, Ser 1.61 (*) 0.50 - 1.10 (mg/dL)    Calcium 8.3 (*) 8.4 - 10.5 (mg/dL)    GFR calc non Af Amer 38 (*) >90 (mL/min)    GFR calc Af Amer 44 (*) >90 (mL/min)   LACTIC ACID, PLASMA     Status: Abnormal   Collection Time   05/14/11  4:45 AM      Component Value Range Comment   Lactic Acid, Venous 3.0 (*) 0.5 - 2.2 (mmol/L)   TYPE AND SCREEN     Status: Normal   Collection Time   05/14/11  4:45 AM      Component Value Range Comment   ABO/RH(D) A POS      Antibody Screen NEG      Sample Expiration 05/17/2011       RADIOLOGY RESULTS: Ct Abdomen Pelvis W Contrast  05/14/2011  *RADIOLOGY REPORT*  Clinical Data: Recent surgery, abdominal pain  CT ABDOMEN AND PELVIS WITH CONTRAST  Technique:  Multidetector CT imaging of the abdomen and pelvis was performed following the standard protocol during bolus administration of intravenous contrast.  Contrast: OMNIPAQUE IOHEXOL 300 MG/ML IV SOLN  Comparison: 05/09/2011 CT  Findings: Heart size upper normal limits to mildly enlarged. Bibasilar linear opacities.  Multiple hypodensities within the liver are unchanged. Unremarkable spleen, pancreas, adrenal glands.  Symmetric renal enhancement.   There are nonobstructing left renal stones. Nonobstructing lower pole right renal stones as well.  Too small further characterize bilateral renal hypodensities.  The gallbladder is distended.  No radiodense gallstones.  No biliary ductal dilatation.  Interval increased free intraperitoneal air and fluid.  Bowel anastomotic suture in the pelvis status post subtotal colectomy. Bowel anastomotic suture noted in the right mid abdomen.  There are thickened/inflamed appearing loops of small bowel near the anastomosis in the right mid abdomen.  Prominent lymph nodes within the right lower quadrant, measuring up to 1.3 cm short axis.  Small fluid pocket at the ileostomy takedown site within the subcutaneous fat superficial to the peritoneum is nonspecific.  Thrombus within the SMV.  The portal vein and splenic vein are patent.  The SMA, celiac axis, and IMA are patent.  Scattered atherosclerosis.  Thin-walled bladder.  Absent uterus.  No adnexal mass.  No acute osseous abnormality.  IMPRESSION: Interval perforation or anastamotic leak, with increased free intraperitoneal air and fluid.  Thickened loop of small bowel within the right lower abdomen. Given the SMV thrombus, this may represent ischemic changes.  Enlarged ileocolic lymph node measuring 1.3  cm short axis.  Given the adjacent inflammatory changes, may simply be reactive. However, if the patient underwent a colectomy for malignancy, metastatic involvement of this lymph node should also be considered.  Critical Value/emergent results were called by telephone at the time of interpretation on 05/14/2011  at 06:40 a.m.  to the surgeon, Dr. Daphine Deutscher, who verbally acknowledged these results.  Original Report Authenticated By: Waneta Martins, M.D.   Dg Chest Port 1 View  05/14/2011  *RADIOLOGY REPORT*  Clinical Data: Shortness of breath  PORTABLE CHEST - 1 VIEW  Comparison: 02/23/2011  Findings: Heart size upper normal limits to mildly enlarged.  No focal  consolidation.  No pleural effusion or pneumothorax.  No acute osseous abnormality.  IMPRESSION: Heart size upper normal limits to mildly enlarged without focal consolidation.  Original Report Authenticated By: Waneta Martins, M.D.    Problem List: Active Problems:  * No active hospital problems. 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   Plan: Exploratory laparotomy with possible new ileostomy    Matt B. Daphine Deutscher, MD, [facility] [phone] beeper [phone]  05/14/2011 6:57 AM

## 2011-05-14 NOTE — ED Notes (Signed)
Martin, MD at bedside.

## 2011-05-14 NOTE — Preoperative (Signed)
Beta Blockers   Reason not to administer Beta Blockers:Not Applicable 

## 2011-05-14 NOTE — Consult Note (Signed)
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  Prior h/o sub total colectomy 10/12 for diverticulosis. 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   Lines / Drains: ETT 12/16 >> RIJ 12/16 >>  Cultures: Peritoneal fluid 12/16 >>  Antibiotics: unasyn preop  Ertapenem 12/16 >>  Tests / Events: 12/16 pressors weaned off  The patient is sedated, intubated and unable to provide history, which was obtained for available medical records & from husband.    Past Medical History  Diagnosis Date  . HYPOTHYROIDISM 10/06/2008  . HYPERLIPIDEMIA 10/06/2008  . DEPRESSION 02/23/2010  . HYPERTENSION 10/06/2008  . DIVERTICULITIS, ACUTE 11/10/2008  . ALLERGIC RHINITIS 07/23/2009  . Diverticulosis of colon 11/10/2008   Past Surgical History  Procedure Date  . Abdominal hysterectomy 1986  . Thyroid surgery 1982    tumor removed  . Cesarean section 1983  . Rotator cuff repair 2005  . Kidney stone surgery 2000, 2009, 2010  . Laparoscopic assissted total colectomy w/ j-pouch 03/01/11  . Ileostomy closure 05/02/2011    Procedure: ILEOSTOMY TAKEDOWN;  Surgeon: Mariella Saa, MD;  Location: [facility];  Service: General;  Laterality: N/A;   Prior to Admission medications   Medication Sig Start Date End Date Taking? Authorizing Provider  acetaminophen (TYLENOL) 500 MG tablet Take 1,000 mg by mouth every 6 (six) hours as needed. FOR PAIN    Yes Historical Provider, MD  albuterol (PROVENTIL HFA) 108 (90 BASE) MCG/ACT inhaler Inhale 2 puffs into the lungs every 6 (six) hours as needed. 08/03/10  Yes Bruce W Burchette  ciprofloxacin (CIPRO) 500 MG tablet Take  1 tablet (500 mg total) by mouth 2 (two) times daily. 05/08/11 05/18/11 Yes Mariella Saa, MD  HYDROcodone-acetaminophen [facility]) 5-325 MG per tablet 1-2 tabs every 4-6 hours prn pain 05/08/11  Yes Mariella Saa, MD  losartan (COZAAR) 50 MG tablet TAKE 1 TABLET BY MOUTH ONCE DAILY 11/21/10  Yes Bruce W Burchette  meloxicam (MOBIC) 15 MG tablet Take 15 mg by mouth at bedtime.    Yes Historical Provider, MD  montelukast (SINGULAIR) 10 MG tablet Take 10 mg by mouth at bedtime.  12/07/10  Yes Bruce W Burchette  sertraline (ZOLOFT) 100 MG tablet Take 100 mg by mouth daily.  02/08/11  Yes Bruce W Burchette  SYNTHROID 137 MCG tablet Take 137 mcg by mouth daily. 08/03/10  Yes Bruce W Burchette  zolpidem (AMBIEN) 10 MG tablet take 1 tablet by mouth at bedtime if needed 09/26/10  Yes Bruce W Burchette   Allergies Allergies  Allergen Reactions  . Modafinil     REACTION: rash    Family History Family History  Problem Relation Age of Onset  . Arthritis Other   . Mental illness Other   . COPD Mother   . Deep vein thrombosis Father     Social History  reports that she quit smoking about 30 years ago. Her smoking use included Cigarettes. She has a 10 pack-year smoking history. She has never used smokeless tobacco. She reports that she does not drink alcohol or use illicit drugs.  Review Of Systems  11 points review of systems is negative with  an exception of listed in HPI.  Vital Signs: Temp:  [95.8 F (35.4 C)-99.2 F (37.3 C)] 97.4 F (36.3 C) (12/16 1600) Pulse Rate:  [101-124] 108  (12/16 1700) Resp:  [10-18] 15  (12/16 1700) BP: (74-131)/(35-72) 94/62 mmHg (12/16 1600) SpO2:  [95 %-99 %] 99 % (12/16 1700) Arterial Line BP: (91-154)/(60-67) 91/60 mmHg (12/16 1700) FiO2 (%):  [49.7 %-50.3 %] 49.8 % (12/16 1700) Weight:  [83.915 kg (185 lb)-93.5 kg (206 lb 2.1 oz)] 206 lb 2.1 oz (93.5 kg) (12/16 1110) I/O last 3 completed shifts: In: 7250 [I.V.:6150; Blood:1050; IV  Piggyback:50] Out: 735 [Urine:585; Blood:150]  Physical Examination: General:  Chronically ill appearing Neuro:  Sedated, unresponsive   HEENT:  No jvd, PEERL Neck:  No jvd, LNS   Cardiovascular:  s1s2 tachy, regular Lungs:  Decreased BL, no rhonchi, peak pr 24 Abdomen:  Soft, distended, non tender, ostomy pink Musculoskeletal:  No edema Skin:  Warm, no rash  Ventilator settings: Vent Mode:  [-] PRVC FiO2 (%):  [49.7 %-50.3 %] 49.8 % Set Rate:  [10 bmp] 10 bmp Vt Set:  [530 mL] 530 mL PEEP:  [5 cmH20] 5 cmH20  Labs and Imaging:  Reviewed.  Please refer to the Assessment and Plan section for relevant results. CBC    Component Value Date/Time   WBC 11.9* 05/14/2011 1515   RBC 3.71* 05/14/2011 1515   HGB 11.0* 05/14/2011 1515   HCT 31.7* 05/14/2011 1515   PLT 261 05/14/2011 1515   MCV 85.4 05/14/2011 1515   MCH 29.6 05/14/2011 1515   MCHC 34.7 05/14/2011 1515   RDW 15.2 05/14/2011 1515   LYMPHSABS 0.8 05/14/2011 1515   MONOABS 0.5 05/14/2011 1515   EOSABS 0.0 05/14/2011 1515   BASOSABS 0.0 05/14/2011 1515    BMET    Component Value Date/Time   NA 137 05/14/2011 1515   K 3.8 05/14/2011 1515   CL 107 05/14/2011 1515   CO2 20 05/14/2011 1515   GLUCOSE 105* 05/14/2011 1515   BUN 16 05/14/2011 1515   CREATININE 0.95 05/14/2011 1515   CALCIUM 7.2* 05/14/2011 1515   CALCIUM 10.2 04/02/2011 0440   GFRNONAA 65* 05/14/2011 1515   GFRAA 75* 05/14/2011 1515     Assessment and Plan: Acute resp failure -post op Hold off extubation x 24h to ensure stability CXR shows food placement of devices, mild fluid overload  Septic shock - resolved, off pressors, fluids per CVP since some overload on CXR  ID- would prefer Invanz for peritonitis, received unasyn pre-op Await cx data, note was on cipro 12/10 onwards for UTI  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  Best practices / Disposition: -->ICU status  -->full  code -->SCds for DVT Px -->Protonix for GI Px -->ventilator bundle -->npo -->Husband updated at bedside  The patient is critically ill with multiple organ systems failure and requires high complexity decision making for assessment and support, frequent evaluation and titration of therapies, application of advanced monitoring technologies and extensive interpretation of multiple databases. Critical Care Time devoted to patient care services described in this note is 45 minutes.  Ashleigh Arya V. 05/14/2011, 7:01 PM

## 2011-05-14 NOTE — ED Notes (Signed)
Dr. Hyacinth Meeker notified of patient's BP and need for additional pain medication.

## 2011-05-14 NOTE — Transfer of Care (Signed)
Immediate Anesthesia Transfer of Care Note  Patient: [name]  Procedure(s) Performed:  EXPLORATORY LAPAROTOMY - small bowel resection end ileostomy  Patient Location: ICU, rm 1233  Anesthesia Type: General  Level of Consciousness: unresponsive, pt sedated  Airway & Oxygen Therapy: Patient remains intubated per anesthesia plan and Patient placed on Ventilator (see vital sign flow sheet for setting)  Post-op Assessment: Post -op Vital signs reviewed and stable  Post vital signs: Reviewed and stable  Complications: No apparent anesthesia complications

## 2011-05-14 NOTE — Anesthesia Postprocedure Evaluation (Signed)
  Anesthesia Post-op Note  Patient: [name]  Procedure(s) Performed:  EXPLORATORY LAPAROTOMY - small bowel resection end ileostomy  Patient Location: PACU  Anesthesia Type: General  Level of Consciousness: sedated  Airway and Oxygen Therapy: intubated, and ventilated  Post-op Pain: mild  Post-op Assessment: septic picture  Post-op Vital Signs: stable, blood pressure improving.  Complications: No apparent anesthesia complications

## 2011-05-14 NOTE — ED Notes (Signed)
Pt c/o uncontrolled pain at site of ileostomy take-down. Pt presents as pale and hypotensive.

## 2011-05-14 NOTE — ED Notes (Signed)
Patient transported to CT 

## 2011-05-15 ENCOUNTER — Encounter (HOSPITAL_COMMUNITY): Payer: Self-pay | Admitting: Surgery

## 2011-05-15 ENCOUNTER — Inpatient Hospital Stay (HOSPITAL_COMMUNITY): Payer: Managed Care, Other (non HMO)

## 2011-05-15 LAB — BLOOD GAS, ARTERIAL
Bicarbonate: 18.5 mEq/L — ABNORMAL LOW (ref 20.0–24.0)
MECHVT: 530 mL
O2 Saturation: 100.1 %
PEEP: 5 cmH2O
Patient temperature: 98.6
pH, Arterial: 7.425 — ABNORMAL HIGH (ref 7.350–7.400)

## 2011-05-15 LAB — POCT I-STAT 7, (LYTES, BLD GAS, ICA,H+H)
Bicarbonate: 17.3 mEq/L — ABNORMAL LOW (ref 20.0–24.0)
HCT: 26 % — ABNORMAL LOW (ref 36.0–46.0)
Hemoglobin: 8.8 g/dL — ABNORMAL LOW (ref 12.0–15.0)
Potassium: 2.7 mEq/L — CL (ref 3.5–5.1)
Sodium: 138 mEq/L (ref 135–145)
pCO2 arterial: 44.9 mmHg (ref 35.0–45.0)
pH, Arterial: 7.195 — CL (ref 7.350–7.400)

## 2011-05-15 LAB — CBC
MCH: 29.2 pg (ref 26.0–34.0)
MCHC: 34.3 g/dL (ref 30.0–36.0)
MCV: 85 fL (ref 78.0–100.0)

## 2011-05-15 LAB — BASIC METABOLIC PANEL
BUN: 16 mg/dL (ref 6–23)
CO2: 20 mEq/L (ref 19–32)
Chloride: 112 mEq/L (ref 96–112)
GFR calc non Af Amer: 80 mL/min — ABNORMAL LOW (ref >90)
Glucose, Bld: 101 mg/dL — ABNORMAL HIGH (ref 70–99)
Potassium: 4.3 mEq/L (ref 3.5–5.1)
Sodium: 141 mEq/L (ref 135–145)

## 2011-05-15 LAB — POCT I-STAT 4, (NA,K, GLUC, HGB,HCT)
Glucose, Bld: 99 mg/dL (ref 70–99)
Hemoglobin: 6.1 g/dL — CL (ref 12.0–15.0)
Potassium: 2.5 mEq/L — CL (ref 3.5–5.1)

## 2011-05-15 MED ORDER — SODIUM CHLORIDE 0.9 % IJ SOLN: 9.0000 mL | INTRAMUSCULAR | Status: DC | PRN

## 2011-05-15 MED ORDER — NALOXONE HCL 0.4 MG/ML IJ SOLN: 0.4000 mg | INTRAMUSCULAR | Status: DC | PRN

## 2011-05-15 MED ORDER — DIPHENHYDRAMINE HCL 12.5 MG/5ML PO ELIX: 12.5000 mg | ORAL_SOLUTION | Freq: Four times a day (QID) | ORAL | Status: DC | PRN

## 2011-05-15 MED ORDER — ONDANSETRON HCL 4 MG/2ML IJ SOLN: 4.0000 mg | Freq: Four times a day (QID) | INTRAMUSCULAR | Status: DC | PRN

## 2011-05-15 MED ORDER — DIPHENHYDRAMINE HCL 50 MG/ML IJ SOLN: 12.5000 mg | Freq: Four times a day (QID) | INTRAMUSCULAR | Status: DC | PRN

## 2011-05-15 MED ORDER — MORPHINE SULFATE (PF) 1 MG/ML IV SOLN
INTRAVENOUS | Status: DC
  Administered 2011-05-15: 18 mg via INTRAVENOUS
  Administered 2011-05-15 (×2): via INTRAVENOUS
  Administered 2011-05-15: 6 mg via INTRAVENOUS
  Administered 2011-05-16: 3 mg via INTRAVENOUS
  Administered 2011-05-16: 22:00:00 via INTRAVENOUS
  Administered 2011-05-16: 43.5 mg via INTRAVENOUS
  Administered 2011-05-16: 3 mg via INTRAVENOUS
  Administered 2011-05-16 (×3): via INTRAVENOUS
  Administered 2011-05-17: 23.8 mg via INTRAVENOUS
  Administered 2011-05-17: 11.3 mg via INTRAVENOUS
  Administered 2011-05-17 (×2): via INTRAVENOUS
  Administered 2011-05-17: 6 mg via INTRAVENOUS
  Administered 2011-05-17: 9 mg via INTRAVENOUS
  Administered 2011-05-18: 24 mg via INTRAVENOUS
  Administered 2011-05-18: 9 mg via INTRAVENOUS
  Administered 2011-05-18: 13.5 mg via INTRAVENOUS
  Administered 2011-05-18: 16.35 mg via INTRAVENOUS
  Administered 2011-05-18: 25.14 mg via INTRAVENOUS
  Administered 2011-05-19: 7.5 mg via INTRAVENOUS
  Administered 2011-05-19: 16.36 mg via INTRAVENOUS
  Administered 2011-05-19: 8.58 mg via INTRAVENOUS
  Administered 2011-05-19 (×2): via INTRAVENOUS
  Administered 2011-05-20: 15 mg via INTRAVENOUS
  Administered 2011-05-20: 16.39 mg via INTRAVENOUS
  Administered 2011-05-20: 7.85 mg via INTRAVENOUS
  Administered 2011-05-20: 09:00:00 via INTRAVENOUS
  Filled 2011-05-15 (×16): qty 25

## 2011-05-15 MED ORDER — HEPARIN SOD (PORCINE) IN D5W 100 UNIT/ML IV SOLN
2300.0000 [IU]/h | INTRAVENOUS | Status: DC
  Administered 2011-05-15 – 2011-05-17 (×3): 1400 [IU]/h via INTRAVENOUS
  Administered 2011-05-18: 2300 [IU]/h via INTRAVENOUS
  Administered 2011-05-18: 1400 [IU]/h via INTRAVENOUS
  Administered 2011-05-18: 2300 [IU]/h via INTRAVENOUS
  Filled 2011-05-15 (×12): qty 250

## 2011-05-15 NOTE — Progress Notes (Signed)
ANTICOAGULATION CONSULT NOTE - Follow Up Consult  Pharmacy Consult for Heparin Indication: thrombosis of superior mesenteric vein.  Allergies  Allergen Reactions  . Modafinil     REACTION: rash    Patient Measurements: Height: 5\' 7"  (170.2 cm) Weight: 209 lb 3.5 oz (94.9 kg) IBW/kg (Calculated) : 61.6  Heparin dosing weight 82.4kg  Vital Signs: Temp: 98.5 F (36.9 C) (12/17 2000) Temp src: Oral (12/17 2000) BP: 108/63 mmHg (12/17 2000) Pulse Rate: 103  (12/17 2000)  Labs:  Basename 05/15/11 2045 05/15/11 0525 05/14/11 1515 05/14/11 0859 05/14/11 0445 05/14/11 0338  HGB -- 10.3* 11.0* -- -- --  HCT -- 30.0* 31.7* 26.0* -- --  PLT -- 336 261 -- 276 --  APTT -- -- -- -- -- 25  LABPROT -- -- -- -- -- 16.8*  INR -- -- -- -- -- 1.34  HEPARINUNFRC 0.36 -- -- -- -- --  CREATININE -- 0.80 0.95 -- 1.48* --  CKTOTAL -- -- -- -- -- --  CKMB -- -- -- -- -- --  TROPONINI -- -- -- -- -- --   Estimated Creatinine Clearance: 90.6 ml/min (by C-G formula based on Cr of 0.8).  Medical History: Past Medical History  Diagnosis Date  . HYPOTHYROIDISM 10/06/2008  . HYPERLIPIDEMIA 10/06/2008  . DEPRESSION 02/23/2010  . HYPERTENSION 10/06/2008  . DIVERTICULITIS, ACUTE 11/10/2008  . ALLERGIC RHINITIS 07/23/2009  . Diverticulosis of colon 11/10/2008    Medications:  Scheduled:     . ertapenem  1 g Intravenous Q24H  . levothyroxine  50 mcg Intravenous Daily  . morphine   Intravenous Q4H  . pantoprazole (PROTONIX) IV  40 mg Intravenous QHS  . DISCONTD: heparin  5,000 Units Subcutaneous Q8H  . DISCONTD: potassium chloride  10 mEq Intravenous Q1 Hr x 3   Infusions:     . 0.9 % NaCl with KCl 40 mEq / L 75 mL/hr at 05/15/11 1818  . heparin 1,400 Units/hr (05/15/11 1509)  . DISCONTD: sodium chloride    . DISCONTD: fentaNYL infusion INTRAVENOUS 50 mcg/hr (05/14/11 1947)  . DISCONTD: midazolam (VERSED) infusion      Assessment: 60 yo F POD #1 s/p bowel resection, anastomosis, end  ileostomy on 12/16 CT 12/16 shows Thrombus within the SMV. The portal vein and splenic vein are patent. The SMA, celiac axis, and IMA are patent. First heparin level returned therapeutic at 0.36.  Goal of Therapy:  Heparin level 0.3-0.7 units/ml   Plan:  Continue heparin infusion at 1400 units/hr (14 ml/hr) IV. Second heparin level to confirm infusion rate will default near daily morning heparin level draw, so will follow up then.  Clance Boll, PharmD Pager: [phone] 05/15/2011 10:00 PM

## 2011-05-15 NOTE — Progress Notes (Addendum)
ANTICOAGULATION CONSULT NOTE - Initial Consult  Pharmacy Consult for Heparin Indication: thrombosis of superior mesenteric vein.  Allergies  Allergen Reactions  . Modafinil     REACTION: rash    Patient Measurements: Height: 5\' 7"  (170.2 cm) Weight: 209 lb 3.5 oz (94.9 kg) IBW/kg (Calculated) : 61.6  Heparin dosing weight 82.4kg  Vital Signs: Temp: 98 F (36.7 C) (12/17 1200) Temp src: Oral (12/17 1200) BP: 104/62 mmHg (12/17 1200) Pulse Rate: 109  (12/17 1240)  Labs:  Basename 05/15/11 0525 05/14/11 1515 05/14/11 0859 05/14/11 0445 05/14/11 0338  HGB 10.3* 11.0* -- -- --  HCT 30.0* 31.7* 26.0* -- --  PLT 336 261 -- 276 --  APTT -- -- -- -- 25  LABPROT -- -- -- -- 16.8*  INR -- -- -- -- 1.34  HEPARINUNFRC -- -- -- -- --  CREATININE 0.80 0.95 -- 1.48* --  CKTOTAL -- -- -- -- --  CKMB -- -- -- -- --  TROPONINI -- -- -- -- --   Estimated Creatinine Clearance: 90.6 ml/min (by C-G formula based on Cr of 0.8).  Medical History: Past Medical History  Diagnosis Date  . HYPOTHYROIDISM 10/06/2008  . HYPERLIPIDEMIA 10/06/2008  . DEPRESSION 02/23/2010  . HYPERTENSION 10/06/2008  . DIVERTICULITIS, ACUTE 11/10/2008  . ALLERGIC RHINITIS 07/23/2009  . Diverticulosis of colon 11/10/2008    Medications:  Scheduled:    . aspirin  324 mg Oral NOW   Or  . aspirin  300 mg Rectal NOW  . DOPamine      . ertapenem  1 g Intravenous Q24H  . heparin  5,000 Units Subcutaneous Q8H  . levothyroxine  50 mcg Intravenous Daily  . morphine   Intravenous Q4H  . pantoprazole (PROTONIX) IV  40 mg Intravenous QHS  . DISCONTD: ampicillin-sulbactam (UNASYN) IV  3 g Intravenous Q6H  . DISCONTD: pantoprazole (PROTONIX) IV  40 mg Intravenous Q24H  . DISCONTD: potassium chloride  10 mEq Intravenous Q1 Hr x 3   Infusions:    . 0.9 % NaCl with KCl 40 mEq / L 75 mL/hr at 05/15/11 0954  . DISCONTD: sodium chloride    . DISCONTD: fentaNYL infusion INTRAVENOUS 50 mcg/hr (05/14/11 1947)  . DISCONTD:  midazolam (VERSED) infusion      Assessment: 60 yo F POD #1 s/p bowel resection, anastomosis, end ileostomy on 12/16 CT 12/16 shows Thrombus within the SMV. The portal vein and splenic vein are patent. The SMA, celiac axis, and IMA are patent. Currently on prophylactic heparin SQ TID Baseline coags 12/16 were within normal limits  Goal of Therapy:  Heparin level 0.3-0.7 units/ml   Plan:  D/C prophylactic SQ Heparin  Heparin infusion to start at 1400 units/hr (14 ml/hr) 6 hour heparin level at 2000 today Daily CBC and heparin level  Lynann Beaver PharmD  Pager [phone] 05/15/2011 1:31 PM

## 2011-05-15 NOTE — Progress Notes (Signed)
INITIAL ADULT NUTRITION ASSESSMENT Date: 05/15/2011   Time: 2:17 PM Reason for Assessment: consult  ASSESSMENT: Female 60 y.o.  Dx: small bowel resection  Hx:  Past Medical History  Diagnosis Date  . HYPOTHYROIDISM 10/06/2008  . HYPERLIPIDEMIA 10/06/2008  . DEPRESSION 02/23/2010  . HYPERTENSION 10/06/2008  . DIVERTICULITIS, ACUTE 11/10/2008  . ALLERGIC RHINITIS 07/23/2009  . Diverticulosis of colon 11/10/2008   Past Surgical History  Procedure Date  . Abdominal hysterectomy 1986  . Thyroid surgery 1982    tumor removed  . Cesarean section 1983  . Rotator cuff repair 2005  . Kidney stone surgery 2000, 2009, 2010  . Laparoscopic assissted total colectomy w/ j-pouch 03/01/11  . Ileostomy closure 05/02/2011    Procedure: ILEOSTOMY TAKEDOWN;  Surgeon: Mariella Saa, MD;  Location: [facility];  Service: General;  Laterality: N/A;  . Laparotomy 05/14/2011    Procedure: EXPLORATORY LAPAROTOMY;  Surgeon: Valarie Merino, MD;  Location: [facility];  Service: General;  Laterality: N/A;  small bowel resection end ileostomy    Related Meds:  Scheduled Meds:   . aspirin  324 mg Oral NOW   Or  . aspirin  300 mg Rectal NOW  . DOPamine      . ertapenem  1 g Intravenous Q24H  . levothyroxine  50 mcg Intravenous Daily  . morphine   Intravenous Q4H  . pantoprazole (PROTONIX) IV  40 mg Intravenous QHS  . DISCONTD: ampicillin-sulbactam (UNASYN) IV  3 g Intravenous Q6H  . DISCONTD: heparin  5,000 Units Subcutaneous Q8H  . DISCONTD: pantoprazole (PROTONIX) IV  40 mg Intravenous Q24H  . DISCONTD: potassium chloride  10 mEq Intravenous Q1 Hr x 3   Continuous Infusions:   . 0.9 % NaCl with KCl 40 mEq / L 75 mL/hr at 05/15/11 0954  . heparin    . DISCONTD: sodium chloride    . DISCONTD: fentaNYL infusion INTRAVENOUS 50 mcg/hr (05/14/11 1947)  . DISCONTD: midazolam (VERSED) infusion     PRN Meds:.diphenhydrAMINE, diphenhydrAMINE, naloxone, ondansetron (ZOFRAN) IV, sodium chloride, DISCONTD:  sodium chloride, DISCONTD: fentaNYL, DISCONTD: fentaNYL, DISCONTD: midazolam, DISCONTD: midazolam   Ht: 5\' 7"  (170.2 cm)  Wt: 209 lb 3.5 oz (94.9 kg)  Ideal Wt: 69.5 kg % Ideal Wt: 136%  Usual Wt: 91 kg (03/01/11) % Usual Wt: 104%  Body mass index is 32.77 kg/(m^2).  Food/Nutrition Related Hx:  Pt admitted with acute onset abdominal pain, s/p small bowel resection and new ileostomy.  Pt has had several surgeries recently due to chronic GI disease.  At last admission, pt did not require RD interventions as she was able to progress diet well and was eating 100% of meals at time of discharge.  Consult received for TF recs, possible trickle feeds per RN.  Labs:  CMP     Component Value Date/Time   NA 141 05/15/2011 0525   K 4.3 05/15/2011 0525   CL 112 05/15/2011 0525   CO2 20 05/15/2011 0525   GLUCOSE 101* 05/15/2011 0525   BUN 16 05/15/2011 0525   CREATININE 0.80 05/15/2011 0525   CALCIUM 7.7* 05/15/2011 0525   CALCIUM 10.2 04/02/2011 0440   PROT 4.6* 05/14/2011 1515   ALBUMIN 1.8* 05/14/2011 1515   AST 33 05/14/2011 1515   ALT 24 05/14/2011 1515   ALKPHOS 39 05/14/2011 1515   BILITOT 1.0 05/14/2011 1515   GFRNONAA 80* 05/15/2011 0525   GFRAA >90 05/15/2011 0525    Intake: n/a Output:   Intake/Output Summary (Last 24 hours) at  05/15/11 1427 Last data filed at 05/15/11 1200  Gross per 24 hour  Intake 2267.5 ml  Output   2435 ml  Net -167.5 ml   Positive output from ileostomy.  650 mL total yesterday which is appropriate.  Diet Order:  NPO  Supplements/Tube Feeding: none at this time  IVF:    0.9 % NaCl with KCl 40 mEq / L Last Rate: 75 mL/hr at 05/15/11 0954  heparin   DISCONTD: sodium chloride   DISCONTD: fentaNYL infusion INTRAVENOUS Last Rate: 50 mcg/hr (05/14/11 1947)  DISCONTD: midazolam (VERSED) infusion     Estimated Nutritional Needs:   Kcal: 1950-2230 kcal Protein: 90-104g Fluid:  >2.2 L/day  NUTRITION DIAGNOSIS: -Inadequate oral intake  (NI-2.1).  Status: Ongoing  RELATED TO: s/p small bowel resection  AS EVIDENCE BY: pt NPO, MD request for TF recommendations to be available if needed.  MONITORING/EVALUATION(Goals): 1.  Enteral nutrition; initiation with TF 2.  Food/Beverage; diet advancement to clear liquids if appropriate per MD  EDUCATION NEEDS: -No education needs identified at this time  INTERVENTION: 1.  Enteral nutrition; if warranted, recommend Osmolite 1.2 at 20 mL/hr continuous via NGT.  Advance by 10 mL q 6 hrs to 70 mL/hr goal to provide 2016 kcal, 93 g protein, 1377 mL free water. 2.  General diet; advance diet per MD discretion to Low fiber goal.  Dietitian #: [phone]  DOCUMENTATION CODES Per approved criteria  -Not Applicable    [name] 05/15/2011, 2:17 PM

## 2011-05-15 NOTE — Procedures (Signed)
Extubation Procedure Note  Patient Details:   Name: [name] DOB: 12/01/1950 MRN: [id number]   Airway Documentation:     Evaluation  O2 sats: stable throughout Complications: No apparent complications Patient did tolerate procedure well. Bilateral Breath Sounds: Clear   Yes  Leonides Schanz 05/15/2011, 6:25 PM

## 2011-05-15 NOTE — Progress Notes (Signed)
Patient ID: [name], female   DOB: 1950-12-13, 60 y.o.   MRN: [id number] 1 Day Post-Op  Subjective: Very alert on vent.  No apparent distress but abdominal pain on questioning.  Objective: Vital signs in last 24 hours: Temp:  [95.8 F (35.4 C)-98.9 F (37.2 C)] 98.3 F (36.8 C) (12/17 0400) Pulse Rate:  [105-124] 106  (12/17 0600) Resp:  [10-20] 18  (12/17 0600) BP: (91-131)/(55-72) 110/62 mmHg (12/17 0600) SpO2:  [96 %-100 %] 100 % (12/17 0600) Arterial Line BP: (84-154)/(56-75) 109/58 mmHg (12/17 0300) FiO2 (%):  [39.6 %-50.3 %] 40 % (12/17 0600) Weight:  [206 lb 2.1 oz (93.5 kg)-209 lb 3.5 oz (94.9 kg)] 209 lb 3.5 oz (94.9 kg) (12/17 0600)    Intake/Output from previous day: 12/16 0701 - 12/17 0700 In: 6685 [I.V.:5585; Blood:1050; IV Piggyback:50] Out: 2780 [Urine:1980; Stool:650; Blood:150] Intake/Output this shift:    General appearance: alert, cooperative and mild distress Resp: clear to auscultation bilaterally GI: normal findings: Soft, mild tendernesss, wound dressed.  Stoma functioning   Lab Results:   Basename 05/15/11 0525 05/14/11 1515  WBC 16.0* 11.9*  HGB 10.3* 11.0*  HCT 30.0* 31.7*  PLT 336 261   BMET  Basename 05/15/11 0525 05/14/11 1515  NA 141 137  K 4.3 3.8  CL 112 107  CO2 20 20  GLUCOSE 101* 105*  BUN 16 16  CREATININE 0.80 0.95  CALCIUM 7.7* 7.2*   PT/INR  Basename 05/14/11 0338  LABPROT 16.8*  INR 1.34   ABG  Basename 05/15/11 0500 05/14/11 1210  PHART 7.425* 7.308*  HCO3 18.5* 18.0*    Studies/Results: Ct Abdomen Pelvis W Contrast  05/14/2011  *RADIOLOGY REPORT*  Clinical Data: Recent surgery, abdominal pain  CT ABDOMEN AND PELVIS WITH CONTRAST  Technique:  Multidetector CT imaging of the abdomen and pelvis was performed following the standard protocol during bolus administration of intravenous contrast.  Contrast: OMNIPAQUE IOHEXOL 300 MG/ML IV SOLN  Comparison: 05/09/2011 CT  Findings: Heart size upper  normal limits to mildly enlarged. Bibasilar linear opacities.  Multiple hypodensities within the liver are unchanged. Unremarkable spleen, pancreas, adrenal glands.  Symmetric renal enhancement.  There are nonobstructing left renal stones. Nonobstructing lower pole right renal stones as well.  Too small further characterize bilateral renal hypodensities.  The gallbladder is distended.  No radiodense gallstones.  No biliary ductal dilatation.  Interval increased free intraperitoneal air and fluid.  Bowel anastomotic suture in the pelvis status post subtotal colectomy. Bowel anastomotic suture noted in the right mid abdomen.  There are thickened/inflamed appearing loops of small bowel near the anastomosis in the right mid abdomen.  Prominent lymph nodes within the right lower quadrant, measuring up to 1.3 cm short axis.  Small fluid pocket at the ileostomy takedown site within the subcutaneous fat superficial to the peritoneum is nonspecific.  Thrombus within the SMV.  The portal vein and splenic vein are patent.  The SMA, celiac axis, and IMA are patent.  Scattered atherosclerosis.  Thin-walled bladder.  Absent uterus.  No adnexal mass.  No acute osseous abnormality.  IMPRESSION: Interval perforation or anastamotic leak, with increased free intraperitoneal air and fluid.  Thickened loop of small bowel within the right lower abdomen. Given the SMV thrombus, this may represent ischemic changes.  Enlarged ileocolic lymph node measuring 1.3 cm short axis.  Given the adjacent inflammatory changes, may simply be reactive. However, if the patient underwent a colectomy for malignancy, metastatic involvement of this lymph node should also be  considered.  Critical Value/emergent results were called by telephone at the time of interpretation on 05/14/2011  at 06:40 a.m.  to the surgeon, Dr. Daphine Deutscher, who verbally acknowledged these results.  Original Report Authenticated By: Waneta Martins, M.D.   Dg Chest Port 1  View  05/15/2011  *RADIOLOGY REPORT*  Clinical Data: Ventilated patient, evaluate endotracheal tube position.  PORTABLE CHEST - 1 VIEW  Comparison: 05/14/2011  Findings: Endotracheal tube tip is positioned 3 cm proximal to the carina.  NG tube descends into the abdomen, tip not visualized. Right IJ central venous catheter tip projects over the distal SVC.  Otherwise stable chest, with perihilar fullness and interstitial prominence.  Retrocardiac opacity.  No pneumothorax.  IMPRESSION: Stable support devices including endotracheal tube and right IJ catheter.  Perihilar fullness and interstitial prominence is similar to prior, suggests edema.  Retrocardiac opacity is nonspecific; atelectasis versus infiltrate.  Original Report Authenticated By: Waneta Martins, M.D.   Dg Chest Port 1 View  05/14/2011  *RADIOLOGY REPORT*  Clinical Data: Evaluate ET tube placement and right IJ placement  PORTABLE CHEST - 1 VIEW  Comparison: 05/14/2011  Findings: There is a ET tube with tip above the carina.  There is a right IJ catheter with tip in the cavoatrial junction.  The nasogastric tube tip is in the stomach.  Heart size is normal. Since the earlier exam there has been increase in interstitial markings suggesting pulmonary edema.  Atelectasis is noted within both lung bases.  IMPRESSION:  1.  The ET tube and the right IJ catheter are in satisfactory position.  No pneumothorax identified. 2.  Suspect increase in pulmonary edema with bibasilar atelectasis.  Original Report Authenticated By: Rosealee Albee, M.D.   Dg Chest Port 1 View  05/14/2011  *RADIOLOGY REPORT*  Clinical Data: Shortness of breath  PORTABLE CHEST - 1 VIEW  Comparison: 02/23/2011  Findings: Heart size upper normal limits to mildly enlarged.  No focal consolidation.  No pleural effusion or pneumothorax.  No acute osseous abnormality.  IMPRESSION: Heart size upper normal limits to mildly enlarged without focal consolidation.  Original Report  Authenticated By: Waneta Martins, M.D.    Anti-infectives: Anti-infectives     Start     Dose/Rate Route Frequency Ordered Stop   05/14/11 1300   Ampicillin-Sulbactam (UNASYN) 3 g in sodium chloride 0.9 % 100 mL IVPB  Status:  Discontinued        3 g 100 mL/hr over 60 Minutes Intravenous Every 6 hours 05/14/11 1230 05/14/11 1510   05/14/11 1300   ertapenem (INVANZ) 1 g in sodium chloride 0.9 % 50 mL IVPB        1 g 100 mL/hr over 30 Minutes Intravenous Every 24 hours 05/14/11 1152     05/14/11 0530   Ampicillin-Sulbactam (UNASYN) 3 g in sodium chloride 0.9 % 100 mL IVPB        3 g 100 mL/hr over 60 Minutes Intravenous  Once 05/14/11 0521 05/14/11 0727          Assessment/Plan: s/p Procedure(s): EXPLORATORY LAPAROTOMY S/P ileostomy for SB anastamotic leak.  Appears stable/improved.  Likely rapid vent wean this AM-per CCM.  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  Can start IV heparin in about 24 hr   LOS: 1 day    Shir Bergman T 05/15/2011

## 2011-05-15 NOTE — Progress Notes (Signed)
Name: [name] MRN: [id number] DOB: May 17, 1951    LOS: 1  PCCM follow up  note  Req: Wenda Low, CCS History of Present Illness: 60/F presented with acute abdomen 12/16 & adm from OR s/p  Emergent Exploratory laparotomy, resection of small bowel anastomosis, end ileostomy through previously marked ostomy site on the left side for leaking small bowel anastomosis. 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   Lines / Drains: ETT 12/16 >>12/17 RIJ 12/16 >>  Cultures: Peritoneal fluid 12/16 >>  Antibiotics: unasyn preop  Ertapenem 12/16 >>  Tests / Events: 12/16 pressors weaned off  Subjective Looks comfortable and ready for extubation.   objective Vital Signs: Temp:  [95.8 F (35.4 C)-98.9 F (37.2 C)] 98.3 F (36.8 C) (12/17 0800) Pulse Rate:  [103-124] 109  (12/17 0800) Resp:  [10-20] 16  (12/17 0800) BP: (91-131)/(55-72) 113/68 mmHg (12/17 0800) SpO2:  [96 %-100 %] 98 % (12/17 0800) Arterial Line BP: (84-154)/(56-75) 109/58 mmHg (12/17 0300) FiO2 (%):  [29.9 %-50.3 %] 29.9 % (12/17 0800) Weight:  [93.5 kg (206 lb 2.1 oz)-94.9 kg (209 lb 3.5 oz)] 209 lb 3.5 oz (94.9 kg) (12/17 0600) I/O last 3 completed shifts: In: 8815 [I.V.:7715; Blood:1050; IV Piggyback:50] Out: 2780 [Urine:1980; Stool:650; Blood:150]  Physical Examination: General:  Chronically ill appearing Neuro:  Awake follows commands HENT:  No jvd, PEERL, orally intubated Cardiovascular:  s1s2 tachy, regular Lungs:  Decreased BL, no rhonchi, F//Vt <80, no accessory muscle use.  Abdomen:  Soft, distended,  Tender to palp, ostomy pink, passing gas Musculoskeletal:  No edema Skin:  Warm, no rash  Ventilator settings: Vent Mode:  [-] PSV;CPAP FiO2 (%):  [29.9 %-50.3 %] 29.9 % Set Rate:  [10 bmp] 10 bmp Vt Set:  [530 mL] 530 mL PEEP:  [5 cmH20] 5 cmH20 Pressure Support:  [5 cmH20] 5 cmH20 Plateau Pressure:  [17  cmH20-20 cmH20] 20 cmH20  Labs and Imaging:  Lab 05/15/11 0525 05/14/11 1515 05/14/11 0859 05/14/11 0830 05/14/11 0445  NA 141 137 138 -- --  K 4.3 3.8 2.7* -- --  CL 112 107 -- -- 103  CO2 20 20 -- -- 22  BUN 16 16 -- -- 15  CREATININE 0.80 0.95 -- -- 1.48*  GLUCOSE 101* 105* -- 99 --    Lab 05/15/11 0525 05/14/11 1515 05/14/11 0859 05/14/11 0445  HGB 10.3* 11.0* 8.8* --  HCT 30.0* 31.7* 26.0* --  WBC 16.0* 11.9* -- 3.4*  PLT 336 261 -- 276   PCXR: basilar volume loss, ett good position   Assessment and Plan: Acute resp failure -post op, now meets criteria for extubation.  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 Plan: -for heparin gtt when ok with surgery  Best practices / Disposition: -->ICU status  -->full code -->SCds for DVT Px, for heparin gtt.  -->Protonix for GI Px -->npo -->Husband updated at bedside  BABCOCK,PETE 05/15/2011, 9:41 AM  STAFF NOTE: I, Dr Lavinia Sharps have personally reviewed patient's available data, including medical history, events of note, physical examination and test results as part of my evaluation. I have discussed with NP and other care providers such as pharmacist,  RN and RRT.  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 We have extubated her today nevertheless overall situation is critically ill though improved.  Rest per NP whose note is outlined above and that I agree with  The patient is critically ill with multiple organ systems failure and requires high  complexity decision making for assessment and support, frequent evaluation and titration of therapies, application of advanced monitoring technologies and extensive interpretation of multiple databases.   Critical Care Time devoted to patient care services described in this note is  35 inutes.

## 2011-05-15 NOTE — Plan of Care (Signed)
Pt extubated after being examined by Anders Simmonds, NP. RT extubated patient- she is now on 2L [place] and is tolerating extubation very well. Her oxygen saturation remains at 100%. Patient is able to speak, has regular/ unlabored respirations, and complains of a sore throat post extubation.

## 2011-05-15 NOTE — Progress Notes (Signed)
Patient ID: [name], female   DOB: [birth year], 60 y.o.   MRN: [id number] [facility] Progress Note:   1 Day Post-Op  Subjective: Extubated and comfortable Objective: Vital signs in last 24 hours: Temp:  [95.8 F (35.4 C)-98.9 F (37.2 C)] 98.3 F (36.8 C) (12/17 0800) Pulse Rate:  [103-124] 109  (12/17 0800) Resp:  [10-20] 16  (12/17 0800) BP: (91-131)/(55-72) 113/68 mmHg (12/17 0800) SpO2:  [96 %-100 %] 98 % (12/17 0800) Arterial Line BP: (84-154)/(56-75) 109/58 mmHg (12/17 0300) FiO2 (%):  [29.9 %-50.3 %] 29.9 % (12/17 0800) Weight:  [206 lb 2.1 oz (93.5 kg)-209 lb 3.5 oz (94.9 kg)] 209 lb 3.5 oz (94.9 kg) (12/17 0600)  Intake/Output from previous day: 12/16 0701 - 12/17 0700 In: 6815 [I.V.:5715; Blood:1050; IV Piggyback:50] Out: 2780 [Urine:1980; Stool:650; Blood:150] Intake/Output this shift: Total I/O In: 260 [I.V.:260] Out: 225 [Urine:125; Stool:100]  Physical Exam:  Ostomy in place.  Pink with minimal enteric drainage, Lungs clear.  No increased work of breathing Lab Results:   Basename 05/15/11 0525 05/14/11 1515  WBC 16.0* 11.9*  HGB 10.3* 11.0*  HCT 30.0* 31.7*  PLT 336 261   BMET  Basename 05/15/11 0525 05/14/11 1515  NA 141 137  K 4.3 3.8  CL 112 107  CO2 20 20  GLUCOSE 101* 105*  BUN 16 16  CREATININE 0.80 0.95  CALCIUM 7.7* 7.2*   PT/INR  Basename 05/14/11 0338  LABPROT 16.8*  INR 1.34   Studies/Results: Ct Abdomen Pelvis W Contrast  05/14/2011  *RADIOLOGY REPORT*  Clinical Data: Recent surgery, abdominal pain  CT ABDOMEN AND PELVIS WITH CONTRAST  Technique:  Multidetector CT imaging of the abdomen and pelvis was performed following the standard protocol during bolus administration of intravenous contrast.  Contrast: OMNIPAQUE IOHEXOL 300 MG/ML IV SOLN  Comparison: 05/09/2011 CT  Findings: Heart size upper normal limits to mildly enlarged. Bibasilar linear opacities.  Multiple hypodensities within the liver are  unchanged. Unremarkable spleen, pancreas, adrenal glands.  Symmetric renal enhancement.  There are nonobstructing left renal stones. Nonobstructing lower pole right renal stones as well.  Too small further characterize bilateral renal hypodensities.  The gallbladder is distended.  No radiodense gallstones.  No biliary ductal dilatation.  Interval increased free intraperitoneal air and fluid.  Bowel anastomotic suture in the pelvis status post subtotal colectomy. Bowel anastomotic suture noted in the right mid abdomen.  There are thickened/inflamed appearing loops of small bowel near the anastomosis in the right mid abdomen.  Prominent lymph nodes within the right lower quadrant, measuring up to 1.3 cm short axis.  Small fluid pocket at the ileostomy takedown site within the subcutaneous fat superficial to the peritoneum is nonspecific.  Thrombus within the SMV.  The portal vein and splenic vein are patent.  The SMA, celiac axis, and IMA are patent.  Scattered atherosclerosis.  Thin-walled bladder.  Absent uterus.  No adnexal mass.  No acute osseous abnormality.  IMPRESSION: Interval perforation or anastamotic leak, with increased free intraperitoneal air and fluid.  Thickened loop of small bowel within the right lower abdomen. Given the SMV thrombus, this may represent ischemic changes.  Enlarged ileocolic lymph node measuring 1.3 cm short axis.  Given the adjacent inflammatory changes, may simply be reactive. However, if the patient underwent a colectomy for malignancy, metastatic involvement of this lymph node should also be considered.  Critical Value/emergent results were called by telephone at the time of interpretation on 05/14/2011  at 06:40 a.m.  to  the surgeon, Dr. Daphine Deutscher, who verbally acknowledged these results.  Original Report Authenticated By: Waneta Martins, M.D.   Dg Chest Port 1 View  05/15/2011  *RADIOLOGY REPORT*  Clinical Data: Ventilated patient, evaluate endotracheal tube position.   PORTABLE CHEST - 1 VIEW  Comparison: 05/14/2011  Findings: Endotracheal tube tip is positioned 3 cm proximal to the carina.  NG tube descends into the abdomen, tip not visualized. Right IJ central venous catheter tip projects over the distal SVC.  Otherwise stable chest, with perihilar fullness and interstitial prominence.  Retrocardiac opacity.  No pneumothorax.  IMPRESSION: Stable support devices including endotracheal tube and right IJ catheter.  Perihilar fullness and interstitial prominence is similar to prior, suggests edema.  Retrocardiac opacity is nonspecific; atelectasis versus infiltrate.  Original Report Authenticated By: Waneta Martins, M.D.   Dg Chest Port 1 View  05/14/2011  *RADIOLOGY REPORT*  Clinical Data: Evaluate ET tube placement and right IJ placement  PORTABLE CHEST - 1 VIEW  Comparison: 05/14/2011  Findings: There is a ET tube with tip above the carina.  There is a right IJ catheter with tip in the cavoatrial junction.  The nasogastric tube tip is in the stomach.  Heart size is normal. Since the earlier exam there has been increase in interstitial markings suggesting pulmonary edema.  Atelectasis is noted within both lung bases.  IMPRESSION:  1.  The ET tube and the right IJ catheter are in satisfactory position.  No pneumothorax identified. 2.  Suspect increase in pulmonary edema with bibasilar atelectasis.  Original Report Authenticated By: Rosealee Albee, M.D.   Dg Chest Port 1 View  05/14/2011  *RADIOLOGY REPORT*  Clinical Data: Shortness of breath  PORTABLE CHEST - 1 VIEW  Comparison: 02/23/2011  Findings: Heart size upper normal limits to mildly enlarged.  No focal consolidation.  No pleural effusion or pneumothorax.  No acute osseous abnormality.  IMPRESSION: Heart size upper normal limits to mildly enlarged without focal consolidation.  Original Report Authenticated By: Waneta Martins, M.D.   Anti-infectives: Anti-infectives     Start     Dose/Rate Route  Frequency Ordered Stop   05/14/11 1300   Ampicillin-Sulbactam (UNASYN) 3 g in sodium chloride 0.9 % 100 mL IVPB  Status:  Discontinued        3 g 100 mL/hr over 60 Minutes Intravenous Every 6 hours 05/14/11 1230 05/14/11 1510   05/14/11 1300   ertapenem (INVANZ) 1 g in sodium chloride 0.9 % 50 mL IVPB        1 g 100 mL/hr over 30 Minutes Intravenous Every 24 hours 05/14/11 1152     05/14/11 0530   Ampicillin-Sulbactam (UNASYN) 3 g in sodium chloride 0.9 % 100 mL IVPB        3 g 100 mL/hr over 60 Minutes Intravenous  Once 05/14/11 0521 05/14/11 0727          Assessment/Plan: Problem List: Patient Active Problem List  Diagnoses  . VIRAL INFECTION  . HYPOTHYROIDISM  . HYPERLIPIDEMIA  . DEPRESSION  . HYPERTENSION  . SINUSITIS, ACUTE  . ALLERGIC RHINITIS  . NEPHROLITHIASIS  . SHOULDER PAIN, RIGHT  . FATIGUE  . ABDOMINAL PAIN, EPIGASTRIC  . Perforation of small intestine at site of enteroenterostomy (05/02/11)  . 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  Have requested heparin drip per pharmacy for SMV thrombosis 1 Day Post-Op    LOS: 1 day   Matt B. Daphine Deutscher, MD, FACS  [facility] [phone] beeper [phone]  05/15/2011 9:50 AM

## 2011-05-16 DIAGNOSIS — R6521 Severe sepsis with septic shock: Secondary | ICD-10-CM

## 2011-05-16 DIAGNOSIS — A419 Sepsis, unspecified organism: Secondary | ICD-10-CM

## 2011-05-16 DIAGNOSIS — J9600 Acute respiratory failure, unspecified whether with hypoxia or hypercapnia: Secondary | ICD-10-CM

## 2011-05-16 DIAGNOSIS — K659 Peritonitis, unspecified: Secondary | ICD-10-CM

## 2011-05-16 LAB — CBC
HCT: 27.5 % — ABNORMAL LOW (ref 36.0–46.0)
Hemoglobin: 9.4 g/dL — ABNORMAL LOW (ref 12.0–15.0)
MCH: 29.7 pg (ref 26.0–34.0)
MCHC: 34.2 g/dL (ref 30.0–36.0)

## 2011-05-16 LAB — BASIC METABOLIC PANEL
BUN: 17 mg/dL (ref 6–23)
Chloride: 113 mEq/L — ABNORMAL HIGH (ref 96–112)
Glucose, Bld: 85 mg/dL (ref 70–99)
Potassium: 4.2 mEq/L (ref 3.5–5.1)

## 2011-05-16 NOTE — Progress Notes (Signed)
Heparin level at goal 0.44 with current rate.  No issues/bleeding per RN. Will continue with current rate and check heparin level daily. Luetta Nutting PharmD, BCPS  05/16/2011, 5:29 AM

## 2011-05-16 NOTE — Progress Notes (Signed)
Patient ID: [name], female   DOB: [birth year], 60 y.o.   MRN: [id number] [facility] Progress Note:   2 Days Post-Op  Subjective: Not wanting to move or get up much. Not much pain with PCA Objective: Vital signs in last 24 hours: Temp:  [98 F (36.7 C)-98.6 F (37 C)] 98.6 F (37 C) (12/18 0400) Pulse Rate:  [95-110] 97  (12/18 0630) Resp:  [16-24] 24  (12/18 0630) BP: (95-123)/(57-80) 123/80 mmHg (12/18 0630) SpO2:  [95 %-100 %] 96 % (12/18 0630) FiO2 (%):  [29.9 %-30 %] 29.9 % (12/17 0800) Weight:  [205 lb 0.4 oz (93 kg)] 205 lb 0.4 oz (93 kg) (12/18 0000)  Intake/Output from previous day: 12/17 0701 - 12/18 0700 In: 2100 [I.V.:2050; IV Piggyback:50] Out: 2155 [Urine:1305; Emesis/NG output:450; Stool:400] Intake/Output this shift:    Physical Exam:  Ostomy pink  With green enteral contents Lab Results:   Basename 05/16/11 0400 05/15/11 0525  WBC 18.0* 16.0*  HGB 9.4* 10.3*  HCT 27.5* 30.0*  PLT 332 336   BMET  Basename 05/16/11 0400 05/15/11 0525  NA 141 141  K 4.2 4.3  CL 113* 112  CO2 22 20  GLUCOSE 85 101*  BUN 17 16  CREATININE 0.68 0.80  CALCIUM 8.4 7.7*   PT/INR  Basename 05/14/11 0338  LABPROT 16.8*  INR 1.34   Studies/Results: Dg Chest Port 1 View  05/15/2011  *RADIOLOGY REPORT*  Clinical Data: Ventilated patient, evaluate endotracheal tube position.  PORTABLE CHEST - 1 VIEW  Comparison: 05/14/2011  Findings: Endotracheal tube tip is positioned 3 cm proximal to the carina.  NG tube descends into the abdomen, tip not visualized. Right IJ central venous catheter tip projects over the distal SVC.  Otherwise stable chest, with perihilar fullness and interstitial prominence.  Retrocardiac opacity.  No pneumothorax.  IMPRESSION: Stable support devices including endotracheal tube and right IJ catheter.  Perihilar fullness and interstitial prominence is similar to prior, suggests edema.  Retrocardiac opacity is nonspecific; atelectasis  versus infiltrate.  Original Report Authenticated By: Waneta Martins, M.D.   Dg Chest Port 1 View  05/14/2011  *RADIOLOGY REPORT*  Clinical Data: Evaluate ET tube placement and right IJ placement  PORTABLE CHEST - 1 VIEW  Comparison: 05/14/2011  Findings: There is a ET tube with tip above the carina.  There is a right IJ catheter with tip in the cavoatrial junction.  The nasogastric tube tip is in the stomach.  Heart size is normal. Since the earlier exam there has been increase in interstitial markings suggesting pulmonary edema.  Atelectasis is noted within both lung bases.  IMPRESSION:  1.  The ET tube and the right IJ catheter are in satisfactory position.  No pneumothorax identified. 2.  Suspect increase in pulmonary edema with bibasilar atelectasis.  Original Report Authenticated By: Rosealee Albee, M.D.   Dg Chest Port 1v Same Day  05/15/2011  *RADIOLOGY REPORT*  Clinical Data: Follow-up bowel surgery.  Endotracheal tube removed.  PORTABLE CHEST - 1 VIEW SAME DAY  Comparison: Portable film earlier in the day  Findings: Endotracheal tube has been removed.  Mild atelectasis left base is stable.  Nasogastric tube good position.  Central venous catheter unchanged SVC.  Mild vascular congestion.  No pneumothorax.  IMPRESSION: Satisfactory appearance following endotracheal tube removal.  Mild left basilar atelectasis unchanged.  Original Report Authenticated By: Elsie Stain, M.D.   Anti-infectives: Anti-infectives     Start     Dose/Rate Route Frequency  Ordered Stop   05/14/11 1300   Ampicillin-Sulbactam (UNASYN) 3 g in sodium chloride 0.9 % 100 mL IVPB  Status:  Discontinued        3 g 100 mL/hr over 60 Minutes Intravenous Every 6 hours 05/14/11 1230 05/14/11 1510   05/14/11 1300   ertapenem (INVANZ) 1 g in sodium chloride 0.9 % 50 mL IVPB        1 g 100 mL/hr over 30 Minutes Intravenous Every 24 hours 05/14/11 1152     05/14/11 0530   Ampicillin-Sulbactam (UNASYN) 3 g in sodium  chloride 0.9 % 100 mL IVPB        3 g 100 mL/hr over 60 Minutes Intravenous  Once 05/14/11 0521 05/14/11 0727          Assessment/Plan: Problem List: Patient Active Problem List  Diagnoses  . VIRAL INFECTION  . HYPOTHYROIDISM  . HYPERLIPIDEMIA  . DEPRESSION  . HYPERTENSION  . SINUSITIS, ACUTE  . ALLERGIC RHINITIS  . NEPHROLITHIASIS  . SHOULDER PAIN, RIGHT  . FATIGUE  . ABDOMINAL PAIN, EPIGASTRIC  . Perforation of small intestine at site of enteroenterostomy (05/02/11)  . 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  D/C NG and Foley and offer ice chips.  Move to chair.  2 Days Post-Op    LOS: 2 days   Matt B. Daphine Deutscher, MD, [facility] [phone] beeper [phone]  05/16/2011 7:52 AM

## 2011-05-16 NOTE — Progress Notes (Signed)
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   Lines / Drains: ETT 12/16 >>12/17 RIJ 12/16 >>  Cultures: Peritoneal fluid 12/16 >>  Antibiotics: unasyn preop  Ertapenem 12/16 >>  Tests / Events: 12/16 pressors weaned off  Subjective Looks comfortable and ready for extubation.   objective Vital Signs: Temp:  [97.7 F (36.5 C)-98.6 F (37 C)] 97.7 F (36.5 C) (12/18 1200) Pulse Rate:  [94-107] 96  (12/18 1200) Resp:  [16-25] 16  (12/18 1204) BP: (96-130)/(59-80) 130/74 mmHg (12/18 1200) SpO2:  [94 %-100 %] 96 % (12/18 1204) Weight:  [93 kg (205 lb 0.4 oz)] 205 lb 0.4 oz (93 kg) (12/18 0000) I/O last 3 completed shifts: In: 3410 [I.V.:3360; IV Piggyback:50] Out: 3200 [Urine:2150; Emesis/NG output:450; Stool:600]  Physical Examination: General:  Chronically ill appearing Neuro:  Awake follows commands HENT:  No jvd, PEERL Cardiovascular:  s1s2 tachy, regular Lungs:  Decreased BL, no rhonchi, Abdomen:  Soft, distended,  Tender to palp, ostomy pink, passing gas Musculoskeletal:  No edema Skin:  Warm, no rash  Ventilator settings:    Labs and Imaging:   Lab 05/16/11 0400 05/15/11 0525 05/14/11 1515  NA 141 141 137  K 4.2 4.3 3.8  CL 113* 112 107  CO2 22 20 20   BUN 17 16 16   CREATININE 0.68 0.80 0.95  GLUCOSE 85 101* 105*    Lab 05/16/11 0400 05/15/11 0525 05/14/11 1515  HGB 9.4* 10.3* 11.0*  HCT 27.5* 30.0* 31.7*  WBC 18.0* 16.0* 11.9*  PLT 332 336 261   PCXR: basilar volume loss,   Assessment and  Plan: Acute resp failure (resolved)  Abdominal peritonitis with resultant septic shock - shock resolved Plan: -continue Invanz for peritonitis -Await cx data, note was on cipro 12/10 onwards for UTI  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 Plan: -for heparin gtt when ok with surgery  Best practices / Disposition: -->sdu status -->PCCM sign off. Call PRN.  -->full code -->SCds for DVT Px, for heparin gtt.  -->Protonix for GI Px -->npo -->Husband updated at bedside  BABCOCK,PETE 05/16/2011, 2:08 PM  STAFF NOTE: I, Dr Lavinia Sharps have personally reviewed patient's available data, including medical history, events of note, physical examination and test results as part of my evaluation. I have discussed with NP and other care providers such as pharmacist, RN and RRT.  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 We have extubated her yesterday. Resp status is stable. PCCM will sign off today. Rest per NP wose note is outlined above and that I agree with

## 2011-05-17 LAB — HEPARIN LEVEL (UNFRACTIONATED)
Heparin Unfractionated: 0.33 IU/mL (ref 0.30–0.70)
Heparin Unfractionated: 0.47 IU/mL (ref 0.30–0.70)

## 2011-05-17 LAB — CBC
HCT: 27.5 % — ABNORMAL LOW (ref 36.0–46.0)
Hemoglobin: 8.9 g/dL — ABNORMAL LOW (ref 12.0–15.0)
MCH: 28.7 pg (ref 26.0–34.0)
MCHC: 32.4 g/dL (ref 30.0–36.0)

## 2011-05-17 MED ORDER — SERTRALINE HCL 100 MG PO TABS
100.0000 mg | ORAL_TABLET | Freq: Every day | ORAL | Status: DC
  Administered 2011-05-17 – 2011-05-25 (×9): 100 mg via ORAL
  Filled 2011-05-17 (×9): qty 1

## 2011-05-17 MED ORDER — ALBUTEROL SULFATE HFA 108 (90 BASE) MCG/ACT IN AERS
2.0000 | INHALATION_SPRAY | Freq: Four times a day (QID) | RESPIRATORY_TRACT | Status: DC | PRN
  Filled 2011-05-17: qty 6.7

## 2011-05-17 NOTE — Progress Notes (Signed)
Patient ID: [name], female   DOB: Dec 06, 1950, 60 y.o.   MRN: [id number]  General Surgery - [facility] - Progress Note  POD# 3  Subjective: Pt up to chair.  Took short walk today.  Pain improved.  Husband at bedside  Objective: Vital signs in last 24 hours: Temp:  [97.7 F (36.5 C)-99.4 F (37.4 C)] 98.4 F (36.9 C) (12/19 0800) Pulse Rate:  [90-100] 90  (12/19 0800) Resp:  [11-25] 15  (12/19 0800) BP: (115-130)/(64-74) 115/69 mmHg (12/19 0800) SpO2:  [88 %-98 %] 98 % (12/19 0800) Weight:  [203 lb 7.8 oz (92.3 kg)] 203 lb 7.8 oz (92.3 kg) (12/19 0300)    Intake/Output from previous day: 12/18 0701 - 12/19 0700 In: 2180.5 [I.V.:2180.5] Out: 1325 [Urine:1200; Stool:125]  Exam: HEENT - clear, not icteric Neck - soft, no nodules, well healed Kocher incision Chest - clear bilaterally Cor - RRR, no murmur Abd - mild distension, small gas at stoma, dressings dry, rare BS Ext - no significant edema Neuro - grossly intact, no focal deficits  Lab Results:   Basename 05/17/11 0455 05/16/11 0400  WBC 19.5* 18.0*  HGB 8.9* 9.4*  HCT 27.5* 27.5*  PLT 351 332     Basename 05/16/11 0400 05/15/11 0525  NA 141 141  K 4.2 4.3  CL 113* 112  CO2 22 20  GLUCOSE 85 101*  BUN 17 16  CREATININE 0.68 0.80  CALCIUM 8.4 7.7*    Studies/Results: No results found.  Assessment: Stable post op ileostomy  Plan: Continue NPO, IVF OOB to chair, ambulate with assistance Continue IV Invanz Will defer transfer to floor orders to Drs. Daphine Deutscher & Hoxworth Will follow  Velora Heckler, MD, FACS General & Endocrine Surgery [facility]  05/17/2011

## 2011-05-17 NOTE — Progress Notes (Signed)
Patient ID: [name], female   DOB: 12/19/1950, 60 y.o.   MRN: [id number]  Pt feels better, very stable.  Still with low grade fever and elevated WBC.  OK for transfer to floor.  Mariella Saa MD, FACS  05/17/2011, 2:54 PM

## 2011-05-17 NOTE — Progress Notes (Signed)
ANTICOAGULATION CONSULT NOTE - Follow Up Consult  Pharmacy Consult for Heparin Indication: thrombosis of superior mesenteric vein.  Allergies  Allergen Reactions  . Modafinil     REACTION: rash    Patient Measurements: Height: 5\' 7"  (170.2 cm) Weight: 203 lb 7.8 oz (92.3 kg) IBW/kg (Calculated) : 61.6  Heparin dosing weight 82.4kg  Vital Signs: Temp: 98.4 F (36.9 C) (12/19 0800) Temp src: Oral (12/19 0800) BP: 115/69 mmHg (12/19 0800) Pulse Rate: 90  (12/19 0800)  Labs:  Basename 05/17/11 0920 05/17/11 0455 05/16/11 0400 05/15/11 0525 05/14/11 1515  HGB -- 8.9* 9.4* -- --  HCT -- 27.5* 27.5* 30.0* --  PLT -- 351 332 336 --  APTT -- -- -- -- --  LABPROT -- -- -- -- --  INR -- -- -- -- --  HEPARINUNFRC 0.47 0.33 0.44 -- --  CREATININE -- -- 0.68 0.80 0.95  CKTOTAL -- -- -- -- --  CKMB -- -- -- -- --  TROPONINI -- -- -- -- --   Estimated Creatinine Clearance: 89.4 ml/min (by C-G formula based on Cr of 0.68).  Medical History: Past Medical History  Diagnosis Date  . HYPOTHYROIDISM 10/06/2008  . HYPERLIPIDEMIA 10/06/2008  . DEPRESSION 02/23/2010  . HYPERTENSION 10/06/2008  . DIVERTICULITIS, ACUTE 11/10/2008  . ALLERGIC RHINITIS 07/23/2009  . Diverticulosis of colon 11/10/2008    Medications:  Infusions:     . 0.9 % NaCl with KCl 40 mEq / L 75 mL/hr (05/17/11 0852)  . heparin 1,400 Units/hr (05/16/11 2333)    Assessment: 60 yo F POD #3 s/p bowel resection, anastomosis, end ileostomy on 12/16 CT 12/16 shows Thrombus within the SMV.  Heparin level drawn with AM labs obtained from central line where heparin is running.  Second heparin level obtained from another location. Heparin level is therapeutic at 0.47  Goal of Therapy:  Heparin level 0.3-0.7 units/ml   Plan:  Continue heparin infusion at 1400 units/hr (14 ml/hr) IV. Follow daily heparin levels and CBC.  Lynann Beaver PharmD  Pager [phone] 05/17/2011 11:29 AM

## 2011-05-17 NOTE — Progress Notes (Signed)
Report given to receiving RN on % west; pt transferred via wheelchair to rm [id number].  [name]

## 2011-05-18 LAB — TYPE AND SCREEN
ABO/RH(D): A POS
Antibody Screen: NEGATIVE
Unit division: 0
Unit division: 0

## 2011-05-18 LAB — CBC
HCT: 28.1 % — ABNORMAL LOW (ref 36.0–46.0)
MCHC: 32.7 g/dL (ref 30.0–36.0)
RDW: 16.2 % — ABNORMAL HIGH (ref 11.5–15.5)

## 2011-05-18 LAB — HEPARIN LEVEL (UNFRACTIONATED): Heparin Unfractionated: <0.1 IU/mL — ABNORMAL LOW (ref 0.30–0.70)

## 2011-05-18 MED ORDER — HEPARIN BOLUS VIA INFUSION
2500.0000 [IU] | Freq: Once | INTRAVENOUS | Status: AC
  Administered 2011-05-18: 2500 [IU] via INTRAVENOUS
  Filled 2011-05-18: qty 2500

## 2011-05-18 MED ORDER — LEVOTHYROXINE SODIUM 137 MCG PO TABS: 137.0000 ug | ORAL_TABLET | Freq: Every day | ORAL | Status: DC

## 2011-05-18 MED ORDER — LOSARTAN POTASSIUM 50 MG PO TABS
50.0000 mg | ORAL_TABLET | Freq: Every day | ORAL | Status: DC
  Administered 2011-05-18 – 2011-05-25 (×8): 50 mg via ORAL
  Filled 2011-05-18 (×8): qty 1

## 2011-05-18 MED ORDER — ZOLPIDEM TARTRATE 10 MG PO TABS
10.0000 mg | ORAL_TABLET | Freq: Every evening | ORAL | Status: DC | PRN
  Administered 2011-05-18 – 2011-05-24 (×7): 10 mg via ORAL
  Filled 2011-05-18 (×7): qty 1

## 2011-05-18 MED ORDER — LEVOTHYROXINE SODIUM 137 MCG PO TABS
137.0000 ug | ORAL_TABLET | Freq: Every day | ORAL | Status: DC
  Administered 2011-05-18 – 2011-05-25 (×8): 137 ug via ORAL
  Filled 2011-05-18 (×8): qty 1

## 2011-05-18 MED ORDER — LEVOTHYROXINE SODIUM 137 MCG PO TABS
137.0000 ug | ORAL_TABLET | Freq: Every day | ORAL | Status: DC
Start: 2011-05-19 — End: 2011-05-18
  Filled 2011-05-18: qty 1

## 2011-05-18 NOTE — Consult Note (Signed)
WOC ostomy consult  Stoma type/location: left lower quadrant ileostomy Stomal assessment/size: 1 and 3/4 inches round, not fully matured stoma.  (Some sloughing bowel lining evident) Peristomal assessment: intact.  Erythema evident from 2-6 o'clock. Minor depression noted at 3 o'clock Treatment options for stomal/peristomal skin: None indicated at this time.  Pouching system modification made to include a barrier ring placed circumferentially. Output: Thin, brown Ostomy pouching: 1 pc pouching system [place] # [id number]) with barrier ring [place] # [phone]). Supplies left in room. Education provided: Patient and husband allowed to vent their frustration about stoma creation being necessary.  Hopeful that this stoma will be easy to manage and that pouching systems will be able to adhere for several days.  Taught that parastomal edema will subside and that definitive products/routine cannot be determined at this point in time. Our team will follow with you. Ladona Mow, MSN, RN, GNP, CWOCN [phone])

## 2011-05-18 NOTE — Progress Notes (Signed)
Pt requesting ambien for assist sleeping. Noted pt. NPO at this time except ICE chips, was restarted on zoloft today to be first PO med given, but no ambien ordered at this time. MD on call notifed, and that pt was on ambien to assist sleep on previous admission, I also questioned if OK to give PO meds at this time. MD acknowlegded, ok to give PO meds with sips, and to ask rounding MD if can get order for ambien in am. Pt made aware, and continue to moniter.

## 2011-05-18 NOTE — Progress Notes (Signed)
ANTICOAGULATION CONSULT NOTE - Follow Up Consult  Pharmacy Consult for Heparin Indication: Thrombosis of superior mesenteric vein  Allergies  Allergen Reactions  . Modafinil     REACTION: rash    Patient Measurements: Height: 5\' 7"  (170.2 cm) Weight: 195 lb 12.3 oz (88.8 kg) IBW/kg (Calculated) : 61.6  Adjusted Body Weight: 82.4kg  Vital Signs: Temp: 99 F (37.2 C) (12/20 0445) Temp src: Oral (12/20 0445) BP: 136/88 mmHg (12/20 0445) Pulse Rate: 89  (12/20 0445)  Labs:  Basename 05/18/11 0340 05/17/11 0920 05/17/11 0455 05/16/11 0400  HGB 9.2* -- 8.9* --  HCT 28.1* -- 27.5* 27.5*  PLT 411* -- 351 332  APTT -- -- -- --  LABPROT -- -- -- --  INR -- -- -- --  HEPARINUNFRC 0.13* 0.47 0.33 --  CREATININE -- -- -- 0.68  CKTOTAL -- -- -- --  CKMB -- -- -- --  TROPONINI -- -- -- --   Estimated Creatinine Clearance: 87.7 ml/min (by C-G formula based on Cr of 0.68).   Medications:  Scheduled:    . ertapenem  1 g Intravenous Q24H  . levothyroxine  137 mcg Oral QAC breakfast  . losartan  50 mg Oral Daily  . morphine   Intravenous Q4H  . pantoprazole (PROTONIX) IV  40 mg Intravenous QHS  . sertraline  100 mg Oral Daily  . DISCONTD: levothyroxine  50 mcg Intravenous Daily    Assessment:  60 yo female POD #4 s/p bowel resection, anastomosis, end ileostomy  CT 12/16 shows thrombus within SMV  Heparin level this am subtherapeutic on 1400 units/hr  Levels the past several days have been therapeutic on stable rate   Goal of Therapy:  Heparin level 0.3-0.7 units/ml   Plan:   Increase rate 1900 units/hr  Recheck level in 6 hrs  Rollene Fare 05/18/2011,10:01 AM Pager: [phone]

## 2011-05-18 NOTE — Progress Notes (Signed)
ANTICOAGULATION CONSULT NOTE - Follow Up Consult  Pharmacy Consult for Heparin Indication: Thrombosis of superior mesenteric vein  Allergies  Allergen Reactions  . Modafinil     REACTION: rash    Patient Measurements: Height: 5\' 7"  (170.2 cm) Weight: 195 lb 12.3 oz (88.8 kg) IBW/kg (Calculated) : 61.6  Adjusted Body Weight: 82.4kg  Vital Signs: Temp: 98.6 F (37 C) (12/20 1400) Temp src: Oral (12/20 1400) BP: 136/85 mmHg (12/20 1400) Pulse Rate: 84  (12/20 1400)  Labs:  Basename 05/18/11 1730 05/18/11 0340 05/17/11 0920 05/17/11 0455 05/16/11 0400  HGB -- 9.2* -- 8.9* --  HCT -- 28.1* -- 27.5* 27.5*  PLT -- 411* -- 351 332  APTT -- -- -- -- --  LABPROT -- -- -- -- --  INR -- -- -- -- --  HEPARINUNFRC <0.10* 0.13* 0.47 -- --  CREATININE -- -- -- -- 0.68  CKTOTAL -- -- -- -- --  CKMB -- -- -- -- --  TROPONINI -- -- -- -- --   Estimated Creatinine Clearance: 87.7 ml/min (by C-G formula based on Cr of 0.68).   Medications:  Scheduled:     . ertapenem  1 g Intravenous Q24H  . levothyroxine  137 mcg Oral QAC breakfast  . losartan  50 mg Oral Daily  . morphine   Intravenous Q4H  . pantoprazole (PROTONIX) IV  40 mg Intravenous QHS  . sertraline  100 mg Oral Daily  . DISCONTD: levothyroxine  50 mcg Intravenous Daily  . DISCONTD: levothyroxine  137 mcg Oral QAC breakfast  . DISCONTD: levothyroxine  137 mcg Oral QAC breakfast    Assessment:  60 yo female POD #4 s/p bowel resection, anastomosis, end ileostomy  CT 12/16 shows thrombus within SMV  Heparin level subtherapeutic/ undetectable despite increased dose earlier today  Levels the past several days have been therapeutic on stable rate  Per the RN there have been no interruptions or IV site issues and the rate has been at 1900 units/hr   Goal of Therapy:  Heparin level 0.3-0.7 units/ml   Plan:   Heparin bolus 2500 units  Increase Heparin rate to 2300 units/hr  Recheck level in 6  hrs   Lynann Beaver PharmD  Pager [phone] 05/18/2011 6:48 PM

## 2011-05-18 NOTE — Progress Notes (Signed)
Pt originally scheduled to take Zoloft 100mg  tablet at 10:00, pt states "it makes her sleepy and she normally takes it at night". RN consulted with Pharmacy and time was changed to 22:00. Marcelino Duster, RN

## 2011-05-18 NOTE — Progress Notes (Signed)
Patient ID: [name], female   DOB: [birth year], 60 y.o.   MRN: [id number] 4 Days Post-Op  Subjective: Feels pretty well, slept well. No nausea  Objective: Vital signs in last 24 hours: Temp:  [97.7 F (36.5 C)-99.9 F (37.7 C)] 99 F (37.2 C) (12/20 0445) Pulse Rate:  [88-94] 89  (12/20 0445) Resp:  [16-22] 16  (12/20 0821) BP: (115-138)/(63-88) 136/88 mmHg (12/20 0445) SpO2:  [94 %-98 %] 96 % (12/20 0445) Weight:  [195 lb 12.3 oz (88.8 kg)] 195 lb 12.3 oz (88.8 kg) (12/20 0618) Last BM Date: 05/17/11  Intake/Output from previous day: 12/19 0701 - 12/20 0700 In: 2021 [I.V.:2021] Out: 2750 [Urine:2575; Stool:175] Intake/Output this shift:    General appearance: alert and no distress Resp: clear to auscultation bilaterally GI: normal findings: soft, non-tender Incision/Wound:Packed, no erythema or drainage  Lab Results:   [facility] 05/18/11 0340 05/17/11 0455  WBC 14.2* 19.5*  HGB 9.2* 8.9*  HCT 28.1* 27.5*  PLT 411* 351   BMET  Basename 05/16/11 0400  NA 141  K 4.2  CL 113*  CO2 22  GLUCOSE 85  BUN 17  CREATININE 0.68  CALCIUM 8.4   PT/INR No results found for this basename: LABPROT:2,INR:2 in the last 72 hours ABG No results found for this basename: PHART:2,PCO2:2,PO2:2,HCO3:2 in the last 72 hours  Studies/Results: No results found.  Anti-infectives: Anti-infectives     Start     Dose/Rate Route Frequency Ordered Stop   05/14/11 1300   Ampicillin-Sulbactam (UNASYN) 3 g in sodium chloride 0.9 % 100 mL IVPB  Status:  Discontinued        3 g 100 mL/hr over 60 Minutes Intravenous Every 6 hours 05/14/11 1230 05/14/11 1510   05/14/11 1300   ertapenem (INVANZ) 1 g in sodium chloride 0.9 % 50 mL IVPB        1 g 100 mL/hr over 30 Minutes Intravenous Every 24 hours 05/14/11 1152     05/14/11 0530   Ampicillin-Sulbactam (UNASYN) 3 g in sodium chloride 0.9 % 100 mL IVPB        3 g 100 mL/hr over 60 Minutes Intravenous  Once 05/14/11 0521 05/14/11 0727           Assessment/Plan: s/p Procedure(s): EXPLORATORY LAPAROTOMY Doing well, improving signs of infection Advance diet.  Cont abx and wound care  LOS: 4 days    Rogue Pautler T 05/18/2011

## 2011-05-19 LAB — DIFFERENTIAL
Basophils Absolute: 0.1 10*3/uL (ref 0.0–0.1)
Eosinophils Absolute: 0.5 10*3/uL (ref 0.0–0.7)
Lymphocytes Relative: 19 % (ref 12–46)
Lymphs Abs: 2.1 10*3/uL (ref 0.7–4.0)
Neutro Abs: 7.1 10*3/uL (ref 1.7–7.7)

## 2011-05-19 LAB — CBC
HCT: 29.6 % — ABNORMAL LOW (ref 36.0–46.0)
MCHC: 33.1 g/dL (ref 30.0–36.0)
RDW: 15.7 % — ABNORMAL HIGH (ref 11.5–15.5)

## 2011-05-19 MED ORDER — HEPARIN SOD (PORCINE) IN D5W 100 UNIT/ML IV SOLN
1400.0000 [IU]/h | INTRAVENOUS | Status: DC
  Administered 2011-05-20: 1200 [IU]/h via INTRAVENOUS
  Filled 2011-05-19 (×3): qty 250

## 2011-05-19 MED ORDER — HEPARIN SOD (PORCINE) IN D5W 100 UNIT/ML IV SOLN
1800.0000 [IU]/h | INTRAVENOUS | Status: DC
  Administered 2011-05-19: 1800 [IU]/h via INTRAVENOUS
  Filled 2011-05-19 (×4): qty 250

## 2011-05-19 NOTE — Progress Notes (Signed)
05/18/11, Kathi Der RNC-MNN, BSN, [phone], CM received referral.  CM spoke to pt. and offered choice.  Pt states that she is currently active with [facility] and would like to continue their services.  Chip Boer at [facility] notified of [facility] RN needs upon d/c for possible wound care/medication monitoring per order received.  Will follow.

## 2011-05-19 NOTE — Progress Notes (Signed)
ANTICOAGULATION CONSULT NOTE - Follow Up Consult  Pharmacy Consult for Heparin Indication: Thrombosis of superior mesenteric vein  Allergies  Allergen Reactions  . Modafinil     REACTION: rash    Patient Measurements: Height: 5\' 7"  (170.2 cm) Weight: 196 lb 13.9 oz (89.3 kg) IBW/kg (Calculated) : 61.6    Vital Signs: Temp: 97.6 F (36.4 C) (12/21 1000) Temp src: Oral (12/21 1000) BP: 132/86 mmHg (12/21 1000) Pulse Rate: 83  (12/21 1000)  Labs:  Basename 05/19/11 1216 05/19/11 0245 05/18/11 1730 05/18/11 0340 05/17/11 0455  HGB -- 9.8* -- 9.2* --  HCT -- 29.6* -- 28.1* 27.5*  PLT -- 443* -- 411* 351  APTT -- -- -- -- --  LABPROT -- -- -- -- --  INR -- -- -- -- --  HEPARINUNFRC 1.11* 1.52* <0.10* -- --  CREATININE -- -- -- -- --  CKTOTAL -- -- -- -- --  CKMB -- -- -- -- --  TROPONINI -- -- -- -- --   Estimated Creatinine Clearance: 88 ml/min (by C-G formula based on Cr of 0.68).   Medications:  Scheduled:     . ertapenem  1 g Intravenous Q24H  . heparin  2,500 Units Intravenous Once  . levothyroxine  137 mcg Oral QAC breakfast  . losartan  50 mg Oral Daily  . morphine   Intravenous Q4H  . pantoprazole (PROTONIX) IV  40 mg Intravenous QHS  . sertraline  100 mg Oral Daily   Infusions:     . 0.9 % NaCl with KCl 40 mEq / L 75 mL/hr at 05/19/11 0438  . heparin 1,800 Units/hr (05/19/11 1038)  . DISCONTD: heparin 2,300 Units/hr (05/18/11 2200)    Assessment: -60 yo female POD #5 s/p bowel resection, anastomosis, end ileostomy -CT 12/16 showed thrombus within SMV -Heparin level improved, but still supratherapeutic despite rate decrease earlier today.    Goal of Therapy:  Heparin level 0.3-0.7 units/ml   Plan:  -Will reduce heparin to 1400 units/hr, which provided the most consistently therapeutic levels previously -Recheck heparin level at [employer], Pharm.D.  [phone] 05/19/2011 1:41 PM

## 2011-05-19 NOTE — Progress Notes (Signed)
ANTICOAGULATION CONSULT NOTE - Follow Up Consult  Pharmacy Consult for Heparin Indication: Thrombosis of superior mesenteric vein  Heparin level = 0.73 on 1400 units/hr  Assessment:  Heparin level slightly above goal (0.3-0.7), but improved from previously supratherapeutic values  Issues with blood draws reported by RN noted  Goal of Therapy:  Heparin level 0.3-0.7 units/ml   Plan:   Reduce heparin 1200 units/hr & recheck level in am  If no further invasive procedures planned, suggest changing to lovenox 90mg  sq q12h to eliminate need for checking heparin levels.  Rollene Fare, 05/19/2011, 8:42 PM Pager: [phone]

## 2011-05-19 NOTE — Progress Notes (Signed)
Upon assessment, pt very tearful, c/o severe pain with blood draws for heparin level blood draws due to drawn 4 times today in left hand and unable to get blood drawn anywhere else in BUE. Pt left hand noted bruised and swollen,unsuccessful blood draw attempts made to L AC, IV heparin running to Right hand. Discussed with patient need for blood draw and possible alternatives. Pt states understanding and agreeable, to call MD for any suggestions. MD on call notified. Orders received and initiated. Continue to [employer].

## 2011-05-19 NOTE — Progress Notes (Signed)
ANTICOAGULATION CONSULT NOTE - Follow Up Consult  Pharmacy Consult for Heparin Indication: Thrombosis of superior mesenteric vein  Allergies  Allergen Reactions  . Modafinil     REACTION: rash    Patient Measurements: Height: 5\' 7"  (170.2 cm) Weight: 195 lb 12.3 oz (88.8 kg) IBW/kg (Calculated) : 61.6    Vital Signs: Temp: 97.8 F (36.6 C) (12/21 0230) Temp src: Oral (12/21 0230) BP: 126/85 mmHg (12/21 0230) Pulse Rate: 86  (12/21 0230)  Labs:  Basename 05/19/11 0245 05/18/11 1730 05/18/11 0340 05/17/11 0455  HGB 9.8* -- 9.2* --  HCT 29.6* -- 28.1* 27.5*  PLT 443* -- 411* 351  APTT -- -- -- --  LABPROT -- -- -- --  INR -- -- -- --  HEPARINUNFRC 1.52* <0.10* 0.13* --  CREATININE -- -- -- --  CKTOTAL -- -- -- --  CKMB -- -- -- --  TROPONINI -- -- -- --   Estimated Creatinine Clearance: 87.7 ml/min (by C-G formula based on Cr of 0.68).   Medications:  Scheduled:    . ertapenem  1 g Intravenous Q24H  . heparin  2,500 Units Intravenous Once  . levothyroxine  137 mcg Oral QAC breakfast  . losartan  50 mg Oral Daily  . morphine   Intravenous Q4H  . pantoprazole (PROTONIX) IV  40 mg Intravenous QHS  . sertraline  100 mg Oral Daily  . DISCONTD: levothyroxine  50 mcg Intravenous Daily  . DISCONTD: levothyroxine  137 mcg Oral QAC breakfast  . DISCONTD: levothyroxine  137 mcg Oral QAC breakfast   Infusions:    . 0.9 % NaCl with KCl 40 mEq / L 75 mL/hr at 05/18/11 1037  . heparin 2,300 Units/hr (05/18/11 2200)    Assessment: 60 yo female POD #4 s/p bowel resection, anastomosis, end ileostomy CT 12/16 shows thrombus within SMV HL now supratherapeutic (1.52) after rebolus and rate increase. RN to hold heparin x 1 hr. Then Restart @ 1800 units/hr (18 ml/hr) Recheck in 6 hours  Goal of Therapy:  Heparin level 0.3-0.7 units/ml   Plan:  RN to hold heparin x 1 hr. Then Restart @ 1800 units/hr (18 ml/hr). Recheck in 6 hours.   Susanne Greenhouse  R 05/19/2011,4:23 AM

## 2011-05-19 NOTE — Consult Note (Signed)
WOC ostomy follow-up consult  Stoma type/location:  Ileostomy pouch changed yesterday.  Intact with good seal. Stomal assessment/size: Stoma red and viable, pouch not removed at this time. Output  Mod brown liquid stool in pouch Ostomy pouching: 1pc flexible with barrier ring. Education provided:  Pt and husband familiar with pouching routines r/t previous ostomy surgery.  Pt states she feels comfortable with pouch application,  Routines, and ordering supplies.  Aware of dietary precautions since she had previous ileostomy.  Supplies in room for patient use.  Independent for emptying activities if discharged.   Cammie Mcgee, RN, MSN, [employer]  [phone]

## 2011-05-19 NOTE — Progress Notes (Signed)
Patient ID: [name], female   DOB: [birth year], 60 y.o.   MRN: [id number] 5 Days Post-Op  Subjective: Comfortable. Feels better daily. Tires easily. Tolerating clear liquids without difficulty. Ostomy is functioning and no difficulty with leakage.  Objective: Vital signs in last 24 hours: Temp:  [97.6 F (36.4 C)-98.7 F (37.1 C)] 97.6 F (36.4 C) (12/21 1000) Pulse Rate:  [83-92] 83  (12/21 1000) Resp:  [16-20] 18  (12/21 1032) BP: (126-143)/(83-93) 132/86 mmHg (12/21 1000) SpO2:  [92 %-97 %] 92 % (12/21 1032) Weight:  [196 lb 13.9 oz (89.3 kg)] 196 lb 13.9 oz (89.3 kg) (12/21 0600) Last BM Date: 05/18/11 (dark green, thick liquid)  Intake/Output from previous day: 12/20 0701 - 12/21 0700 In: 1663.2 [I.V.:1663.2] Out: 3950 [Urine:3550; Stool:400] Intake/Output this shift: Total I/O In: -  Out: 1250 [Urine:1250]  General appearance: alert and no distress GI: normal findings: soft, non-tender Incision/Wound:wound is packed, very clean yesterday. Stoma healthy and functioning.  Lab Results:   Basename 05/19/11 0245 05/18/11 0340  WBC 10.9* 14.2*  HGB 9.8* 9.2*  HCT 29.6* 28.1*  PLT 443* 411*   BMET No results found for this basename: NA:2,K:2,CL:2,CO2:2,GLUCOSE:2,BUN:2,CREATININE:2,CALCIUM:2 in the last 72 hours PT/INR No results found for this basename: LABPROT:2,INR:2 in the last 72 hours ABG No results found for this basename: PHART:2,PCO2:2,PO2:2,HCO3:2 in the last 72 hours  Studies/Results: No results found.  Anti-infectives: Anti-infectives     Start     Dose/Rate Route Frequency Ordered Stop   05/14/11 1300   Ampicillin-Sulbactam (UNASYN) 3 g in sodium chloride 0.9 % 100 mL IVPB  Status:  Discontinued        3 g 100 mL/hr over 60 Minutes Intravenous Every 6 hours 05/14/11 1230 05/14/11 1510   05/14/11 1300   ertapenem (INVANZ) 1 g in sodium chloride 0.9 % 50 mL IVPB        1 g 100 mL/hr over 30 Minutes Intravenous Every 24 hours 05/14/11 1152       05/14/11 0530   Ampicillin-Sulbactam (UNASYN) 3 g in sodium chloride 0.9 % 100 mL IVPB        3 g 100 mL/hr over 60 Minutes Intravenous  Once 05/14/11 0521 05/14/11 0727          Assessment/Plan: s/p Procedure(s): EXPLORATORY LAPAROTOMY Doing well. Signs of infection are resolving. Continue IV antibiotics for now. Will advance diet.   LOS: 5 days    Kimmerly Lora T 05/19/2011

## 2011-05-20 MED ORDER — PANTOPRAZOLE SODIUM 40 MG PO TBEC
40.0000 mg | DELAYED_RELEASE_TABLET | Freq: Every day | ORAL | Status: DC
  Administered 2011-05-20 – 2011-05-24 (×5): 40 mg via ORAL
  Filled 2011-05-20 (×7): qty 1

## 2011-05-20 MED ORDER — COUMADIN BOOK
Freq: Once | Status: AC
  Administered 2011-05-20: 15:00:00
  Filled 2011-05-20: qty 1

## 2011-05-20 MED ORDER — WARFARIN VIDEO
Freq: Once | Status: AC
  Administered 2011-05-21: 14:00:00

## 2011-05-20 MED ORDER — OXYCODONE-ACETAMINOPHEN 5-325 MG PO TABS
1.0000 | ORAL_TABLET | ORAL | Status: DC | PRN
  Administered 2011-05-20 – 2011-05-23 (×8): 1 via ORAL
  Filled 2011-05-20 (×11): qty 1

## 2011-05-20 MED ORDER — ENOXAPARIN SODIUM 100 MG/ML ~~LOC~~ SOLN
90.0000 mg | Freq: Two times a day (BID) | SUBCUTANEOUS | Status: DC
  Administered 2011-05-20 – 2011-05-21 (×3): 90 mg via SUBCUTANEOUS
  Administered 2011-05-22: 02:00:00 via SUBCUTANEOUS
  Filled 2011-05-20 (×7): qty 1

## 2011-05-20 MED ORDER — MORPHINE SULFATE 4 MG/ML IJ SOLN
4.0000 mg | INTRAMUSCULAR | Status: DC | PRN
  Administered 2011-05-21 (×3): 4 mg via INTRAVENOUS
  Filled 2011-05-20 (×3): qty 1

## 2011-05-20 NOTE — Progress Notes (Signed)
ANTICOAGULATION CONSULT NOTE - Follow Up Consult  Pharmacy Consult for D/C Heparin gtt, Start Lovenox and coumadin Indication: thrombosis superior mesenteric vein  Allergies  Allergen Reactions  . Modafinil     REACTION: rash    Patient Measurements: Height: 5\' 7"  (170.2 cm) Weight: 192 lb 0.3 oz (87.1 kg) IBW/kg (Calculated) : 61.6    Vital Signs: Temp: 98.3 F (36.8 C) (12/22 1010) Temp src: Oral (12/22 1010) BP: 138/84 mmHg (12/22 1010) Pulse Rate: 82  (12/22 1010)  Labs:  Basename 05/20/11 0450 05/19/11 1947 05/19/11 1216 05/19/11 0245 05/18/11 0340  HGB -- -- -- 9.8* 9.2*  HCT -- -- -- 29.6* 28.1*  PLT -- -- -- 443* 411*  APTT -- -- -- -- --  LABPROT -- -- -- -- --  INR -- -- -- -- --  HEPARINUNFRC 0.28* 0.73* 1.11* -- --  CREATININE -- -- -- -- --  CKTOTAL -- -- -- -- --  CKMB -- -- -- -- --  TROPONINI -- -- -- -- --   Estimated Creatinine Clearance: 86.9 ml/min (by C-G formula based on Cr of 0.68).   Medications:  Scheduled:    . enoxaparin (LOVENOX) injection  90 mg Subcutaneous Q12H  . ertapenem  1 g Intravenous Q24H  . levothyroxine  137 mcg Oral QAC breakfast  . losartan  50 mg Oral Daily  . pantoprazole  40 mg Oral Q1200  . sertraline  100 mg Oral Daily  . DISCONTD: morphine   Intravenous Q4H  . 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 Now to d/c heparin and change to lovenox and coumadin per pharmacy. Today is day 1 of minimum 5 day overlap therapy.  Goal of Therapy:  INR 2-3    Plan:  D/C Heparin gtt, Start Lovenox 90mg  sq q12h. Coumadin 7.5mg  tonight. D/C heparin levels. Continue daily CBC. Start Daily PT/INR. Coumadin education book and video. Counseling prior to d/c.  Gwen Her PharmD  [phone] 05/20/2011 1:56 PM

## 2011-05-20 NOTE — Progress Notes (Signed)
ANTICOAGULATION CONSULT NOTE - Follow Up Consult  Pharmacy Consult for Heparin Indication: thrombosis superior mesenteric vein  Allergies  Allergen Reactions  . Modafinil     REACTION: rash    Patient Measurements: Height: 5\' 7"  (170.2 cm) Weight: 192 lb 0.3 oz (87.1 kg) IBW/kg (Calculated) : 61.6    Vital Signs: Temp: 98 F (36.7 C) (12/22 0500) Temp src: Oral (12/22 0500) BP: 127/83 mmHg (12/22 0500) Pulse Rate: 84  (12/22 0500)  Labs:  Basename 05/20/11 0450 05/19/11 1947 05/19/11 1216 05/19/11 0245 05/18/11 0340  HGB -- -- -- 9.8* 9.2*  HCT -- -- -- 29.6* 28.1*  PLT -- -- -- 443* 411*  APTT -- -- -- -- --  LABPROT -- -- -- -- --  INR -- -- -- -- --  HEPARINUNFRC 0.28* 0.73* 1.11* -- --  CREATININE -- -- -- -- --  CKTOTAL -- -- -- -- --  CKMB -- -- -- -- --  TROPONINI -- -- -- -- --   Estimated Creatinine Clearance: 86.9 ml/min (by C-G formula based on Cr of 0.68).   Medications:  Scheduled:    . ertapenem  1 g Intravenous Q24H  . levothyroxine  137 mcg Oral QAC breakfast  . losartan  50 mg Oral Daily  . morphine   Intravenous Q4H  . pantoprazole (PROTONIX) IV  40 mg Intravenous QHS  . sertraline  100 mg Oral Daily   Infusions:    . 0.9 % NaCl with KCl 40 mEq / L 50 mL/hr at 05/19/11 1831  . heparin 1,200 Units/hr (05/20/11 0602)  . DISCONTD: heparin 1,800 Units/hr (05/19/11 1038)   PRN: albuterol, diphenhydrAMINE, diphenhydrAMINE, naloxone, ondansetron (ZOFRAN) IV, sodium chloride, zolpidem  Assessment: 59 yo F on Heparin 1200 units/hr for superior mesenteric thrombosis. Heparin level low. H/H and pltc stable. No bleeding reported.   Goal of Therapy:  Heparin level 0.3-0.7 units/ml   Plan:  Increase Heparin rate to 1400 units/hour. Recheck heparin level in 6 hours (1400).  Gwen Her PharmD  [phone] 05/20/2011 7:54 AM

## 2011-05-20 NOTE — Progress Notes (Signed)
6 Days Post-Op  Subjective: Tolerating diet, no nausea.  Biggest complaint is the IV draws for heparin levels.  Objective: Vital signs in last 24 hours: Temp:  [97.9 F (36.6 C)-98.4 F (36.9 C)] 98.3 F (36.8 C) (12/22 1010) Pulse Rate:  [17-90] 82  (12/22 1010) Resp:  [18-20] 18  (12/22 1010) BP: (123-154)/(80-84) 138/84 mmHg (12/22 1010) SpO2:  [93 %-99 %] 98 % (12/22 1010) Weight:  [192 lb 0.3 oz (87.1 kg)] 192 lb 0.3 oz (87.1 kg) (12/22 0500) Last BM Date: 05/19/11  Intake/Output from previous day: 12/21 0701 - 12/22 0700 In: 2543.3 [P.O.:720; I.V.:1823.3] Out: 3726 [Urine:3450; Stool:276] Intake/Output this shift: Total I/O In: 240 [P.O.:240] Out: 1400 [Urine:1400]  General appearance: alert, cooperative and no distress GI: soft, appropriate tenderness, wound without infection, midline wound with packing, nd, ostomy functioning well.  Lab Results:   Basename 05/19/11 0245 05/18/11 0340  WBC 10.9* 14.2*  HGB 9.8* 9.2*  HCT 29.6* 28.1*  PLT 443* 411*   BMET No results found for this basename: NA:2,K:2,CL:2,CO2:2,GLUCOSE:2,BUN:2,CREATININE:2,CALCIUM:2 in the last 72 hours PT/INR No results found for this basename: LABPROT:2,INR:2 in the last 72 hours ABG No results found for this basename: PHART:2,PCO2:2,PO2:2,HCO3:2 in the last 72 hours  Studies/Results: No results found.  Anti-infectives: Anti-infectives     Start     Dose/Rate Route Frequency Ordered Stop   05/14/11 1300   Ampicillin-Sulbactam (UNASYN) 3 g in sodium chloride 0.9 % 100 mL IVPB  Status:  Discontinued        3 g 100 mL/hr over 60 Minutes Intravenous Every 6 hours 05/14/11 1230 05/14/11 1510   05/14/11 1300   ertapenem (INVANZ) 1 g in sodium chloride 0.9 % 50 mL IVPB        1 g 100 mL/hr over 30 Minutes Intravenous Every 24 hours 05/14/11 1152     05/14/11 0530   Ampicillin-Sulbactam (UNASYN) 3 g in sodium chloride 0.9 % 100 mL IVPB        3 g 100 mL/hr over 60 Minutes Intravenous  Once  05/14/11 0521 05/14/11 0727          Assessment/Plan: s/p Procedure(s): EXPLORATORY LAPAROTOMY seems to be doing okay from abdominal standpoint.  Will change to therapeutic lovenox instead of heparin and will start oral anticoagulation.    LOS: 6 days    [name] [name] 05/20/2011

## 2011-05-20 NOTE — Progress Notes (Signed)
Pharmacy: IV to PO Protonix  Patient meets P and T committee criteria for auto converstion of IV protonix to PO: tolerating diet of CL or better, no GIB, tolerating other PO meds.  Gwen Her PharmD  [phone] 05/20/2011 12:30 PM

## 2011-05-21 LAB — BASIC METABOLIC PANEL
BUN: 4 mg/dL — ABNORMAL LOW (ref 6–23)
Calcium: 9.4 mg/dL (ref 8.4–10.5)
GFR calc Af Amer: >90 mL/min (ref >90)
GFR calc non Af Amer: >90 mL/min (ref >90)
Potassium: 3.4 mEq/L — ABNORMAL LOW (ref 3.5–5.1)
Sodium: 141 mEq/L (ref 135–145)

## 2011-05-21 LAB — CBC
HCT: 31.2 % — ABNORMAL LOW (ref 36.0–46.0)
MCH: 28.9 pg (ref 26.0–34.0)
MCHC: 32.7 g/dL (ref 30.0–36.0)
RDW: 15.7 % — ABNORMAL HIGH (ref 11.5–15.5)

## 2011-05-21 MED ORDER — ADULT MULTIVITAMIN W/MINERALS CH
1.0000 | ORAL_TABLET | Freq: Every day | ORAL | Status: DC
  Administered 2011-05-21 – 2011-05-25 (×5): 1 via ORAL
  Filled 2011-05-21 (×5): qty 1

## 2011-05-21 MED ORDER — WARFARIN SODIUM 7.5 MG PO TABS
7.5000 mg | ORAL_TABLET | Freq: Once | ORAL | Status: DC
  Filled 2011-05-21: qty 1

## 2011-05-21 MED ORDER — WARFARIN SODIUM 7.5 MG PO TABS
7.5000 mg | ORAL_TABLET | Freq: Once | ORAL | Status: AC
  Administered 2011-05-21: 7.5 mg via ORAL
  Filled 2011-05-21: qty 1

## 2011-05-21 MED ORDER — POTASSIUM CHLORIDE CRYS ER 20 MEQ PO TBCR
20.0000 meq | EXTENDED_RELEASE_TABLET | Freq: Once | ORAL | Status: AC
  Administered 2011-05-21: 20 meq via ORAL
  Filled 2011-05-21: qty 1

## 2011-05-21 NOTE — Progress Notes (Addendum)
ANTICOAGULATION CONSULT NOTE - Follow Up Consult  Pharmacy Consult for Lovenox/Coumadin Indication: thrombosis superior mesenteric vein   Allergies  Allergen Reactions  . Modafinil     REACTION: rash    Patient Measurements: Height: 5\' 7"  (170.2 cm) Weight: 184 lb 4.9 oz (83.6 kg) IBW/kg (Calculated) : 61.6    Vital Signs: Temp: 98.4 F (36.9 C) (12/23 0500) Temp src: Oral (12/23 0500) BP: 136/81 mmHg (12/23 0500) Pulse Rate: 82  (12/23 0500)  Labs:  Basename 05/21/11 0455 05/20/11 0450 05/19/11 1947 05/19/11 1216 05/19/11 0245  HGB 10.2* -- -- -- 9.8*  HCT 31.2* -- -- -- 29.6*  PLT 558* -- -- -- 443*  APTT -- -- -- -- --  LABPROT 14.9 -- -- -- --  INR 1.15 -- -- -- --  HEPARINUNFRC -- 0.28* 0.73* 1.11* --  CREATININE 0.73 -- -- -- --  CKTOTAL -- -- -- -- --  CKMB -- -- -- -- --  TROPONINI -- -- -- -- --   Estimated Creatinine Clearance: 85.2 ml/min (by C-G formula based on Cr of 0.73).   Medications:  Scheduled:    . coumadin book   Does not apply Once  . enoxaparin (LOVENOX) injection  90 mg Subcutaneous Q12H  . ertapenem  1 g Intravenous Q24H  . levothyroxine  137 mcg Oral QAC breakfast  . losartan  50 mg Oral Daily  . pantoprazole  40 mg Oral Q1200  . sertraline  100 mg Oral Daily  . warfarin   Does not apply Once  . DISCONTD: morphine   Intravenous Q4H  . DISCONTD: pantoprazole (PROTONIX) IV  40 mg Intravenous QHS    Assessment: 60 yo F on Lovenox 80mg  sq q12 for superior mesenteric thrombosis (changed from heparin infusion yesterday). Also Coumadin Day 2 of minimum 5 day overlap therapy. Coumadin 7.5mg  yesterday. No bleeding reported.  Goal of Therapy:  INR 2-3   Plan:  Repeat Coumadin 7.5mg  today. Continue LMWH for at least 5 day overlap or 2 day overlap w/tx INR, whichever is longer. F/U AM INR.  Gwen Her 05/21/2011,10:12 AM   Addendum: After further review of the chart, it appears order for coumadin dose yesterday was not  entered. Will count today as Day one of minium 5 day over lap. Will give coumadin dose now.   Gwen Her PharmD  [phone] 05/21/2011 10:20 AM

## 2011-05-21 NOTE — Progress Notes (Signed)
7 Days Post-Op  Subjective: Feeling well, no issues today   Objective: Vital signs in last 24 hours: Temp:  [98.1 F (36.7 C)-98.6 F (37 C)] 98.4 F (36.9 C) (12/23 0500) Pulse Rate:  [73-86] 82  (12/23 0500) Resp:  [18-20] 18  (12/23 0500) BP: (128-149)/(81-87) 136/81 mmHg (12/23 0500) SpO2:  [92 %-96 %] 96 % (12/23 0500) Weight:  [184 lb 4.9 oz (83.6 kg)] 184 lb 4.9 oz (83.6 kg) (12/23 0500) Last BM Date: 05/20/11  Intake/Output from previous day: 12/22 0701 - 12/23 0700 In: 779.5 [P.O.:240; I.V.:539.5] Out: 2550 [Urine:1400; Stool:1150] Intake/Output this shift:    General appearance: alert, cooperative and no distress GI: wound looks clean, no cellulitis, ostomy working well  Lab Results:   Basename 05/21/11 0455 05/19/11 0245  WBC 10.3 10.9*  HGB 10.2* 9.8*  HCT 31.2* 29.6*  PLT 558* 443*   BMET  Basename 05/21/11 0455  NA 141  K 3.4*  CL 102  CO2 30  GLUCOSE 94  BUN 4*  CREATININE 0.73  CALCIUM 9.4   PT/INR  Basename 05/21/11 0455  LABPROT 14.9  INR 1.15   ABG No results found for this basename: PHART:2,PCO2:2,PO2:2,HCO3:2 in the last 72 hours  Studies/Results: No results found.  Anti-infectives: Anti-infectives     Start     Dose/Rate Route Frequency Ordered Stop   05/14/11 1300   Ampicillin-Sulbactam (UNASYN) 3 g in sodium chloride 0.9 % 100 mL IVPB  Status:  Discontinued        3 g 100 mL/hr over 60 Minutes Intravenous Every 6 hours 05/14/11 1230 05/14/11 1510   05/14/11 1300   ertapenem (INVANZ) 1 g in sodium chloride 0.9 % 50 mL IVPB        1 g 100 mL/hr over 30 Minutes Intravenous Every 24 hours 05/14/11 1152     05/14/11 0530   Ampicillin-Sulbactam (UNASYN) 3 g in sodium chloride 0.9 % 100 mL IVPB        3 g 100 mL/hr over 60 Minutes Intravenous  Once 05/14/11 0521 05/14/11 0727          Assessment/Plan: s/p Procedure(s): EXPLORATORY LAPAROTOMY waiting for INR to be therapeutic, otherwise may be ready for discharge any  day on home Lovenox with outpatient follow up of INR  LOS: 7 days    [name] [name] 05/21/2011

## 2011-05-22 LAB — PROTIME-INR: Prothrombin Time: 17.3 seconds — ABNORMAL HIGH (ref 11.6–15.2)

## 2011-05-22 MED ORDER — ENOXAPARIN SODIUM 80 MG/0.8ML ~~LOC~~ SOLN
80.0000 mg | Freq: Two times a day (BID) | SUBCUTANEOUS | Status: DC
  Administered 2011-05-22 – 2011-05-23 (×2): 80 mg via SUBCUTANEOUS
  Filled 2011-05-22 (×4): qty 0.8

## 2011-05-22 MED ORDER — WARFARIN SODIUM 7.5 MG PO TABS
7.5000 mg | ORAL_TABLET | Freq: Once | ORAL | Status: AC
  Administered 2011-05-22: 7.5 mg via ORAL
  Filled 2011-05-22: qty 1

## 2011-05-22 NOTE — Consult Note (Signed)
WOC ostomy consult  Stoma type/location: Ileostomy Stomal assessment/size: 11/2inches, red and viable, above skin level, deep crease at 3 oclock and 9 oclock Output  50 cc liquid brown-green stool. Ostomy pouching: 1pc with barrier ring Education provided:  Assisted with pouch change.  Pt very familiar with pouching routines, application, and emptying.  Reviewed ordering supplies and pouch application.  Denies further questions regarding ostomy care.  States she feels able to perform independently. n Cammie Mcgee, RN, MSN, [employer]  [phone]

## 2011-05-22 NOTE — Progress Notes (Signed)
Patient ID: [name], female   DOB: 18-Feb-1951, 60 y.o.   MRN: [id number] 8 Days Post-Op  Subjective: No complaints except sore. Tolerating her diet well. Ileostomy functioning and no leakage or difficulty the appliance.  Objective: Vital signs in last 24 hours: Temp:  [97.9 F (36.6 C)-98.6 F (37 C)] 98.6 F (37 C) (12/24 0500) Pulse Rate:  [77-85] 85  (12/24 0500) Resp:  [18] 18  (12/24 0500) BP: (131-152)/(73-88) 136/84 mmHg (12/24 0500) SpO2:  [90 %-95 %] 93 % (12/24 0500) Weight:  [180 lb 5.4 oz (81.8 kg)] 180 lb 5.4 oz (81.8 kg) (12/24 0700) Last BM Date: 05/21/11  Intake/Output from previous day: 12/23 0701 - 12/24 0700 In: 960 [P.O.:960] Out: 1950 [Urine:1350; Stool:600] Intake/Output this shift:    General appearance: alert and no distress GI: normal findings: soft, non-tender Incision/Wound:packed and cleaned. Ileostomy looks fine.  Lab Results:   [facility] 05/21/11 0455  WBC 10.3  HGB 10.2*  HCT 31.2*  PLT 558*   BMET  Basename 05/21/11 0455  NA 141  K 3.4*  CL 102  CO2 30  GLUCOSE 94  BUN 4*  CREATININE 0.73  CALCIUM 9.4   PT/INR  Basename 05/22/11 0433 05/21/11 0455  LABPROT 17.3* 14.9  INR 1.39 1.15   ABG No results found for this basename: PHART:2,PCO2:2,PO2:2,HCO3:2 in the last 72 hours  Studies/Results: No results found.  Anti-infectives: Anti-infectives     Start     Dose/Rate Route Frequency Ordered Stop   05/14/11 1300   Ampicillin-Sulbactam (UNASYN) 3 g in sodium chloride 0.9 % 100 mL IVPB  Status:  Discontinued        3 g 100 mL/hr over 60 Minutes Intravenous Every 6 hours 05/14/11 1230 05/14/11 1510   05/14/11 1300   ertapenem (INVANZ) 1 g in sodium chloride 0.9 % 50 mL IVPB        1 g 100 mL/hr over 30 Minutes Intravenous Every 24 hours 05/14/11 1152     05/14/11 0530   Ampicillin-Sulbactam (UNASYN) 3 g in sodium chloride 0.9 % 100 mL IVPB        3 g 100 mL/hr over 60 Minutes Intravenous  Once 05/14/11 0521  05/14/11 0727          Assessment/Plan: s/p Procedure(s): EXPLORATORY LAPAROTOMY Patient is doing well. She has been afebrile with a normal white count for several days and I'm going to stop her IV antibiotics. She is on full dose Lovenox for SMV thrombosis. Coumadin has been started in the pharmacy is following. We will plan discharge later this week when her INR is therapeutic.   LOS: 8 days    [name] T 05/22/2011

## 2011-05-22 NOTE — Progress Notes (Signed)
ANTICOAGULATION CONSULT NOTE - Follow Up Consult  Pharmacy Consult for Lovenox/Coumadin Indication: thrombosis superior mesenteric vein   Allergies  Allergen Reactions  . Modafinil     REACTION: rash    Patient Measurements: Height: 5\' 7"  (170.2 cm) Weight: 180 lb 5.4 oz (81.8 kg) IBW/kg (Calculated) : 61.6   Vital Signs: Temp: 98.6 F (37 C) (12/24 0500) Temp src: Oral (12/24 0500) BP: 136/84 mmHg (12/24 0500) Pulse Rate: 85  (12/24 0500)  Labs:  Basename 05/22/11 0433 05/21/11 0455 05/20/11 0450 05/19/11 1947 05/19/11 1216  HGB -- 10.2* -- -- --  HCT -- 31.2* -- -- --  PLT -- 558* -- -- --  APTT -- -- -- -- --  LABPROT 17.3* 14.9 -- -- --  INR 1.39 1.15 -- -- --  HEPARINUNFRC -- -- 0.28* 0.73* 1.11*  CREATININE -- 0.73 -- -- --  CKTOTAL -- -- -- -- --  CKMB -- -- -- -- --  TROPONINI -- -- -- -- --   Estimated Creatinine Clearance: 84.3 ml/min (by C-G formula based on Cr of 0.73).   Medications:  Scheduled:     . enoxaparin (LOVENOX) injection  90 mg Subcutaneous Q12H  . levothyroxine  137 mcg Oral QAC breakfast  . losartan  50 mg Oral Daily  . mulitivitamin with minerals  1 tablet Oral Daily  . pantoprazole  40 mg Oral Q1200  . potassium chloride  20 mEq Oral Once  . sertraline  100 mg Oral Daily  . warfarin  7.5 mg Oral Once  . warfarin   Does not apply Once  . DISCONTD: ertapenem  1 g Intravenous Q24H  . DISCONTD: warfarin  7.5 mg Oral ONCE-1800    Assessment:  60 YOF on D2 of minimum 5-day Lovenox/Coumadin overlap therapy for superior mesenteric thrombosis  So far pt only received 1 dose of Coumadin 7.5 mg (12/22 dose missed)  There is a significant difference in patient's weight on admission and this AM.  RN contacted and wt obtained again this AM.  Pt is 78.2 kg instead of ~90 kg as recorded on admission.    INR subtherapeutic, as expected s/p 1st dose  No bleeding/complications noted.  Plan discharge later this week when INR  therapeutic  Goal of Therapy:  INR 2-3   Plan:   Change Lovenox to 80 mg sq q12h (1mg /kg q12h) given recent difference in wt entry  Repeat Coumadin 7.5 mg po x 1 tonight, anticipate less coumadin requirement in the future.  Geoffry Paradise Thi 05/22/2011,7:59 AM

## 2011-05-22 NOTE — Progress Notes (Signed)
Report given by day rn Alda Berthold, RN.  Verbally reported md gave order to leave saline lock for now.  Saline lock intact and dsg cdi; no sign of reddness noted.  Pt refuses iv restart and verbalizes understanding of risk of leaving iv intact.

## 2011-05-23 MED ORDER — WARFARIN 0.5 MG HALF TABLET
0.5000 mg | ORAL_TABLET | Freq: Once | ORAL | Status: AC
  Administered 2011-05-23: 0.5 mg via ORAL
  Filled 2011-05-23: qty 1

## 2011-05-23 MED ORDER — OXYCODONE-ACETAMINOPHEN 5-325 MG PO TABS
1.0000 | ORAL_TABLET | ORAL | Status: DC | PRN
  Administered 2011-05-23 – 2011-05-24 (×5): 1 via ORAL
  Filled 2011-05-23 (×4): qty 1

## 2011-05-23 NOTE — Progress Notes (Signed)
ANTICOAGULATION CONSULT NOTE - Follow Up Consult  Pharmacy Consult for Lovenox/Coumadin Indication: thrombosis superior mesenteric vein   Allergies  Allergen Reactions  . Modafinil     REACTION: rash    Patient Measurements: Height: 5\' 7"  (170.2 cm) Weight: 175 lb 11.3 oz (79.7 kg) IBW/kg (Calculated) : 61.6   Vital Signs: Temp: 97.9 F (36.6 C) (12/25 0527) Temp src: Oral (12/25 0527) BP: 132/83 mmHg (12/25 0527) Pulse Rate: 80  (12/25 0527)  Labs:  Basename 05/23/11 0515 05/22/11 0433 05/21/11 0455  HGB -- -- 10.2*  HCT -- -- 31.2*  PLT -- -- 558*  APTT -- -- --  LABPROT 30.0* 17.3* 14.9  INR 2.81* 1.39 1.15  HEPARINUNFRC -- -- --  CREATININE -- -- 0.73  CKTOTAL -- -- --  CKMB -- -- --  TROPONINI -- -- --   Estimated Creatinine Clearance: 83.3 ml/min (by C-G formula based on Cr of 0.73).   Medications:  Scheduled:     . enoxaparin (LOVENOX) injection  80 mg Subcutaneous Q12H  . levothyroxine  137 mcg Oral QAC breakfast  . losartan  50 mg Oral Daily  . mulitivitamin with minerals  1 tablet Oral Daily  . pantoprazole  40 mg Oral Q1200  . sertraline  100 mg Oral Daily  . warfarin  7.5 mg Oral ONCE-1800  . DISCONTD: enoxaparin (LOVENOX) injection  90 mg Subcutaneous Q12H    Assessment:  60 YOF on D3 of minimum 5-day Lovenox/Coumadin overlap therapy for superior mesenteric thrombosis  Patient has received 2 doses of Coumadin 7.5 mg (12/22 dose missed)  There is a significant difference in patient's weight on admission and this AM.  RN contacted and wt obtained   Pt is 78.2 kg instead of ~90 kg as recorded on admission.    INR greatly increased-poor po intake, INR therapeutic this am,   No bleeding/complications noted.  Will require 5 days overlap, Lovenox dose could be changed to 120mg  q24hr (1.5mg /kg/24hr) to finish course of overlap to facilitate discharge.  Goal of Therapy:  INR 2-3   Plan:   Lovenox at 80 mg sq q12h (1mg /kg q12h) given  recent difference in wt entry,could change to 120mg  sq q24 hr  Coumadin 0.5mg  only today with large increase in INR, hesitate to hold completely (INR can change disproportionately when Coumadin held)  Day 3/5 overlap today.  Daily protimes.    Otho Bellows PharmD Pager [phone] 05/23/2011,8:31 AM

## 2011-05-23 NOTE — Progress Notes (Signed)
9 Days Post-Op  Subjective: No complaints.  Objective: Vital signs in last 24 hours: Temp:  [97.8 F (36.6 C)-98.2 F (36.8 C)] 97.9 F (36.6 C) (12/25 0527) Pulse Rate:  [68-80] 80  (12/25 0527) Resp:  [18-20] 20  (12/25 0527) BP: (128-147)/(75-83) 132/83 mmHg (12/25 0527) SpO2:  [94 %-95 %] 94 % (12/25 0527) Weight:  [175 lb 11.3 oz (79.7 kg)] 175 lb 11.3 oz (79.7 kg) (12/25 0527) Last BM Date: 05/23/11  Intake/Output from previous day: 12/24 0701 - 12/25 0700 In: -  Out: 2775 [Urine:1900; Stool:875] Intake/Output this shift: Total I/O In: -  Out: 450 [Urine:350; Stool:100]  PE: Abd-soft, open areas of wound clean, ileostomy functioning in LUQ  Lab Results:   [facility] 05/21/11 0455  WBC 10.3  HGB 10.2*  HCT 31.2*  PLT 558*   BMET  Basename 05/21/11 0455  NA 141  K 3.4*  CL 102  CO2 30  GLUCOSE 94  BUN 4*  CREATININE 0.73  CALCIUM 9.4   PT/INR  Basename 05/23/11 0515 05/22/11 0433  LABPROT 30.0* 17.3*  INR 2.81* 1.39   Comprehensive Metabolic Panel:    Component Value Date/Time   NA 141 05/21/2011 0455   K 3.4* 05/21/2011 0455   CL 102 05/21/2011 0455   CO2 30 05/21/2011 0455   BUN 4* 05/21/2011 0455   CREATININE 0.73 05/21/2011 0455   GLUCOSE 94 05/21/2011 0455   CALCIUM 9.4 05/21/2011 0455   CALCIUM 10.2 04/02/2011 0440   AST 33 05/14/2011 1515   ALT 24 05/14/2011 1515   ALKPHOS 39 05/14/2011 1515   BILITOT 1.0 05/14/2011 1515   PROT 4.6* 05/14/2011 1515   ALBUMIN 1.8* 05/14/2011 1515     Studies/Results: No results found.  Anti-infectives: Anti-infectives     Start     Dose/Rate Route Frequency Ordered Stop   05/14/11 1300   Ampicillin-Sulbactam (UNASYN) 3 g in sodium chloride 0.9 % 100 mL IVPB  Status:  Discontinued        3 g 100 mL/hr over 60 Minutes Intravenous Every 6 hours 05/14/11 1230 05/14/11 1510   05/14/11 1300   ertapenem (INVANZ) 1 g in sodium chloride 0.9 % 50 mL IVPB  Status:  Discontinued        1 g 100 mL/hr  over 30 Minutes Intravenous Every 24 hours 05/14/11 1152 05/22/11 0738   05/14/11 0530   Ampicillin-Sulbactam (UNASYN) 3 g in sodium chloride 0.9 % 100 mL IVPB        3 g 100 mL/hr over 60 Minutes Intravenous  Once 05/14/11 0521 05/14/11 0727          Assessment Principal Problem:  *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    LOS: 9 days   Plan: D/C Lovenox.  Recheck INR in AM.   [name] J 05/23/2011

## 2011-05-24 LAB — PROTIME-INR: Prothrombin Time: 34.5 seconds — ABNORMAL HIGH (ref 11.6–15.2)

## 2011-05-24 NOTE — Consult Note (Signed)
WOC ostomy follow up Stoma type/location: LLQ stoma  Stomal assessment/size: 1 1/2" round  Peristomal assessment: pouch to be changed after shower per bedside nursing  Treatment options for stomal/peristomal skin: utilizing 2" barrier ring around stoma for creases at 3 and 9 o'clock   Output liq brown fecal material in pouch  Ostomy pouching: 1pc.flat with 2" barrier ring  Education provided: using pattern from previous wafer had pt cut new wafer at 1 1/2" round, instruction reviewed on taking backing from wafer and stretching 2" barrier ring to fit around stoma and around cut edge of wafer for build up of creases and better seal of pouch.  Last pouch change 05/22/11.    Pouch intact today without problems however pt wishes to take shower and wants to take pouch off during shower I have explained that stoma may function while in shower she is ok with this and will replace pouch with bedside RN assist after shower later today.    WOC team will follow   Thanks  Linzee Depaul Eliberto Ivory RN, CWOCN [phone])

## 2011-05-24 NOTE — Progress Notes (Signed)
ANTICOAGULATION CONSULT NOTE - Follow Up Consult  Pharmacy Consult for Coumadin Indication: thrombosis superior mesenteric vein   Allergies  Allergen Reactions  . Modafinil     REACTION: rash    Patient Measurements: Height: 5\' 7"  (170.2 cm) Weight: 174 lb 6.1 oz (79.1 kg) IBW/kg (Calculated) : 61.6   Vital Signs: Temp: 98.1 F (36.7 C) (12/26 0525) Temp src: Oral (12/26 0525) BP: 125/79 mmHg (12/26 0525) Pulse Rate: 74  (12/26 0525)  Labs:  Basename 05/24/11 0421 05/23/11 0515 05/22/11 0433  HGB -- -- --  HCT -- -- --  PLT -- -- --  APTT -- -- --  LABPROT 34.5* 30.0* 17.3*  INR 3.36* 2.81* 1.39  HEPARINUNFRC -- -- --  CREATININE -- -- --  CKTOTAL -- -- --  CKMB -- -- --  TROPONINI -- -- --   Estimated Creatinine Clearance: 83 ml/min (by C-G formula based on Cr of 0.73).   Medications:  Scheduled:     . levothyroxine  137 mcg Oral QAC breakfast  . losartan  50 mg Oral Daily  . mulitivitamin with minerals  1 tablet Oral Daily  . pantoprazole  40 mg Oral Q1200  . sertraline  100 mg Oral Daily  . warfarin  0.5 mg Oral ONCE-1800  . DISCONTD: enoxaparin (LOVENOX) injection  80 mg Subcutaneous Q12H    Assessment:  60 y/o F on anticoagulation for superior mesenteric vein thrombosis.  Lovenox was discontinued by MD yesterday before 5 day overlap with warfarin completed.  INR supratherapeutic today after warfarin 7.5, 7.5, 0.5mg  12/23-12/25.  Goal of Therapy:  INR 2-3   Plan:   No warfarin today  Follow INR daily while inpatient.  Elie Goody, PharmD  [phone] 05/24/2011,6:59 AM

## 2011-05-24 NOTE — Progress Notes (Signed)
05/14/11, Kathi Der RNC-MNN,BSN,CM received phone call from Dr. Johna Sheriff requesting pt be followed at [facility] upon discharge.  CM contacted [facility] and they stated they would be able to folow pt upon d/c since she is a pt of Dr. Lucie Leather.  Phone call placed to Dr. Johna Sheriff to let him know.  Pt given phone number to call to schedule f/u upon d/c based on Dr. Jamse Mead recommendation.[phone].

## 2011-05-24 NOTE — Progress Notes (Signed)
Patient ID: Aviyah Swetz, female   DOB: [birth year], 60 y.o.   MRN: [id number] 10 Days Post-Op  Subjective: No C/O  Objective: Vital signs in last 24 hours: Temp:  [98.1 F (36.7 C)-98.8 F (37.1 C)] 98.8 F (37.1 C) (12/26 1400) Pulse Rate:  [74-81] 81  (12/26 1400) Resp:  [19-20] 19  (12/26 1400) BP: (122-135)/(79-85) 122/82 mmHg (12/26 1400) SpO2:  [93 %-94 %] 93 % (12/26 1400) Weight:  [174 lb 6.1 oz (79.1 kg)] 174 lb 6.1 oz (79.1 kg) (12/26 0525) Last BM Date: 05/24/11  Intake/Output from previous day: 12/25 0701 - 12/26 0700 In: 320 [P.O.:320] Out: 2210 [Urine:1300; Stool:910] Intake/Output this shift: Total I/O In: -  Out: 1050 [Urine:800; Stool:250]  General appearance: alert and no distress GI: normal findings: soft, non-tender Incision/Wound: Wound clean, closing in nicely.  Stoma healthy and device intact  Lab Results:  No results found for this basename: WBC:2,HGB:2,HCT:2,PLT:2 in the last 72 hours BMET No results found for this basename: NA:2,K:2,CL:2,CO2:2,GLUCOSE:2,BUN:2,CREATININE:2,CALCIUM:2 in the last 72 hours PT/INR  Basename 05/24/11 0421 05/23/11 0515  LABPROT 34.5* 30.0*  INR 3.36* 2.81*   ABG No results found for this basename: PHART:2,PCO2:2,PO2:2,HCO3:2 in the last 72 hours  Studies/Results: No results found.  Anti-infectives: Anti-infectives     Start     Dose/Rate Route Frequency Ordered Stop   05/14/11 1300   Ampicillin-Sulbactam (UNASYN) 3 g in sodium chloride 0.9 % 100 mL IVPB  Status:  Discontinued        3 g 100 mL/hr over 60 Minutes Intravenous Every 6 hours 05/14/11 1230 05/14/11 1510   05/14/11 1300   ertapenem (INVANZ) 1 g in sodium chloride 0.9 % 50 mL IVPB  Status:  Discontinued        1 g 100 mL/hr over 30 Minutes Intravenous Every 24 hours 05/14/11 1152 05/22/11 0738   05/14/11 0530   Ampicillin-Sulbactam (UNASYN) 3 g in sodium chloride 0.9 % 100 mL IVPB        3 g 100 mL/hr over 60 Minutes Intravenous  Once  05/14/11 0521 05/14/11 0727          Assessment/Plan: s/p Procedure(s): EXPLORATORY LAPAROTOMY Overall doing well SMV thrombosis.  INR supratherapeutic, coumadin held today. Prob home in AM if INR near therapeutic   LOS: 10 days    Naiyana Barbian T 05/24/2011

## 2011-05-24 NOTE — Progress Notes (Signed)
Pt and pt's significant other (husband Virl Diamond) state that they do not feel "comfortable" being d/c until the Prothrombin Time and INR are WNL and not "supratherapeutic" as both levels were today. MD stated he will be in later today to see pt. Marcelino Duster, RN

## 2011-05-25 ENCOUNTER — Telehealth (INDEPENDENT_AMBULATORY_CARE_PROVIDER_SITE_OTHER): Payer: Self-pay

## 2011-05-25 LAB — PROTIME-INR
INR: 2.42 — ABNORMAL HIGH (ref 0.00–1.49)
Prothrombin Time: 26.7 seconds — ABNORMAL HIGH (ref 11.6–15.2)

## 2011-05-25 MED ORDER — OXYCODONE-ACETAMINOPHEN 5-325 MG PO TABS: 1.0000 | ORAL_TABLET | ORAL | Status: DC | PRN

## 2011-05-25 MED ORDER — WARFARIN 0.5 MG HALF TABLET
0.5000 mg | ORAL_TABLET | Freq: Once | ORAL | Status: AC
  Administered 2011-05-25: 0.5 mg via ORAL
  Filled 2011-05-25: qty 1

## 2011-05-25 NOTE — Progress Notes (Signed)
Patient ID: [name], female   DOB: 12-18-1950, 60 y.o.   MRN: [id number] 11 Days Post-Op  Subjective: No complaints this morning. Feels ready to go home.  Objective: Vital signs in last 24 hours: Temp:  [98.8 F (37.1 C)-99.9 F (37.7 C)] 99.9 F (37.7 C) (12/27 0545) Pulse Rate:  [81-96] 90  (12/27 0545) Resp:  [19-20] 20  (12/27 0545) BP: (121-130)/(82-87) 121/82 mmHg (12/27 0545) SpO2:  [93 %-95 %] 95 % (12/27 0545) Last BM Date: 05/24/11  Intake/Output from previous day: 12/26 0701 - 12/27 0700 In: -  Out: 4350 [Urine:2900; Stool:1450] Intake/Output this shift:    General appearance: alert and no distress GI: normal findings: soft, non-tender Incision/Wound:clean and healing well. Retention sutures are removed. Stoma device intact.  Lab Results:  No results found for this basename: WBC:2,HGB:2,HCT:2,PLT:2 in the last 72 hours BMET No results found for this basename: NA:2,K:2,CL:2,CO2:2,GLUCOSE:2,BUN:2,CREATININE:2,CALCIUM:2 in the last 72 hours PT/INR  Basename 05/25/11 0415 05/24/11 0421  LABPROT 26.7* 34.5*  INR 2.42* 3.36*   ABG No results found for this basename: PHART:2,PCO2:2,PO2:2,HCO3:2 in the last 72 hours  Studies/Results: No results found.  Anti-infectives: Anti-infectives     Start     Dose/Rate Route Frequency Ordered Stop   05/14/11 1300   Ampicillin-Sulbactam (UNASYN) 3 g in sodium chloride 0.9 % 100 mL IVPB  Status:  Discontinued        3 g 100 mL/hr over 60 Minutes Intravenous Every 6 hours 05/14/11 1230 05/14/11 1510   05/14/11 1300   ertapenem (INVANZ) 1 g in sodium chloride 0.9 % 50 mL IVPB  Status:  Discontinued        1 g 100 mL/hr over 30 Minutes Intravenous Every 24 hours 05/14/11 1152 05/22/11 0738   05/14/11 0530  Ampicillin-Sulbactam (UNASYN) 3 g in sodium chloride 0.9 % 100 mL IVPB       3 g 100 mL/hr over 60 Minutes Intravenous  Once 05/14/11 0521 05/14/11 0727          Assessment/Plan: s/p  Procedure(s): EXPLORATORY LAPAROTOMY Doing well and ready for discharge.  INR is therapeutic this morning. She will need Coumadin dosing by pharmacy today prior to discharge and will be followed up in the Coumadin clinic tomorrow.   LOS: 11 days    Martrell Eguia T 05/25/2011

## 2011-05-25 NOTE — Telephone Encounter (Signed)
Pts spouse called stating coumadin clinic is full and cannot see anyone else tomorrow. I called Erica at [facility] coumadin clinic. She advised pt can be seen by her pcp Dr Sander Radon and he will follow pt for her coumadin management until they can get pt back to clinic. I called Dr Elfredia Nevins office and pt has appt for 2:30pm tomorrow to have her labs and be seen for her coumadin f/u. I called pts husband and advised him of this. He states he will keep this appt.

## 2011-05-25 NOTE — Discharge Summary (Signed)
Patient ID: [name] [id number] 60 y.o. 07-28-1950  05/14/2011  Discharge date and time: 05/25/2011   Admitting Physician: Glenna Fellows T  Discharge Physician: Glenna Fellows T  Admission Diagnoses: Hypokalemia [276.8] Acute abdomen [789.00] Anemia [285.9] ABDOMINAL PAIN  Discharge Diagnoses: same plus anastomotic leak at the small bowel anastomosis  Operations: Procedure(s): EXPLORATORY LAPAROTOMY and end ileostomy  Admission Condition: serious  Discharged Condition: good  Indication for Admission: the patient is a 60 year old female with a complicated surgical history that includes diverting loop ileostomy almost 3 months ago after an anastomotic leak post laparoscopic total abdominal colectomy. She underwent takedown of her ileostomy 2 weeks prior to this admission. Initially she did well and was discharged home. However on the morning of admission she developed acute abdominal pain and was admitted and evaluated with a CT scan revealing free air or evidence of perforation at her small bowel anastomosis.  Hospital Course: patient was admitted by Dr. Daphine Deutscher and underwent exploratory laparotomy with resection of her anastomosis, and ileostomy in the left upper quadrant and the distal end of the ileum was left intra-abdominally. She remained ventilated and initially on pressors for 12-24 hours but rapidly stabilized and was extubated and off pressors quickly. She remained on broad-spectrum antibiotics. Her wound was left open and treated with moist saline dressing changes. She will be transferred to the floor quickly and her diet was started which she tolerated well and she was advanced quickly to a regular diet. She received approximately 10 days of IV antibiotics at which time she had been afebrile for several days with a normal white count and these were discontinued. 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 At the time of discharge she is feeling well. Tolerating regular diet. She is having no trouble with her ostomy care. Her wound is very clean and granulating well. Her INR is therapeutic at 2.4 and followup is arranged in the [place].  Consults: pulmonary/intensive care  Significant Diagnostic Studies: as above   Disposition: Home  Patient Instructions:   Misty, Ferrell  Home Medication Instructions JYN:[id number]   Printed on:05/25/11 [phone]  Medication Information                    meloxicam (MOBIC) 15 MG tablet Take 15 mg by mouth at bedtime.            SYNTHROID 137 MCG tablet Take 137 mcg by mouth daily.           albuterol (PROVENTIL HFA) 108 (90 BASE) MCG/ACT inhaler Inhale 2 puffs into the lungs every 6 (six) hours as needed.           zolpidem (AMBIEN) 10 MG tablet take 1 tablet by mouth at bedtime if needed           losartan (COZAAR) 50 MG tablet TAKE 1 TABLET BY MOUTH ONCE DAILY           acetaminophen (TYLENOL) 500 MG tablet Take 1,000 mg by mouth every 6 (six) hours as needed. FOR PAIN            montelukast (SINGULAIR) 10 MG tablet Take 10 mg by mouth at bedtime.            sertraline (ZOLOFT) 100 MG tablet Take 100 mg by mouth daily.            HYDROcodone-acetaminophen (NORCO) 5-325  MG per tablet 1-2 tabs every 4-6 hours prn pain           oxyCODONE-acetaminophen (PERCOCET) 5-325 MG per tablet Take 1-2 tablets by mouth every 4 (four) hours as needed.             Activity: no heavy lifting for 3 weeks Diet: regular diet Wound Care: daily moist saline gauze dressing changes  Follow-up:  With Dr. Johna Sheriff in 2 weeks.  Signed: Mariella Saa MD, FACS  05/25/2011, 8:28 AM

## 2011-05-25 NOTE — Progress Notes (Signed)
ANTICOAGULATION CONSULT NOTE - Follow Up Consult  Pharmacy Consult for:  Coumadin Indication:  Thrombosis of the superior mesenteric vein  Allergies  Allergen Reactions  . Modafinil     REACTION: rash    Patient Measurements: Height: 5\' 7"  (170.2 cm) Weight: 174 lb 6.1 oz (79.1 kg) IBW/kg (Calculated) : 61.6    Vital Signs: Temp: 99.9 F (37.7 C) (12/27 0545) Temp src: Oral (12/27 0545) BP: 121/82 mmHg (12/27 0545) Pulse Rate: 90  (12/27 0545)  Labs:  Alvira Philips 05/25/11 0415 05/24/11 0421 05/23/11 0515  HGB -- -- --  HCT -- -- --  PLT -- -- --  APTT -- -- --  LABPROT 26.7* 34.5* 30.0*  INR 2.42* 3.36* 2.81*  HEPARINUNFRC -- -- --  CREATININE -- -- --  CKTOTAL -- -- --  CKMB -- -- --  TROPONINI -- -- --   Estimated Creatinine Clearance: 83 ml/min (by C-G formula based on Cr of 0.73).   Medications:  Scheduled:    . levothyroxine  137 mcg Oral QAC breakfast  . losartan  50 mg Oral Daily  . mulitivitamin with minerals  1 tablet Oral Daily  . pantoprazole  40 mg Oral Q1200  . 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  Anticipate discharge today with follow-up at the [facility] Coumadin clinic tomorrow.  Goal of Therapy:  INR 2-3   Plan:  0.5mg  dose today prior to discharge  [employer] R.Ph.  Ph. [phone] 05/25/2011,8:59 AM

## 2011-05-25 NOTE — Progress Notes (Signed)
05/25/11, Kathi Der, RNC-MNN, BSN.  CM received referral for daily moist saline wound packing and ostomy care.  Pt is currently active with [facility] and chooses to continue with their health services.  [facility] contacted with new order.  Spoke with Clydie Braun and all requested information faxed with confirmation.[phone].

## 2011-05-26 ENCOUNTER — Ambulatory Visit: Payer: Managed Care, Other (non HMO)

## 2011-05-26 ENCOUNTER — Telehealth (INDEPENDENT_AMBULATORY_CARE_PROVIDER_SITE_OTHER): Payer: Self-pay | Admitting: General Surgery

## 2011-05-26 ENCOUNTER — Other Ambulatory Visit: Payer: Self-pay | Admitting: Family Medicine

## 2011-05-26 ENCOUNTER — Other Ambulatory Visit: Payer: Self-pay | Admitting: Internal Medicine

## 2011-05-26 ENCOUNTER — Encounter (INDEPENDENT_AMBULATORY_CARE_PROVIDER_SITE_OTHER): Payer: Commercial Indemnity | Admitting: General Surgery

## 2011-05-26 DIAGNOSIS — I82409 Acute embolism and thrombosis of unspecified deep veins of unspecified lower extremity: Secondary | ICD-10-CM

## 2011-05-26 LAB — POCT INR: INR: 2.3

## 2011-05-26 MED ORDER — WARFARIN SODIUM 1 MG PO TABS: 1.0000 mg | ORAL_TABLET | ORAL | Status: DC

## 2011-05-26 NOTE — Patient Instructions (Signed)
  Latest dosing instructions   Total Sun Mon Tue Wed Thu Fri Sat   16 7.5 mg 7.5 mg 0.5 mg Hold 0.5 mg Hold     (7.5 mg1) (7.5 mg1) (1 mg0.5) Hold (1 mg0.5) Hold

## 2011-05-26 NOTE — Telephone Encounter (Signed)
CHARLES Yassin CALLED RE CHECKING COUMADIN LEVELS OVER WEEKEND FOR HIS WIFE Charlyn. I ASKED DR. Haroldine Laws RE CHECKING LEVELS AND HE SAID THE DECISION WOULD BE MADE BY DR. Lucie Leather OFFICE AS TO COUMADIN DOSAGE AND CHECKING LEVELS/ MR Mosco INDICATED THAT COUMADIN LEVEL WAS CHECKED TODAY AT DR. Lucie Leather OFFICE AND THAT Eduardo WAS GIVEN INSTRUCTION FOR COUMADIN DOSAGE OVER THIS WEEKEND AND THAT SHE HAS AN APPOINTMENT ON Monday 05-29-11 FOR COUMADIN LEVEL RECHECK.Lanier Prude

## 2011-05-28 ENCOUNTER — Inpatient Hospital Stay (HOSPITAL_COMMUNITY)
Admission: EM | Admit: 2011-05-28 | Discharge: 2011-06-01 | DRG: 193 | Disposition: A | Discharge status: Home / Self Care | Payer: Managed Care, Other (non HMO) | Attending: Internal Medicine | Admitting: Internal Medicine

## 2011-05-28 ENCOUNTER — Emergency Department (HOSPITAL_COMMUNITY): Payer: Managed Care, Other (non HMO)

## 2011-05-28 ENCOUNTER — Other Ambulatory Visit: Payer: Self-pay

## 2011-05-28 ENCOUNTER — Encounter (HOSPITAL_COMMUNITY): Payer: Self-pay | Admitting: Adult Health

## 2011-05-28 DIAGNOSIS — R5383 Other fatigue: Secondary | ICD-10-CM

## 2011-05-28 DIAGNOSIS — Z9049 Acquired absence of other specified parts of digestive tract: Secondary | ICD-10-CM

## 2011-05-28 DIAGNOSIS — I82409 Acute embolism and thrombosis of unspecified deep veins of unspecified lower extremity: Secondary | ICD-10-CM

## 2011-05-28 DIAGNOSIS — K55059 Acute (reversible) ischemia of intestine, part and extent unspecified: Secondary | ICD-10-CM | POA: Diagnosis present

## 2011-05-28 DIAGNOSIS — J309 Allergic rhinitis, unspecified: Secondary | ICD-10-CM

## 2011-05-28 DIAGNOSIS — R5381 Other malaise: Secondary | ICD-10-CM

## 2011-05-28 DIAGNOSIS — R1013 Epigastric pain: Secondary | ICD-10-CM

## 2011-05-28 DIAGNOSIS — R071 Chest pain on breathing: Secondary | ICD-10-CM | POA: Diagnosis present

## 2011-05-28 DIAGNOSIS — J019 Acute sinusitis, unspecified: Secondary | ICD-10-CM

## 2011-05-28 DIAGNOSIS — F329 Major depressive disorder, single episode, unspecified: Secondary | ICD-10-CM | POA: Diagnosis present

## 2011-05-28 DIAGNOSIS — Y95 Nosocomial condition: Secondary | ICD-10-CM

## 2011-05-28 DIAGNOSIS — E039 Hypothyroidism, unspecified: Secondary | ICD-10-CM

## 2011-05-28 DIAGNOSIS — J189 Pneumonia, unspecified organism: Principal | ICD-10-CM

## 2011-05-28 DIAGNOSIS — R062 Wheezing: Secondary | ICD-10-CM

## 2011-05-28 DIAGNOSIS — E785 Hyperlipidemia, unspecified: Secondary | ICD-10-CM

## 2011-05-28 DIAGNOSIS — R0781 Pleurodynia: Secondary | ICD-10-CM

## 2011-05-28 DIAGNOSIS — M25519 Pain in unspecified shoulder: Secondary | ICD-10-CM

## 2011-05-28 DIAGNOSIS — R0602 Shortness of breath: Secondary | ICD-10-CM | POA: Diagnosis present

## 2011-05-28 DIAGNOSIS — N200 Calculus of kidney: Secondary | ICD-10-CM

## 2011-05-28 DIAGNOSIS — K631 Perforation of intestine (nontraumatic): Secondary | ICD-10-CM

## 2011-05-28 DIAGNOSIS — K55069 Acute infarction of intestine, part and extent unspecified: Secondary | ICD-10-CM

## 2011-05-28 DIAGNOSIS — B9789 Other viral agents as the cause of diseases classified elsewhere: Secondary | ICD-10-CM

## 2011-05-28 DIAGNOSIS — D72829 Elevated white blood cell count, unspecified: Secondary | ICD-10-CM | POA: Diagnosis present

## 2011-05-28 DIAGNOSIS — F3289 Other specified depressive episodes: Secondary | ICD-10-CM

## 2011-05-28 DIAGNOSIS — R0600 Dyspnea, unspecified: Secondary | ICD-10-CM

## 2011-05-28 DIAGNOSIS — I10 Essential (primary) hypertension: Secondary | ICD-10-CM

## 2011-05-28 HISTORY — DX: Nosocomial condition: Y95

## 2011-05-28 HISTORY — DX: Pneumonia, unspecified organism: J18.9

## 2011-05-28 LAB — URINALYSIS, ROUTINE W REFLEX MICROSCOPIC
Bilirubin Urine: NEGATIVE
Glucose, UA: NEGATIVE mg/dL
Ketones, ur: NEGATIVE mg/dL
Leukocytes, UA: NEGATIVE
pH: 5.5 (ref 5.0–8.0)

## 2011-05-28 LAB — CARDIAC PANEL(CRET KIN+CKTOT+MB+TROPI)
CK, MB: 1.3 ng/mL (ref 0.3–4.0)
CK, MB: 1 ng/mL (ref 0.3–4.0)
Relative Index: INVALID (ref 0.0–2.5)
Total CK: 15 U/L (ref 7–177)

## 2011-05-28 LAB — CBC
HCT: 35.6 % — ABNORMAL LOW (ref 36.0–46.0)
MCHC: 31.7 g/dL (ref 30.0–36.0)
MCV: 89.4 fL (ref 78.0–100.0)
RDW: 15.3 % (ref 11.5–15.5)

## 2011-05-28 LAB — PROTIME-INR: INR: 1.78 — ABNORMAL HIGH (ref 0.00–1.49)

## 2011-05-28 LAB — HEPATIC FUNCTION PANEL
ALT: 29 U/L (ref 0–35)
AST: 25 U/L (ref 0–37)
Total Protein: 7.5 g/dL (ref 6.0–8.3)

## 2011-05-28 LAB — POCT I-STAT, CHEM 8
BUN: 14 mg/dL (ref 6–23)
Calcium, Ion: 1.31 mmol/L (ref 1.12–1.32)
Chloride: 108 mEq/L (ref 96–112)
Glucose, Bld: 115 mg/dL — ABNORMAL HIGH (ref 70–99)

## 2011-05-28 LAB — URINE MICROSCOPIC-ADD ON

## 2011-05-28 MED ORDER — IOHEXOL 300 MG/ML  SOLN
100.0000 mL | Freq: Once | INTRAMUSCULAR | Status: AC | PRN
  Administered 2011-05-28: 100 mL via INTRAVENOUS

## 2011-05-28 MED ORDER — ACETAMINOPHEN 650 MG RE SUPP: 650.0000 mg | Freq: Four times a day (QID) | RECTAL | Status: DC | PRN

## 2011-05-28 MED ORDER — HYDROMORPHONE HCL PF 1 MG/ML IJ SOLN
INTRAMUSCULAR | Status: AC
  Administered 2011-05-28: 1 mg via INTRAVENOUS
  Filled 2011-05-28: qty 1

## 2011-05-28 MED ORDER — VANCOMYCIN HCL IN DEXTROSE 1-5 GM/200ML-% IV SOLN
1000.0000 mg | Freq: Once | INTRAVENOUS | Status: AC
  Administered 2011-05-28: 1000 mg via INTRAVENOUS
  Filled 2011-05-28: qty 200

## 2011-05-28 MED ORDER — HYDROMORPHONE HCL PF 1 MG/ML IJ SOLN
1.0000 mg | INTRAMUSCULAR | Status: DC | PRN
  Administered 2011-05-28 – 2011-05-31 (×8): 1 mg via INTRAVENOUS
  Filled 2011-05-28 (×7): qty 1

## 2011-05-28 MED ORDER — ONDANSETRON HCL 4 MG PO TABS: 4.0000 mg | ORAL_TABLET | Freq: Four times a day (QID) | ORAL | Status: DC | PRN

## 2011-05-28 MED ORDER — ZOLPIDEM TARTRATE 10 MG PO TABS
10.0000 mg | ORAL_TABLET | Freq: Every evening | ORAL | Status: DC | PRN
  Administered 2011-05-28 – 2011-05-31 (×4): 10 mg via ORAL
  Filled 2011-05-28 (×4): qty 1

## 2011-05-28 MED ORDER — LOSARTAN POTASSIUM 50 MG PO TABS
50.0000 mg | ORAL_TABLET | Freq: Every day | ORAL | Status: DC
  Administered 2011-05-29 – 2011-06-01 (×4): 50 mg via ORAL
  Filled 2011-05-28 (×4): qty 1

## 2011-05-28 MED ORDER — ACETAMINOPHEN 325 MG PO TABS: 650.0000 mg | ORAL_TABLET | Freq: Four times a day (QID) | ORAL | Status: DC | PRN

## 2011-05-28 MED ORDER — OXYCODONE-ACETAMINOPHEN 5-325 MG PO TABS
1.0000 | ORAL_TABLET | ORAL | Status: DC | PRN
  Administered 2011-05-28 – 2011-06-01 (×6): 2 via ORAL
  Filled 2011-05-28 (×2): qty 2
  Filled 2011-05-28: qty 1
  Filled 2011-05-28 (×3): qty 2
  Filled 2011-05-28: qty 1

## 2011-05-28 MED ORDER — SODIUM CHLORIDE 0.9 % IV SOLN
INTRAVENOUS | Status: DC
  Administered 2011-05-28: 20 mL/h via INTRAVENOUS

## 2011-05-28 MED ORDER — WARFARIN SODIUM 2 MG PO TABS
2.0000 mg | ORAL_TABLET | Freq: Once | ORAL | Status: AC
  Administered 2011-05-28: 2 mg via ORAL
  Filled 2011-05-28: qty 1

## 2011-05-28 MED ORDER — MELOXICAM 15 MG PO TABS
15.0000 mg | ORAL_TABLET | Freq: Every day | ORAL | Status: DC
  Administered 2011-05-29 – 2011-05-31 (×3): 15 mg via ORAL
  Filled 2011-05-28 (×4): qty 1

## 2011-05-28 MED ORDER — BISACODYL 5 MG PO TBEC: 5.0000 mg | DELAYED_RELEASE_TABLET | Freq: Every day | ORAL | Status: DC | PRN

## 2011-05-28 MED ORDER — HYDROMORPHONE HCL PF 1 MG/ML IJ SOLN
1.0000 mg | Freq: Once | INTRAMUSCULAR | Status: AC
  Administered 2011-05-28: 1 mg via INTRAVENOUS

## 2011-05-28 MED ORDER — HYDROMORPHONE HCL PF 1 MG/ML IJ SOLN
1.0000 mg | INTRAMUSCULAR | Status: DC | PRN
  Administered 2011-05-28 (×2): 1 mg via INTRAVENOUS
  Filled 2011-05-28 (×2): qty 1

## 2011-05-28 MED ORDER — SERTRALINE HCL 100 MG PO TABS
100.0000 mg | ORAL_TABLET | Freq: Every day | ORAL | Status: DC
  Administered 2011-05-29 – 2011-06-01 (×4): 100 mg via ORAL
  Filled 2011-05-28 (×4): qty 1

## 2011-05-28 MED ORDER — ONDANSETRON HCL 4 MG/2ML IJ SOLN
4.0000 mg | Freq: Once | INTRAMUSCULAR | Status: AC
  Administered 2011-05-28: 4 mg via INTRAVENOUS
  Filled 2011-05-28: qty 2

## 2011-05-28 MED ORDER — HYDROMORPHONE HCL PF 1 MG/ML IJ SOLN
1.0000 mg | Freq: Once | INTRAMUSCULAR | Status: AC
  Administered 2011-05-28: 1 mg via INTRAVENOUS
  Filled 2011-05-28: qty 1

## 2011-05-28 MED ORDER — LEVOTHYROXINE SODIUM 137 MCG PO TABS
137.0000 ug | ORAL_TABLET | Freq: Every day | ORAL | Status: DC
  Administered 2011-05-29 – 2011-06-01 (×4): 137 ug via ORAL
  Filled 2011-05-28 (×4): qty 1

## 2011-05-28 MED ORDER — HYDROMORPHONE HCL PF 1 MG/ML IJ SOLN
1.0000 mg | Freq: Once | INTRAMUSCULAR | Status: DC
  Filled 2011-05-28: qty 1

## 2011-05-28 MED ORDER — MONTELUKAST SODIUM 10 MG PO TABS
10.0000 mg | ORAL_TABLET | Freq: Every day | ORAL | Status: DC
  Administered 2011-05-29 – 2011-05-31 (×3): 10 mg via ORAL
  Filled 2011-05-28 (×4): qty 1

## 2011-05-28 MED ORDER — ONDANSETRON HCL 4 MG/2ML IJ SOLN
4.0000 mg | Freq: Three times a day (TID) | INTRAMUSCULAR | Status: DC | PRN
  Administered 2011-05-28: 4 mg via INTRAVENOUS
  Filled 2011-05-28 (×2): qty 2

## 2011-05-28 MED ORDER — ONDANSETRON HCL 4 MG/2ML IJ SOLN: 4.0000 mg | Freq: Four times a day (QID) | INTRAMUSCULAR | Status: DC | PRN

## 2011-05-28 MED ORDER — VANCOMYCIN HCL IN DEXTROSE 1-5 GM/200ML-% IV SOLN
1000.0000 mg | Freq: Two times a day (BID) | INTRAVENOUS | Status: DC
  Administered 2011-05-29 – 2011-05-30 (×4): 1000 mg via INTRAVENOUS
  Filled 2011-05-28 (×6): qty 200

## 2011-05-28 MED ORDER — PIPERACILLIN-TAZOBACTAM 3.375 G IVPB 30 MIN
3.3750 g | Freq: Once | INTRAVENOUS | Status: AC
  Administered 2011-05-28: 3.375 g via INTRAVENOUS
  Filled 2011-05-28: qty 50

## 2011-05-28 MED ORDER — GUAIFENESIN-DM 100-10 MG/5ML PO SYRP: 5.0000 mL | ORAL_SOLUTION | ORAL | Status: DC | PRN

## 2011-05-28 MED ORDER — PIPERACILLIN-TAZOBACTAM 3.375 G IVPB
3.3750 g | Freq: Three times a day (TID) | INTRAVENOUS | Status: DC
  Administered 2011-05-29 – 2011-05-31 (×8): 3.375 g via INTRAVENOUS
  Filled 2011-05-28 (×11): qty 50

## 2011-05-28 MED ORDER — PIPERACILLIN-TAZOBACTAM 3.375 G IVPB: 3.3750 g | Freq: Once | INTRAVENOUS | Status: DC

## 2011-05-28 MED ORDER — SODIUM CHLORIDE 0.9 % IV SOLN
INTRAVENOUS | Status: DC
  Administered 2011-05-31: 11:00:00 via INTRAVENOUS

## 2011-05-28 MED ORDER — ALBUTEROL SULFATE HFA 108 (90 BASE) MCG/ACT IN AERS
2.0000 | INHALATION_SPRAY | Freq: Four times a day (QID) | RESPIRATORY_TRACT | Status: DC | PRN
  Filled 2011-05-28: qty 6.7

## 2011-05-28 NOTE — H&P (Signed)
PCP:   Kristian Covey, MD   Chief Complaint:  Worsening shortness of breath and left-sided pleuritic pain  HPI: The patient is a 60 year old white female with past medical history significant recent discharge from hospital -4 days ago from the surgery service are following hospitalization for intervral perforation or anastomotic leak and she had him an exploratory laparotomy with a ileostomy done per [facility], so history of hypothyroidism, hypertension depression and diverticulitis who presents with above complaints.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 She has an extensive history of abdominal surgeries -as listed had a total colectomy with a J-pouch 03/01/2011, and subsequently arm ileostomy closure/take down was done a 05/02/2011 and then on 1216 she had the exploratory laparotomy with small bowel resection and end ileostomy on 12/16 per Dr Daphine Deutscher. She states that she was awakened by a sharp pain in her shoulder and back this a.m and she was unable to breath. She states that initially the pain was mostly with deep inspiration but subsequently just got more short of breath and so they came to the ED. She denies cough, fevers, also denies any URI symptoms. She was seen in the ED and a chest x-ray was done which showed by basilar airspace disease worse on the left are early infection not excluded also question eft pleural effusion. CT angiogram of the chest was done because of size suspicion for PE in and it showed slight atelectasis at the bases left greater than right with a tiny left effusion - no pulmonary emboli. She was noted to have a leukocytosis of 16 and she started on empiric antibiotics for hospital-acquired pneumonia and is admitted for further evaluation and management. Surgery was consulted per the ED given that she was just recently discharged from their service and they requested admission to the hospitalist service  Review of Systems:  The  patient denies anorexia, fever, weight loss,, vision loss, decreased hearing, hoarseness, chest pain, syncope, peripheral edema, balance deficits, hemoptysis, abdominal pain, melena, hematochezia, severe indigestion/heartburn, hematuria, incontinence,  muscle weakness, transient blindness, difficulty walking, depression, unusual weight change, abnormal bleeding.  Past Medical History: Past Medical History  Diagnosis Date  . HYPOTHYROIDISM 10/06/2008  . HYPERLIPIDEMIA 10/06/2008  . DEPRESSION 02/23/2010  . HYPERTENSION 10/06/2008  . DIVERTICULITIS, ACUTE 11/10/2008  . ALLERGIC RHINITIS 07/23/2009  . Diverticulosis of colon 11/10/2008   Past Surgical History  Procedure Date  . Abdominal hysterectomy 1986  . Thyroid surgery 1982    tumor removed  . Cesarean section 1983  . Rotator cuff repair 2005  . Kidney stone surgery 2000, 2009, 2010  . Laparoscopic assissted total colectomy w/ j-pouch 03/01/11  . Ileostomy closure 05/02/2011    Procedure: ILEOSTOMY TAKEDOWN;  Surgeon: Mariella Saa, MD;  Location: [facility] ORS;  Service: General;  Laterality: N/A;  . Laparotomy 05/14/2011    Procedure: EXPLORATORY LAPAROTOMY;  Surgeon: Valarie Merino, MD;  Location: [facility];  Service: General;  Laterality: N/A;  small bowel resection end ileostomy    Medications: Prior to Admission medications   Medication Sig Start Date End Date Taking? Authorizing Provider  acetaminophen (TYLENOL) 500 MG tablet Take 1,000 mg by mouth every 6 (six) hours as needed. FOR PAIN    Yes Historical Provider, MD  Bioflavonoid Products (VITAMIN C) CHEW Chew 1 tablet by mouth daily.     Yes Historical Provider, MD  losartan (COZAAR) 50 MG tablet TAKE 1 TABLET BY MOUTH ONCE DAILY 11/21/10  Yes Elberta Fortis  Burchette  meloxicam (MOBIC) 15 MG tablet Take 15 mg by mouth at bedtime.    Yes Historical Provider, MD  montelukast (SINGULAIR) 10 MG tablet Take 10 mg by mouth at bedtime.  12/07/10  Yes Bruce W Burchette  oxyCODONE-acetaminophen  (PERCOCET) 5-325 MG per tablet Take 1-2 tablets by mouth every 4 (four) hours as needed. 05/25/11 06/04/11 Yes Mariella Saa, MD  sertraline (ZOLOFT) 100 MG tablet Take 100 mg by mouth daily.  02/08/11  Yes Bruce W Burchette  SYNTHROID 137 MCG tablet Take 137 mcg by mouth daily. 08/03/10  Yes Bruce W Burchette  warfarin (COUMADIN) 1 MG tablet Take 1 tablet (1 mg total) by mouth as directed. 05/26/11 05/25/12 Yes Bruce W Burchette  zolpidem (AMBIEN) 10 MG tablet take 1 tablet by mouth at bedtime if needed 09/26/10  Yes Bruce W Burchette  albuterol (PROVENTIL HFA) 108 (90 BASE) MCG/ACT inhaler Inhale 2 puffs into the lungs every 6 (six) hours as needed. 08/03/10   Elberta Fortis Burchette    Allergies:   Allergies  Allergen Reactions  . Modafinil Rash    REACTION: rash    Social History:  reports that she quit smoking about 30 years ago. Her smoking use included Cigarettes. She has a 10 pack-year smoking history. She has never used smokeless tobacco. She reports that she does not drink alcohol or use illicit drugs.  Family History: Family History  Problem Relation Age of Onset  . Arthritis Other   . Mental illness Other   . COPD Mother   . Deep vein thrombosis Father     Physical Exam: Filed Vitals:   05/28/11 1054 05/28/11 1200 05/28/11 1510  BP: 132/83 111/50 117/63  Pulse: 110 108 108  Temp: 99.3 F (37.4 C)  98.3 F (36.8 C)  TempSrc: Oral  Axillary  Resp: 28  19  SpO2: 95% 99% 100%   Constitutional: Vital signs reviewed.   in no acute distress and cooperative with exam. Alert and oriented x3.  Head: Normocephalic and atraumatic Mouth: no erythema or exudates, MMM Eyes: PERRL, EOMI, conjunctivae normal, No scleral icterus.  Neck: Supple, Trachea midline normal ROM, No JVD, mass, thyromegaly, or carotid bruit present.  Cardiovascular: RRR, S1 normal, S2 normal, no MRG, pulses symmetric and intact bilaterally Pulmonary/Chest: CTAB, no wheezes, rales, or rhonchi Abdominal: Soft.  Non-tender, ileostomy bag LUQ with brown stool, with surgical wound to the right of bag clean and dry non-distended, bowel sounds are normal, no masses, organomegaly, or guarding present.  GU: no CVA tenderness Musculoskeletal: No joint deformities, erythema, or stiffness, ROM full and no nontender Hematology: no cervical, inginal, or axillary adenopathy.  Neurological: A&O x3, Strenght is normal and symmetric bilaterally, cranial nerve II-XII are grossly intact, no focal motor deficit, sensory intact to light touch bilaterally.  Skin: Warm, dry and intact. No rash, cyanosis, or clubbing.      Labs on Admission:   [facility] 05/28/11 1218  NA 139  K 3.5  CL 108  CO2 --  GLUCOSE 115*  BUN 14  CREATININE 0.80  CALCIUM --  MG --  PHOS --    Basename 05/28/11 1130  AST 25  ALT 29  ALKPHOS 76  BILITOT 0.4  PROT 7.5  ALBUMIN 2.8*   No results found for this basename: LIPASE:2,AMYLASE:2 in the last 72 hours  Basename 05/28/11 1218 05/28/11 1130  WBC -- 16.0*  NEUTROABS -- --  HGB 12.6 11.3*  HCT 37.0 35.6*  MCV -- 89.4  PLT --  594*    Basename 05/28/11 1130  CKTOTAL 16  CKMB 1.3  CKMBINDEX --  TROPONINI <0.30   No results found for this basename: TSH,T4TOTAL,FREET3,T3FREE,THYROIDAB in the last 72 hours No results found for this basename: VITAMINB12:2,FOLATE:2,FERRITIN:2,TIBC:2,IRON:2,RETICCTPCT:2 in the last 72 hours  Radiological Exams on Admission: Ct Angio Chest W/cm &/or Wo Cm  05/28/2011  *RADIOLOGY REPORT*  Clinical Data:  Acute onset of severe left sided back pain that radiates into the left flank.  Shortness of breath.  Ileostomy 3 weeks ago.  CT ANGIOGRAPHY CHEST WITH CONTRAST  Technique:  Multidetector CT imaging of the chest was performed using the standard protocol during bolus administration of intravenous contrast.  Multiplanar CT image reconstructions including MIPs were obtained to evaluate the vascular anatomy.  Contrast: OMNIPAQUE IOHEXOL 300  MG/ML IV SOLN  Comparison:  Chest x-ray dated 05/28/2011  Findings:  There are no pulmonary emboli.  There is patchy atelectasis at both bases, more on the left than the right. Tiny left effusion as well as tiny pericardial effusion.  Mild cardiomegaly.  No osseous abnormalities.  No hilar or mediastinal adenopathy.  Review of the MIP images confirms the above findings.  IMPRESSION:  Slight atelectasis at the bases, left greater than right.  Tiny left effusion.  No pulmonary emboli.  Original Report Authenticated By: Gwynn Burly, M.D.   Ct Abdomen Pelvis W Contrast  05/14/2011  *RADIOLOGY REPORT*  Clinical Data: Recent surgery, abdominal pain  CT ABDOMEN AND PELVIS WITH CONTRAST  Technique:  Multidetector CT imaging of the abdomen and pelvis was performed following the standard protocol during bolus administration of intravenous contrast.  Contrast: OMNIPAQUE IOHEXOL 300 MG/ML IV SOLN  Comparison: 05/09/2011 CT  Findings: Heart size upper normal limits to mildly enlarged. Bibasilar linear opacities.  Multiple hypodensities within the liver are unchanged. Unremarkable spleen, pancreas, adrenal glands.  Symmetric renal enhancement.  There are nonobstructing left renal stones. Nonobstructing lower pole right renal stones as well.  Too small further characterize bilateral renal hypodensities.  The gallbladder is distended.  No radiodense gallstones.  No biliary ductal dilatation.  Interval increased free intraperitoneal air and fluid.  Bowel anastomotic suture in the pelvis status post subtotal colectomy. Bowel anastomotic suture noted in the right mid abdomen.  There are thickened/inflamed appearing loops of small bowel near the anastomosis in the right mid abdomen.  Prominent lymph nodes within the right lower quadrant, measuring up to 1.3 cm short axis.  Small fluid pocket at the ileostomy takedown site within the subcutaneous fat superficial to the peritoneum is nonspecific.  Thrombus within the SMV.   The portal vein and splenic vein are patent.  The SMA, celiac axis, and IMA are patent.  Scattered atherosclerosis.  Thin-walled bladder.  Absent uterus.  No adnexal mass.  No acute osseous abnormality.  IMPRESSION: Interval perforation or anastamotic leak, with increased free intraperitoneal air and fluid.  Thickened loop of small bowel within the right lower abdomen. Given the SMV thrombus, this may represent ischemic changes.  Enlarged ileocolic lymph node measuring 1.3 cm short axis.  Given the adjacent inflammatory changes, may simply be reactive. However, if the patient underwent a colectomy for malignancy, metastatic involvement of this lymph node should also be considered.  Critical Value/emergent results were called by telephone at the time of interpretation on 05/14/2011  at 06:40 a.m.  to the surgeon, Dr. Daphine Deutscher, who verbally acknowledged these results.  Original Report Authenticated By: Waneta Martins, M.D.   Dg Chest Port 1  View  05/28/2011  *RADIOLOGY REPORT*  Clinical Data: Pain.  Shortness of breath.  Back pain.  PORTABLE CHEST - 1 VIEW  Comparison: Portal chest 05/15/2011.  Findings: Mild cardiomegaly is again noted.  Bibasilar airspace disease is worse on the left.  Mild pulmonary vascular congestion is evident.  A left pleural effusion is not excluded.  The visualized soft tissues and bony thorax are unremarkable.  IMPRESSION:  1.  Stable cardiomegaly with mild pulmonary vascular congestion. 2.  Bibasilar airspace disease, worse on the left.  Early infection is not excluded. 3.  Question left pleural effusion.  Original Report Authenticated By: Jamesetta Orleans. MATTERN, M.D.     Assessment/Plan Present on Admission:  .Probable Hospital-acquired pneumonia -As discussed above, 60 year old presenting with shortness of breath and pleuritic chest pain CT angiogram of the chest and negative for a PE, and a chest x-ray with bibasilar airspace disease, worse on the left.   Stated on Empiric  antibiotics and follow.  .Thrombosis SMV on CT scan -Continue anticoagulation - Coumadin per pharmacy, with daily PT/INR   .HYPOTHYROIDISM -Continue Synthroid   .DEPRESSION -continue outpatient medications. Danyle Boening C 05/28/2011, 6:10 PM

## 2011-05-28 NOTE — ED Notes (Signed)
Patient and husband request that if patient is to be admitted for them to be admitted to [facility]. Patient and husband express that they would feel more comfortable because when they were there before they were treated exceptionally well and that the staff there feels like family.

## 2011-05-28 NOTE — ED Notes (Signed)
Post op of ileostomy 3 weeks ago, today woke up with severe left sided back pain that radiates down to left flank. Pt is pale and SOB, 94-95 on RA, denies Chest pain and nausea, denies abdominal pain. Pain is worse with inspiration and described as sharp.

## 2011-05-28 NOTE — ED Provider Notes (Addendum)
History     CSN: [id number]  Arrival date & time 05/28/11  1050   First MD Initiated Contact with Patient 05/28/11 1118      Chief Complaint  Patient presents with  . Shortness of Breath    (Consider location/radiation/quality/duration/timing/severity/associated sxs/prior treatment) Patient is a 60 y.o. female presenting with shortness of breath. The history is provided by the patient.  Shortness of Breath  Associated symptoms include shortness of breath. Pertinent negatives include no fever.  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 Post op course complication by 2 episodes anastamotic breakdown/leak over the course of the past 3 months requiring emergent surgery, most recently on 05/14/11. Pt states was recovering from last surgery well. Has been eating and drinking, having normal ostomy output. No nv. No fever or chills. In past day pt has developed a sharp upper left back, upper left post/lat chest pain, worse w deep breathing. Also notes sob. No ant chest pain. Denies hx pe, but states was told had clot in 'abdominal vessel' during recent hospital stay and is on coumadin. No leg pain or swelling. No cough or uri c/o. No fever. Denies hx cad, states extensive prior workup incl cath neg.   Past Medical History  Diagnosis Date  . HYPOTHYROIDISM 10/06/2008  . HYPERLIPIDEMIA 10/06/2008  . DEPRESSION 02/23/2010  . HYPERTENSION 10/06/2008  . DIVERTICULITIS, ACUTE 11/10/2008  . ALLERGIC RHINITIS 07/23/2009  . Diverticulosis of colon 11/10/2008    Past Surgical History  Procedure Date  . Abdominal hysterectomy 1986  . Thyroid surgery 1982    tumor removed  . Cesarean section 1983  . Rotator cuff repair 2005  . Kidney stone surgery 2000, 2009, 2010  . Laparoscopic assissted total colectomy w/ j-pouch 03/01/11  . Ileostomy closure 05/02/2011    Procedure: ILEOSTOMY TAKEDOWN;  Surgeon: Mariella Saa, MD;  Location: [facility];  Service: General;  Laterality: N/A;  .  Laparotomy 05/14/2011    Procedure: EXPLORATORY LAPAROTOMY;  Surgeon: Valarie Merino, MD;  Location: [facility];  Service: General;  Laterality: N/A;  small bowel resection end ileostomy    Family History  Problem Relation Age of Onset  . Arthritis Other   . Mental illness Other   . COPD Mother   . Deep vein thrombosis Father     History  Substance Use Topics  . Smoking status: Former Smoker -- 1.0 packs/day for 10 years    Types: Cigarettes    Quit date: 08/02/1980  . Smokeless tobacco: Never Used  . Alcohol Use: No    OB History    Grav Para Term Preterm Abortions TAB SAB Ect Mult Living                  Review of Systems  Constitutional: Negative for fever and chills.  HENT: Negative for neck pain.   Eyes: Negative for redness.  Respiratory: Positive for shortness of breath.   Cardiovascular: Negative for leg swelling.  Gastrointestinal: Negative for abdominal pain.  Genitourinary: Negative for flank pain.  Musculoskeletal: Negative for back pain.  Skin: Negative for rash.  Neurological: Negative for headaches.  Hematological: Does not bruise/bleed easily.  Psychiatric/Behavioral: Negative for confusion.    Allergies  Modafinil  Home Medications   Current Outpatient Rx  Name Route Sig Dispense Refill  . ACETAMINOPHEN 500 MG PO TABS Oral Take 1,000 mg by mouth every 6 (six) hours as needed. FOR PAIN     . VITAMIN C PO CHEW Oral Chew 1 tablet by  mouth daily.      Marland Kitchen LOSARTAN POTASSIUM 50 MG PO TABS  TAKE 1 TABLET BY MOUTH ONCE DAILY 30 tablet 11  . MELOXICAM 15 MG PO TABS Oral Take 15 mg by mouth at bedtime.     Marland Kitchen MONTELUKAST SODIUM 10 MG PO TABS Oral Take 10 mg by mouth at bedtime.     . OXYCODONE-ACETAMINOPHEN 5-325 MG PO TABS Oral Take 1-2 tablets by mouth every 4 (four) hours as needed. 40 tablet 0  . SERTRALINE HCL 100 MG PO TABS Oral Take 100 mg by mouth daily.     Marland Kitchen SYNTHROID 137 MCG PO TABS Oral Take 137 mcg by mouth daily. 30 tablet 11    Dispense as  written.  . WARFARIN SODIUM 1 MG PO TABS Oral Take 1 tablet (1 mg total) by mouth as directed. 30 tablet 0  . ZOLPIDEM TARTRATE 10 MG PO TABS  take 1 tablet by mouth at bedtime if needed 30 tablet 5  . ALBUTEROL SULFATE HFA 108 (90 BASE) MCG/ACT IN AERS Inhalation Inhale 2 puffs into the lungs every 6 (six) hours as needed. 1 Inhaler 5    BP 132/83  Pulse 110  Temp(Src) 99.3 F (37.4 C) (Oral)  Resp 28  SpO2 95%  Physical Exam  Nursing note and vitals reviewed. Constitutional: She is oriented to person, place, and time. She appears well-developed and well-nourished. No distress.  Eyes: Conjunctivae are normal. No scleral icterus.  Neck: Neck supple. No tracheal deviation present.  Cardiovascular: Regular rhythm, normal heart sounds and intact distal pulses.  Exam reveals no gallop and no friction rub.   No murmur heard. Pulmonary/Chest: Effort normal and breath sounds normal. No respiratory distress. She has no rales. She exhibits no tenderness.  Abdominal: Soft. Normal appearance and bowel sounds are normal. She exhibits no distension and no mass. There is no tenderness. There is no rebound and no guarding.       Ostomy, pink, patent, functioning, loose brown stool in bag.   Genitourinary:       No cva tenderness  Musculoskeletal: She exhibits no edema and no tenderness.  Neurological: She is alert and oriented to person, place, and time.  Skin: Skin is warm and dry. No rash noted.  Psychiatric: She has a normal mood and affect.    ED Course  Procedures (including critical care time)   Labs Reviewed  CBC  CARDIAC PANEL(CRET KIN+CKTOT+MB+TROPI)  I-STAT, CHEM 8  PROTIME-INR  HEPATIC FUNCTION PANEL   Results for orders placed during the hospital encounter of 05/28/11  CBC      Component Value Range   WBC 16.0 (*) 4.0 - 10.5 (K/uL)   RBC 3.98  3.87 - 5.11 (MIL/uL)   Hemoglobin 11.3 (*) 12.0 - 15.0 (g/dL)   HCT 16.1 (*) 09.6 - 46.0 (%)   MCV 89.4  78.0 - 100.0 (fL)   MCH  28.4  26.0 - 34.0 (pg)   MCHC 31.7  30.0 - 36.0 (g/dL)   RDW 04.5  40.9 - 81.1 (%)   Platelets 594 (*) 150 - 400 (K/uL)  CARDIAC PANEL(CRET KIN+CKTOT+MB+TROPI)      Component Value Range   Total CK 16  7 - 177 (U/L)   CK, MB 1.3  0.3 - 4.0 (ng/mL)   Troponin I <0.30  <0.30 (ng/mL)   Relative Index RELATIVE INDEX IS INVALID  0.0 - 2.5   PROTIME-INR      Component Value Range   Prothrombin Time 21.0 (*)  11.6 - 15.2 (seconds)   INR 1.78 (*) 0.00 - 1.49   HEPATIC FUNCTION PANEL      Component Value Range   Total Protein 7.5  6.0 - 8.3 (g/dL)   Albumin 2.8 (*) 3.5 - 5.2 (g/dL)   AST 25  0 - 37 (U/L)   ALT 29  0 - 35 (U/L)   Alkaline Phosphatase 76  39 - 117 (U/L)   Total Bilirubin 0.4  0.3 - 1.2 (mg/dL)   Bilirubin, Direct 0.1  0.0 - 0.3 (mg/dL)   Indirect Bilirubin 0.3  0.3 - 0.9 (mg/dL)  POCT I-STAT, CHEM 8      Component Value Range   Sodium 139  135 - 145 (mEq/L)   Potassium 3.5  3.5 - 5.1 (mEq/L)   Chloride 108  96 - 112 (mEq/L)   BUN 14  6 - 23 (mg/dL)   Creatinine, Ser 6.21  0.50 - 1.10 (mg/dL)   Glucose, Bld 308 (*) 70 - 99 (mg/dL)   Calcium, Ion 6.57  8.46 - 1.32 (mmol/L)   TCO2 22  0 - 100 (mmol/L)   Hemoglobin 12.6  12.0 - 15.0 (g/dL)   HCT 96.2  95.2 - 84.1 (%)   Ct Angio Chest W/cm &/or Wo Cm  05/28/2011  *RADIOLOGY REPORT*  Clinical Data:  Acute onset of severe left sided back pain that radiates into the left flank.  Shortness of breath.  Ileostomy 3 weeks ago.  CT ANGIOGRAPHY CHEST WITH CONTRAST  Technique:  Multidetector CT imaging of the chest was performed using the standard protocol during bolus administration of intravenous contrast.  Multiplanar CT image reconstructions including MIPs were obtained to evaluate the vascular anatomy.  Contrast: OMNIPAQUE IOHEXOL 300 MG/ML IV SOLN  Comparison:  Chest x-ray dated 05/28/2011  Findings:  There are no pulmonary emboli.  There is patchy atelectasis at both bases, more on the left than the right. Tiny left effusion  as well as tiny pericardial effusion.  Mild cardiomegaly.  No osseous abnormalities.  No hilar or mediastinal adenopathy.  Review of the MIP images confirms the above findings.  IMPRESSION:  Slight atelectasis at the bases, left greater than right.  Tiny left effusion.  No pulmonary emboli.  Original Report Authenticated By: Gwynn Burly, M.D.    Dg Chest Port 1 View  05/28/2011  *RADIOLOGY REPORT*  Clinical Data: Pain.  Shortness of breath.  Back pain.  PORTABLE CHEST - 1 VIEW  Comparison: Portal chest 05/15/2011.  Findings: Mild cardiomegaly is again noted.  Bibasilar airspace disease is worse on the left.  Mild pulmonary vascular congestion is evident.  A left pleural effusion is not excluded.  The visualized soft tissues and bony thorax are unremarkable.  IMPRESSION:  1.  Stable cardiomegaly with mild pulmonary vascular congestion. 2.  Bibasilar airspace disease, worse on the left.  Early infection is not excluded. 3.  Question left pleural effusion.  Original Report Authenticated By: Jamesetta Orleans. MATTERN, M.D.        MDM  Iv ns. Cxr. Labs.  Ct angio chest r/o pe.     Date: 05/28/2011  Rate: 104  Rhythm: sinus tachycardia  QRS Axis: normal  Intervals: normal  ST/T Wave abnormalities: nonspecific ST/T changes  Conduction Disutrbances:none  Narrative Interpretation:   Old EKG Reviewed: unchanged   Given pleuritic left back/chest pain, sob, tachycardia, ?LLL atel vs infil, 2 weeks postop, elev wbc - will rx for pna, discussed w dr Johna Sheriff - he requests admit to med  service - medicine service called to admit. No pe on ct. Pt also with sl left cva tenderness, ua added to labs.   Zosyn and vanc iv.  Discussed w triad, Dr Starr Sinclair, requests temp orders, team 3, tele.   Suzi Roots, MD 05/28/11 1512  Suzi Roots, MD 05/28/11 2952  Suzi Roots, MD 05/28/11 [phone]

## 2011-05-28 NOTE — Progress Notes (Signed)
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 Modafinil Rash    REACTION: rash    Patient Measurements:     Vital Signs: Temp: 98.3 F (36.8 C) (12/30 1510) Temp src: Axillary (12/30 1510) BP: 117/63 mmHg (12/30 1510) Pulse Rate: 108  (12/30 1510)  Labs:  [facility] 05/28/11 1218 05/28/11 1130 05/26/11 1453  HGB 12.6 11.3* --  HCT 37.0 35.6* --  PLT -- 594* --  APTT -- -- --  LABPROT -- 21.0* --  INR -- 1.78* 2.3  HEPARINUNFRC -- -- --  CREATININE 0.80 -- --  CKTOTAL -- 16 --  CKMB -- 1.3 --  TROPONINI -- <0.30 --   The CrCl is unknown because both a height and weight (above a minimum accepted value) are required for this calculation.  Medical History: Past Medical History  Diagnosis Date  . HYPOTHYROIDISM 10/06/2008  . HYPERLIPIDEMIA 10/06/2008  . DEPRESSION 02/23/2010  . HYPERTENSION 10/06/2008  . DIVERTICULITIS, ACUTE 11/10/2008  . ALLERGIC RHINITIS 07/23/2009  . Diverticulosis of colon 11/10/2008    Medications:  Scheduled:    .  HYDROmorphone (DILAUDID) injection  1 mg Intravenous Once  . HYDROmorphone  1 mg Intravenous Once  . ondansetron (ZOFRAN) IV  4 mg Intravenous Once  . piperacillin-tazobactam  3.375 g Intravenous Once  . vancomycin  1,000 mg Intravenous Once  . DISCONTD:  HYDROmorphone (DILAUDID) injection  1 mg Intramuscular Once  . DISCONTD: piperacillin-tazobactam (ZOSYN)  IV  3.375 g Intravenous Once   Infusions:    . sodium chloride 20 mL/hr (05/28/11 1136)  . 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 Per Med Rec, pt uses 1mg  tabs as directed, last taken 12/29.  Admission INR subtherapeutic. Husband reported to ER RN that they hadn't yet established a new  warfarin dosing pattern as an outpatient, but they were using 1mg  doses. Based on this info and subtherapeutic INR, will use a 2mg  dose tonight.  Goal of Therapy:  INR 2-3   Plan:  1) Warfarin 2mg  PO x1 2) F/U daily PT/INR.  Annia Belt 05/28/2011,6:21 PM

## 2011-05-29 ENCOUNTER — Ambulatory Visit: Payer: Managed Care, Other (non HMO) | Admitting: Family Medicine

## 2011-05-29 LAB — BASIC METABOLIC PANEL
CO2: 24 mEq/L (ref 19–32)
Calcium: 9.6 mg/dL (ref 8.4–10.5)
Chloride: 104 mEq/L (ref 96–112)
Creatinine, Ser: 0.79 mg/dL (ref 0.50–1.10)
Glucose, Bld: 97 mg/dL (ref 70–99)
Sodium: 135 mEq/L (ref 135–145)

## 2011-05-29 LAB — CARDIAC PANEL(CRET KIN+CKTOT+MB+TROPI): Total CK: 36 U/L (ref 7–177)

## 2011-05-29 LAB — CBC
HCT: 28.7 % — ABNORMAL LOW (ref 36.0–46.0)
MCH: 27.9 pg (ref 26.0–34.0)
MCV: 88 fL (ref 78.0–100.0)
Platelets: 462 10*3/uL — ABNORMAL HIGH (ref 150–400)
RBC: 3.26 MIL/uL — ABNORMAL LOW (ref 3.87–5.11)
WBC: 12.1 10*3/uL — ABNORMAL HIGH (ref 4.0–10.5)

## 2011-05-29 LAB — PROTIME-INR: INR: 1.96 — ABNORMAL HIGH (ref 0.00–1.49)

## 2011-05-29 MED ORDER — WARFARIN SODIUM 2 MG PO TABS
2.0000 mg | ORAL_TABLET | Freq: Once | ORAL | Status: AC
  Administered 2011-05-29: 2 mg via ORAL
  Filled 2011-05-29: qty 1

## 2011-05-29 NOTE — Progress Notes (Signed)
ANTIBIOTIC CONSULT NOTE - INITIAL  Pharmacy Consult for Vancomycin/zosyn Indication: pneumonia  Allergies  Allergen Reactions  . Modafinil Rash    REACTION: rash    Patient Measurements: Height: 5\' 9"  (175.3 cm) Weight: 174 lb 2.6 oz (79 kg) IBW/kg (Calculated) : 66.2  Adjusted Body Weight:   Vital Signs: Temp: 97.7 F (36.5 C) (12/30 2326) Temp src: Oral (12/30 2326) BP: 115/72 mmHg (12/30 2326) Pulse Rate: 100  (12/30 2326) Intake/Output from previous day:   Intake/Output from this shift:    Labs:  [facility] 05/28/11 1218 05/28/11 1130  WBC -- 16.0*  HGB 12.6 11.3*  PLT -- 594*  LABCREA -- --  CREATININE 0.80 --   Estimated Creatinine Clearance: 80.1 ml/min (by C-G formula based on Cr of 0.8). No results found for this basename: VANCOTROUGH:2,VANCOPEAK:2,VANCORANDOM:2,GENTTROUGH:2,GENTPEAK:2,GENTRANDOM:2,TOBRATROUGH:2,TOBRAPEAK:2,TOBRARND:2,AMIKACINPEAK:2,AMIKACINTROU:2,AMIKACIN:2, in the last 72 hours   Microbiology: Recent Results (from the past 720 hour(s))  MRSA PCR SCREENING     Status: Normal   Collection Time   05/14/11 10:15 PM      Component Value Range Status Comment   MRSA by PCR NEGATIVE  NEGATIVE  Final     Medical History: Past Medical History  Diagnosis Date  . HYPOTHYROIDISM 10/06/2008  . HYPERLIPIDEMIA 10/06/2008  . DEPRESSION 02/23/2010  . HYPERTENSION 10/06/2008  . DIVERTICULITIS, ACUTE 11/10/2008  . ALLERGIC RHINITIS 07/23/2009  . Diverticulosis of colon 11/10/2008    Medications:  Anti-infectives     Start     Dose/Rate Route Frequency Ordered Stop   05/29/11 0600   vancomycin (VANCOCIN) IVPB 1000 mg/200 mL premix        1,000 mg 200 mL/hr over 60 Minutes Intravenous Every 12 hours 05/28/11 2340     05/29/11 0000  piperacillin-tazobactam (ZOSYN) IVPB 3.375 g       3.375 g 12.5 mL/hr over 240 Minutes Intravenous 3 times per day 05/28/11 2340     05/28/11 1530  piperacillin-tazobactam (ZOSYN) IVPB 3.375 g       3.375 g 100 mL/hr  over 30 Minutes Intravenous  Once 05/28/11 1515 05/28/11 1625   05/28/11 1515   piperacillin-tazobactam (ZOSYN) IVPB 3.375 g  Status:  Discontinued        3.375 g 12.5 mL/hr over 240 Minutes Intravenous  Once 05/28/11 1514 05/28/11 1515   05/28/11 1515   vancomycin (VANCOCIN) IVPB 1000 mg/200 mL premix        1,000 mg 200 mL/hr over 60 Minutes Intravenous  Once 05/28/11 1514 05/28/11 1652         Assessment: Patient with orders for Vancomycin and Zosyn per pharmacy for PNA.  Goal of Therapy:  Vancomycin trough level 15-20 mcg/ml  Plan:  Vancomycin 1gm iv q12hr, Zosyn 3.375g IV Q8H infused over 4hrs.   Darlina Guys, Jacquenette Shone Crowford 05/29/2011,2:26 AM

## 2011-05-29 NOTE — Progress Notes (Signed)
UR COMPLETED  

## 2011-05-29 NOTE — Progress Notes (Signed)
Patient ID: [name], female   DOB: 09/15/1950, 60 y.o.   MRN: [id number]    Subjective: Still with some L chest pain but better. Mild SOB  Objective: Vital signs in last 24 hours: Temp:  [97.7 F (36.5 C)-99.3 F (37.4 C)] 99.1 F (37.3 C) (12/31 0454) Pulse Rate:  [100-110] 101  (12/31 0454) Resp:  [18-28] 18  (12/31 0454) BP: (111-132)/(50-83) 112/74 mmHg (12/31 0454) SpO2:  [95 %-100 %] 97 % (12/31 0454) Weight:  [174 lb 2.6 oz (79 kg)] 174 lb 2.6 oz (79 kg) (12/30 2326) Last BM Date: 05/28/11  Intake/Output from previous day:   Intake/Output this shift: Total I/O In: -  Out: 100 [Stool:100]  GI: normal findings: soft, non-tender Wound very clean.  Stoma healthy  Lab Results:   Basename 05/29/11 0325 05/28/11 1218 05/28/11 1130  WBC 12.1* -- 16.0*  HGB 9.1* 12.6 --  HCT 28.7* 37.0 --  PLT 462* -- 594*   BMET  Basename 05/29/11 0325 05/28/11 1218  NA 135 139  K 3.9 3.5  CL 104 108  CO2 24 --  GLUCOSE 97 115*  BUN 10 14  CREATININE 0.79 0.80  CALCIUM 9.6 --   PT/INR  Basename 05/29/11 0325 05/28/11 1130  LABPROT 22.7* 21.0*  INR 1.96* 1.78*   ABG No results found for this basename: PHART:2,PCO2:2,PO2:2,HCO3:2 in the last 72 hours  Studies/Results: Ct Angio Chest W/cm &/or Wo Cm  05/28/2011  *RADIOLOGY REPORT*  Clinical Data:  Acute onset of severe left sided back pain that radiates into the left flank.  Shortness of breath.  Ileostomy 3 weeks ago.  CT ANGIOGRAPHY CHEST WITH CONTRAST  Technique:  Multidetector CT imaging of the chest was performed using the standard protocol during bolus administration of intravenous contrast.  Multiplanar CT image reconstructions including MIPs were obtained to evaluate the vascular anatomy.  Contrast: OMNIPAQUE IOHEXOL 300 MG/ML IV SOLN  Comparison:  Chest x-ray dated 05/28/2011  Findings:  There are no pulmonary emboli.  There is patchy atelectasis at both bases, more on the left than the right. Tiny left  effusion as well as tiny pericardial effusion.  Mild cardiomegaly.  No osseous abnormalities.  No hilar or mediastinal adenopathy.  Review of the MIP images confirms the above findings.  IMPRESSION:  Slight atelectasis at the bases, left greater than right.  Tiny left effusion.  No pulmonary emboli.  Original Report Authenticated By: Gwynn Burly, M.D.   Dg Chest Port 1 View  05/28/2011  *RADIOLOGY REPORT*  Clinical Data: Pain.  Shortness of breath.  Back pain.  PORTABLE CHEST - 1 VIEW  Comparison: Portal chest 05/15/2011.  Findings: Mild cardiomegaly is again noted.  Bibasilar airspace disease is worse on the left.  Mild pulmonary vascular congestion is evident.  A left pleural effusion is not excluded.  The visualized soft tissues and bony thorax are unremarkable.  IMPRESSION:  1.  Stable cardiomegaly with mild pulmonary vascular congestion. 2.  Bibasilar airspace disease, worse on the left.  Early infection is not excluded. 3.  Question left pleural effusion.  Original Report Authenticated By: Jamesetta Orleans. MATTERN, M.D.    Anti-infectives: Anti-infectives     Start     Dose/Rate Route Frequency Ordered Stop   05/29/11 0600   vancomycin (VANCOCIN) IVPB 1000 mg/200 mL premix        1,000 mg 200 mL/hr over 60 Minutes Intravenous Every 12 hours 05/28/11 2340     05/29/11 0000  piperacillin-tazobactam (ZOSYN) IVPB 3.375  g       3.375 g 12.5 mL/hr over 240 Minutes Intravenous 3 times per day 05/28/11 2340     05/28/11 1530  piperacillin-tazobactam (ZOSYN) IVPB 3.375 g       3.375 g 100 mL/hr over 30 Minutes Intravenous  Once 05/28/11 1515 05/28/11 1625   05/28/11 1515   piperacillin-tazobactam (ZOSYN) IVPB 3.375 g  Status:  Discontinued        3.375 g 12.5 mL/hr over 240 Minutes Intravenous  Once 05/28/11 1514 05/28/11 1515   05/28/11 1515   vancomycin (VANCOCIN) IVPB 1000 mg/200 mL premix        1,000 mg 200 mL/hr over 60 Minutes Intravenous  Once 05/28/11 1514 05/28/11 1652           Assessment/Plan: Pneumonia. Appears somewhat improved. No apparent abdominal problems Appreciate Medicine's management.  I will follow for any abdominal/GI issues     LOS: 1 day    Aoki Wedemeyer T 05/29/2011  jh

## 2011-05-29 NOTE — Progress Notes (Addendum)
Subjective: Pt states she feels better today- decreased pleuritic pain and breathing better. Objective: Vital signs in last 24 hours: Temp:  [97.7 F (36.5 C)-99.2 F (37.3 C)] 99.2 F (37.3 C) (12/31 2154) Pulse Rate:  [99-106] 106  (12/31 2154) Resp:  [18-20] 18  (12/31 2154) BP: (108-118)/(65-76) 118/76 mmHg (12/31 2154) SpO2:  [96 %-99 %] 96 % (12/31 2154) Weight:  [79 kg (174 lb 2.6 oz)] 174 lb 2.6 oz (79 kg) (12/30 2326) Last BM Date: 05/29/11 Intake/Output from previous day:   Intake/Output this shift: Total I/O In: -  Out: 1225 [Urine:800; Stool:425]    General Appearance:    Alert, cooperative, no distress, appears stated age  Lungs:     Clear, dcreased BS at bases, respirations unlabored   Heart:    Regular rate and rhythm, S1 and S2 normal, no murmur, rub   or gallop  Abdomen:     Soft, non-tender, +BS.  Extremities:   Extremities normal, atraumatic, no cyanosis or edema  Neurologic:   CNII-XII intact, normal strength, sensation and reflexes    throughout    Weight change:   Intake/Output Summary (Last 24 hours) at 05/29/11 2309 Last data filed at 05/29/11 2154  Gross per 24 hour  Intake   1630 ml  Output   3225 ml  Net  -1595 ml    Lab Results:   [facility] 05/29/11 0325 05/28/11 1218  NA 135 139  K 3.9 3.5  CL 104 108  CO2 24 --  GLUCOSE 97 115*  BUN 10 14  CREATININE 0.79 0.80  CALCIUM 9.6 --    Basename 05/29/11 0325 05/28/11 1218 05/28/11 1130  WBC 12.1* -- 16.0*  HGB 9.1* 12.6 --  HCT 28.7* 37.0 --  PLT 462* -- 594*  MCV 88.0 -- 89.4   PT/INR  Basename 05/29/11 0325 05/28/11 1130  LABPROT 22.7* 21.0*  INR 1.96* 1.78*   ABG No results found for this basename: PHART:2,PCO2:2,PO2:2,HCO3:2 in the last 72 hours  Micro Results: No results found for this or any previous visit (from the past 240 hour(s)). Studies/Results: Ct Angio Chest W/cm &/or Wo Cm  05/28/2011  *RADIOLOGY REPORT*  Clinical Data:  Acute onset of severe left sided  back pain that radiates into the left flank.  Shortness of breath.  Ileostomy 3 weeks ago.  CT ANGIOGRAPHY CHEST WITH CONTRAST  Technique:  Multidetector CT imaging of the chest was performed using the standard protocol during bolus administration of intravenous contrast.  Multiplanar CT image reconstructions including MIPs were obtained to evaluate the vascular anatomy.  Contrast: OMNIPAQUE IOHEXOL 300 MG/ML IV SOLN  Comparison:  Chest x-ray dated 05/28/2011  Findings:  There are no pulmonary emboli.  There is patchy atelectasis at both bases, more on the left than the right. Tiny left effusion as well as tiny pericardial effusion.  Mild cardiomegaly.  No osseous abnormalities.  No hilar or mediastinal adenopathy.  Review of the MIP images confirms the above findings.  IMPRESSION:  Slight atelectasis at the bases, left greater than right.  Tiny left effusion.  No pulmonary emboli.  Original Report Authenticated By: Gwynn Burly, M.D.   Dg Chest Port 1 View  05/28/2011  *RADIOLOGY REPORT*  Clinical Data: Pain.  Shortness of breath.  Back pain.  PORTABLE CHEST - 1 VIEW  Comparison: Portal chest 05/15/2011.  Findings: Mild cardiomegaly is again noted.  Bibasilar airspace disease is worse on the left.  Mild pulmonary vascular congestion is evident.  A left  pleural effusion is not excluded.  The visualized soft tissues and bony thorax are unremarkable.  IMPRESSION:  1.  Stable cardiomegaly with mild pulmonary vascular congestion. 2.  Bibasilar airspace disease, worse on the left.  Early infection is not excluded. 3.  Question left pleural effusion.  Original Report Authenticated By: Jamesetta Orleans. MATTERN, M.D.   Medications: Scheduled Meds:   . levothyroxine  137 mcg Oral Daily  . losartan  50 mg Oral Daily  . meloxicam  15 mg Oral QHS  . montelukast  10 mg Oral QHS  . piperacillin-tazobactam (ZOSYN)  IV  3.375 g Intravenous Q8H  . sertraline  100 mg Oral Daily  . vancomycin  1,000 mg  Intravenous Q12H  . warfarin  2 mg Oral ONCE-1800   Continuous Infusions:   . sodium chloride    . 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

## 2011-05-29 NOTE — Progress Notes (Signed)
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  Medications:  Scheduled:     .  HYDROmorphone (DILAUDID) injection  1 mg Intravenous Once  . HYDROmorphone  1 mg Intravenous Once  . levothyroxine  137 mcg Oral Daily  . losartan  50 mg Oral Daily  . meloxicam  15 mg Oral QHS  . montelukast  10 mg Oral QHS  . ondansetron (ZOFRAN) IV  4 mg Intravenous Once  . piperacillin-tazobactam  3.375 g Intravenous Once  . piperacillin-tazobactam (ZOSYN)  IV  3.375 g Intravenous Q8H  . sertraline  100 mg Oral Daily  . vancomycin  1,000 mg Intravenous Once  . vancomycin  1,000 mg Intravenous Q12H  . warfarin  2 mg Oral ONCE-1800  . DISCONTD:  HYDROmorphone (DILAUDID) injection  1 mg Intramuscular Once  . 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  Pt has not yet established a stable dosing regimen for warfarin.  INR 1.96 following 2mg  dose last night and 1mg  tablet on Saturday. No drug-drug interactions noted. No bleeding/complications reported.  Since INR nearly therapeutic, will not recommend adding bridge therapy at this time.  Goal of Therapy:  INR 2-3   Plan:  1) Repeat warfarin 2mg  PO x1 2) F/U daily PT/INR.  Clance Boll 05/29/2011,10:08 AM

## 2011-05-30 LAB — CBC
Hemoglobin: 9.1 g/dL — ABNORMAL LOW (ref 12.0–15.0)
MCH: 28.6 pg (ref 26.0–34.0)
MCV: 89 fL (ref 78.0–100.0)
Platelets: 454 10*3/uL — ABNORMAL HIGH (ref 150–400)
RBC: 3.18 MIL/uL — ABNORMAL LOW (ref 3.87–5.11)

## 2011-05-30 MED ORDER — KETOROLAC TROMETHAMINE 30 MG/ML IJ SOLN: 30.0000 mg | Freq: Every day | INTRAMUSCULAR | Status: AC | PRN

## 2011-05-30 MED ORDER — WARFARIN SODIUM 3 MG PO TABS
3.0000 mg | ORAL_TABLET | Freq: Once | ORAL | Status: AC
  Administered 2011-05-30: 3 mg via ORAL
  Filled 2011-05-30: qty 1

## 2011-05-30 NOTE — Progress Notes (Signed)
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  Medications:  Scheduled:     . levothyroxine  137 mcg Oral Daily  . losartan  50 mg Oral Daily  . meloxicam  15 mg Oral QHS  . montelukast  10 mg Oral QHS  . piperacillin-tazobactam (ZOSYN)  IV  3.375 g Intravenous Q8H  . sertraline  100 mg Oral Daily  . vancomycin  1,000 mg Intravenous Q12H  . 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  Pt has not yet established a stable dosing regimen for warfarin.  INR unchanged (1.95)after 1,2,2 mg doses. No drug-drug interactions noted. No bleeding/complications reported.  Since INR nearly therapeutic, will not recommend adding bridge therapy at this time.  Goal of Therapy:  INR 2-3   Plan:  1)Boost with 3 mg Coumadin today. 2) F/U daily PT/INR.  Chilton Si, Dorathy Stallone L 05/30/2011,12:25 PM

## 2011-05-30 NOTE — Progress Notes (Signed)
Subjective: Pt states she feels better today- still some pleuritic pain, but  breathing better overall. Objective: Vital signs in last 24 hours: Temp:  [98.2 F (36.8 C)-99 F (37.2 C)] 98.4 F (36.9 C) (01/01 2205) Pulse Rate:  [88-94] 88  (01/01 2205) Resp:  [18] 18  (01/01 2205) BP: (119-126)/(76-78) 119/76 mmHg (01/01 2205) SpO2:  [91 %-96 %] 94 % (01/01 2205) Last BM Date: 05/30/11 Intake/Output from previous day: 12/31 0701 - 01/01 0700 In: 2830 [P.O.:480; I.V.:1800; IV Piggyback:550] Out: 3225 [Urine:1600; Stool:1625] Intake/Output this shift: Total I/O In: 50 [IV Piggyback:50] Out: 1175 [Urine:200; Stool:975]    General Appearance:    Alert, cooperative, no distress, appears stated age  Lungs:     Clear, decreased BS at bases, respirations unlabored   Heart:    Regular rate and rhythm, S1 and S2 normal, no murmur, rub   or gallop  Abdomen:     Soft, non-tender, +BS.  Extremities:   Extremities normal, atraumatic, no cyanosis or edema  Neurologic:   CNII-XII intact, normal strength, sensation and reflexes    throughout    Weight change:   Intake/Output Summary (Last 24 hours) at 05/30/11 2219 Last data filed at 05/30/11 2217  Gross per 24 hour  Intake   2505 ml  Output   3925 ml  Net  -1420 ml    Lab Results:   [facility] 05/29/11 0325 05/28/11 1218  NA 135 139  K 3.9 3.5  CL 104 108  CO2 24 --  GLUCOSE 97 115*  BUN 10 14  CREATININE 0.79 0.80  CALCIUM 9.6 --    Basename 05/30/11 0500 05/29/11 0325  WBC 10.0 12.1*  HGB 9.1* 9.1*  HCT 28.3* 28.7*  PLT 454* 462*  MCV 89.0 88.0   PT/INR  Basename 05/30/11 0500 05/29/11 0325  LABPROT 22.6* 22.7*  INR 1.95* 1.96*   ABG No results found for this basename: PHART:2,PCO2:2,PO2:2,HCO3:2 in the last 72 hours  Micro Results: No results found for this or any previous visit (from the past 240 hour(s)). Studies/Results: No results found. Medications: Scheduled Meds:    . levothyroxine  137 mcg  Oral Daily  . losartan  50 mg Oral Daily  . meloxicam  15 mg Oral QHS  . montelukast  10 mg Oral QHS  . piperacillin-tazobactam (ZOSYN)  IV  3.375 g Intravenous Q8H  . sertraline  100 mg Oral Daily  . vancomycin  1,000 mg Intravenous Q12H  . warfarin  3 mg Oral ONCE-1800   Continuous Infusions:    . sodium chloride     PRN Meds:.acetaminophen, acetaminophen, albuterol, bisacodyl, guaiFENesin-dextromethorphan, HYDROmorphone, ondansetron (ZOFRAN) IV, ondansetron, oxyCODONE-acetaminophen, zolpidem Assessment/Plan: Patient Active Hospital Problem List: Hospital-acquired pneumonia (05/28/2011) -improving on current abx,leukocytosis resolved.Will dc vanc and follow. -still with pleuritic pain dose of toradol added x1 with NSAIDs caution as pt also on coumadin.    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

## 2011-05-30 NOTE — Progress Notes (Signed)
Subjective: No complaints  Objective: Vital signs in last 24 hours: Temp:  [98 F (36.7 C)-99.2 F (37.3 C)] 99 F (37.2 C) (01/01 0508) Pulse Rate:  [94-106] 94  (01/01 0508) Resp:  [18] 18  (01/01 0508) BP: (108-122)/(65-78) 122/78 mmHg (01/01 0508) SpO2:  [95 %-99 %] 95 % (01/01 0508) Last BM Date: 05/30/11  Intake/Output from previous day: 12/31 0701 - 01/01 0700 In: 2830 [P.O.:480; I.V.:1800; IV Piggyback:550] Out: 3225 [Urine:1600; Stool:1625] Intake/Output this shift: Total I/O In: -  Out: 200 [Stool:200]  General appearance: alert, cooperative and no distress GI: stable, ostomy functioning well.  Lab Results:   Basename 05/30/11 0500 05/29/11 0325  WBC 10.0 12.1*  HGB 9.1* 9.1*  HCT 28.3* 28.7*  PLT 454* 462*   BMET  Basename 05/29/11 0325 05/28/11 1218  NA 135 139  K 3.9 3.5  CL 104 108  CO2 24 --  GLUCOSE 97 115*  BUN 10 14  CREATININE 0.79 0.80  CALCIUM 9.6 --   PT/INR  Basename 05/30/11 0500 05/29/11 0325  LABPROT 22.6* 22.7*  INR 1.95* 1.96*   ABG No results found for this basename: PHART:2,PCO2:2,PO2:2,HCO3:2 in the last 72 hours  Studies/Results: Ct Angio Chest W/cm &/or Wo Cm  05/28/2011  *RADIOLOGY REPORT*  Clinical Data:  Acute onset of severe left sided back pain that radiates into the left flank.  Shortness of breath.  Ileostomy 3 weeks ago.  CT ANGIOGRAPHY CHEST WITH CONTRAST  Technique:  Multidetector CT imaging of the chest was performed using the standard protocol during bolus administration of intravenous contrast.  Multiplanar CT image reconstructions including MIPs were obtained to evaluate the vascular anatomy.  Contrast: OMNIPAQUE IOHEXOL 300 MG/ML IV SOLN  Comparison:  Chest x-ray dated 05/28/2011  Findings:  There are no pulmonary emboli.  There is patchy atelectasis at both bases, more on the left than the right. Tiny left effusion as well as tiny pericardial effusion.  Mild cardiomegaly.  No osseous abnormalities.   No hilar or mediastinal adenopathy.  Review of the MIP images confirms the above findings.  IMPRESSION:  Slight atelectasis at the bases, left greater than right.  Tiny left effusion.  No pulmonary emboli.  Original Report Authenticated By: Gwynn Burly, M.D.    Anti-infectives: Anti-infectives     Start     Dose/Rate Route Frequency Ordered Stop   05/29/11 0600   vancomycin (VANCOCIN) IVPB 1000 mg/200 mL premix        1,000 mg 200 mL/hr over 60 Minutes Intravenous Every 12 hours 05/28/11 2340     05/29/11 0000   piperacillin-tazobactam (ZOSYN) IVPB 3.375 g        3.375 g 12.5 mL/hr over 240 Minutes Intravenous 3 times per day 05/28/11 2340     05/28/11 1530   piperacillin-tazobactam (ZOSYN) IVPB 3.375 g        3.375 g 100 mL/hr over 30 Minutes Intravenous  Once 05/28/11 1515 05/28/11 1625   05/28/11 1515   piperacillin-tazobactam (ZOSYN) IVPB 3.375 g  Status:  Discontinued        3.375 g 12.5 mL/hr over 240 Minutes Intravenous  Once 05/28/11 1514 05/28/11 1515   05/28/11 1515   vancomycin (VANCOCIN) IVPB 1000 mg/200 mL premix        1,000 mg 200 mL/hr over 60 Minutes Intravenous  Once 05/28/11 1514 05/28/11 1652          Assessment/Plan: s/p  no new issues, surgery following along  LOS: 2 days  [name] [name] 05/30/2011

## 2011-05-31 LAB — BASIC METABOLIC PANEL
BUN: 9 mg/dL (ref 6–23)
Chloride: 104 mEq/L (ref 96–112)
Glucose, Bld: 96 mg/dL (ref 70–99)
Potassium: 3.4 mEq/L — ABNORMAL LOW (ref 3.5–5.1)

## 2011-05-31 MED ORDER — POTASSIUM CHLORIDE CRYS ER 20 MEQ PO TBCR
40.0000 meq | EXTENDED_RELEASE_TABLET | Freq: Once | ORAL | Status: AC
  Administered 2011-05-31: 40 meq via ORAL
  Filled 2011-05-31: qty 2

## 2011-05-31 MED ORDER — WARFARIN SODIUM 3 MG PO TABS
3.0000 mg | ORAL_TABLET | Freq: Once | ORAL | Status: AC
  Administered 2011-05-31: 3 mg via ORAL
  Filled 2011-05-31: qty 1

## 2011-05-31 MED ORDER — LEVOFLOXACIN 750 MG PO TABS
750.0000 mg | ORAL_TABLET | Freq: Every day | ORAL | Status: DC
  Administered 2011-06-01: 750 mg via ORAL
  Filled 2011-05-31: qty 1

## 2011-05-31 NOTE — Progress Notes (Signed)
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 Modafinil Rash    REACTION: rash    Patient Measurements: Height: 5\' 9"  (175.3 cm) Weight: 174 lb 2.6 oz (79 kg) IBW/kg (Calculated) : 66.2   Vital Signs: Temp: 98.2 F (36.8 C) (01/02 0621) Temp src: Oral (01/02 0621) BP: 120/75 mmHg (01/02 0621) Pulse Rate: 84  (01/02 0621)  Labs:  Basename 05/31/11 0451 05/30/11 0500 05/29/11 0325 05/28/11 1940 05/28/11 1218 05/28/11 1130  HGB -- 9.1* 9.1* -- -- --  HCT -- 28.3* 28.7* -- 37.0 --  PLT -- 454* 462* -- -- 594*  APTT -- -- -- -- -- --  LABPROT 26.1* 22.6* 22.7* -- -- --  INR 2.35* 1.95* 1.96* -- -- --  HEPARINUNFRC -- -- -- -- -- --  CREATININE 0.71 -- 0.79 -- 0.80 --  CKTOTAL -- -- 36 15 -- 16  CKMB -- -- 1.5 1.0 -- 1.3  TROPONINI -- -- <0.30 <0.30 -- <0.30   Estimated Creatinine Clearance: 80.1 ml/min (by C-G formula based on Cr of 0.71).  Medications:  Scheduled:    . levothyroxine  137 mcg Oral Daily  . losartan  50 mg Oral Daily  . meloxicam  15 mg Oral QHS  . montelukast  10 mg Oral QHS  . piperacillin-tazobactam (ZOSYN)  IV  3.375 g Intravenous Q8H  . sertraline  100 mg Oral Daily  . warfarin  3 mg Oral ONCE-1800  . DISCONTD: vancomycin  1,000 mg Intravenous Q12H   Infusions:    . 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 Pt has not yet established a stable dosing regimen for warfarin (using 1mg  tabs at home). INR today is therapeutic. No bleeding events reported in chart notes. So far (in hospital) has had: warfarin 2mg , 2mg , 3mg .   Goal of  Therapy:  INR 2-3   Plan:  1) Warfarin 3mg  PO x1 2) F/U daily INR trend.  Annia Belt 05/31/2011,10:24 AM

## 2011-05-31 NOTE — Progress Notes (Signed)
Patient ambulated on room air and oxygen saturation remained between 94 and 95%.

## 2011-05-31 NOTE — Telephone Encounter (Signed)
Ambien last filled 04-25-10, #30 with 3 refills

## 2011-05-31 NOTE — Progress Notes (Signed)
Patient ID: [name], female   DOB: 18-Aug-1950, 61 y.o.   MRN: [id number]    Subjective: Feels much better.  No SOB or CP today.  No abd or GI C/O  Objective: Vital signs in last 24 hours: Temp:  [98.2 F (36.8 C)-98.4 F (36.9 C)] 98.2 F (36.8 C) (01/02 0621) Pulse Rate:  [84-94] 84  (01/02 0621) Resp:  [18] 18  (01/02 0621) BP: (119-126)/(75-77) 120/75 mmHg (01/02 0621) SpO2:  [91 %-96 %] 95 % (01/02 0621) Last BM Date: 05/30/11  Intake/Output from previous day: 01/01 0701 - 01/02 0700 In: 2425 [P.O.:600; I.V.:1725; IV Piggyback:100] Out: 4675 [Urine:1950; Stool:2725] Intake/Output this shift:    General appearance: alert and no distress Resp: rales base - left, mild GI: normal findings: soft, non-tender  Lab Results:   Basename 05/30/11 0500 05/29/11 0325  WBC 10.0 12.1*  HGB 9.1* 9.1*  HCT 28.3* 28.7*  PLT 454* 462*   BMET  Basename 05/31/11 0451 05/29/11 0325  NA 137 135  K 3.4* 3.9  CL 104 104  CO2 24 24  GLUCOSE 96 97  BUN 9 10  CREATININE 0.71 0.79  CALCIUM 9.1 9.6   PT/INR  Basename 05/31/11 0451 05/30/11 0500  LABPROT 26.1* 22.6*  INR 2.35* 1.95*   ABG No results found for this basename: PHART:2,PCO2:2,PO2:2,HCO3:2 in the last 72 hours  Studies/Results: No results found.  Anti-infectives: Anti-infectives     Start     Dose/Rate Route Frequency Ordered Stop   05/29/11 0600   vancomycin (VANCOCIN) IVPB 1000 mg/200 mL premix  Status:  Discontinued        1,000 mg 200 mL/hr over 60 Minutes Intravenous Every 12 hours 05/28/11 2340 05/30/11 2227   05/29/11 0000  piperacillin-tazobactam (ZOSYN) IVPB 3.375 g       3.375 g 12.5 mL/hr over 240 Minutes Intravenous 3 times per day 05/28/11 2340     05/28/11 1530  piperacillin-tazobactam (ZOSYN) IVPB 3.375 g       3.375 g 100 mL/hr over 30 Minutes Intravenous  Once 05/28/11 1515 05/28/11 1625   05/28/11 1515   piperacillin-tazobactam (ZOSYN) IVPB 3.375 g  Status:  Discontinued        3.375 g 12.5 mL/hr over 240 Minutes Intravenous  Once 05/28/11 1514 05/28/11 1515   05/28/11 1515   vancomycin (VANCOCIN) IVPB 1000 mg/200 mL premix        1,000 mg 200 mL/hr over 60 Minutes Intravenous  Once 05/28/11 1514 05/28/11 1652          Assessment/Plan: PNA, appears improved.  No acute surgical issues. Appreciate medicine's care     LOS: 3 days    [name] 05/31/2011

## 2011-05-31 NOTE — Progress Notes (Signed)
Subjective: "im feeling much better today". Denies discomfort. Reports pain in left shoulder early am controlled with med.  Objective: Vital signs Filed Vitals:   05/30/11 1000 05/30/11 1345 05/30/11 2205 05/31/11 0621  BP:  126/77 119/76 120/75  Pulse:  94 88 84  Temp:  98.2 F (36.8 C) 98.4 F (36.9 C) 98.2 F (36.8 C)  TempSrc:  Oral Oral Oral  Resp:  18 18 18   Height:      Weight:      SpO2: 91% 96% 94% 95%   Weight change:  Last BM Date: 05/31/11  Intake/Output from previous day: 01/01 0701 - 01/02 0700 In: 2425 [P.O.:600; I.V.:1725; IV Piggyback:100] Out: 4675 [Urine:1950; Stool:2725]     Physical Exam: General: Alert, awake, oriented x3, in no acute distress. Sitting up in bed. Smiling.  HEENT: No bruits, no goiter.Mucus membranes mouth moist/pink. PERRL Heart: Regular rate and rhythm, without murmurs, rubs, gallops. Lungs:Normal effort. Fine crackles left base, otherwise clear to auscultation bilaterally. Abdomen: Soft, nontender, nondistended, positive bowel sounds. Dsg dry and intact. Ostomy with brown loose stool. Extremities: No clubbing cyanosis or edema with positive pedal pulses. Neuro: Grossly intact, nonfocal. Speech clear.     Lab Results: Basic Metabolic Panel:  Basename 05/31/11 0451 05/29/11 0325  NA 137 135  K 3.4* 3.9  CL 104 104  CO2 24 24  GLUCOSE 96 97  BUN 9 10  CREATININE 0.71 0.79  CALCIUM 9.1 9.6  MG -- --  PHOS -- --   Liver Function Tests: No results found for this basename: AST:2,ALT:2,ALKPHOS:2,BILITOT:2,PROT:2,ALBUMIN:2 in the last 72 hours No results found for this basename: LIPASE:2,AMYLASE:2 in the last 72 hours No results found for this basename: AMMONIA:2 in the last 72 hours CBC:  Basename 05/30/11 0500 05/29/11 0325  WBC 10.0 12.1*  NEUTROABS -- --  HGB 9.1* 9.1*  HCT 28.3* 28.7*  MCV 89.0 88.0  PLT 454* 462*   Cardiac Enzymes:  Basename 05/29/11 0325 05/28/11 1940  CKTOTAL 36 15  CKMB 1.5 1.0    CKMBINDEX -- --  TROPONINI <0.30 <0.30   BNP: No results found for this basename: PROBNP:3 in the last 72 hours D-Dimer: No results found for this basename: DDIMER:2 in the last 72 hours CBG: No results found for this basename: GLUCAP:6 in the last 72 hours Hemoglobin A1C: No results found for this basename: HGBA1C in the last 72 hours Fasting Lipid Panel: No results found for this basename: CHOL,HDL,LDLCALC,TRIG,CHOLHDL,LDLDIRECT in the last 72 hours Thyroid Function Tests: No results found for this basename: TSH,T4TOTAL,FREET4,T3FREE,THYROIDAB in the last 72 hours Anemia Panel: No results found for this basename: VITAMINB12,FOLATE,FERRITIN,TIBC,IRON,RETICCTPCT in the last 72 hours Coagulation:  Basename 05/31/11 0451 05/30/11 0500  LABPROT 26.1* 22.6*  INR 2.35* 1.95*   Urine Drug Screen: Drugs of Abuse  No results found for this basename: labopia, cocainscrnur, labbenz, amphetmu, thcu, labbarb    Alcohol Level: No results found for this basename: ETH:2 in the last 72 hours Urinalysis:  Misc. Labs:  No results found for this or any previous visit (from the past 240 hour(s)).  Studies/Results: No results found.  Medications: Scheduled Meds:   . levothyroxine  137 mcg Oral Daily  . losartan  50 mg Oral Daily  . meloxicam  15 mg Oral QHS  . montelukast  10 mg Oral QHS  . piperacillin-tazobactam (ZOSYN)  IV  3.375 g Intravenous Q8H  . sertraline  100 mg Oral Daily  . warfarin  3 mg Oral ONCE-1800  . warfarin  3 mg Oral ONCE-1800  . DISCONTD: vancomycin  1,000 mg Intravenous Q12H   Continuous Infusions:   . sodium chloride 75 mL/hr at 05/31/11 1055   PRN Meds:.acetaminophen, acetaminophen, albuterol, bisacodyl, guaiFENesin-dextromethorphan, HYDROmorphone, ketorolac, ondansetron (ZOFRAN) IV, ondansetron, oxyCODONE-acetaminophen, zolpidem  Assessment/Plan:  Active Problems: Hospital-acquired pneumonia (05/28/2011) Zosyn day #4. Vanc dc'd 05/30/11.  Afefrile.  Zosyn day#4. Will change to Levaquin. Sats range 94-97% room air. Will check after ambulation. -still with pleuritic pain dose of toradol added x1 with NSAIDs caution as pt also on coumadin. 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 S/p ilestomy/ abdominal surgeries- per [facility] . Dispo. Likely discharge 24-48hrs with home health for dsg changes and INR draw.  LOS: 2 days       LOS: 3 days   [facility] M 05/31/2011, 1:24 PM

## 2011-05-31 NOTE — Progress Notes (Signed)
Patient seen and examined. Agree with [name] Plan note. Patient feels much better. Pain is significantly less. Breathing is better. Vital signs stable. Lungs reveal a few crackles at baseline. But otherwise clear.  Pneumonia and pleuritic pain is better. Possible discharge tomorrow.

## 2011-05-31 NOTE — Telephone Encounter (Signed)
Refill for 6 months. 

## 2011-06-01 LAB — BASIC METABOLIC PANEL
BUN: 8 mg/dL (ref 6–23)
CO2: 25 mEq/L (ref 19–32)
Chloride: 104 mEq/L (ref 96–112)
Creatinine, Ser: 0.61 mg/dL (ref 0.50–1.10)
Glucose, Bld: 89 mg/dL (ref 70–99)

## 2011-06-01 LAB — CBC
HCT: 27.7 % — ABNORMAL LOW (ref 36.0–46.0)
MCV: 88.2 fL (ref 78.0–100.0)
RBC: 3.14 MIL/uL — ABNORMAL LOW (ref 3.87–5.11)
WBC: 7.2 10*3/uL (ref 4.0–10.5)

## 2011-06-01 MED ORDER — WARFARIN SODIUM 1 MG PO TABS: 2.0000 mg | ORAL_TABLET | Freq: Every day | ORAL | Status: DC

## 2011-06-01 MED ORDER — LEVOFLOXACIN 750 MG PO TABS: 750.0000 mg | ORAL_TABLET | Freq: Every day | ORAL | Status: AC

## 2011-06-01 MED ORDER — OXYCODONE-ACETAMINOPHEN 5-325 MG PO TABS: 1.0000 | ORAL_TABLET | ORAL | Status: DC | PRN

## 2011-06-01 MED ORDER — GUAIFENESIN-DM 100-10 MG/5ML PO SYRP: 5.0000 mL | ORAL_SOLUTION | ORAL | Status: DC | PRN

## 2011-06-01 MED ORDER — WARFARIN SODIUM 2 MG PO TABS
2.0000 mg | ORAL_TABLET | Freq: Once | ORAL | Status: DC
  Filled 2011-06-01: qty 1

## 2011-06-01 NOTE — Discharge Summary (Signed)
Physician Discharge Summary  Patient ID: [name] MRN: [id number] DOB/AGE: 1950/12/29 61 y.o.  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 Qty: 118 mL, Refills: 0    levofloxacin (LEVAQUIN) 750 MG tablet Take 1 tablet (750 mg total) by mouth daily. Qty: 10 tablet, Refills: 0      CONTINUE these medications which have CHANGED   Details  oxyCODONE-acetaminophen (PERCOCET) 5-325 MG per tablet Take 1-2 tablets by mouth every 4 (four) hours as needed. Qty: 24 tablet, Refills: 0    warfarin (COUMADIN) 1 MG tablet Take 2 tablets (2 mg total) by mouth daily. Qty: 4 tablet, Refills: 0      CONTINUE these medications which have NOT CHANGED   Details  acetaminophen (TYLENOL) 500 MG tablet Take 1,000 mg by mouth every 6 (six) hours as needed. FOR PAIN     Bioflavonoid Products (VITAMIN C) CHEW Chew 1 tablet by mouth daily.      losartan (COZAAR) 50 MG tablet TAKE 1 TABLET BY MOUTH ONCE DAILY Qty: 30 tablet, Refills: 11    meloxicam (MOBIC) 15 MG tablet Take 15 mg by mouth at bedtime.     montelukast (SINGULAIR) 10 MG tablet Take 10 mg by mouth at bedtime.     sertraline (ZOLOFT) 100 MG tablet Take 100 mg by mouth daily.     SYNTHROID 137 MCG tablet Take 137 mcg by mouth daily. Qty: 30 tablet, Refills: 11   Associated Diagnoses: Thyroid activity decreased    albuterol (PROVENTIL HFA) 108 (90 BASE) MCG/ACT inhaler Inhale 2 puffs into the lungs every 6 (six) hours as needed. Qty: 1 Inhaler, Refills: 5   Associated Diagnoses: Wheezing    zolpidem (AMBIEN) 10  MG tablet take 1 tablet by mouth at bedtime if needed Qty: 30 tablet, Refills: 5         Disposition and Follow-up: pt medically stable and ready for discharge to home. Will follow up with PCP 1 week. Will need f/u chest xray 6-8 weeks  Consults:  general surgery  Physical exam:  General: Alert, awake, oriented x3, in no acute distress. Sitting up in bed. Smiling.  HEENT: No bruits, no goiter.Mucus membranes mouth moist/pink. PERRL  Heart: Regular rate and rhythm, without murmurs, rubs, gallops.  Lungs:Normal effort. Fine crackles left base, otherwise clear to auscultation bilaterally.  Abdomen: Soft, nontender, nondistended, positive bowel sounds. Dsg dry and intact. Ostomy with brown loose stool.  Extremities: No clubbing cyanosis or edema with positive pedal pulses.  Neuro: Grossly intact, nonfocal. Speech clear.     Significant Diagnostic Studies:  Ct Angio Chest W/cm &/or Wo Cm  05/28/2011  *RADIOLOGY REPORT*  Clinical Data:  Acute onset of severe left sided back pain that radiates into the left flank.  Shortness of breath.  Ileostomy 3 weeks ago.  CT ANGIOGRAPHY CHEST WITH CONTRAST  Technique:  Multidetector CT imaging of the chest was performed using the standard protocol during bolus administration of intravenous contrast.  Multiplanar CT image reconstructions including MIPs were obtained to evaluate the vascular anatomy.  Contrast: OMNIPAQUE IOHEXOL 300 MG/ML IV SOLN  Comparison:  Chest x-ray dated 05/28/2011  Findings:  There are no pulmonary emboli.  There is patchy atelectasis at both bases, more on the left than the right. Tiny left effusion as well as tiny pericardial effusion.  Mild cardiomegaly.  No osseous abnormalities.  No hilar or mediastinal adenopathy.  Review of the MIP images confirms the above findings.  IMPRESSION:  Slight atelectasis at the bases, left greater than right.  Tiny left effusion.  No pulmonary emboli.  Original Report Authenticated By: Gwynn Burly, M.D.   Dg Chest Port 1 View  05/28/2011  *RADIOLOGY REPORT*  Clinical Data: Pain.  Shortness of breath.  Back pain.  PORTABLE CHEST - 1 VIEW  Comparison: Portal chest 05/15/2011.  Findings: Mild cardiomegaly is again noted.  Bibasilar airspace disease is worse on the left.  Mild pulmonary vascular congestion is evident.  A left pleural effusion is not excluded.  The visualized soft tissues and bony thorax are unremarkable.  IMPRESSION:  1.  Stable cardiomegaly with mild pulmonary vascular congestion. 2.  Bibasilar airspace disease, worse on the left.  Early infection is not excluded. 3.  Question left pleural effusion.  Original Report Authenticated By: Jamesetta Orleans. MATTERN, M.D.    Labs Reviewed  CBC - Abnormal; Notable for the following:    WBC 16.0 (*)    Hemoglobin 11.3 (*)    HCT 35.6 (*)    Platelets 594 (*)    All other components within normal limits  PROTIME-INR - Abnormal; Notable for the following:    Prothrombin Time 21.0 (*)    INR 1.78 (*)    All other components within normal limits  HEPATIC FUNCTION PANEL - Abnormal; Notable for the following:    Albumin 2.8 (*)    All other components within normal limits  POCT I-STAT, CHEM 8 - Abnormal; Notable for the following:    Glucose, Bld 115 (*)    All other components within normal limits  URINALYSIS, ROUTINE W REFLEX MICROSCOPIC - Abnormal; Notable for the following:    Specific Gravity, Urine >1.046 (*)    Hgb urine dipstick LARGE (*)    All other components within normal limits  PROTIME-INR - Abnormal; Notable for the following:    Prothrombin Time 22.7 (*)    INR 1.96 (*)    All other components within normal limits  CBC - Abnormal; Notable for the following:    WBC 12.1 (*)    RBC 3.26 (*)    Hemoglobin 9.1 (*)    HCT 28.7 (*)    RDW 15.6 (*)    Platelets 462 (*)    All other components within normal limits  PROTIME-INR - Abnormal; Notable for the following:    Prothrombin Time 22.6 (*)    INR 1.95  (*)    All other components within normal limits  CBC - Abnormal; Notable for the following:    RBC 3.18 (*)    Hemoglobin 9.1 (*)    HCT 28.3 (*)    RDW 16.0 (*)    Platelets 454 (*)    All other components within normal limits  PROTIME-INR - Abnormal; Notable for the following:    Prothrombin Time 26.1 (*)    INR 2.35 (*)    All other components within normal limits  BASIC METABOLIC PANEL - Abnormal; Notable for the following:    Potassium 3.4 (*)    All other components within normal limits  PROTIME-INR - Abnormal; Notable for the following:    Prothrombin Time 27.8 (*)  INR 2.54 (*)    All other components within normal limits  CBC - Abnormal; Notable for the following:    RBC 3.14 (*)    Hemoglobin 9.0 (*)    HCT 27.7 (*)    RDW 15.8 (*)    Platelets 459 (*)    All other components within normal limits  CARDIAC PANEL(CRET KIN+CKTOT+MB+TROPI)  URINE MICROSCOPIC-ADD ON  CARDIAC PANEL(CRET KIN+CKTOT+MB+TROPI)  CARDIAC PANEL(CRET KIN+CKTOT+MB+TROPI)  BASIC METABOLIC PANEL  BASIC METABOLIC PANEL        Ct Angio Chest W/cm &/or Wo Cm  05/28/2011  *RADIOLOGY REPORT*  Clinical Data:  Acute onset of severe left sided back pain that radiates into the left flank.  Shortness of breath.  Ileostomy 3 weeks ago.  CT ANGIOGRAPHY CHEST WITH CONTRAST  Technique:  Multidetector CT imaging of the chest was performed using the standard protocol during bolus administration of intravenous contrast.  Multiplanar CT image reconstructions including MIPs were obtained to evaluate the vascular anatomy.  Contrast: OMNIPAQUE IOHEXOL 300 MG/ML IV SOLN  Comparison:  Chest x-ray dated 05/28/2011  Findings:  There are no pulmonary emboli.  There is patchy atelectasis at both bases, more on the left than the right. Tiny left effusion as well as tiny pericardial effusion.  Mild cardiomegaly.  No osseous abnormalities.  No hilar or mediastinal adenopathy.  Review of the MIP images confirms the above  findings.  IMPRESSION:  Slight atelectasis at the bases, left greater than right.  Tiny left effusion.  No pulmonary emboli.  Original Report Authenticated By: Gwynn Burly, M.D.   Ct Abdomen Pelvis W Contrast  05/14/2011  *RADIOLOGY REPORT*  Clinical Data: Recent surgery, abdominal pain  CT ABDOMEN AND PELVIS WITH CONTRAST  Technique:  Multidetector CT imaging of the abdomen and pelvis was performed following the standard protocol during bolus administration of intravenous contrast.  Contrast: OMNIPAQUE IOHEXOL 300 MG/ML IV SOLN  Comparison: 05/09/2011 CT  Findings: Heart size upper normal limits to mildly enlarged. Bibasilar linear opacities.  Multiple hypodensities within the liver are unchanged. Unremarkable spleen, pancreas, adrenal glands.  Symmetric renal enhancement.  There are nonobstructing left renal stones. Nonobstructing lower pole right renal stones as well.  Too small further characterize bilateral renal hypodensities.  The gallbladder is distended.  No radiodense gallstones.  No biliary ductal dilatation.  Interval increased free intraperitoneal air and fluid.  Bowel anastomotic suture in the pelvis status post subtotal colectomy. Bowel anastomotic suture noted in the right mid abdomen.  There are thickened/inflamed appearing loops of small bowel near the anastomosis in the right mid abdomen.  Prominent lymph nodes within the right lower quadrant, measuring up to 1.3 cm short axis.  Small fluid pocket at the ileostomy takedown site within the subcutaneous fat superficial to the peritoneum is nonspecific.  Thrombus within the SMV.  The portal vein and splenic vein are patent.  The SMA, celiac axis, and IMA are patent.  Scattered atherosclerosis.  Thin-walled bladder.  Absent uterus.  No adnexal mass.  No acute osseous abnormality.  IMPRESSION: Interval perforation or anastamotic leak, with increased free intraperitoneal air and fluid.  Thickened loop of small bowel within the right lower  abdomen. Given the SMV thrombus, this may represent ischemic changes.  Enlarged ileocolic lymph node measuring 1.3 cm short axis.  Given the adjacent inflammatory changes, may simply be reactive. However, if the patient underwent a colectomy for malignancy, metastatic involvement of this lymph node should also be considered.  Critical Value/emergent results were  called by telephone at the time of interpretation on 05/14/2011  at 06:40 a.m.  to the surgeon, Dr. Daphine Deutscher, who verbally acknowledged these results.  Original Report Authenticated By: Waneta Martins, M.D.   Dg Chest Port 1 View  05/28/2011  *RADIOLOGY REPORT*  Clinical Data: Pain.  Shortness of breath.  Back pain.  PORTABLE CHEST - 1 VIEW  Comparison: Portal chest 05/15/2011.  Findings: Mild cardiomegaly is again noted.  Bibasilar airspace disease is worse on the left.  Mild pulmonary vascular congestion is evident.  A left pleural effusion is not excluded.  The visualized soft tissues and bony thorax are unremarkable.  IMPRESSION:  1.  Stable cardiomegaly with mild pulmonary vascular congestion. 2.  Bibasilar airspace disease, worse on the left.  Early infection is not excluded. 3.  Question left pleural effusion.  Original Report Authenticated By: Jamesetta Orleans. MATTERN, M.D.   Dg Chest Port 1 View  05/15/2011  *RADIOLOGY REPORT*  Clinical Data: Ventilated patient, evaluate endotracheal tube position.  PORTABLE CHEST - 1 VIEW  Comparison: 05/14/2011  Findings: Endotracheal tube tip is positioned 3 cm proximal to the carina.  NG tube descends into the abdomen, tip not visualized. Right IJ central venous catheter tip projects over the distal SVC.  Otherwise stable chest, with perihilar fullness and interstitial prominence.  Retrocardiac opacity.  No pneumothorax.  IMPRESSION: Stable support devices including endotracheal tube and right IJ catheter.  Perihilar fullness and interstitial prominence is similar to prior, suggests edema.  Retrocardiac  opacity is nonspecific; atelectasis versus infiltrate.  Original Report Authenticated By: Waneta Martins, M.D.   Dg Chest Port 1 View  05/14/2011  *RADIOLOGY REPORT*  Clinical Data: Evaluate ET tube placement and right IJ placement  PORTABLE CHEST - 1 VIEW  Comparison: 05/14/2011  Findings: There is a ET tube with tip above the carina.  There is a right IJ catheter with tip in the cavoatrial junction.  The nasogastric tube tip is in the stomach.  Heart size is normal. Since the earlier exam there has been increase in interstitial markings suggesting pulmonary edema.  Atelectasis is noted within both lung bases.  IMPRESSION:  1.  The ET tube and the right IJ catheter are in satisfactory position.  No pneumothorax identified. 2.  Suspect increase in pulmonary edema with bibasilar atelectasis.  Original Report Authenticated By: Rosealee Albee, M.D.   Dg Chest Port 1 View  05/14/2011  *RADIOLOGY REPORT*  Clinical Data: Shortness of breath  PORTABLE CHEST - 1 VIEW  Comparison: 02/23/2011  Findings: Heart size upper normal limits to mildly enlarged.  No focal consolidation.  No pleural effusion or pneumothorax.  No acute osseous abnormality.  IMPRESSION: Heart size upper normal limits to mildly enlarged without focal consolidation.  Original Report Authenticated By: Waneta Martins, M.D.   Dg Chest Port 1v Same Day  05/15/2011  *RADIOLOGY REPORT*  Clinical Data: Follow-up bowel surgery.  Endotracheal tube removed.  PORTABLE CHEST - 1 VIEW SAME DAY  Comparison: Portable film earlier in the day  Findings: Endotracheal tube has been removed.  Mild atelectasis left base is stable.  Nasogastric tube good position.  Central venous catheter unchanged SVC.  Mild vascular congestion.  No pneumothorax.  IMPRESSION: Satisfactory appearance following endotracheal tube removal.  Mild left basilar atelectasis unchanged.  Original Report Authenticated By: Elsie Stain, M.D.       Brief H and P: For complete  details please refer to admission H and P, but in brief  The  patient is a 61 year old white female with past medical history significant recent discharge from hospital -4 days ago from the surgery service are following hospitalization for intervral perforation or anastomotic leak and she had him an exploratory laparotomy with a ileostomy done per [place] [facility], so history of hypothyroidism, hypertension depression and diverticulitis who presents to [facility] ED 05/27/2012 with complaints of worsening shortness of breath and left sided pleuritic pain.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 She has an extensive history of abdominal surgeries -as listed had a total colectomy with a J-pouch 03/01/2011, and subsequently arm ileostomy closure/take down was done a 05/02/2011 and then on 1216 she had the exploratory laparotomy with small bowel resection and end ileostomy on 12/16 per Dr Daphine Deutscher.  She states that she was awakened by a sharp pain in her shoulder and back this a.m and she was unable to breath. She states that initially the pain was mostly with deep inspiration but subsequently just got more short of breath and so they came to the ED. She denies cough, fevers, also denies any URI symptoms. She was seen in the ED and a chest x-ray was done which showed by basilar airspace disease worse on the left are early infection not excluded also question eft pleural effusion. CT angiogram of the chest was done because of size suspicion for PE in and it showed slight atelectasis at the bases left greater than right with a tiny left effusion - no pulmonary emboli. She was noted to have a leukocytosis of 16 and she started on empiric antibiotics for hospital-acquired pneumonia and is admitted for further evaluation and management. Surgery was consulted per the ED given that she was just recently discharged from their service and they requested admission to the hospitalist service        Hospital Course:  No resolved problems to display.  Active Hospital Problems  Diagnoses Date Noted   . Hospital-acquired pneumonia 05/28/2011   . 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 DEPRESSION 02/23/2010   . SINUSITIS, ACUTE 04/12/2009   . HYPOTHYROIDISM 10/06/2008     Resolved Hospital Problems  Diagnoses Date Noted Date Resolved    Active Problems:  Active Problems:  Hospital-acquired pneumonia : Improved. Afebrile. WC WNL. Pt initally treated with Vanc and Zosyn. Clinical improvement. Vanc dc'd 05/30/2011. Zosyn  change to Levaquin 05/31/2011. Will have 10 more days of levaquin at discharge.  Sats range 94-97% room air.  -still with pleuritic pain usually limited to first thing in am after sleeping. Managed will with po pain meds. 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 INR therapeutic at discharge. Pt. Will have 2mg  coumadin for 3 days and INR checked on 4th day and called to PCP S/p ilestomy/ abdominal surgeries- per [facility] . Seen by surgery during this hospitalization. No issues. Continue with daily dressing changes. Will have home health to do dressing changes and draw INR   Time spent on Discharge: 45 minutes  Signed: Gwenyth Bender 06/01/2011, 1:57 PM

## 2011-06-01 NOTE — Progress Notes (Signed)
SPOKE TO PATIENT ABOUT D/C PLANS. SHE WAS ALREADY ACTIVE W/[facility] W/HHRN-DSG CHANGES/WOUND CARE,ADDED IS PT/INR.PCP-DR. BURCHETTE.CONTACTED [facility](DIANA)TEL#[phone],FAX#[phone].WILL FAX H&P,OP NOTE,FACE SHEET.ONCE D/C SUMMARY DONE,& ORDERS FOR HH WILL FAX ALSO.

## 2011-06-01 NOTE — Progress Notes (Addendum)
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 Modafinil Rash    REACTION: rash    Patient Measurements: Height: 5\' 9"  (175.3 cm) Weight: 174 lb 2.6 oz (79 kg) IBW/kg (Calculated) : 66.2   Vital Signs: Temp: 98.8 F (37.1 C) (01/03 0509) Temp src: Oral (01/03 0509) BP: 137/89 mmHg (01/03 0509) Pulse Rate: 81  (01/03 0509)  Labs:  Basename 06/01/11 0530 05/31/11 0451 05/30/11 0500  HGB 9.0* -- 9.1*  HCT 27.7* -- 28.3*  PLT 459* -- 454*  APTT -- -- --  LABPROT 27.8* 26.1* 22.6*  INR 2.54* 2.35* 1.95*  HEPARINUNFRC -- -- --  CREATININE 0.61 0.71 --  CKTOTAL -- -- --  CKMB -- -- --  TROPONINI -- -- --   Estimated Creatinine Clearance: 80.1 ml/min (by C-G formula based on Cr of 0.61).   Medications:  Scheduled:    . levofloxacin  750 mg Oral Daily  . levothyroxine  137 mcg Oral Daily  . losartan  50 mg Oral Daily  . meloxicam  15 mg Oral QHS  . montelukast  10 mg Oral QHS  . potassium chloride  40 mEq Oral Once  . sertraline  100 mg Oral Daily  . warfarin  3 mg Oral ONCE-1800  . DISCONTD: piperacillin-tazobactam (ZOSYN)  IV  3.375 g Intravenous Q8H   Infusions:    . 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 Pt has not yet established a stable dosing regimen for warfarin as outpt (using 1mg  tabs at home). INR today remains therapeutic. No bleeding events reported in chart notes. So far (in hospital) has had: warfarin 2mg , 2mg , 3mg , 3mg .  Drug Interaction: Levaquin (may increase INR)   Goal of Therapy:  INR 2-3   Plan:  1)  Warfarin 2mg  PO x1 2) F/U daily INR trend  Annia Belt 06/01/2011,12:42 PM

## 2011-06-01 NOTE — Discharge Summary (Signed)
Patient was seen and examined by self. Agree with Ms. Maia Plan note as above. Patient is stable for discharge.

## 2011-06-02 ENCOUNTER — Ambulatory Visit: Payer: Managed Care, Other (non HMO) | Admitting: Family Medicine

## 2011-06-04 ENCOUNTER — Emergency Department (HOSPITAL_COMMUNITY)
Admission: EM | Admit: 2011-06-04 | Discharge: 2011-06-04 | Disposition: A | Discharge status: Home / Self Care | Payer: Managed Care, Other (non HMO) | Attending: Emergency Medicine | Admitting: Emergency Medicine

## 2011-06-04 ENCOUNTER — Encounter (HOSPITAL_COMMUNITY): Payer: Self-pay | Admitting: *Deleted

## 2011-06-04 DIAGNOSIS — K9413 Enterostomy malfunction: Secondary | ICD-10-CM | POA: Insufficient documentation

## 2011-06-04 DIAGNOSIS — Z7901 Long term (current) use of anticoagulants: Secondary | ICD-10-CM | POA: Insufficient documentation

## 2011-06-04 DIAGNOSIS — Z79899 Other long term (current) drug therapy: Secondary | ICD-10-CM | POA: Insufficient documentation

## 2011-06-04 DIAGNOSIS — Z433 Encounter for attention to colostomy: Secondary | ICD-10-CM

## 2011-06-04 DIAGNOSIS — E039 Hypothyroidism, unspecified: Secondary | ICD-10-CM | POA: Insufficient documentation

## 2011-06-04 DIAGNOSIS — I10 Essential (primary) hypertension: Secondary | ICD-10-CM | POA: Insufficient documentation

## 2011-06-04 DIAGNOSIS — E785 Hyperlipidemia, unspecified: Secondary | ICD-10-CM | POA: Insufficient documentation

## 2011-06-04 DIAGNOSIS — F329 Major depressive disorder, single episode, unspecified: Secondary | ICD-10-CM | POA: Insufficient documentation

## 2011-06-04 DIAGNOSIS — K9403 Colostomy malfunction: Secondary | ICD-10-CM | POA: Insufficient documentation

## 2011-06-04 DIAGNOSIS — F3289 Other specified depressive episodes: Secondary | ICD-10-CM | POA: Insufficient documentation

## 2011-06-04 NOTE — ED Provider Notes (Signed)
History     CSN: [id number]  Arrival date & time 06/04/11  0404   First MD Initiated Contact with Patient 06/04/11 [phone]      Chief Complaint  Patient presents with  . Abdominal Pain    Leaking ostomy bag, needs to be applied.    (Consider location/radiation/quality/duration/timing/severity/associated sxs/prior treatment) HPI  Past Medical History  Diagnosis Date  . HYPOTHYROIDISM 10/06/2008  . HYPERLIPIDEMIA 10/06/2008  . DEPRESSION 02/23/2010  . HYPERTENSION 10/06/2008  . DIVERTICULITIS, ACUTE 11/10/2008  . ALLERGIC RHINITIS 07/23/2009  . Diverticulosis of colon 11/10/2008    Past Surgical History  Procedure Date  . Abdominal hysterectomy 1986  . Thyroid surgery 1982    tumor removed  . Cesarean section 1983  . Rotator cuff repair 2005  . Kidney stone surgery 2000, 2009, 2010  . Laparoscopic assissted total colectomy w/ j-pouch 03/01/11  . Ileostomy closure 05/02/2011    Procedure: ILEOSTOMY TAKEDOWN;  Surgeon: Mariella Saa, MD;  Location: [facility];  Service: General;  Laterality: N/A;  . Laparotomy 05/14/2011    Procedure: EXPLORATORY LAPAROTOMY;  Surgeon: Valarie Merino, MD;  Location: [facility];  Service: General;  Laterality: N/A;  small bowel resection end ileostomy    Family History  Problem Relation Age of Onset  . Arthritis Other   . Mental illness Other   . COPD Mother   . Deep vein thrombosis Father     History  Substance Use Topics  . Smoking status: Former Smoker -- 1.0 packs/day for 10 years    Types: Cigarettes    Quit date: 08/02/1980  . Smokeless tobacco: Never Used  . Alcohol Use: No    OB History    Grav Para Term Preterm Abortions TAB SAB Ect Mult Living                  Review of Systems  Allergies  Modafinil  Home Medications   Current Outpatient Rx  Name Route Sig Dispense Refill  . VITAMIN C PO CHEW Oral Chew 1 tablet by mouth daily.      Marland Kitchen LEVOFLOXACIN 750 MG PO TABS Oral Take 1 tablet (750 mg total) by mouth daily. 10  tablet 0  . LOSARTAN POTASSIUM 50 MG PO TABS  TAKE 1 TABLET BY MOUTH ONCE DAILY 30 tablet 11  . MELOXICAM 15 MG PO TABS Oral Take 15 mg by mouth at bedtime.     Marland Kitchen MONTELUKAST SODIUM 10 MG PO TABS Oral Take 10 mg by mouth at bedtime.     . OXYCODONE-ACETAMINOPHEN 5-325 MG PO TABS Oral Take 1-2 tablets by mouth every 4 (four) hours as needed. 24 tablet 0  . SERTRALINE HCL 100 MG PO TABS Oral Take 100 mg by mouth daily.     Marland Kitchen SYNTHROID 137 MCG PO TABS Oral Take 137 mcg by mouth daily. 30 tablet 11    Dispense as written.  . WARFARIN SODIUM 1 MG PO TABS Oral Take 2 tablets (2 mg total) by mouth daily. 4 tablet 0    INR to be drawn 06/05/2011. PCP to evaluate and dete ...  . ZOLPIDEM TARTRATE 10 MG PO TABS  take 1 tablet by mouth at bedtime if needed 30 tablet 5  . ACETAMINOPHEN 500 MG PO TABS Oral Take 1,000 mg by mouth every 6 (six) hours as needed. FOR PAIN     . ALBUTEROL SULFATE HFA 108 (90 BASE) MCG/ACT IN AERS Inhalation Inhale 2 puffs into the lungs every 6 (six) hours as  needed. 1 Inhaler 5    BP 125/76  Pulse 91  Temp(Src) 99.1 F (37.3 C) (Oral)  Resp 16  SpO2 95%  Physical Exam  ED Course  Procedures (including critical care time)  Labs Reviewed - No data to display No results found.   No diagnosis found.    MDM   Ostomy nurse came by prior to 7 AM and put on a new bag. Since placement of the new ostomy care. And no leaks. Attempted to speak with Dr. Gerrit Friends and he was no longer available but his colleague stated if there is no leaks then she can go home. On reevaluation of the patient and her ostomy is not leaking and will discharge home to followup        Gwyneth Sprout, MD 06/04/11 [phone]

## 2011-06-04 NOTE — ED Notes (Signed)
Ostomy care provided by ostomy nurse, no acute distress

## 2011-06-04 NOTE — ED Notes (Signed)
Pt has an illeostomy that necessitates a bag.  Pt nor her husband can make the bag stick to the pt's skin.  Pt presents tonight as instructed to do so by her GI doctor in reference to her bag not sticking.

## 2011-06-04 NOTE — ED Notes (Signed)
Pt c/o leaking colostomy started 9 pm last night, denies pain, denies nausea, denies vomiting, abdomen is soft (-) tenderness (-) distention

## 2011-06-04 NOTE — ED Notes (Signed)
Pt alert and oriented x4. Respirations even and unlabored, bilateral symmetrical rise and fall of chest. Skin warm and dry. In no acute distress. Denies needs. Pt reported ound care nurse was in pt room prior to shift change. Left sided colostomy bag no leaking at present. Right side colostomy covered with bandage.

## 2011-06-04 NOTE — ED Provider Notes (Addendum)
History     CSN: [id number]  Arrival date & time 06/04/11  0404   First MD Initiated Contact with Patient 06/04/11 [phone]      Chief Complaint  Patient presents with  . Abdominal Pain    Leaking ostomy bag, needs to be applied.    (Consider location/radiation/quality/duration/timing/severity/associated sxs/prior treatment) The history is provided by the patient and the spouse.   patient here complaining of trouble having her ostomy bag adhere to her skin. History of similar symptoms multiple times which has require an ostomy nurse to fix. Noted that night she has some leaking at  A restaurant. Denies any abdominal pain, fever, vomiting. Called her surgeon and was told to come here for evaluation  Past Medical History  Diagnosis Date  . HYPOTHYROIDISM 10/06/2008  . HYPERLIPIDEMIA 10/06/2008  . DEPRESSION 02/23/2010  . HYPERTENSION 10/06/2008  . DIVERTICULITIS, ACUTE 11/10/2008  . ALLERGIC RHINITIS 07/23/2009  . Diverticulosis of colon 11/10/2008    Past Surgical History  Procedure Date  . Abdominal hysterectomy 1986  . Thyroid surgery 1982    tumor removed  . Cesarean section 1983  . Rotator cuff repair 2005  . Kidney stone surgery 2000, 2009, 2010  . Laparoscopic assissted total colectomy w/ j-pouch 03/01/11  . Ileostomy closure 05/02/2011    Procedure: ILEOSTOMY TAKEDOWN;  Surgeon: Mariella Saa, MD;  Location: [facility];  Service: General;  Laterality: N/A;  . Laparotomy 05/14/2011    Procedure: EXPLORATORY LAPAROTOMY;  Surgeon: Valarie Merino, MD;  Location: [facility];  Service: General;  Laterality: N/A;  small bowel resection end ileostomy    Family History  Problem Relation Age of Onset  . Arthritis Other   . Mental illness Other   . COPD Mother   . Deep vein thrombosis Father     History  Substance Use Topics  . Smoking status: Former Smoker -- 1.0 packs/day for 10 years    Types: Cigarettes    Quit date: 08/02/1980  . Smokeless tobacco: Never Used  . Alcohol  Use: No    OB History    Grav Para Term Preterm Abortions TAB SAB Ect Mult Living                  Review of Systems  All other systems reviewed and are negative.    Allergies  Modafinil  Home Medications   Current Outpatient Rx  Name Route Sig Dispense Refill  . ACETAMINOPHEN 500 MG PO TABS Oral Take 1,000 mg by mouth every 6 (six) hours as needed. FOR PAIN     . ALBUTEROL SULFATE HFA 108 (90 BASE) MCG/ACT IN AERS Inhalation Inhale 2 puffs into the lungs every 6 (six) hours as needed. 1 Inhaler 5  . VITAMIN C PO CHEW Oral Chew 1 tablet by mouth daily.      . GUAIFENESIN-DM 100-10 MG/5ML PO SYRP Oral Take 5 mLs by mouth every 4 (four) hours as needed for cough. 118 mL 0  . LEVOFLOXACIN 750 MG PO TABS Oral Take 1 tablet (750 mg total) by mouth daily. 10 tablet 0  . LOSARTAN POTASSIUM 50 MG PO TABS  TAKE 1 TABLET BY MOUTH ONCE DAILY 30 tablet 11  . MELOXICAM 15 MG PO TABS Oral Take 15 mg by mouth at bedtime.     Marland Kitchen MONTELUKAST SODIUM 10 MG PO TABS Oral Take 10 mg by mouth at bedtime.     . OXYCODONE-ACETAMINOPHEN 5-325 MG PO TABS Oral Take 1-2 tablets by mouth every 4 (  four) hours as needed. 24 tablet 0  . SERTRALINE HCL 100 MG PO TABS Oral Take 100 mg by mouth daily.     Marland Kitchen SYNTHROID 137 MCG PO TABS Oral Take 137 mcg by mouth daily. 30 tablet 11    Dispense as written.  . WARFARIN SODIUM 1 MG PO TABS Oral Take 2 tablets (2 mg total) by mouth daily. 4 tablet 0    INR to be drawn 06/05/2011. PCP to evaluate and dete ...  . ZOLPIDEM TARTRATE 10 MG PO TABS  take 1 tablet by mouth at bedtime if needed 30 tablet 5    BP 131/86  Pulse 110  Temp(Src) 99.2 F (37.3 C) (Oral)  Resp 20  SpO2 97%  Physical Exam  Nursing note and vitals reviewed. Constitutional: She is oriented to person, place, and time. She appears well-developed and well-nourished.  Non-toxic appearance. No distress.  HENT:  Head: Normocephalic and atraumatic.  Eyes: Conjunctivae, EOM and lids are normal. Pupils  are equal, round, and reactive to light.  Neck: Normal range of motion. Neck supple. No tracheal deviation present. No mass present.  Cardiovascular: Normal rate, regular rhythm and normal heart sounds.  Exam reveals no gallop.   No murmur heard. Pulmonary/Chest: Effort normal and breath sounds normal. No stridor. No respiratory distress. She has no decreased breath sounds. She has no wheezes. She has no rhonchi. She has no rales.  Abdominal: Soft. Normal appearance and bowel sounds are normal. She exhibits no distension. There is no tenderness. There is no rebound and no CVA tenderness.    Musculoskeletal: Normal range of motion. She exhibits no edema and no tenderness.  Neurological: She is alert and oriented to person, place, and time. She has normal strength. No cranial nerve deficit or sensory deficit. GCS eye subscore is 4. GCS verbal subscore is 5. GCS motor subscore is 6.  Skin: Skin is warm and dry. No abrasion and no rash noted.  Psychiatric: She has a normal mood and affect. Her speech is normal and behavior is normal.    ED Course  Procedures (including critical care time)  Labs Reviewed - No data to display No results found.   No diagnosis found.    MDM  Spoke with Dr. Gerrit Friends, will see in ed        Toy Baker, MD 06/04/11 1610  Toy Baker, MD 06/04/11 [phone]

## 2011-06-04 NOTE — ED Notes (Signed)
Dr Ezzard Standing called and reported pt can be sent home.

## 2011-06-04 NOTE — ED Notes (Signed)
[facility] rn called and requested RN documted a eakin pouch was applied to left colostomy by epperson.

## 2011-06-05 ENCOUNTER — Telehealth: Payer: Self-pay | Admitting: Family Medicine

## 2011-06-05 NOTE — Telephone Encounter (Signed)
Need more details regarding Coumadin. When was this initiated?  What was her previous INR?

## 2011-06-05 NOTE — Telephone Encounter (Signed)
Continue same dose and recheck INR in 2 weeks.

## 2011-06-05 NOTE — Telephone Encounter (Signed)
Pt informed, Cicero Duck with Home health informed

## 2011-06-05 NOTE — Telephone Encounter (Signed)
[facility] called and said that pts PT/INR reading was INR 2.2 and PT is 21.6 pt is taking currently 2 mg of coumadin daily. Need call back to get dosage instructions.

## 2011-06-05 NOTE — Telephone Encounter (Signed)
Pt was released from hosp on 12/27, and here at [facility] Friday, 12/28 INR was 2.3.  Per Dr Lovell Sheehan, no coumadin that day and begin with 1 mg daily, recheck on Monday.  Pt went back into the hospital 12-30 with pneumonia and reports taking 2 mg daily.  INR was never checked at the hospital per pt.  Pt returned home Friday, 1/4.  Home Health checked INR today and it was 2.2.  Pt takes her coumadin at 6pm every day.  Home health Alcario Drought has arranged to visit pt M-W-F.

## 2011-06-05 NOTE — Telephone Encounter (Signed)
Please advise 

## 2011-06-08 ENCOUNTER — Ambulatory Visit (INDEPENDENT_AMBULATORY_CARE_PROVIDER_SITE_OTHER): Payer: Managed Care, Other (non HMO) | Admitting: General Surgery

## 2011-06-08 ENCOUNTER — Encounter (INDEPENDENT_AMBULATORY_CARE_PROVIDER_SITE_OTHER): Payer: Self-pay | Admitting: General Surgery

## 2011-06-08 VITALS — Ht 69.0 in | Wt 162.4 lb

## 2011-06-08 DIAGNOSIS — Z932 Ileostomy status: Secondary | ICD-10-CM

## 2011-06-08 NOTE — Progress Notes (Signed)
Patient returns to the office following laparotomy and ileostomy for a small bowel leak after ileostomy takedown. She is now just over 3 weeks postop. She had to be rehospitalized briefly for pneumonia. Difficulty since discharge is again leaking at her ileostomy bag which required a trip to the emergency room. She now however is seeing [employer] and has had a bag on that has been functional for several days. She is not have any abdominal pain. Appetite is fair. No fever. She is completing her oral antibiotics for pneumonia.  On exam she is not appear ill. Abdomen is generally soft and nontender. Her midline wound is almost completely healed with just a thin strip of clean granulation tissue. She has an ostomy device that is in place and intact.  Assessment and plan: Generally doing pretty well at this point following repeat laparotomy and ileostomy and postoperative pneumonia. They currently have an ostomy device that is working. No change in current treatment plan. Turned in 3 weeks

## 2011-06-09 ENCOUNTER — Ambulatory Visit (INDEPENDENT_AMBULATORY_CARE_PROVIDER_SITE_OTHER): Payer: Managed Care, Other (non HMO) | Admitting: Family Medicine

## 2011-06-09 ENCOUNTER — Encounter: Payer: Self-pay | Admitting: Family Medicine

## 2011-06-09 VITALS — BP 102/76 | Temp 98.8°F | Wt 163.0 lb

## 2011-06-09 DIAGNOSIS — I82409 Acute embolism and thrombosis of unspecified deep veins of unspecified lower extremity: Secondary | ICD-10-CM

## 2011-06-09 DIAGNOSIS — J189 Pneumonia, unspecified organism: Secondary | ICD-10-CM

## 2011-06-09 DIAGNOSIS — R109 Unspecified abdominal pain: Secondary | ICD-10-CM

## 2011-06-09 DIAGNOSIS — K55069 Acute infarction of intestine, part and extent unspecified: Secondary | ICD-10-CM

## 2011-06-09 DIAGNOSIS — R5381 Other malaise: Secondary | ICD-10-CM

## 2011-06-09 DIAGNOSIS — K55059 Acute (reversible) ischemia of intestine, part and extent unspecified: Secondary | ICD-10-CM

## 2011-06-09 MED ORDER — OXYCODONE-ACETAMINOPHEN 5-325 MG PO TABS: 1.0000 | ORAL_TABLET | ORAL | Status: AC | PRN

## 2011-06-09 NOTE — Progress Notes (Signed)
  Subjective:    Patient ID: [name], female    DOB: 01-05-1951, 61 y.o.   MRN: [id number]  HPI  Hospital followup for recent dyspnea and pleuritic pain. History is that she had recurrent diverticulitis and underwent total colectomy back in October. She's had complications and multiple surgeries since then. She had reported exploratory laparotomy with small bowel resection and ileostomy on 05/14/2011. Recently admitted 12 /30/2012. She awoke with shortness of breath and sharp intense pain which was left-sided pleuritic with some radiation left shoulder all the way down the left flank. Chest x-ray showed by basilar airspace disease worse left than right with infection not excluded. She had a CT angiogram for suspicion of pulmonary embolus but this showed atelectasis but no pulmonary emboli. Leukocytosis with white blood cell 16,000.  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 Treated for hospital-acquired pneumonia initially with vancomycin and Zosyn. Eventually transitioned to Levaquin and she has 2 more days of this antibiotic. At this point she has no cough. Still has some fatigue and malaise. No fever. No cough.  Still has some left flank pain. She has history of kidney stones. Recent CT scan revealed no evidence for hydronephrosis. She has left and right-sided stones but these were not felt to be acute problem. Patient discharged on oxycodone. She is trying to take this sparingly. Still has fairly intense pain at times. Also discharged on Coumadin with recent INR 2.2. This is followed by home health.  She and her husband are quite frustrated with her multiple recent complications and hospitalizations. She takes antidepressant medication.  Discussed possible counseling for recent stress issues and she is not interested at this time.   Review of Systems  Constitutional: Positive for fatigue. Negative for fever and chills.    Respiratory: Negative for cough.   Cardiovascular: Negative for chest pain, palpitations and leg swelling.  Gastrointestinal: Negative for nausea, vomiting and abdominal pain.  Genitourinary: Negative for dysuria.  Musculoskeletal: Positive for back pain.       Objective:   Physical Exam  Constitutional: She appears well-developed and well-nourished.  HENT:  Mouth/Throat: Oropharynx is clear and moist.  Neck: Neck supple. No thyromegaly present.  Cardiovascular: Normal rate and regular rhythm.   Pulmonary/Chest: Effort normal and breath sounds normal. No respiratory distress. She has no wheezes. She has no rales.  Abdominal: Soft. She exhibits no distension.       She has ileostomy pouch. Abdomen soft nontender.  Musculoskeletal: She exhibits no edema.       No reproducible left flank pain. No spinal tenderness. No skin rashes.  Lymphadenopathy:    She has no cervical adenopathy.          Assessment & Plan:  #1 question of recent hospital-acquired pneumonia. Normal lung exam at this time afebrile. Finish out Levaquin. #2 somewhat poorly localized flank and back pain. Sounds more musculoskeletal. Refill oxycodone for sparing use. #3 superior mesenteric vein thrombosis. Patient on Coumadin with scheduled INR in 2 weeks #4 history of recurrent diverticulitis with multiple abdominal surgeries and current ileostomy #5 deconditioning and generalized weakness related to multiple hospitalizations and surgeries as above.  Discussed nutritional issues. Hopefully she can regain her strength and nutritional status to have takedown of ileostomy in a few months

## 2011-06-12 ENCOUNTER — Telehealth: Payer: Self-pay | Admitting: *Deleted

## 2011-06-12 NOTE — Telephone Encounter (Signed)
[facility] is calling to let Dr Caryl Never know her BP is 90/60 at the visit today?  Any changes?

## 2011-06-12 NOTE — Telephone Encounter (Signed)
Not taking any BP meds so focus on increased hydration.

## 2011-06-12 NOTE — Telephone Encounter (Signed)
Notified Home Health

## 2011-06-13 ENCOUNTER — Other Ambulatory Visit: Payer: Self-pay | Admitting: Family Medicine

## 2011-06-19 ENCOUNTER — Telehealth: Payer: Self-pay | Admitting: *Deleted

## 2011-06-19 NOTE — Telephone Encounter (Signed)
PT: 10.0 INR:  1.0  Pt is taking Coumadin 2 mg q day.

## 2011-06-20 MED ORDER — WARFARIN SODIUM 1 MG PO TABS: 1.0000 mg | ORAL_TABLET | Freq: Every day | ORAL | Status: DC

## 2011-06-20 NOTE — Telephone Encounter (Signed)
Pt is  Taking the Coumadin regularly, and will increase to 3 mg. Daily and repeat labs in one week.

## 2011-06-20 NOTE — Telephone Encounter (Signed)
Her protimes had been therapeutic. Very unusual for her INR to go to 1.0 unless she is not taking medication regularly. Confirm that she is taking Coumadin regularly. If she has not missed any doses then increased to 3 mg daily and reassess one week

## 2011-06-21 ENCOUNTER — Other Ambulatory Visit: Payer: Self-pay | Admitting: *Deleted

## 2011-06-21 MED ORDER — WARFARIN SODIUM 1 MG PO TABS: ORAL_TABLET | ORAL | Status: DC

## 2011-06-21 NOTE — Telephone Encounter (Signed)
Per phone note from 1/21, will correct sig to 3 tabs (3 mg) daily, repeat INR in 1 week

## 2011-06-26 ENCOUNTER — Other Ambulatory Visit: Payer: Self-pay | Admitting: Family Medicine

## 2011-06-26 ENCOUNTER — Telehealth: Payer: Self-pay | Admitting: *Deleted

## 2011-06-26 NOTE — Telephone Encounter (Signed)
Notified Home Health.  [name])

## 2011-06-26 NOTE — Telephone Encounter (Signed)
PT:  19.8  INR:  2.0  Taking 3 mg Coumadin daily.

## 2011-06-26 NOTE — Telephone Encounter (Signed)
At goal  Continue with same dose and repeat in 2 weeks and if normal then will go to every 4 weeks.

## 2011-06-27 ENCOUNTER — Telehealth (INDEPENDENT_AMBULATORY_CARE_PROVIDER_SITE_OTHER): Payer: Self-pay

## 2011-06-27 NOTE — Telephone Encounter (Signed)
Appointment reminder call for 06/29/11 w/Dr. Johna Sheriff @ 9:15

## 2011-06-29 ENCOUNTER — Encounter (INDEPENDENT_AMBULATORY_CARE_PROVIDER_SITE_OTHER): Payer: Self-pay | Admitting: General Surgery

## 2011-06-29 ENCOUNTER — Ambulatory Visit (INDEPENDENT_AMBULATORY_CARE_PROVIDER_SITE_OTHER): Payer: Managed Care, Other (non HMO) | Admitting: General Surgery

## 2011-06-29 VITALS — BP 106/72 | HR 100 | Temp 96.9°F | Resp 18 | Ht 69.0 in | Wt 169.6 lb

## 2011-06-29 DIAGNOSIS — Z09 Encounter for follow-up examination after completed treatment for conditions other than malignant neoplasm: Secondary | ICD-10-CM

## 2011-06-29 NOTE — Progress Notes (Signed)
History: Patient returns now 6 weeks following laparotomy for small bowel anastomotic leak with ileostomy. She generally has been getting along much better. She is gradually gaining strength and becoming more active. She has a stoma device that is working well. No shortness of breath or chest pain.  Exam: Gen.: She looks stronger and in no distress Abdomen: Her midline wound is well healed. I removed a small Prolene stitch that it popped through the skin. Ostomy looks fine. Soft and nontender.  Assessment plan: Status post laparotomy and ileostomy for anastomotic leak. 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 Postoperative pneumonia now resolved. She is making good progress. She's not improved back to work. We're anticipating takedown of her ileostomy no earlier than mid June. I will see her back in 2 months.

## 2011-07-10 ENCOUNTER — Ambulatory Visit (INDEPENDENT_AMBULATORY_CARE_PROVIDER_SITE_OTHER): Payer: Managed Care, Other (non HMO) | Admitting: Family Medicine

## 2011-07-10 DIAGNOSIS — I82409 Acute embolism and thrombosis of unspecified deep veins of unspecified lower extremity: Secondary | ICD-10-CM

## 2011-07-10 NOTE — Patient Instructions (Signed)
  Latest dosing instructions   Total Sun Mon Tue Wed Thu Fri Sat   21 3 mg 3 mg 3 mg 3 mg 3 mg 3 mg 3 mg    (1 mg3) (1 mg3) (1 mg3) (1 mg3) (1 mg3) (1 mg3) (1 mg3)        

## 2011-07-20 ENCOUNTER — Other Ambulatory Visit: Payer: Self-pay | Admitting: Family Medicine

## 2011-07-20 DIAGNOSIS — N63 Unspecified lump in unspecified breast: Secondary | ICD-10-CM

## 2011-08-02 ENCOUNTER — Other Ambulatory Visit (INDEPENDENT_AMBULATORY_CARE_PROVIDER_SITE_OTHER): Payer: Managed Care, Other (non HMO)

## 2011-08-02 DIAGNOSIS — Z0000 Encounter for general adult medical examination without abnormal findings: Secondary | ICD-10-CM

## 2011-08-02 DIAGNOSIS — I82409 Acute embolism and thrombosis of unspecified deep veins of unspecified lower extremity: Secondary | ICD-10-CM

## 2011-08-02 LAB — HEPATIC FUNCTION PANEL
ALT: 27 U/L (ref 0–35)
AST: 24 U/L (ref 0–37)
Bilirubin, Direct: 0 mg/dL (ref 0.0–0.3)
Total Bilirubin: 0.3 mg/dL (ref 0.3–1.2)
Total Protein: 7.5 g/dL (ref 6.0–8.3)

## 2011-08-02 LAB — BASIC METABOLIC PANEL
BUN: 27 mg/dL — ABNORMAL HIGH (ref 6–23)
CO2: 19 mEq/L (ref 19–32)
Calcium: 10.8 mg/dL — ABNORMAL HIGH (ref 8.4–10.5)
Creatinine, Ser: 1.3 mg/dL — ABNORMAL HIGH (ref 0.4–1.2)
Glucose, Bld: 89 mg/dL (ref 70–99)

## 2011-08-02 LAB — POCT URINALYSIS DIPSTICK
Bilirubin, UA: NEGATIVE
Ketones, UA: NEGATIVE
Spec Grav, UA: >=1.03
pH, UA: 5.5

## 2011-08-02 LAB — CBC WITH DIFFERENTIAL/PLATELET
Basophils Absolute: 0 10*3/uL (ref 0.0–0.1)
Eosinophils Absolute: 0.2 10*3/uL (ref 0.0–0.7)
Lymphocytes Relative: 34.7 % (ref 12.0–46.0)
MCHC: 33.9 g/dL (ref 30.0–36.0)
Monocytes Absolute: 0.5 10*3/uL (ref 0.1–1.0)
Neutrophils Relative %: 55.9 % (ref 43.0–77.0)
RDW: 17.9 % — ABNORMAL HIGH (ref 11.5–14.6)

## 2011-08-02 LAB — LIPID PANEL
HDL: 62.6 mg/dL (ref >39.00)
Total CHOL/HDL Ratio: 5
VLDL: 91.4 mg/dL — ABNORMAL HIGH (ref 0.0–40.0)

## 2011-08-02 LAB — POCT INR: INR: 3.1

## 2011-08-02 LAB — LDL CHOLESTEROL, DIRECT: Direct LDL: 146.6 mg/dL

## 2011-08-02 NOTE — Patient Instructions (Signed)
  Latest dosing instructions   Total Sun Mon Tue Wed Thu Fri Sat   21 3 mg 3 mg 3 mg 3 mg 3 mg 3 mg 3 mg    (1 mg3) (1 mg3) (1 mg3) (1 mg3) (1 mg3) (1 mg3) (1 mg3)

## 2011-08-03 NOTE — Progress Notes (Signed)
Quick Note:  Will review with pt in 5 days ______

## 2011-08-04 ENCOUNTER — Encounter (HOSPITAL_COMMUNITY): Payer: Self-pay

## 2011-08-07 ENCOUNTER — Ambulatory Visit (INDEPENDENT_AMBULATORY_CARE_PROVIDER_SITE_OTHER): Payer: Managed Care, Other (non HMO) | Admitting: Family Medicine

## 2011-08-07 ENCOUNTER — Encounter: Payer: Self-pay | Admitting: Family Medicine

## 2011-08-07 DIAGNOSIS — F329 Major depressive disorder, single episode, unspecified: Secondary | ICD-10-CM

## 2011-08-07 DIAGNOSIS — Z87442 Personal history of urinary calculi: Secondary | ICD-10-CM

## 2011-08-07 DIAGNOSIS — Z0000 Encounter for general adult medical examination without abnormal findings: Secondary | ICD-10-CM

## 2011-08-07 DIAGNOSIS — E039 Hypothyroidism, unspecified: Secondary | ICD-10-CM

## 2011-08-07 DIAGNOSIS — F3289 Other specified depressive episodes: Secondary | ICD-10-CM

## 2011-08-07 MED ORDER — LEVOTHYROXINE SODIUM 150 MCG PO TABS: 150.0000 ug | ORAL_TABLET | Freq: Every day | ORAL | Status: DC

## 2011-08-07 MED ORDER — SERTRALINE HCL 100 MG PO TABS: ORAL_TABLET | ORAL | Status: DC

## 2011-08-07 NOTE — Progress Notes (Signed)
Subjective:    Patient ID: [name], female    DOB: 1951/04/26, 61 y.o.   MRN: [id number]  HPI  Patient seen for complete physical. She's had multiple surgeries this past year related to extensive diverticulitis with complications regarding surgery. She's doing very well his time. She goes twice weekly to have ostomy bag checked.  Her appetite is improved.  She is eating well. She plans to have ostomy revision in May or June  Multiple chronic medical problems reviewed. 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 No bleeding complications. She has depression treated with sertraline 100 mg daily.  increased depressive symptoms since her surgery. They're questioning whether she can titrate dosage.  Hypothyroidism treated with Synthroid and compliant with therapy. TSH elevated by recent labs. She had increased fatigue. She realizes some of this may be deconditioning.  Mammogram last September. Recommend a 6 month followup. She's not noted masses.  Past Medical History  Diagnosis Date  . HYPOTHYROIDISM 10/06/2008  . HYPERLIPIDEMIA 10/06/2008  . DEPRESSION 02/23/2010  . HYPERTENSION 10/06/2008  . DIVERTICULITIS, ACUTE 11/10/2008  . ALLERGIC RHINITIS 07/23/2009  . Diverticulosis of colon 11/10/2008   Past Surgical History  Procedure Date  . Thyroid surgery 1982    tumor removed  . Cesarean section 1983  . Rotator cuff repair 2005  . Kidney stone surgery 2000, 2009, 2010  . Laparoscopic assissted total colectomy w/ j-pouch 03/01/11  . Ileostomy closure 05/02/2011    Procedure: ILEOSTOMY TAKEDOWN;  Surgeon: Mariella Saa, MD;  Location: [facility];  Service: General;  Laterality: N/A;  . Laparotomy 05/14/2011    Procedure: EXPLORATORY LAPAROTOMY;  Surgeon: Valarie Merino, MD;  Location: [facility];  Service: General;  Laterality: N/A;  small bowel resection end ileostomy  . Abdominal hysterectomy 1986    fibroids    reports that she quit smoking about 31 years ago. Her smoking use included  Cigarettes. She has a 10 pack-year smoking history. She has never used smokeless tobacco. She reports that she does not drink alcohol or use illicit drugs. family history includes Arthritis in her other; COPD in her mother; Deep vein thrombosis in her father; Depression in her father and mother; and Mental illness in her other. Allergies  Allergen Reactions  . Modafinil Rash    REACTION: rash      Review of Systems  Constitutional: Positive for fatigue. Negative for fever, activity change and appetite change.  HENT: Negative for hearing loss, ear pain, sore throat and trouble swallowing.   Eyes: Negative for visual disturbance.  Respiratory: Negative for cough and shortness of breath.   Cardiovascular: Negative for chest pain and palpitations.  Gastrointestinal: Negative for abdominal pain, diarrhea, constipation and blood in stool.  Genitourinary: Negative for dysuria and hematuria.  Musculoskeletal: Negative for myalgias, back pain and arthralgias.  Skin: Negative for rash.  Neurological: Negative for dizziness, syncope and headaches.  Hematological: Negative for adenopathy.  Psychiatric/Behavioral: Negative for confusion and dysphoric mood.       Objective:   Physical Exam  Constitutional: She is oriented to person, place, and time. She appears well-developed and well-nourished.  HENT:  Right Ear: External ear normal.  Left Ear: External ear normal.  Mouth/Throat: Oropharynx is clear and moist.  Eyes: Pupils are equal, round, and reactive to light.  Neck: Neck supple. No thyromegaly present.  Cardiovascular: Normal rate and regular rhythm.   Pulmonary/Chest: Effort normal and breath sounds normal. No respiratory distress. She has no wheezes. She has no rales.  Abdominal:  Soft. Bowel sounds are normal. She exhibits no distension. There is no tenderness. There is no rebound and no guarding.       Ostomy bag intact  Genitourinary:       Breast symmetric with no mass    Musculoskeletal: She exhibits no edema.  Lymphadenopathy:    She has no cervical adenopathy.  Neurological: She is alert and oriented to person, place, and time. No cranial nerve deficit.  Skin: No rash noted.  Psychiatric: She has a normal mood and affect. Her behavior is normal.          Assessment & Plan:  #1 health maintenance. Labs reviewed. She's had significant recovery from her surgeries back over the winter. Does have mildly elevated creatinine 1.3 which may be prerenal secondary to dehydration. Repeat basic metabolic panel in 2 weeks after hydration  #2 depression currently not remission. Titrate sertraline to 150 mg  #3 Hypothyroidism.  Under replaced.  Titrate med and repeat TSH 3 months.

## 2011-08-15 ENCOUNTER — Other Ambulatory Visit: Payer: Self-pay | Admitting: Family Medicine

## 2011-08-17 ENCOUNTER — Other Ambulatory Visit: Payer: Self-pay | Admitting: *Deleted

## 2011-08-17 MED ORDER — WARFARIN SODIUM 1 MG PO TABS: ORAL_TABLET | ORAL | Status: DC

## 2011-08-18 ENCOUNTER — Encounter (INDEPENDENT_AMBULATORY_CARE_PROVIDER_SITE_OTHER): Payer: Managed Care, Other (non HMO) | Admitting: General Surgery

## 2011-08-23 ENCOUNTER — Encounter (INDEPENDENT_AMBULATORY_CARE_PROVIDER_SITE_OTHER): Payer: Self-pay | Admitting: General Surgery

## 2011-08-23 ENCOUNTER — Ambulatory Visit (INDEPENDENT_AMBULATORY_CARE_PROVIDER_SITE_OTHER): Payer: Managed Care, Other (non HMO) | Admitting: General Surgery

## 2011-08-23 ENCOUNTER — Ambulatory Visit
Admission: RE | Admit: 2011-08-23 | Discharge: 2011-08-23 | Disposition: A | Discharge status: Home / Self Care | Payer: Managed Care, Other (non HMO) | Source: Ambulatory Visit | Attending: Family Medicine | Admitting: Family Medicine

## 2011-08-23 VITALS — BP 107/71 | HR 107 | Temp 98.4°F | Ht 67.0 in | Wt 170.2 lb

## 2011-08-23 DIAGNOSIS — Z9889 Other specified postprocedural states: Secondary | ICD-10-CM

## 2011-08-23 DIAGNOSIS — N63 Unspecified lump in unspecified breast: Secondary | ICD-10-CM

## 2011-08-23 NOTE — Patient Instructions (Signed)
Start exercise regimen as we discussed.  Take a [employer] complete vitamin daily.  Activities as tolerated.

## 2011-08-23 NOTE — Progress Notes (Signed)
Mrs. Babino is here for a postop check and to have her end ileostomy checked.  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 This was complicated by a small anastomotic leak that required exploratory laparotomy, drainage of pelvic abscess, and looped ileostomy March 18, 2011. The anastomotic leak sealed and she underwent closure of loop ileostomy May 02, 2011. This was complicated by a leak from the small bowel anastomosis and she underwent exploratory laparotomy, resection of small bowel anastomosis, an end ileostomy May 14, 2011. She end up being readmitted to the hospital with a postoperative pneumonia May 28, 2011. She is very slowly regaining her strength and endurance. 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 The length of time for her to be on Coumadin has been recommended to be 6 months. She is wondering when she may have the ileostomy closed.  PE:  General- WDWN in NAD  Abd-well-healed midline scar; left abdominal ileostomy is pink with liquid output  Assessment: 1. 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  2. 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 The initial plan is to keep her on this for 6 months and I have discussed this with one of the hemotologists.  Plan:  Start an exercise regimen to improve her strength and endurance.  Multivitamin daily.  Return visit in one month.  Do not plan on ileostomy closure before the summer.

## 2011-08-28 ENCOUNTER — Telehealth (INDEPENDENT_AMBULATORY_CARE_PROVIDER_SITE_OTHER): Payer: Self-pay

## 2011-08-28 NOTE — Telephone Encounter (Signed)
Called pt to let her know that she will be on Coumadin for 6 months per the hemotologist.  Message relayed from Dr. Abbey Chatters.

## 2011-08-29 NOTE — Telephone Encounter (Signed)
Close encounter 

## 2011-08-31 ENCOUNTER — Other Ambulatory Visit (INDEPENDENT_AMBULATORY_CARE_PROVIDER_SITE_OTHER): Payer: Managed Care, Other (non HMO)

## 2011-08-31 DIAGNOSIS — I82409 Acute embolism and thrombosis of unspecified deep veins of unspecified lower extremity: Secondary | ICD-10-CM

## 2011-08-31 DIAGNOSIS — Z87442 Personal history of urinary calculi: Secondary | ICD-10-CM

## 2011-08-31 LAB — BASIC METABOLIC PANEL
CO2: 23 mEq/L (ref 19–32)
Calcium: 10.6 mg/dL — ABNORMAL HIGH (ref 8.4–10.5)
GFR: 55.27 mL/min — ABNORMAL LOW (ref >60.00)
Sodium: 142 mEq/L (ref 135–145)

## 2011-08-31 LAB — POCT INR: INR: 2.8

## 2011-08-31 NOTE — Patient Instructions (Signed)
  Latest dosing instructions   Total Sun Mon Tue Wed Thu Fri Sat   21 3 mg 3 mg 3 mg 3 mg 3 mg 3 mg 3 mg    (1 mg3) (1 mg3) (1 mg3) (1 mg3) (1 mg3) (1 mg3) (1 mg3)

## 2011-09-01 ENCOUNTER — Other Ambulatory Visit: Payer: Self-pay | Admitting: Family Medicine

## 2011-09-01 ENCOUNTER — Encounter: Payer: Self-pay | Admitting: Family Medicine

## 2011-09-01 MED ORDER — VITAMIN D (ERGOCALCIFEROL) 1.25 MG (50000 UNIT) PO CAPS: 50000.0000 [IU] | ORAL_CAPSULE | ORAL | Status: DC

## 2011-09-01 NOTE — Progress Notes (Signed)
Quick Note:  Pt informed on cell VM, copy mailed to pt home. Will send Vit D to her pharmacy ______

## 2011-09-22 ENCOUNTER — Encounter (INDEPENDENT_AMBULATORY_CARE_PROVIDER_SITE_OTHER): Payer: Self-pay | Admitting: General Surgery

## 2011-09-22 ENCOUNTER — Ambulatory Visit (INDEPENDENT_AMBULATORY_CARE_PROVIDER_SITE_OTHER): Payer: Managed Care, Other (non HMO) | Admitting: General Surgery

## 2011-09-22 VITALS — BP 110/62 | HR 88 | Resp 18 | Ht 68.0 in | Wt 177.8 lb

## 2011-09-22 DIAGNOSIS — Z932 Ileostomy status: Secondary | ICD-10-CM

## 2011-09-22 NOTE — Progress Notes (Signed)
[name] is here for another follow up visit  In review, she underwent a subtotal colectomy with ileoproctostomy March 01, 2011. This was complicated by a small anastomotic leak that required exploratory laparotomy, drainage of pelvic abscess, and looped ileostomy March 18, 2011. The anastomotic leak sealed and she underwent closure of loop ileostomy May 02, 2011. This was complicated by a leak from the small bowel anastomosis and she underwent exploratory laparotomy, resection of small bowel anastomosis, an end ileostomy May 14, 2011. She end up being readmitted to the hospital with a postoperative pneumonia May 28, 2011.  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 The length of time for her to be on Coumadin has been recommended to be 6 months. She is exercising more and getting stronger.  She sees Page Spiro to assist her with the ileostomy care.  PE:  General- WDWN in NAD  Abd-well-healed midline scar; left abdominal ileostomy is pink with liquid output  Assessment: 1. 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  2. 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 The initial plan is to keep her on this for 6 months.  Then, I feel a repeat CT scan should be done to make sure the thrombosis has resolved.  Plan:  Continue increasing her exercise time.  Return visit in 6 weeks.

## 2011-09-22 NOTE — Patient Instructions (Signed)
Continue to slowly increase your exercise as we discussed.

## 2011-09-27 ENCOUNTER — Ambulatory Visit: Payer: Managed Care, Other (non HMO)

## 2011-09-27 DIAGNOSIS — I82409 Acute embolism and thrombosis of unspecified deep veins of unspecified lower extremity: Secondary | ICD-10-CM

## 2011-09-27 DIAGNOSIS — M25519 Pain in unspecified shoulder: Secondary | ICD-10-CM

## 2011-09-27 NOTE — Patient Instructions (Signed)
  Latest dosing instructions   Total Sun Mon Tue Wed Thu Fri Sat   18 3 mg 1.5 mg 3 mg 3 mg 3 mg 1.5 mg 3 mg    (1 mg3) (1 mg1.5) (1 mg3) (1 mg3) (1 mg3) (1 mg1.5) (1 mg3)       

## 2011-09-29 ENCOUNTER — Telehealth (INDEPENDENT_AMBULATORY_CARE_PROVIDER_SITE_OTHER): Payer: Self-pay

## 2011-09-29 NOTE — Telephone Encounter (Signed)
Pt's husband called to request something for pain for his wife.  He states the area around her ileostomy is very red and irritated, and he would like pain medication for her.  I paged Dr. Abbey Chatters who recommended paste or adhesive which they can buy at [facility].  They will contact Tristan Schroeder, RN, at [facility] for assistance.

## 2011-10-11 ENCOUNTER — Ambulatory Visit (INDEPENDENT_AMBULATORY_CARE_PROVIDER_SITE_OTHER): Payer: Managed Care, Other (non HMO) | Admitting: Family

## 2011-10-11 DIAGNOSIS — I82409 Acute embolism and thrombosis of unspecified deep veins of unspecified lower extremity: Secondary | ICD-10-CM

## 2011-10-11 NOTE — Patient Instructions (Signed)
  Latest dosing instructions   Total Sun Mon Tue Wed Thu Fri Sat   18 3 mg 1.5 mg 3 mg 3 mg 3 mg 1.5 mg 3 mg    (1 mg3) (1 mg1.5) (1 mg3) (1 mg3) (1 mg3) (1 mg1.5) (1 mg3)

## 2011-11-01 ENCOUNTER — Encounter (INDEPENDENT_AMBULATORY_CARE_PROVIDER_SITE_OTHER): Payer: Self-pay | Admitting: General Surgery

## 2011-11-01 ENCOUNTER — Ambulatory Visit (INDEPENDENT_AMBULATORY_CARE_PROVIDER_SITE_OTHER): Payer: Managed Care, Other (non HMO) | Admitting: General Surgery

## 2011-11-01 ENCOUNTER — Other Ambulatory Visit (INDEPENDENT_AMBULATORY_CARE_PROVIDER_SITE_OTHER): Payer: Self-pay | Admitting: General Surgery

## 2011-11-01 VITALS — BP 116/79 | HR 95 | Temp 98.0°F | Ht 68.0 in | Wt 175.0 lb

## 2011-11-01 DIAGNOSIS — N200 Calculus of kidney: Secondary | ICD-10-CM

## 2011-11-01 DIAGNOSIS — Z932 Ileostomy status: Secondary | ICD-10-CM

## 2011-11-01 DIAGNOSIS — I728 Aneurysm of other specified arteries: Secondary | ICD-10-CM

## 2011-11-01 NOTE — Progress Notes (Signed)
[name] is here for another follow up visit  In review, she underwent a subtotal colectomy with ileoproctostomy March 01, 2011. This was complicated by a small anastomotic leak that required exploratory laparotomy, drainage of pelvic abscess, and looped ileostomy March 18, 2011. The anastomotic leak sealed and she underwent closure of loop ileostomy May 02, 2011. This was complicated by a leak from the small bowel anastomosis and she underwent exploratory laparotomy, resection of small bowel anastomosis, an end ileostomy May 14, 2011. She end up being readmitted to the hospital with a postoperative pneumonia May 28, 2011.  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 The length of time for her to be on Coumadin has been recommended to be 6 months. She is walking up to two miles a day now.  She is having no problems with her ileostomy currently.  She is having problems with left renal stones and may need lithotripsy for these in the future.  She is seen by Dr. Annabell Howells for this.  PE:  General- WDWN in NAD  Abd-well-healed midline scar with central weakness; left abdominal ileostomy is pink with liquid output  Assessment: 1. 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  2. 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 She will have been on Coumadin for 6 months June 17.  3.  Symptomatic renal stones  Plan:  Repeat CT scan later this month.  If SMV thrombus has cleared, will stop the Coumadin.  If it has not will consult with a hematologist regarding length of therapy.  Would like to get the renal stone issue resolved prior to surgery for ileostomy closure.  RTC one month.

## 2011-11-01 NOTE — Patient Instructions (Signed)
We will let you know if you can stop the Coumadin.

## 2011-11-03 ENCOUNTER — Ambulatory Visit (INDEPENDENT_AMBULATORY_CARE_PROVIDER_SITE_OTHER): Payer: Managed Care, Other (non HMO) | Admitting: Family

## 2011-11-03 DIAGNOSIS — M25519 Pain in unspecified shoulder: Secondary | ICD-10-CM

## 2011-11-03 DIAGNOSIS — I82409 Acute embolism and thrombosis of unspecified deep veins of unspecified lower extremity: Secondary | ICD-10-CM

## 2011-11-03 LAB — POCT INR: INR: 4.1

## 2011-11-03 NOTE — Patient Instructions (Addendum)
  Latest dosing instructions   Total Sun Mon Tue Wed Thu Fri Sat   16.5 3 mg 1.5 mg 3 mg 1.5 mg 3 mg 1.5 mg 3 mg    (1 mg3) (1 mg1.5) (1 mg3) (1 mg1.5) (1 mg3) (1 mg1.5) (1 mg3)       Hold dose Friday and Saturday. Monday, Wednesday, and Friday 1.5mg . Then 3 mgs all other days.  Recheck in 1 weeks.

## 2011-11-07 ENCOUNTER — Ambulatory Visit
Admission: RE | Admit: 2011-11-07 | Discharge: 2011-11-07 | Disposition: A | Discharge status: Home / Self Care | Payer: Managed Care, Other (non HMO) | Source: Ambulatory Visit | Attending: General Surgery | Admitting: General Surgery

## 2011-11-07 DIAGNOSIS — I728 Aneurysm of other specified arteries: Secondary | ICD-10-CM

## 2011-11-07 DIAGNOSIS — N200 Calculus of kidney: Secondary | ICD-10-CM

## 2011-11-07 MED ORDER — IOHEXOL 300 MG/ML  SOLN
125.0000 mL | Freq: Once | INTRAMUSCULAR | Status: AC | PRN
  Administered 2011-11-07: 125 mL via INTRAVENOUS

## 2011-11-08 ENCOUNTER — Ambulatory Visit: Payer: Managed Care, Other (non HMO) | Admitting: Family Medicine

## 2011-11-09 ENCOUNTER — Telehealth (INDEPENDENT_AMBULATORY_CARE_PROVIDER_SITE_OTHER): Payer: Self-pay

## 2011-11-09 NOTE — Telephone Encounter (Signed)
Notified pt of CT results.  Obvious SMV thrombosis has resolved.  She may stop her warfarin per Dr. Maris Berger instructions. They did detect a large kidney stone, and the patient is scheduled to see Dr. Annabell Howells next week for evaluation and treatment.  I will fax the CT results to his office.

## 2011-11-10 ENCOUNTER — Ambulatory Visit (INDEPENDENT_AMBULATORY_CARE_PROVIDER_SITE_OTHER): Payer: Managed Care, Other (non HMO) | Admitting: Family Medicine

## 2011-11-10 ENCOUNTER — Encounter: Payer: Managed Care, Other (non HMO) | Admitting: Family

## 2011-11-10 VITALS — BP 102/60 | Temp 98.4°F | Wt 174.0 lb

## 2011-11-10 DIAGNOSIS — E039 Hypothyroidism, unspecified: Secondary | ICD-10-CM

## 2011-11-10 DIAGNOSIS — D649 Anemia, unspecified: Secondary | ICD-10-CM

## 2011-11-10 DIAGNOSIS — E785 Hyperlipidemia, unspecified: Secondary | ICD-10-CM

## 2011-11-10 LAB — LIPID PANEL
Cholesterol: 202 mg/dL — ABNORMAL HIGH (ref 0–200)
Triglycerides: 352 mg/dL — ABNORMAL HIGH (ref 0.0–149.0)

## 2011-11-10 LAB — CBC WITH DIFFERENTIAL/PLATELET
Eosinophils Relative: 4.2 % (ref 0.0–5.0)
HCT: 31.6 % — ABNORMAL LOW (ref 36.0–46.0)
Lymphs Abs: 1.8 10*3/uL (ref 0.7–4.0)
MCHC: 32.7 g/dL (ref 30.0–36.0)
MCV: 94.6 fl (ref 78.0–100.0)
Monocytes Absolute: 0.5 10*3/uL (ref 0.1–1.0)
Platelets: 205 10*3/uL (ref 150.0–400.0)
WBC: 5.2 10*3/uL (ref 4.5–10.5)

## 2011-11-10 LAB — LDL CHOLESTEROL, DIRECT: Direct LDL: 96.8 mg/dL

## 2011-11-10 LAB — TSH: TSH: 0.22 u[IU]/mL — ABNORMAL LOW (ref 0.35–5.50)

## 2011-11-10 NOTE — Progress Notes (Signed)
  Subjective:    Patient ID: [name], female    DOB: November 17, 1950, 61 y.o.   MRN: [id number]  HPI  Medical followup. Patient has history of hypothyroidism recently under replaced with TSH of 16. We increased her dosage of levothyroxine. She has not felt much different symptomatically. He has history of fairly severe hyperlipidemia with recent cholesterol 290. She is currently not taking any antilipid medications. Compliant with all regular medications. Blood pressures been very well controlled with losartan.  Patient's had multiple recent surgeries related to complications from surgery for recurrent diverticulitis. She currently has an ileostomy and plans for reversal soon. She's had good appetite and is gaining some weight steadily since her surgery several months ago. Albumin has returned to normal. Anemia was improving on last labs 3 months ago with hemoglobin 11 range. No dizziness. No chest pains. No abdominal pain.  Past Medical History  Diagnosis Date  . HYPOTHYROIDISM 10/06/2008  . HYPERLIPIDEMIA 10/06/2008  . DEPRESSION 02/23/2010  . HYPERTENSION 10/06/2008  . DIVERTICULITIS, ACUTE 11/10/2008  . ALLERGIC RHINITIS 07/23/2009  . Diverticulosis of colon 11/10/2008   Past Surgical History  Procedure Date  . Thyroid surgery 1982    tumor removed  . Cesarean section 1983  . Rotator cuff repair 2005  . Kidney stone surgery 2000, 2009, 2010  . Laparoscopic assissted total colectomy w/ j-pouch 03/01/11  . Ileostomy closure 05/02/2011    Procedure: ILEOSTOMY TAKEDOWN;  Surgeon: Mariella Saa, MD;  Location: [facility];  Service: General;  Laterality: N/A;  . Laparotomy 05/14/2011    Procedure: EXPLORATORY LAPAROTOMY;  Surgeon: Valarie Merino, MD;  Location: [facility];  Service: General;  Laterality: N/A;  small bowel resection end ileostomy  . Abdominal hysterectomy 1986    fibroids    reports that she quit smoking about 31 years ago. Her smoking use included Cigarettes. She has a 10  pack-year smoking history. She has never used smokeless tobacco. She reports that she does not drink alcohol or use illicit drugs. family history includes Arthritis in her other; COPD in her mother; Deep vein thrombosis in her father; Depression in her father and mother; and Mental illness in her other. Allergies  Allergen Reactions  . Modafinil Rash    REACTION: rash      Review of Systems  Constitutional: Negative for fever, chills, appetite change and unexpected weight change.  Respiratory: Negative for cough and shortness of breath.   Cardiovascular: Negative for chest pain, palpitations and leg swelling.  Gastrointestinal: Negative for abdominal pain.  Genitourinary: Negative for dysuria.  Neurological: Negative for dizziness, syncope and weakness.  Hematological: Negative for adenopathy.       Objective:   Physical Exam  Constitutional: She appears well-developed and well-nourished.  Neck: Neck supple. No thyromegaly present.       Scar from previous thyroid surgery  Cardiovascular: Normal rate and regular rhythm.   Pulmonary/Chest: Effort normal and breath sounds normal. No respiratory distress. She has no wheezes. She has no rales.  Musculoskeletal: She exhibits no edema.  Lymphadenopathy:    She has no cervical adenopathy.          Assessment & Plan:  #1 hypothyroidism recently under replaced. Recheck TSH #2 hyperlipidemia. Possibly exacerbated by undertreated thyroid. Repeat lipid panel. Consider statin therapy  #3 hypertension stable -at goal with current medication  #4 history of anemia related to her recent surgeries. Recheck CBC

## 2011-11-11 ENCOUNTER — Encounter: Payer: Self-pay | Admitting: Family Medicine

## 2011-11-13 ENCOUNTER — Other Ambulatory Visit: Payer: Self-pay | Admitting: *Deleted

## 2011-11-13 DIAGNOSIS — R5383 Other fatigue: Secondary | ICD-10-CM

## 2011-11-13 DIAGNOSIS — E039 Hypothyroidism, unspecified: Secondary | ICD-10-CM

## 2011-11-13 MED ORDER — SYNTHROID 137 MCG PO TABS: 137.0000 ug | ORAL_TABLET | Freq: Every day | ORAL | Status: DC

## 2011-11-13 NOTE — Progress Notes (Signed)
Quick Note:  Pt informed on cell VM labs will be mailed to er home with instructions highlighted ______

## 2011-11-16 ENCOUNTER — Encounter (HOSPITAL_COMMUNITY): Payer: Self-pay | Admitting: Pharmacy Technician

## 2011-11-16 ENCOUNTER — Other Ambulatory Visit: Payer: Self-pay | Admitting: Urology

## 2011-11-17 ENCOUNTER — Encounter (HOSPITAL_COMMUNITY): Payer: Self-pay

## 2011-11-17 ENCOUNTER — Encounter (HOSPITAL_COMMUNITY)
Admission: RE | Admit: 2011-11-17 | Discharge: 2011-11-17 | Disposition: A | Discharge status: Home / Self Care | Payer: Managed Care, Other (non HMO) | Source: Ambulatory Visit | Attending: Urology | Admitting: Urology

## 2011-11-17 DIAGNOSIS — N189 Chronic kidney disease, unspecified: Secondary | ICD-10-CM

## 2011-11-17 DIAGNOSIS — J189 Pneumonia, unspecified organism: Secondary | ICD-10-CM

## 2011-11-17 DIAGNOSIS — J45909 Unspecified asthma, uncomplicated: Secondary | ICD-10-CM

## 2011-11-17 DIAGNOSIS — K55069 Acute infarction of intestine, part and extent unspecified: Secondary | ICD-10-CM

## 2011-11-17 HISTORY — DX: Chronic kidney disease, unspecified: N18.9

## 2011-11-17 HISTORY — PX: ROTATOR CUFF REPAIR: SHX139

## 2011-11-17 HISTORY — DX: Pneumonia, unspecified organism: J18.9

## 2011-11-17 HISTORY — DX: Portal vein thrombosis: I81

## 2011-11-17 HISTORY — DX: Acute infarction of intestine, part and extent unspecified: K55.069

## 2011-11-17 HISTORY — DX: Unspecified asthma, uncomplicated: J45.909

## 2011-11-17 LAB — CBC
Hemoglobin: 10.6 g/dL — ABNORMAL LOW (ref 12.0–15.0)
MCH: 30.5 pg (ref 26.0–34.0)
MCV: 95.7 fL (ref 78.0–100.0)
Platelets: 212 10*3/uL (ref 150–400)
RBC: 3.48 MIL/uL — ABNORMAL LOW (ref 3.87–5.11)

## 2011-11-17 LAB — BASIC METABOLIC PANEL
CO2: 25 mEq/L (ref 19–32)
Calcium: 10.2 mg/dL (ref 8.4–10.5)
Creatinine, Ser: 1 mg/dL (ref 0.50–1.10)
Glucose, Bld: 96 mg/dL (ref 70–99)

## 2011-11-17 NOTE — Pre-Procedure Instructions (Signed)
[name] called and order received to d/c cipro before lithotripsy.

## 2011-11-17 NOTE — Pre-Procedure Instructions (Signed)
11-17-11 EKG/ CT Chest (05-28-11) reports with chart. W. Shronda Boeh,RN Teach back method used.

## 2011-11-17 NOTE — Patient Instructions (Addendum)
20 Nysia Dell  11/17/2011   Your procedure is scheduled on:  6-24 -2013  Report to [facility] at     0530   AM.  Call this number if you have problems the morning of surgery: [phone]   Remember:   Do not eat   food:After Midnight.    Take these medicines the morning of surgery with A SIP OF WATER: Synthroid, Claritin, Oxycodone. Bring Albuterol inhaler. Follow lithotripsy blue booklet instructions. Bring ileostomy supplies in case of need.  Do not wear jewelry, make-up or nail polish.  Do not wear lotions, powders, or perfumes. You may wear deodorant.  Do not shave 48 hours prior to surgery.(face and neck okay, no shaving of legs)  Do not bring valuables to the hospital.  Contacts, dentures or bridgework may not be worn into surgery.  Leave suitcase in the car. After surgery it may be brought to your room.  For patients admitted to the hospital, checkout time is 11:00 AM the day of discharge.   Patients discharged the day of surgery will not be allowed to drive home.  Name and phone number of your driver: spouse  Special Instructions: CHG Shower Use Special Wash: 1/2 bottle night before surgery and 1/2 bottle morning of surgery.(avoid face and genitals)   Please read over the following fact sheets that you were given: MRSA Information.Marland Kitchen

## 2011-11-20 ENCOUNTER — Ambulatory Visit (HOSPITAL_COMMUNITY): Payer: Managed Care, Other (non HMO) | Admitting: Anesthesiology

## 2011-11-20 ENCOUNTER — Encounter (HOSPITAL_COMMUNITY): Payer: Self-pay | Admitting: Anesthesiology

## 2011-11-20 ENCOUNTER — Encounter (HOSPITAL_COMMUNITY): Payer: Self-pay | Admitting: *Deleted

## 2011-11-20 ENCOUNTER — Ambulatory Visit (HOSPITAL_COMMUNITY)
Admission: RE | Admit: 2011-11-20 | Discharge: 2011-11-20 | Disposition: A | Discharge status: Home / Self Care | Payer: Managed Care, Other (non HMO) | Source: Ambulatory Visit | Attending: Urology | Admitting: Urology

## 2011-11-20 ENCOUNTER — Encounter (HOSPITAL_COMMUNITY)
Admission: RE | Disposition: A | Discharge status: Home / Self Care | Payer: Self-pay | Source: Ambulatory Visit | Attending: Urology

## 2011-11-20 ENCOUNTER — Other Ambulatory Visit: Payer: Self-pay | Admitting: Urology

## 2011-11-20 DIAGNOSIS — R3129 Other microscopic hematuria: Secondary | ICD-10-CM | POA: Insufficient documentation

## 2011-11-20 DIAGNOSIS — N200 Calculus of kidney: Secondary | ICD-10-CM | POA: Insufficient documentation

## 2011-11-20 DIAGNOSIS — J45909 Unspecified asthma, uncomplicated: Secondary | ICD-10-CM | POA: Insufficient documentation

## 2011-11-20 DIAGNOSIS — E7800 Pure hypercholesterolemia, unspecified: Secondary | ICD-10-CM | POA: Insufficient documentation

## 2011-11-20 DIAGNOSIS — N390 Urinary tract infection, site not specified: Secondary | ICD-10-CM | POA: Insufficient documentation

## 2011-11-20 DIAGNOSIS — Z79899 Other long term (current) drug therapy: Secondary | ICD-10-CM | POA: Insufficient documentation

## 2011-11-20 DIAGNOSIS — Z01812 Encounter for preprocedural laboratory examination: Secondary | ICD-10-CM | POA: Insufficient documentation

## 2011-11-20 DIAGNOSIS — E039 Hypothyroidism, unspecified: Secondary | ICD-10-CM | POA: Insufficient documentation

## 2011-11-20 DIAGNOSIS — Z85850 Personal history of malignant neoplasm of thyroid: Secondary | ICD-10-CM | POA: Insufficient documentation

## 2011-11-20 SURGERY — CYSTOSCOPY, WITH STENT INSERTION
Anesthesia: General | Laterality: Left | Wound class: Clean Contaminated

## 2011-11-20 SURGERY — LITHOTRIPSY, ESWL
Anesthesia: LOCAL | Laterality: Left

## 2011-11-20 MED ORDER — ONDANSETRON HCL 4 MG/2ML IJ SOLN: 4.0000 mg | Freq: Four times a day (QID) | INTRAMUSCULAR | Status: DC | PRN

## 2011-11-20 MED ORDER — HYDROMORPHONE HCL PF 1 MG/ML IJ SOLN: 0.2500 mg | INTRAMUSCULAR | Status: DC | PRN

## 2011-11-20 MED ORDER — SODIUM CHLORIDE 0.9 % IJ SOLN: 3.0000 mL | Freq: Two times a day (BID) | INTRAMUSCULAR | Status: DC

## 2011-11-20 MED ORDER — PROPOFOL 10 MG/ML IV BOLUS
INTRAVENOUS | Status: DC | PRN
  Administered 2011-11-20: 200 mg via INTRAVENOUS

## 2011-11-20 MED ORDER — IOHEXOL 300 MG/ML  SOLN
INTRAMUSCULAR | Status: AC
  Filled 2011-11-20: qty 1

## 2011-11-20 MED ORDER — FENTANYL CITRATE 0.05 MG/ML IJ SOLN: 25.0000 ug | INTRAMUSCULAR | Status: DC | PRN

## 2011-11-20 MED ORDER — DEXTROSE 5 % IV SOLN
1.0000 g | INTRAVENOUS | Status: DC
  Filled 2011-11-20: qty 10

## 2011-11-20 MED ORDER — LIDOCAINE HCL (CARDIAC) 20 MG/ML IV SOLN
INTRAVENOUS | Status: DC | PRN
  Administered 2011-11-20: 50 mg via INTRAVENOUS

## 2011-11-20 MED ORDER — DEXTROSE 5 % IV SOLN
1.0000 g | INTRAVENOUS | Status: DC | PRN
  Administered 2011-11-20: 1 g via INTRAVENOUS

## 2011-11-20 MED ORDER — SODIUM CHLORIDE 0.9 % IV SOLN: 250.0000 mL | INTRAVENOUS | Status: DC | PRN

## 2011-11-20 MED ORDER — CIPROFLOXACIN IN D5W 400 MG/200ML IV SOLN
INTRAVENOUS | Status: AC
  Filled 2011-11-20: qty 200

## 2011-11-20 MED ORDER — LACTATED RINGERS IV SOLN: INTRAVENOUS | Status: DC

## 2011-11-20 MED ORDER — OXYCODONE HCL 5 MG PO TABS
5.0000 mg | ORAL_TABLET | ORAL | Status: DC | PRN
  Administered 2011-11-20: 5 mg via ORAL

## 2011-11-20 MED ORDER — OXYCODONE HCL 5 MG PO TABS
ORAL_TABLET | ORAL | Status: AC
  Filled 2011-11-20: qty 1

## 2011-11-20 MED ORDER — PROMETHAZINE HCL 25 MG/ML IJ SOLN: 6.2500 mg | INTRAMUSCULAR | Status: DC | PRN

## 2011-11-20 MED ORDER — PHENAZOPYRIDINE HCL 200 MG PO TABS: 200.0000 mg | ORAL_TABLET | Freq: Three times a day (TID) | ORAL | Status: AC | PRN

## 2011-11-20 MED ORDER — MIDAZOLAM HCL 5 MG/5ML IJ SOLN
INTRAMUSCULAR | Status: DC | PRN
  Administered 2011-11-20: 2 mg via INTRAVENOUS

## 2011-11-20 MED ORDER — STERILE WATER FOR IRRIGATION IR SOLN
Status: DC | PRN
  Administered 2011-11-20: 3000 mL

## 2011-11-20 MED ORDER — ACETAMINOPHEN 650 MG RE SUPP
650.0000 mg | RECTAL | Status: DC | PRN
  Filled 2011-11-20: qty 1

## 2011-11-20 MED ORDER — SULFAMETHOXAZOLE-TRIMETHOPRIM 800-160 MG PO TABS: 1.0000 | ORAL_TABLET | Freq: Two times a day (BID) | ORAL | Status: AC

## 2011-11-20 MED ORDER — SODIUM CHLORIDE 0.9 % IJ SOLN: 3.0000 mL | INTRAMUSCULAR | Status: DC | PRN

## 2011-11-20 MED ORDER — LACTATED RINGERS IV SOLN
INTRAVENOUS | Status: DC | PRN
  Administered 2011-11-20: 07:00:00 via INTRAVENOUS

## 2011-11-20 MED ORDER — ACETAMINOPHEN 325 MG PO TABS: 650.0000 mg | ORAL_TABLET | ORAL | Status: DC | PRN

## 2011-11-20 MED ORDER — FENTANYL CITRATE 0.05 MG/ML IJ SOLN
INTRAMUSCULAR | Status: DC | PRN
  Administered 2011-11-20 (×4): 25 ug via INTRAVENOUS

## 2011-11-20 MED ORDER — CIPROFLOXACIN IN D5W 400 MG/200ML IV SOLN
400.0000 mg | INTRAVENOUS | Status: AC
  Administered 2011-11-20: 400 mg via INTRAVENOUS

## 2011-11-20 SURGICAL SUPPLY — 12 items
BAG URO CATCHER STRL LF (DRAPE) ×2 IMPLANT
CATH URET 5FR 28IN OPEN ENDED (CATHETERS) ×2 IMPLANT
CLOTH BEACON ORANGE TIMEOUT ST (SAFETY) ×2 IMPLANT
DRAPE CAMERA CLOSED 9X96 (DRAPES) ×2 IMPLANT
GLOVE SURG SS PI 8.0 STRL IVOR (GLOVE) ×2 IMPLANT
GOWN PREVENTION PLUS XLARGE (GOWN DISPOSABLE) ×2 IMPLANT
GOWN STRL REIN XL XLG (GOWN DISPOSABLE) ×2 IMPLANT
GUIDEWIRE STR DUAL SENSOR (WIRE) ×1 IMPLANT
MANIFOLD NEPTUNE II (INSTRUMENTS) ×2 IMPLANT
PACK CYSTO (CUSTOM PROCEDURE TRAY) ×2 IMPLANT
STENT CONTOUR 6FRX26X.038 (STENTS) ×1 IMPLANT
TUBING CONNECTING 10 (TUBING) ×2 IMPLANT

## 2011-11-20 NOTE — Op Note (Signed)
[name], [name]           ACCOUNT NO.:  [id number]  MEDICAL RECORD NO.:  [id number]  LOCATION:  [facility]                         FACILITY:  [facility]  PHYSICIAN:  Excell Seltzer. Annabell Howells, M.D.    DATE OF BIRTH:  1950-08-18  DATE OF PROCEDURE: DATE OF DISCHARGE:                              OPERATIVE REPORT   PROCEDURE:  Cystoscopy placement of left double-J stent.  PREOPERATIVE DIAGNOSIS:  Left renal stones, urinary tract infection.  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  SURGEON:  Excell Seltzer. Annabell Howells, M.D.  ANESTHESIA:  General.  SPECIMEN:  None.  DRAINS:  A 6-French 26-cm double-J stent.  COMPLICATIONS:  None.  INDICATIONS:  [name] is a 61 year old white female with recurrent urolithiasis who recently presented with left renal stone.  She is to undergo stenting with subsequent lithotripsy that was scheduled for today but her urine culture returned over the weekend with the positive results with resistance to Cipro and sensitivities to ceftriaxone and sulfa, so I have switched her antibiotics from Cipro to ceftriaxone this morning and will cancel the lithotripsy pending treatment of her infection.  FINDINGS OF PROCEDURES:  She was taken to the operating room, where general anesthetic was induced.  Cipro, this was switched to Rocephin and she was given 1 g.  She was placed in lithotomy position and fitted with PAS hose.  Perineum and genitalia were prepped with Betadine solution.  She was draped in usual sterile fashion.  Cystoscopy was performed using a 22-French scope and 12-degree lens.  Examination of the entire bladder wall revealed diffuse follicular cystitis, but no tumors or stones were noted.  The ureteral orifices were unremarkable.  The left ureteral orifice was cannulated with 5-French open-end catheter which was inserted to the kidney.  A sensor guidewire was then passed through the catheter to the  kidney and the catheter was removed.  There was purulent urine that effluxed alongside the wire once the open-end catheter been removed.  At this point, a 6-French 26-cm contour double-J stent was passed over the wire without difficulty to the kidney.  The wire was removed leaving good coil in the kidney and good coil in the bladder.  Purulent urine effluxed through the stent ports.  At this point, the patient's bladder was drained.  She was taken down from lithotomy position.  Her anesthetic was reversed.  She was moved to recovery in stable condition.  There were no complications.     Excell Seltzer. Annabell Howells, M.D.     JJW/MEDQ  D:  11/20/2011  T:  11/20/2011  Job:  [id number]

## 2011-11-20 NOTE — Discharge Instructions (Signed)
Ureteral Stent  A ureteral stent is a soft plastic tube with multiple holes. The stent is inserted into a ureter to help drain urine from the kidney into the bladder. The tube has a coil on each end to keep it from falling out. One end stays in the kidney. The other end stays in the bladder. A stent cannot be seen from the outside. Usually it does not keep you from going about normal routines.  A ureteral stent is used to bypass a blockage in your kidney or ureter. This blockage can be caused by kidney stones, scar tissue, pregnancy, or other causes. It can also be used during treatment to remove a kidney stone or to let a ureter heal after surgery. The stent allows urine to drain from the kidney into the bladder. It is most often taken out after the blockage has been removed or the ureter has healed. If a stent is needed for a long time, it will be changed every few months.  INSERTING THE STENT  Your stent is put in by a urologist. This is a medical doctor trained for treating genitourinary (kidney, ureter and bladder) problems. Before your stent is put in, your caregiver may order x-rays or other imaging tests of your kidneys and ureters. The stent is inserted in a hospital or same day surgical center. You can anticipate going home the same day.  PROCEDURE   A special x-ray machine called a fluoroscope is used to guide the insertion of your stent. This allows your doctor to make sure the stent is in the correct place.   First you are given anesthesia to keep you comfortable.   Then your doctor inserts a special lighted instrument called a cystoscope into your bladder. This allows your doctor to see the opening to the ureter.   A thin wire is carefully threaded into the bladder and up the ureter. The stent is inserted over the wire and the wire is then removed.  HOME CARE INSTRUCTIONS    While the stent is in place, you may feel some discomfort. Certain movements may trigger pain or a feeling that you need to  urinate. Your caregiver may give you pain medication. Only take over-the-counter or prescription medicines for pain, discomfort, or fever as directed by your caregiver. Do not take aspirin as this can make bleeding worse.   You may be given medications to prevent infection or bladder spasms. Be sure to take all medications as directed.   Drink plenty of fluids.   You may have small amounts of bleeding causing your urine to be slightly red. This is nothing to be concerned about.  REMOVAL OF THE STENT  Your stent is left in until the blockage is resolved. This may take two weeks or longer. Before the stent is removed, you may have an x-ray make sure the ureter is open. The stent can be removed by your caregiver in the office. Medications may be given for comfort. Be sure to keep all follow-up appointments so your caregiver can check that you are healing properly.  SEEK IMMEDIATE MEDICAL CARE IF:    Your urine is dark red or has blood clots.   You are incontinent (leaking urine).   You have an oral temperature above 102 F (38.9 C), chills, nausea (feeling sick to your stomach), or vomiting.   Your pain is not relieved by pain medication. Do not take aspirin as this can make bleeding worse.   The end of the stent comes   out of the urethra.  Document Released: 05/12/2000 Document Revised: 05/04/2011 Document Reviewed: 05/11/2008  [employer] Patient Information 2012 [employer].

## 2011-11-20 NOTE — Brief Op Note (Signed)
11/20/2011  7:56 AM  PATIENT:  [name]  61 y.o. female  PRE-OPERATIVE DIAGNOSIS:  Left Renal Stones  POST-OPERATIVE DIAGNOSIS:  Left Renal Stones  PROCEDURE:  Procedure(s) (LRB): CYSTOSCOPY WITH STENT PLACEMENT (Left)  SURGEON:  Surgeon(s) and Role:    * Anner Crete, MD - Primary  PHYSICIAN ASSISTANT:   ASSISTANTS: none   ANESTHESIA:   general  EBL:     BLOOD ADMINISTERED:none  DRAINS: 6x26 left JJ stent   LOCAL MEDICATIONS USED:  NONE  SPECIMEN:  No Specimen  DISPOSITION OF SPECIMEN:  N/A  COUNTS:  YES  TOURNIQUET:  * No tourniquets in log *  DICTATION: .Other Dictation: Dictation Number [id number]  PLAN OF CARE: Discharge to home after PACU  PATIENT DISPOSITION:  PACU - hemodynamically stable.   Delay start of Pharmacological VTE agent (>24hrs) due to surgical blood loss or risk of bleeding: no

## 2011-11-20 NOTE — Transfer of Care (Signed)
Immediate Anesthesia Transfer of Care Note  Patient: [name]  Procedure(s) Performed: Procedure(s) (LRB): CYSTOSCOPY WITH STENT PLACEMENT (Left)  Patient Location: PACU  Anesthesia Type: General  Level of Consciousness: sedated, patient cooperative and responds to stimulaton  Airway & Oxygen Therapy: Patient Spontanous Breathing and Patient connected to face mask oxgen  Post-op Assessment: Report given to PACU RN and Post -op Vital signs reviewed and stable  Post vital signs: Reviewed and stable  Complications: No apparent anesthesia complications

## 2011-11-20 NOTE — H&P (Signed)
ive Problems Problems  1. Microscopic Hematuria 599.72 2. Nephrolithiasis 592.0 3. Pyuria 791.9 4. Renal Colic 788.0  History of Present Illness  [name] returns today for a left renal stone in the renal pelvis that is 18.7mm on CT.   She has had prior stones and was on potassium citrate but had to stop it because of her colon resection and ileostomy creation.  She is having left flank pain that is intermittantly severe.  She has an infected looking uring today but has had no fever.60 y/o white female with h/o nephrolithiasis and chronic cystitis presents for acute evaluation of left flank pain.    She is s/p a very prolonged hospitalization in 04/2011.  She ended up having a diverted ileostomy and then had a lot of issues with regard to the stoma.    She also ended up with pneumonia and PE postoperatively.  She is now on chronic coumadin.   She is anticipating ileostomy reversal later this summer.  She has had calcium oxalate stones in the past and has been on Urocit-K for stone prevention. She has had ureteroscopy x1 and lithotripsy x2 in the past. She has also passed multiple stones spontaneously.   Past Medical History Problems  1. History of  Anxiety (Symptom) 300.00 2. History of  Arthritis V13.4 3. History of  Asthma 493.90 4. History of  Colonic Diverticulosis 562.10 5. History of  Depression 311 6. History of  Heartburn 787.1 7. History of  Hydronephrosis Left 591 8. History of  Hypercholesterolemia 272.0 9. History of  Hypertension 401.9 10. History of  Hypothyroidism 244.9 11. History of  Lumbago 724.2 12. History of  Nephrolithiasis V13.01 13. History of  Nephrolithiasis Of The Right Kidney V13.01 14. History of  Proximal Ureteral Stone On The Left 592.1 15. History of  Restless Legs Syndrome 333.94 16. History of  Thyroid Cancer V10.87 17. Tobacco Use V15.82 18. History of  Ureteral Stone Left 592.1 19. History of  Urinary Tract Infection V13.02  Surgical  History Problems  1. History of  Cesarean Section 2. History of  Cystoscopy With Insertion Of Ureteral Stent Left 3. History of  Cystoscopy With Ureteroscopy Left 4. History of  Hysterectomy 5. History of  Ileostomy Revision 6. History of  Lithotripsy 7. History of  Lithotripsy 8. History of  Thyroid Surgery 9. History of  Total Abdominal Colectomy With Ileostomy  Current Meds 1. Ambien 10 MG Oral Tablet; Therapy: (Recorded:27Jun2008) to 2. Cozaar 50 MG Oral Tablet; Therapy: (Recorded:27Jun2008) to 3. Levothyroxine Sodium 150 MCG Oral Tablet; Therapy: 07Apr2013 to 4. Mobic 15 MG Oral Tablet; Therapy: (Recorded:27Jun2008) to 5. Oxycodone-Acetaminophen 5-325 MG Oral Tablet; take 1 or 2 tablets q 4-6 hours prn pain;  Therapy: 14May2013 to (Last Rx:13Jun2013) 6. Proventil HFA 108 (90 Base) MCG/ACT Inhalation Aerosol Solution; Therapy: 23Dec2010 to 7. Sertraline HCl 100 MG Oral Tablet; Therapy: 09Aug2010 to 8. Singulair TABS; Therapy: (Recorded:26Apr2010) to 9. Tamsulosin HCl 0.4 MG Oral Capsule; TAKE 1 CAPSULE Daily to facilitate stone passage;  Therapy: 11Dec2012 to (Evaluate:12Aug2013)  Requested for: 14May2013; Last Rx:14May2013 10. Vitamin D (Ergocalciferol) 50000 UNIT Oral Capsule; Therapy: 05Apr2013 to  Allergies Medication  1. Provigil TABS  Family History Problems  1. Family history of  Family Health Status Number Of Children 1 son  1 daughter  Social History Problems  1. Caffeine Use 2 cups in the am 2. Family history of  Death In The Family Father 3. Family history of  Death In The Family Mother died age   71 of lung disease 4. Marital History - Currently Married 5. 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 History of  Alcohol Use  Review of Systems Genitourinary, constitutional, skin, eye, otolaryngeal, hematologic/lymphatic, cardiovascular, pulmonary, endocrine, musculoskeletal, gastrointestinal, neurological and psychiatric system(s) were reviewed  and pertinent findings if present are noted.  Genitourinary: hematuria, but no dysuria.  Gastrointestinal: no nausea and no flank pain.  Constitutional: no fever.    Physical Exam Constitutional: Well nourished and well developed . No acute distress.  Cardiovascular: Heart rate and rhythm are normal . No peripheral edema.    Results/Data Urine [Data Includes: Last 1 Day]   19Jun2013  COLOR YELLOW   APPEARANCE CLOUDY   SPECIFIC GRAVITY 1.020   pH 6.0   GLUCOSE NEG mg/dL  BILIRUBIN NEG   KETONE NEG mg/dL  BLOOD LARGE   PROTEIN 100 mg/dL  UROBILINOGEN 0.2 mg/dL  NITRITE POS   LEUKOCYTE ESTERASE MOD   SQUAMOUS EPITHELIAL/HPF RARE   WBC TNTC WBC/hpf  RBC 11-20 RBC/hpf  BACTERIA MANY   CRYSTALS NONE SEEN   CASTS NONE SEEN    The following images/tracing/specimen were independently visualized:  KUB today shows 2 stones overlying the left renal pelvis consistent with the 19mm stone seen on her recent CT. She has a small right renal stone.    Assessment  She has a 19mm non-obstructing left renal stone with a probable infection.   Plan Health Maintenance (V70.0)  1. UA With REFLEX  Done: 19Jun2013 01:33PM Nephrolithiasis (592.0)  2. Follow-up Schedule Surgery Office  Follow-up  Requested for: 19Jun2013 Pyuria (791.9)  3. URINE CULTURE  Requested for: 19Jun2013 Renal Colic (788.0)  4. Oxycodone-Acetaminophen 5-325 MG Oral Tablet; take 1 or 2 tablets q 4-6 hours prn pain;  Therapy: 14May2013 to (Last Rx:19Jun2013)   Urine culture today.   I will call in antibiotics as needed. She will be set up for cystoscopy with left stent followed by ESWL.  I reviewed the risks. I discussed PCNL, but that might be more difficult because she is not obstructed.   

## 2011-11-20 NOTE — Anesthesia Preprocedure Evaluation (Signed)
Anesthesia Evaluation  Patient identified by MRN, date of birth, ID band Patient awake    Reviewed: Allergy & Precautions, H&P , NPO status , Patient's Chart, lab work & pertinent test results  Airway Mallampati: II TM Distance: >3 FB Neck ROM: Full    Dental  (+) Teeth Intact and Dental Advisory Given   Pulmonary asthma , pneumonia ,  breath sounds clear to auscultation  Pulmonary exam normal       Cardiovascular hypertension, Pt. on medications Rhythm:Regular Rate:Normal     Neuro/Psych PSYCHIATRIC DISORDERS Depression negative neurological ROS     GI/Hepatic negative GI ROS, Neg liver ROS,   Endo/Other  Hypothyroidism   Renal/GU Renal InsufficiencyRenal disease  negative genitourinary   Musculoskeletal negative musculoskeletal ROS (+)   Abdominal   Peds negative pediatric ROS (+)  Hematology negative hematology ROS (+)   Anesthesia Other Findings   Reproductive/Obstetrics negative OB ROS                           Anesthesia Physical Anesthesia Plan  ASA: II  Anesthesia Plan: General   Post-op Pain Management:    Induction: Intravenous  Airway Management Planned: LMA  Additional Equipment:   Intra-op Plan:   Post-operative Plan: Extubation in OR  Informed Consent: I have reviewed the patients History and Physical, chart, labs and discussed the procedure including the risks, benefits and alternatives for the proposed anesthesia with the patient or authorized representative who has indicated his/her understanding and acceptance.   Dental advisory given  Plan Discussed with: CRNA  Anesthesia Plan Comments:         Anesthesia Quick Evaluation

## 2011-11-20 NOTE — Anesthesia Postprocedure Evaluation (Signed)
Anesthesia Post Note  Patient: [name]  Procedure(s) Performed: Procedure(s) (LRB): CYSTOSCOPY WITH STENT PLACEMENT (Left)  Anesthesia type: General  Patient location: PACU  Post pain: Pain level controlled  Post assessment: Post-op Vital signs reviewed  Last Vitals:  Filed Vitals:   11/20/11 0815  BP: 118/75  Pulse: 94  Temp:   Resp: 15    Post vital signs: Reviewed  Level of consciousness: sedated  Complications: No apparent anesthesia complications

## 2011-11-20 NOTE — Interval H&P Note (Signed)
History and Physical Interval Note:  11/20/2011 7:25 AM  [name]  has presented today for surgery, with the diagnosis of Left Renal Stones  The various methods of treatment have been discussed with the patient and family. After consideration of risks, benefits and other options for treatment, the patient has consented to  Procedure(s) (LRB): CYSTOSCOPY WITH STENT PLACEMENT (Left) as a surgical intervention .  The patient's history has been reviewed, patient examined, no change in status, stable for surgery.  I have reviewed the patients' chart and labs.  Questions were answered to the patient's satisfaction.     [name]  I will not be able to do her ESWL today.  Her culture was positive.   I will give her rocephin prior to stent placement and will send her home on bactrim and will reschedule the ESWL for later this week or early next week.

## 2011-11-20 NOTE — Progress Notes (Signed)
Patient's Lithotripsy was cancelled for today because pt has a UTI. She is going home today and take Bactrum . She has been rescheduled for Lithotripsy 11/27/11 and all instructions were given to patient

## 2011-11-23 ENCOUNTER — Telehealth: Payer: Self-pay | Admitting: Family

## 2011-11-23 NOTE — Telephone Encounter (Signed)
Patient needs to have her INR/Coumadin level checked. She is overdue.

## 2011-11-26 ENCOUNTER — Other Ambulatory Visit: Payer: Self-pay | Admitting: Family Medicine

## 2011-11-26 MED ORDER — DEXTROSE 5 % IV SOLN
2.0000 g | INTRAVENOUS | Status: AC
  Administered 2011-11-27: 2 g via INTRAVENOUS
  Filled 2011-11-26: qty 2

## 2011-11-26 MED ORDER — DEXTROSE 5 % IV SOLN
1.0000 g | Freq: Once | INTRAVENOUS | Status: DC
  Filled 2011-11-26: qty 10

## 2011-11-27 ENCOUNTER — Ambulatory Visit (HOSPITAL_COMMUNITY)
Admission: RE | Admit: 2011-11-27 | Discharge: 2011-11-27 | Disposition: A | Discharge status: Home / Self Care | Payer: Managed Care, Other (non HMO) | Source: Ambulatory Visit | Attending: Urology | Admitting: Urology

## 2011-11-27 ENCOUNTER — Encounter (HOSPITAL_COMMUNITY)
Admission: RE | Disposition: A | Discharge status: Home / Self Care | Payer: Self-pay | Source: Ambulatory Visit | Attending: Urology

## 2011-11-27 ENCOUNTER — Ambulatory Visit (HOSPITAL_COMMUNITY): Payer: Managed Care, Other (non HMO)

## 2011-11-27 DIAGNOSIS — I10 Essential (primary) hypertension: Secondary | ICD-10-CM | POA: Insufficient documentation

## 2011-11-27 DIAGNOSIS — E7800 Pure hypercholesterolemia, unspecified: Secondary | ICD-10-CM | POA: Insufficient documentation

## 2011-11-27 DIAGNOSIS — E039 Hypothyroidism, unspecified: Secondary | ICD-10-CM | POA: Insufficient documentation

## 2011-11-27 DIAGNOSIS — J45909 Unspecified asthma, uncomplicated: Secondary | ICD-10-CM | POA: Insufficient documentation

## 2011-11-27 DIAGNOSIS — N200 Calculus of kidney: Secondary | ICD-10-CM | POA: Insufficient documentation

## 2011-11-27 DIAGNOSIS — Z932 Ileostomy status: Secondary | ICD-10-CM | POA: Insufficient documentation

## 2011-11-27 DIAGNOSIS — Z79899 Other long term (current) drug therapy: Secondary | ICD-10-CM | POA: Insufficient documentation

## 2011-11-27 SURGERY — LITHOTRIPSY, ESWL
Anesthesia: LOCAL | Laterality: Left

## 2011-11-27 MED ORDER — DIPHENHYDRAMINE HCL 25 MG PO CAPS
25.0000 mg | ORAL_CAPSULE | ORAL | Status: AC
  Administered 2011-11-27: 25 mg via ORAL

## 2011-11-27 MED ORDER — DIPHENHYDRAMINE HCL 25 MG PO CAPS
ORAL_CAPSULE | ORAL | Status: AC
  Administered 2011-11-27: 25 mg via ORAL
  Filled 2011-11-27: qty 1

## 2011-11-27 MED ORDER — DEXTROSE-NACL 5-0.45 % IV SOLN
INTRAVENOUS | Status: DC
  Administered 2011-11-27 (×2): via INTRAVENOUS

## 2011-11-27 MED ORDER — DIAZEPAM 5 MG PO TABS
ORAL_TABLET | ORAL | Status: AC
  Administered 2011-11-27: 10 mg via ORAL
  Filled 2011-11-27: qty 2

## 2011-11-27 MED ORDER — OXYCODONE-ACETAMINOPHEN 5-325 MG PO TABS: 1.0000 | ORAL_TABLET | Freq: Four times a day (QID) | ORAL | Status: DC | PRN

## 2011-11-27 MED ORDER — DIAZEPAM 5 MG PO TABS
10.0000 mg | ORAL_TABLET | ORAL | Status: AC
  Administered 2011-11-27: 10 mg via ORAL

## 2011-11-27 NOTE — Interval H&P Note (Signed)
History and Physical Interval Note:  11/27/2011 7:12 AM  [name]  has presented today for surgery, with the diagnosis of left renal stones  The various methods of treatment have been discussed with the patient and family. After consideration of risks, benefits and other options for treatment, the patient has consented to  Procedure(s) (LRB): EXTRACORPOREAL SHOCK WAVE LITHOTRIPSY (ESWL) (Left) as a surgical intervention .  The patient's history has been reviewed, patient examined, no change in status, stable for surgery.  I have reviewed the patients' chart and labs.  Questions were answered to the patient's satisfaction.     Zahari Fazzino J

## 2011-11-27 NOTE — Telephone Encounter (Signed)
Refill for 6 months. 

## 2011-11-27 NOTE — Discharge Instructions (Signed)
Lithotripsy Care After Refer to this sheet for the next few weeks. These discharge instructions provide you with general information on caring for yourself after you leave the hospital. Your caregiver may also give you specific instructions. Your treatment has been planned according to the most current medical practices available, but unavoidable complications sometimes occur. If you have any problems or questions after discharge, please call your caregiver. AFTER THE PROCEDURE   The recovery time will vary with the procedure done.   You will be taken to the recovery area. A nurse will watch and check your progress. Once you are awake, stable, and taking fluids well, you will be allowed to go home as long as there are no problems.   Your urine may have a red tinge for a few days after treatment. Blood loss is usually minimal.   You may have soreness in the back or flank area. This usually goes away after a few days. The procedure can cause blotches or bruises on the back where the pressure wave enters the skin. These marks usually cause only minimal discomfort and should disappear in a short time.   Stone fragments should begin to pass within 24 hours of treatment. However, a delayed passage is not unusual.   You may have pain, discomfort, and feel sick to your stomach (nauseous) when the crushed (pulverized) fragments of stone are passed down the tube from the kidney to the bladder. Stone fragments can pass soon after the procedure and may last for up to 4 to 8 weeks.   A small number of patients may have severe pain when stone fragments are not able to pass, which leads to an obstruction.   If your stone is greater than 1 inch/2.5 centimeters in diameter or if you have multiple stones that have a combined diameter greater than 1 inch/2.5 centimeters, you may require more than 1 treatment.   You must have someone drive you home.  HOME CARE INSTRUCTIONS   Rest at home until you feel your  energy improving.   Only take over-the-counter or prescription medicines for pain, discomfort, or fever as directed by your caregiver. Depending on the type of lithotripsy, you may need to take medicines (antibiotics) that kill germs and anti-inflammatory medicines for a few days.   Drink enough water and fluids to keep your urine clear or pale yellow. This helps "flush" your kidneys. It helps pass any remaining pieces of stone and prevents stones from coming back.   Most people can resume daily activities within 1 or 2 days after standard lithotripsy. It can take longer to recover from laser and percutaneous lithotripsy.   If the stones are in your urinary system, you may be asked to strain your urine at home to look for stones. Any stones that are found can be sent to a medical lab for examination.   Visit your caregiver for a follow-up appointment in a few weeks. Your doctor may remove your stent if you have one. Your caregiver will also check to see whether stone particles still remain.  SEEK MEDICAL CARE IF:   You have an oral temperature above 102 F (38.9 C).   Your pain is not relieved by medicine.   You have a lasting nauseous feeling.   You feel there is too much blood in the urine.   You develop persistent problems with frequent and/or painful urination that does not at least partially improve after 2 days following the procedure.   You have a congested   cough.   You feel lightheaded.   You develop a rash or any other signs that might suggest an allergic problem.   You develop any reaction or side effects to your medicine(s).  SEEK IMMEDIATE MEDICAL CARE IF:   You experience severe back and/or flank pain.   You see nothing but blood when you urinate.   You cannot pass any urine at all.   You have an oral temperature above 102 F (38.9 C), not controlled by medicine.   You develop shortness of breath, difficulty breathing, or chest pain.   You develop vomiting  that will not stop after 6 to 8 hours.   You have a fainting episode.  MAKE SURE YOU:   Understand these instructions.   Will watch your condition.   Will get help right away if you are not doing well or get worse.  Document Released: 06/04/2007 Document Revised: 05/04/2011 Document Reviewed: 06/04/2007 [facility] Patient Information 2012 [place], [place].  Hold Meloxicam for 2 days.

## 2011-11-27 NOTE — H&P (View-Only) (Signed)
ive Problems Problems  1. Microscopic Hematuria 599.72 2. Nephrolithiasis 592.0 3. Pyuria 791.9 4. Renal Colic 788.0  History of Present Illness  [name] returns today for a left renal stone in the renal pelvis that is 18.78mm on CT.   She has had prior stones and was on potassium citrate but had to stop it because of her colon resection and ileostomy creation.  She is having left flank pain that is intermittantly severe.  She has an infected looking uring today but has had no fever.61 y/o white female with h/o nephrolithiasis and chronic cystitis presents for acute evaluation of left flank pain.    She is s/p a very prolonged hospitalization in 04/2011.  She ended up having a diverted ileostomy and then had a lot of issues with regard to the stoma.    She also ended up with pneumonia and PE postoperatively.  She is now on chronic coumadin.   She is anticipating ileostomy reversal later this summer.  She has had calcium oxalate stones in the past and has been on Urocit-K for stone prevention. She has had ureteroscopy x1 and lithotripsy x2 in the past. She has also passed multiple stones spontaneously.   Past Medical History Problems  1. History of  Anxiety (Symptom) 300.00 2. History of  Arthritis V13.4 3. History of  Asthma 493.90 4. History of  Colonic Diverticulosis 562.10 5. History of  Depression 311 6. History of  Heartburn 787.1 7. History of  Hydronephrosis Left 591 8. History of  Hypercholesterolemia 272.0 9. History of  Hypertension 401.9 10. History of  Hypothyroidism 244.9 11. History of  Lumbago 724.2 12. History of  Nephrolithiasis V13.01 13. History of  Nephrolithiasis Of The Right Kidney V13.01 14. History of  Proximal Ureteral Stone On The Left 592.1 15. History of  Restless Legs Syndrome 333.94 16. History of  Thyroid Cancer V10.87 17. Tobacco Use V15.82 18. History of  Ureteral Stone Left 592.1 19. History of  Urinary Tract Infection V13.02  Surgical  History Problems  1. History of  Cesarean Section 2. History of  Cystoscopy With Insertion Of Ureteral Stent Left 3. History of  Cystoscopy With Ureteroscopy Left 4. History of  Hysterectomy 5. History of  Ileostomy Revision 6. History of  Lithotripsy 7. History of  Lithotripsy 8. History of  Thyroid Surgery 9. History of  Total Abdominal Colectomy With Ileostomy  Current Meds 1. Ambien 10 MG Oral Tablet; Therapy: (Recorded:27Jun2008) to 2. Cozaar 50 MG Oral Tablet; Therapy: (Recorded:27Jun2008) to 3. Levothyroxine Sodium 150 MCG Oral Tablet; Therapy: 07Apr2013 to 4. Mobic 15 MG Oral Tablet; Therapy: (Recorded:27Jun2008) to 5. Oxycodone-Acetaminophen 5-325 MG Oral Tablet; take 1 or 2 tablets q 4-6 hours prn pain;  Therapy: 14May2013 to (Last Rx:13Jun2013) 6. Proventil HFA 108 (90 Base) MCG/ACT Inhalation Aerosol Solution; Therapy: 23Dec2010 to 7. Sertraline HCl 100 MG Oral Tablet; Therapy: 09Aug2010 to 8. Singulair TABS; Therapy: (Recorded:26Apr2010) to 9. Tamsulosin HCl 0.4 MG Oral Capsule; TAKE 1 CAPSULE Daily to facilitate stone passage;  Therapy: 11Dec2012 to (Evaluate:12Aug2013)  Requested for: 14May2013; Last Rx:14May2013 10. Vitamin D (Ergocalciferol) 50000 UNIT Oral Capsule; Therapy: 05Apr2013 to  Allergies Medication  1. Provigil TABS  Family History Problems  1. Family history of  Family Health Status Number Of Children 1 son  1 daughter  Social History Problems  1. Caffeine Use 2 cups in the am 2. Family history of  Death In The Family Father 3. Family history of  Death In The Family Mother died age  71 of lung disease 4. Marital History - Currently Married 5. 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 History of  Alcohol Use  Review of Systems Genitourinary, constitutional, skin, eye, otolaryngeal, hematologic/lymphatic, cardiovascular, pulmonary, endocrine, musculoskeletal, gastrointestinal, neurological and psychiatric system(s) were reviewed  and pertinent findings if present are noted.  Genitourinary: hematuria, but no dysuria.  Gastrointestinal: no nausea and no flank pain.  Constitutional: no fever.    Physical Exam Constitutional: Well nourished and well developed . No acute distress.  Cardiovascular: Heart rate and rhythm are normal . No peripheral edema.    Results/Data Urine [Data Includes: Last 1 Day]   19Jun2013  COLOR YELLOW   APPEARANCE CLOUDY   SPECIFIC GRAVITY 1.020   pH 6.0   GLUCOSE NEG mg/dL  BILIRUBIN NEG   KETONE NEG mg/dL  BLOOD LARGE   PROTEIN 100 mg/dL  UROBILINOGEN 0.2 mg/dL  NITRITE POS   LEUKOCYTE ESTERASE MOD   SQUAMOUS EPITHELIAL/HPF RARE   WBC TNTC WBC/hpf  RBC 11-20 RBC/hpf  BACTERIA MANY   CRYSTALS NONE SEEN   CASTS NONE SEEN    The following images/tracing/specimen were independently visualized:  KUB today shows 2 stones overlying the left renal pelvis consistent with the 19mm stone seen on her recent CT. She has a small right renal stone.    Assessment  She has a 19mm non-obstructing left renal stone with a probable infection.   Plan Health Maintenance (V70.0)  1. UA With REFLEX  Done: 19Jun2013 01:33PM Nephrolithiasis (592.0)  2. Follow-up Schedule Surgery Office  Follow-up  Requested for: 19Jun2013 Pyuria (791.9)  3. URINE CULTURE  Requested for: 19Jun2013 Renal Colic (788.0)  4. Oxycodone-Acetaminophen 5-325 MG Oral Tablet; take 1 or 2 tablets q 4-6 hours prn pain;  Therapy: 14May2013 to (Last Rx:19Jun2013)   Urine culture today.   I will call in antibiotics as needed. She will be set up for cystoscopy with left stent followed by ESWL.  I reviewed the risks. I discussed PCNL, but that might be more difficult because she is not obstructed.

## 2011-11-27 NOTE — Interval H&P Note (Signed)
History and Physical Interval Note:  11/27/2011 7:35 AM  [name]  has presented today for surgery, with the diagnosis of left renal stones  The various methods of treatment have been discussed with the patient and family. After consideration of risks, benefits and other options for treatment, the patient has consented to  Procedure(s) (LRB): EXTRACORPOREAL SHOCK WAVE LITHOTRIPSY (ESWL) (Left) as a surgical intervention .  The patient's history has been reviewed, patient examined, no change in status, stable for surgery.  I have reviewed the patients' chart and labs.  Questions were answered to the patient's satisfaction.     [name] J  Patient marked on left.

## 2011-11-27 NOTE — Telephone Encounter (Signed)
Ambien at bedtime prn last filled 05-26-11, #30 with 5 refills

## 2011-12-11 ENCOUNTER — Other Ambulatory Visit: Payer: Self-pay | Admitting: Family Medicine

## 2011-12-11 ENCOUNTER — Encounter (INDEPENDENT_AMBULATORY_CARE_PROVIDER_SITE_OTHER): Payer: Self-pay | Admitting: General Surgery

## 2011-12-11 ENCOUNTER — Ambulatory Visit (INDEPENDENT_AMBULATORY_CARE_PROVIDER_SITE_OTHER): Payer: Managed Care, Other (non HMO) | Admitting: General Surgery

## 2011-12-11 VITALS — BP 106/70 | HR 68 | Temp 97.3°F | Resp 14 | Ht 68.0 in | Wt 164.2 lb

## 2011-12-11 DIAGNOSIS — Z932 Ileostomy status: Secondary | ICD-10-CM

## 2011-12-11 NOTE — Patient Instructions (Signed)
[facility]  ONE-DAY (1) PRE-OP HOME COLON PREP INSTRUCTIONS: ** MIRALAX / GATORADE PREP **  Fill the two prescriptions at a pharmacy of your choice.  You must follow the instructions below carefully.  If you have questions or problems, please call and speak to someone in the clinic department at our office:   [phone].  MIRALAX - GATORADE -- DULCOLAX TABS:   Fill the prescriptions for MIRALAX  (255 gm bottle)    In addition, purchaseone 64 oz GATORADE.  (Do NOT purchase red Gatorade; any other flavor is acceptable).      INSTRUCTIONS: 1. Five days prior to your procedure do not eat nuts, popcorn, or fruit with seeds.  Stop all fiber supplements such as Metamucil, Citrucel, etc.  2. The day before your procedure: o 6:00am:  take (4) Dulcolax tablets.  You should remain on clear liquids for the entire day.   CLEAR LIQUIDS: clear bouillon, broth, jello (NOT RED), black coffee, tea, soda, etc o 10:00am:  add the bottle of MiraLax to the 64-oz bottle of Gatorade, and dissolve.  Begin drinking the Gatorade mixture until gone (8 oz every 15-30 minutes).  Continue clear liquids until midnight (or bedtime). o   3. The day of your procedure:   Do not eat or drink ANYTHING after midnight before your surgery.     If you take Heart or Blood Pressure medicine, ask the pre-op nurses about these during your preop appointment.   Further pre-operative instructions will be given to you from the hospital.   Expect to be contacted 5-7 days before your surgery.

## 2011-12-11 NOTE — Progress Notes (Signed)
Patient ID: [name], female   DOB: Jan 04, 1951, 61 y.o.   MRN: [id number]  Chief Complaint  Patient presents with  . Routine Post Op    Recheck ileostomy    HPI [name] is a 61 y.o. female.   HPI  She is here to discuss end ileostomy reversal. Her history is well documented in previous notes. She underwent abdominal colectomy with ileum to rectal anastomosis in October of 2012. This was complicated by anastomotic leak that was treated with diverting loop ileostomy and drainage. The loop ileostomy was closed and then she had a leak from the closure requiring exploratory laparotomy and end ileostomy December 2012. This was complicated by pneumonia and superior mesenteric vein thrombosis. She is completed her Coumadin treatment. She had a kidney stone treated by Ferrell of stent and lithotripsy. She is now ready for ileostomy reversal.  Past Medical History  Diagnosis Date  . HYPOTHYROIDISM 10/06/2008  . HYPERLIPIDEMIA 10/06/2008  . DEPRESSION 02/23/2010  . HYPERTENSION 10/06/2008  . DIVERTICULITIS, ACUTE 11/10/2008  . ALLERGIC RHINITIS 07/23/2009  . Diverticulosis of colon 11/10/2008  . Asthma 11-17-11    no flareups in adult yrs. Albuterol used rarely  . 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 Coumadin-all resolved. No Coumadin in 9 days  . Chronic kidney disease 11-17-11    hx. past multiple kidney stones, prev. lithos  . Pneumonia 11-17-11    1'13 postop pneumonia.   . 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 Thyroid surgery 1982    tumor removed  . Cesarean section 1983  . Rotator cuff repair 11-17-11    left '05  . Kidney stone surgery 2000, 2009, 2010  . Laparoscopic assissted total colectomy w/ Ferrell-pouch 03/01/11  . Ileostomy closure 05/02/2011    Procedure: ILEOSTOMY TAKEDOWN;  Surgeon: Mariella Saa, MD;  Location: [facility];  Service: General;  Laterality: N/A;  . Laparotomy 05/14/2011    Procedure:  EXPLORATORY LAPAROTOMY;  Surgeon: Valarie Merino, MD;  Location: [facility];  Service: General;  Laterality: N/A;  small bowel resection end ileostomy  . Abdominal hysterectomy 1986    fibroids    Family History  Problem Relation Age of Onset  . Arthritis Other   . Mental illness Other   . COPD Mother   . Depression Mother   . Deep vein thrombosis Father   . Depression Father     Social History History  Substance Use Topics  . Smoking status: Former Smoker -- 1.0 packs/day for 10 years    Types: Cigarettes    Quit date: 08/02/1980  . Smokeless tobacco: Never Used  . Alcohol Use: No    Allergies  Allergen Reactions  . Modafinil Rash    Under breasts only.    Current Outpatient Prescriptions  Medication Sig Dispense Refill  . sulfamethoxazole-trimethoprim (BACTRIM DS) 800-160 MG per tablet Daily. Until completed.      Marland Kitchen albuterol (PROVENTIL HFA) 108 (90 BASE) MCG/ACT inhaler Inhale 2 puffs into the lungs every 6 (six) hours as needed.  1 Inhaler  5  . loratadine (CLARITIN) 10 MG tablet Take 10 mg by mouth daily.      Marland Kitchen losartan (COZAAR) 50 MG tablet TAKE 1 TABLET BY MOUTH ONCE DAILY  30 tablet  11  . meloxicam (MOBIC) 15 MG tablet take 1 tablet by mouth once daily  30 tablet  11  . montelukast (SINGULAIR) 10  MG tablet Take 10 mg by mouth at bedtime.       . Multiple Vitamin (MULTIVITAMIN) tablet Take 1 tablet by mouth daily.      Marland Kitchen oxyCODONE-acetaminophen (PERCOCET) 5-325 MG per tablet Take 1-2 tablets by mouth every 6 (six) hours as needed for pain.  30 tablet  0  . sertraline (ZOLOFT) 100 MG tablet Take 100 mg by mouth daily.      Marland Kitchen SYNTHROID 137 MCG tablet Take 1 tablet (137 mcg total) by mouth daily.  90 tablet  0  . Tamsulosin HCl (FLOMAX) 0.4 MG CAPS Take 0.4 mg by mouth daily.       . Vitamin D, Ergocalciferol, (DRISDOL) 50000 UNITS CAPS Take 1 capsule (50,000 Units total) by mouth every 7 (seven) days.  16 capsule  0  . zolpidem (AMBIEN) 10 MG tablet take 1 tablet by  mouth at bedtime if needed  30 tablet  5  . DISCONTD: losartan (COZAAR) 50 MG tablet TAKE 1 TABLET BY MOUTH ONCE DAILY  30 tablet  11    Review of Systems Review of Systems  Constitutional: Negative for fever and chills.  HENT: Positive for congestion.   Respiratory: Negative for shortness of breath.   Genitourinary:       Passing some stone fragments in her urine.  The stent is out.    Blood pressure 106/70, pulse 68, temperature 97.3 F (36.3 C), temperature source Temporal, resp. rate 14, height 5\' 8"  (1.727 m), weight 164 lb 4 oz (74.503 kg).  Physical Exam Physical Exam  Constitutional: She appears well-developed and well-nourished. No distress.  HENT:  Head: Normocephalic and atraumatic.  Cardiovascular: Normal rate and regular rhythm.   Pulmonary/Chest: Effort normal and breath sounds normal.  Abdominal: Soft. She exhibits no mass. There is no tenderness.       Midline scar with central weakness.  RLQ scar.  LUQ ileostomy.    Data Reviewed Previous notes.  Assessment    Ileostomy-ready for reversal.    Plan    Laparoscopic possible open ileostomy closure. We discussed the procedure, risks, and aftercare at length. The risks include but are not limited to bleeding, infection, wound problems, anesthesia, anastomotic leak and needing another ileostomy, injury to intra-abdominal organs, postoperative ileus. She and her husband seem to understand all of this and would like to proceed.      [name] 12/11/2011, 2:19 PM

## 2011-12-15 ENCOUNTER — Other Ambulatory Visit: Payer: Self-pay | Admitting: Family Medicine

## 2011-12-19 ENCOUNTER — Ambulatory Visit: Payer: Self-pay | Admitting: Family

## 2011-12-19 DIAGNOSIS — I82409 Acute embolism and thrombosis of unspecified deep veins of unspecified lower extremity: Secondary | ICD-10-CM

## 2011-12-19 NOTE — Patient Instructions (Signed)
Coumadin Therapy is complete 12/11/2011

## 2012-01-04 ENCOUNTER — Encounter (HOSPITAL_COMMUNITY): Payer: Self-pay | Admitting: Pharmacy Technician

## 2012-01-12 ENCOUNTER — Encounter (HOSPITAL_COMMUNITY): Payer: Self-pay

## 2012-01-12 ENCOUNTER — Encounter (HOSPITAL_COMMUNITY)
Admission: RE | Admit: 2012-01-12 | Discharge: 2012-01-12 | Disposition: A | Discharge status: Home / Self Care | Payer: Managed Care, Other (non HMO) | Source: Ambulatory Visit | Attending: General Surgery | Admitting: General Surgery

## 2012-01-12 ENCOUNTER — Ambulatory Visit (HOSPITAL_COMMUNITY)
Admission: RE | Admit: 2012-01-12 | Discharge: 2012-01-12 | Disposition: A | Discharge status: Home / Self Care | Payer: Managed Care, Other (non HMO) | Source: Ambulatory Visit | Attending: General Surgery | Admitting: General Surgery

## 2012-01-12 DIAGNOSIS — Z01818 Encounter for other preprocedural examination: Secondary | ICD-10-CM | POA: Insufficient documentation

## 2012-01-12 DIAGNOSIS — Z01812 Encounter for preprocedural laboratory examination: Secondary | ICD-10-CM | POA: Insufficient documentation

## 2012-01-12 HISTORY — DX: Anemia, unspecified: D64.9

## 2012-01-12 HISTORY — DX: Unspecified osteoarthritis, unspecified site: M19.90

## 2012-01-12 LAB — CBC WITH DIFFERENTIAL/PLATELET
Basophils Absolute: 0 10*3/uL (ref 0.0–0.1)
Basophils Relative: 1 % (ref 0–1)
Eosinophils Absolute: 0.2 10*3/uL (ref 0.0–0.7)
Eosinophils Relative: 3 % (ref 0–5)
HCT: 34.5 % — ABNORMAL LOW (ref 36.0–46.0)
Hemoglobin: 11 g/dL — ABNORMAL LOW (ref 12.0–15.0)
MCH: 30.8 pg (ref 26.0–34.0)
MCHC: 31.9 g/dL (ref 30.0–36.0)
MCV: 96.6 fL (ref 78.0–100.0)
Monocytes Absolute: 0.4 10*3/uL (ref 0.1–1.0)
Monocytes Relative: 6 % (ref 3–12)
RDW: 14 % (ref 11.5–15.5)

## 2012-01-12 LAB — COMPREHENSIVE METABOLIC PANEL
AST: 30 U/L (ref 0–37)
Albumin: 4.1 g/dL (ref 3.5–5.2)
BUN: 23 mg/dL (ref 6–23)
Calcium: 10.4 mg/dL (ref 8.4–10.5)
Chloride: 104 mEq/L (ref 96–112)
Creatinine, Ser: 1.03 mg/dL (ref 0.50–1.10)
Total Bilirubin: 0.2 mg/dL — ABNORMAL LOW (ref 0.3–1.2)

## 2012-01-12 LAB — PROTIME-INR
INR: 0.94 (ref 0.00–1.49)
Prothrombin Time: 12.8 seconds (ref 11.6–15.2)

## 2012-01-12 LAB — SURGICAL PCR SCREEN: MRSA, PCR: NEGATIVE

## 2012-01-12 NOTE — Patient Instructions (Addendum)
20 Tarrah Furuta  01/12/2012   Your procedure is scheduled on:  01/18/12 Thursday  Surgery [id number]  Report to [facility] at    0515   AM.  Call this number if you have problems the morning of surgery: [phone]     Or PST   [id number]  Ashley Akin INHALERS WITH YOU TO HOSPITAL  Remember:   Do not eat food:After Midnight. Tuesday NIGHT  May have clear liquids: all day Wednesday until midnight or bedtime then none  Clear liquids include soda, tea, black coffee, apple or grape juice, broth.  Take these medicines the morning of surgery with A SIP OF WATER:Synthroid   Do not wear jewelry, make-up or nail polish.  Do not wear lotions, powders, or perfumes. You may wear deodorant.  Do not shave 48 hours prior to surgery.  Do not bring valuables to the hospital.  Contacts, dentures or bridgework may not be worn into surgery.  Leave suitcase in the car. After surgery it may be brought to your room.  For patients admitted to the hospital, checkout time is 11:00 AM the day of discharge.   Patients discharged the day of surgery will not be allowed to drive home.  Name and phone number of your driver:    husband                                                                  Special Instructions: CHG Shower Use Special Wash: 1/2 bottle night before surgery and 1/2 bottle morning of surgery. REGULAR SOAP FACE AND PRIVATES              LADIES- NO SHAVING 48 HOURS BEFORE USING BETASEPT SOAP.                   Please read over the following fact sheets that you were given: MRSA Information

## 2012-01-18 ENCOUNTER — Encounter (HOSPITAL_COMMUNITY)
Admission: RE | Disposition: A | Discharge status: Home / Self Care | Payer: Self-pay | Source: Ambulatory Visit | Attending: General Surgery

## 2012-01-18 ENCOUNTER — Encounter (HOSPITAL_COMMUNITY): Payer: Self-pay | Admitting: Anesthesiology

## 2012-01-18 ENCOUNTER — Encounter (HOSPITAL_COMMUNITY): Payer: Self-pay | Admitting: *Deleted

## 2012-01-18 ENCOUNTER — Inpatient Hospital Stay (HOSPITAL_COMMUNITY)
Admission: RE | Admit: 2012-01-18 | Discharge: 2012-01-23 | DRG: 330 | Disposition: A | Discharge status: Home / Self Care | Payer: Managed Care, Other (non HMO) | Source: Ambulatory Visit | Attending: General Surgery | Admitting: General Surgery

## 2012-01-18 ENCOUNTER — Ambulatory Visit (HOSPITAL_COMMUNITY): Payer: Managed Care, Other (non HMO) | Admitting: Anesthesiology

## 2012-01-18 DIAGNOSIS — Z932 Ileostomy status: Secondary | ICD-10-CM

## 2012-01-18 DIAGNOSIS — Z888 Allergy status to other drugs, medicaments and biological substances status: Secondary | ICD-10-CM

## 2012-01-18 DIAGNOSIS — Z8719 Personal history of other diseases of the digestive system: Secondary | ICD-10-CM

## 2012-01-18 DIAGNOSIS — E785 Hyperlipidemia, unspecified: Secondary | ICD-10-CM | POA: Diagnosis present

## 2012-01-18 DIAGNOSIS — M1990 Unspecified osteoarthritis, unspecified site: Secondary | ICD-10-CM | POA: Diagnosis present

## 2012-01-18 DIAGNOSIS — Z9049 Acquired absence of other specified parts of digestive tract: Secondary | ICD-10-CM

## 2012-01-18 DIAGNOSIS — K660 Peritoneal adhesions (postprocedural) (postinfection): Secondary | ICD-10-CM

## 2012-01-18 DIAGNOSIS — Y834 Other reconstructive surgery as the cause of abnormal reaction of the patient, or of later complication, without mention of misadventure at the time of the procedure: Secondary | ICD-10-CM | POA: Diagnosis not present

## 2012-01-18 DIAGNOSIS — J309 Allergic rhinitis, unspecified: Secondary | ICD-10-CM | POA: Diagnosis present

## 2012-01-18 DIAGNOSIS — Z87442 Personal history of urinary calculi: Secondary | ICD-10-CM

## 2012-01-18 DIAGNOSIS — K439 Ventral hernia without obstruction or gangrene: Secondary | ICD-10-CM | POA: Diagnosis present

## 2012-01-18 DIAGNOSIS — Z432 Encounter for attention to ileostomy: Principal | ICD-10-CM

## 2012-01-18 DIAGNOSIS — I129 Hypertensive chronic kidney disease with stage 1 through stage 4 chronic kidney disease, or unspecified chronic kidney disease: Secondary | ICD-10-CM | POA: Diagnosis present

## 2012-01-18 DIAGNOSIS — N189 Chronic kidney disease, unspecified: Secondary | ICD-10-CM | POA: Diagnosis present

## 2012-01-18 DIAGNOSIS — Z86718 Personal history of other venous thrombosis and embolism: Secondary | ICD-10-CM

## 2012-01-18 DIAGNOSIS — F329 Major depressive disorder, single episode, unspecified: Secondary | ICD-10-CM | POA: Diagnosis present

## 2012-01-18 DIAGNOSIS — Z8701 Personal history of pneumonia (recurrent): Secondary | ICD-10-CM

## 2012-01-18 DIAGNOSIS — T8140XA Infection following a procedure, unspecified, initial encounter: Secondary | ICD-10-CM | POA: Diagnosis not present

## 2012-01-18 DIAGNOSIS — E039 Hypothyroidism, unspecified: Secondary | ICD-10-CM | POA: Diagnosis present

## 2012-01-18 DIAGNOSIS — F3289 Other specified depressive episodes: Secondary | ICD-10-CM | POA: Diagnosis present

## 2012-01-18 DIAGNOSIS — Z87891 Personal history of nicotine dependence: Secondary | ICD-10-CM

## 2012-01-18 DIAGNOSIS — Z90710 Acquired absence of both cervix and uterus: Secondary | ICD-10-CM

## 2012-01-18 HISTORY — PX: ILEO LOOP COLOSTOMY CLOSURE: SHX5257

## 2012-01-18 LAB — TYPE AND SCREEN
ABO/RH(D): A POS
Antibody Screen: NEGATIVE

## 2012-01-18 SURGERY — CLOSURE, ILEOSTOMY, LAPAROSCOPIC, WITH LAPAROTOMY IF INDICATED
Anesthesia: General | Site: Abdomen

## 2012-01-18 MED ORDER — LIDOCAINE HCL (CARDIAC) 20 MG/ML IV SOLN
INTRAVENOUS | Status: DC | PRN
  Administered 2012-01-18: 75 mg via INTRAVENOUS

## 2012-01-18 MED ORDER — LOSARTAN POTASSIUM 50 MG PO TABS
50.0000 mg | ORAL_TABLET | Freq: Every day | ORAL | Status: DC
  Administered 2012-01-19 – 2012-01-22 (×4): 50 mg via ORAL
  Filled 2012-01-18 (×5): qty 1

## 2012-01-18 MED ORDER — HYDROMORPHONE HCL PF 1 MG/ML IJ SOLN
0.2500 mg | INTRAMUSCULAR | Status: DC | PRN
  Administered 2012-01-18 (×4): 0.5 mg via INTRAVENOUS

## 2012-01-18 MED ORDER — LACTATED RINGERS IV SOLN
INTRAVENOUS | Status: DC | PRN
  Administered 2012-01-18: 1000 mL

## 2012-01-18 MED ORDER — EPHEDRINE SULFATE 50 MG/ML IJ SOLN
INTRAMUSCULAR | Status: DC | PRN
  Administered 2012-01-18: 10 mg via INTRAVENOUS

## 2012-01-18 MED ORDER — GLYCOPYRROLATE 0.2 MG/ML IJ SOLN
INTRAMUSCULAR | Status: DC | PRN
  Administered 2012-01-18: .4 mg via INTRAVENOUS

## 2012-01-18 MED ORDER — MIDAZOLAM HCL 5 MG/5ML IJ SOLN
INTRAMUSCULAR | Status: DC | PRN
  Administered 2012-01-18 (×2): 1 mg via INTRAVENOUS

## 2012-01-18 MED ORDER — ONDANSETRON HCL 4 MG/2ML IJ SOLN
4.0000 mg | INTRAMUSCULAR | Status: DC | PRN
  Filled 2012-01-18: qty 2

## 2012-01-18 MED ORDER — HEPARIN SODIUM (PORCINE) 5000 UNIT/ML IJ SOLN
5000.0000 [IU] | Freq: Three times a day (TID) | INTRAMUSCULAR | Status: DC
  Administered 2012-01-19 – 2012-01-23 (×13): 5000 [IU] via SUBCUTANEOUS
  Filled 2012-01-18 (×16): qty 1

## 2012-01-18 MED ORDER — NEOSTIGMINE METHYLSULFATE 1 MG/ML IJ SOLN
INTRAMUSCULAR | Status: DC | PRN
  Administered 2012-01-18: 3 mg via INTRAVENOUS

## 2012-01-18 MED ORDER — ACETAMINOPHEN 10 MG/ML IV SOLN
INTRAVENOUS | Status: AC
  Filled 2012-01-18: qty 100

## 2012-01-18 MED ORDER — MORPHINE SULFATE 10 MG/ML IJ SOLN
2.0000 mg | INTRAMUSCULAR | Status: DC | PRN
  Administered 2012-01-18 – 2012-01-20 (×10): 4 mg via INTRAVENOUS
  Filled 2012-01-18 (×10): qty 1

## 2012-01-18 MED ORDER — ONDANSETRON HCL 4 MG PO TABS: 4.0000 mg | ORAL_TABLET | Freq: Four times a day (QID) | ORAL | Status: DC | PRN

## 2012-01-18 MED ORDER — LACTATED RINGERS IV SOLN
INTRAVENOUS | Status: DC | PRN
  Administered 2012-01-18 (×4): via INTRAVENOUS

## 2012-01-18 MED ORDER — LACTATED RINGERS IV SOLN
INTRAVENOUS | Status: DC
  Administered 2012-01-18: 13:00:00 via INTRAVENOUS

## 2012-01-18 MED ORDER — ZOLPIDEM TARTRATE 10 MG PO TABS
10.0000 mg | ORAL_TABLET | Freq: Once | ORAL | Status: AC
  Administered 2012-01-18: 10 mg via ORAL
  Filled 2012-01-18: qty 1

## 2012-01-18 MED ORDER — LACTATED RINGERS IV SOLN: INTRAVENOUS | Status: DC

## 2012-01-18 MED ORDER — SUCCINYLCHOLINE CHLORIDE 20 MG/ML IJ SOLN
INTRAMUSCULAR | Status: DC | PRN
  Administered 2012-01-18: 100 mg via INTRAVENOUS

## 2012-01-18 MED ORDER — PHENYLEPHRINE HCL 10 MG/ML IJ SOLN
INTRAMUSCULAR | Status: DC | PRN
  Administered 2012-01-18: 20 ug via INTRAVENOUS
  Administered 2012-01-18: 40 ug via INTRAVENOUS
  Administered 2012-01-18: 20 ug via INTRAVENOUS

## 2012-01-18 MED ORDER — CISATRACURIUM BESYLATE (PF) 10 MG/5ML IV SOLN
INTRAVENOUS | Status: DC | PRN
  Administered 2012-01-18: 10 mg via INTRAVENOUS
  Administered 2012-01-18: 4 mg via INTRAVENOUS

## 2012-01-18 MED ORDER — SERTRALINE HCL 50 MG PO TABS
150.0000 mg | ORAL_TABLET | Freq: Every day | ORAL | Status: DC
  Administered 2012-01-18 – 2012-01-22 (×5): 150 mg via ORAL
  Filled 2012-01-18 (×6): qty 1

## 2012-01-18 MED ORDER — TAMSULOSIN HCL 0.4 MG PO CAPS
0.4000 mg | ORAL_CAPSULE | Freq: Every day | ORAL | Status: DC
  Filled 2012-01-18 (×6): qty 1

## 2012-01-18 MED ORDER — DEXAMETHASONE SODIUM PHOSPHATE 10 MG/ML IJ SOLN
INTRAMUSCULAR | Status: DC | PRN
  Administered 2012-01-18: 10 mg via INTRAVENOUS

## 2012-01-18 MED ORDER — LEVOTHYROXINE SODIUM 150 MCG PO TABS
150.0000 ug | ORAL_TABLET | Freq: Every day | ORAL | Status: DC
  Administered 2012-01-19 – 2012-01-23 (×5): 150 ug via ORAL
  Filled 2012-01-18 (×6): qty 1

## 2012-01-18 MED ORDER — DEXTROSE 5 % IV SOLN
1.0000 g | Freq: Four times a day (QID) | INTRAVENOUS | Status: AC
  Administered 2012-01-18 – 2012-01-19 (×3): 1 g via INTRAVENOUS
  Filled 2012-01-18 (×3): qty 1

## 2012-01-18 MED ORDER — PROMETHAZINE HCL 25 MG/ML IJ SOLN: 6.2500 mg | INTRAMUSCULAR | Status: DC | PRN

## 2012-01-18 MED ORDER — HEPARIN SODIUM (PORCINE) 5000 UNIT/ML IJ SOLN
5000.0000 [IU] | Freq: Once | INTRAMUSCULAR | Status: AC
  Administered 2012-01-18: 5000 [IU] via SUBCUTANEOUS
  Filled 2012-01-18: qty 1

## 2012-01-18 MED ORDER — ONDANSETRON HCL 4 MG/2ML IJ SOLN
INTRAMUSCULAR | Status: DC | PRN
  Administered 2012-01-18 (×2): 2 mg via INTRAVENOUS

## 2012-01-18 MED ORDER — PROPOFOL 10 MG/ML IV EMUL
INTRAVENOUS | Status: DC | PRN
  Administered 2012-01-18: 150 mg via INTRAVENOUS

## 2012-01-18 MED ORDER — MEPERIDINE HCL 50 MG/ML IJ SOLN: 6.2500 mg | INTRAMUSCULAR | Status: DC | PRN

## 2012-01-18 MED ORDER — SERTRALINE HCL 50 MG PO TABS: 150.0000 mg | ORAL_TABLET | Freq: Every day | ORAL | Status: DC

## 2012-01-18 MED ORDER — HYDROMORPHONE HCL PF 1 MG/ML IJ SOLN
INTRAMUSCULAR | Status: AC
  Filled 2012-01-18: qty 1

## 2012-01-18 MED ORDER — ACETAMINOPHEN 10 MG/ML IV SOLN
INTRAVENOUS | Status: DC | PRN
  Administered 2012-01-18: 1000 mg via INTRAVENOUS

## 2012-01-18 MED ORDER — FENTANYL CITRATE 0.05 MG/ML IJ SOLN
INTRAMUSCULAR | Status: DC | PRN
  Administered 2012-01-18 (×2): 50 ug via INTRAVENOUS
  Administered 2012-01-18 (×3): 100 ug via INTRAVENOUS
  Administered 2012-01-18 (×3): 50 ug via INTRAVENOUS

## 2012-01-18 MED ORDER — HYDROMORPHONE HCL PF 1 MG/ML IJ SOLN
0.5000 mg | INTRAMUSCULAR | Status: DC | PRN
  Administered 2012-01-18 (×2): 0.5 mg via INTRAVENOUS

## 2012-01-18 MED ORDER — DEXTROSE 5 % IV SOLN
2.0000 g | INTRAVENOUS | Status: AC
  Administered 2012-01-18: 2 g via INTRAVENOUS

## 2012-01-18 MED ORDER — KCL IN DEXTROSE-NACL 20-5-0.9 MEQ/L-%-% IV SOLN
INTRAVENOUS | Status: DC
  Administered 2012-01-18 – 2012-01-20 (×6): via INTRAVENOUS
  Filled 2012-01-18 (×9): qty 1000

## 2012-01-18 MED ORDER — MONTELUKAST SODIUM 10 MG PO TABS
10.0000 mg | ORAL_TABLET | Freq: Every day | ORAL | Status: DC
  Administered 2012-01-18 – 2012-01-22 (×5): 10 mg via ORAL
  Filled 2012-01-18 (×6): qty 1

## 2012-01-18 MED ORDER — BUPIVACAINE HCL 0.5 % IJ SOLN
INTRAMUSCULAR | Status: AC
  Filled 2012-01-18: qty 1

## 2012-01-18 MED ORDER — BUPIVACAINE HCL (PF) 0.5 % IJ SOLN
INTRAMUSCULAR | Status: DC | PRN
  Administered 2012-01-18: 15 mL

## 2012-01-18 MED ORDER — CEFOXITIN SODIUM-DEXTROSE 1-4 GM-% IV SOLR (PREMIX)
INTRAVENOUS | Status: AC
  Filled 2012-01-18: qty 100

## 2012-01-18 SURGICAL SUPPLY — 95 items
APPLICATOR COTTON TIP 6IN STRL (MISCELLANEOUS) ×6 IMPLANT
APPLIER CLIP 5 13 M/L LIGAMAX5 (MISCELLANEOUS)
APPLIER CLIP ROT 10 11.4 M/L (STAPLE)
APR CLP MED LRG 11.4X10 (STAPLE)
APR CLP MED LRG 5 ANG JAW (MISCELLANEOUS)
BLADE EXTENDED COATED 6.5IN (ELECTRODE) IMPLANT
BLADE HEX COATED 2.75 (ELECTRODE) ×3 IMPLANT
BLADE SURG SZ10 CARB STEEL (BLADE) ×3 IMPLANT
CABLE HIGH FREQUENCY MONO STRZ (ELECTRODE) ×3 IMPLANT
CANISTER SUCTION 2500CC (MISCELLANEOUS) ×3 IMPLANT
CANNULA ENDOPATH XCEL 11M (ENDOMECHANICALS) IMPLANT
CELLS DAT CNTRL 66122 CELL SVR (MISCELLANEOUS) IMPLANT
CLIP APPLIE 5 13 M/L LIGAMAX5 (MISCELLANEOUS) IMPLANT
CLIP APPLIE ROT 10 11.4 M/L (STAPLE) IMPLANT
CLIP TI LARGE 6 (CLIP) IMPLANT
CLOTH BEACON ORANGE TIMEOUT ST (SAFETY) ×3 IMPLANT
COVER MAYO STAND STRL (DRAPES) ×3 IMPLANT
DECANTER SPIKE VIAL GLASS SM (MISCELLANEOUS) ×3 IMPLANT
DISSECTOR BLUNT TIP ENDO 5MM (MISCELLANEOUS) ×2 IMPLANT
DRAIN CHANNEL 10F 3/8 F FF (DRAIN) IMPLANT
DRAPE INCISE IOBAN 66X45 STRL (DRAPES) IMPLANT
DRAPE LAPAROSCOPIC ABDOMINAL (DRAPES) ×3 IMPLANT
DRAPE LG THREE QUARTER DISP (DRAPES) IMPLANT
DRAPE UTILITY XL STRL (DRAPES) ×3 IMPLANT
DRAPE WARM FLUID 44X44 (DRAPE) ×3 IMPLANT
DRSG PAD ABDOMINAL 8X10 ST (GAUZE/BANDAGES/DRESSINGS) IMPLANT
DRSG TEGADERM 4X4.75 (GAUZE/BANDAGES/DRESSINGS) ×2 IMPLANT
ELECT REM PT RETURN 9FT ADLT (ELECTROSURGICAL) ×3
ELECTRODE REM PT RTRN 9FT ADLT (ELECTROSURGICAL) ×2 IMPLANT
EVACUATOR DRAINAGE 10X20 100CC (DRAIN) IMPLANT
EVACUATOR SILICONE 100CC (DRAIN) IMPLANT
FILTER SMOKE EVAC LAPAROSHD (FILTER) IMPLANT
GAUZE SPONGE 4X4 16PLY XRAY LF (GAUZE/BANDAGES/DRESSINGS) IMPLANT
GLOVE BIOGEL PI IND STRL 7.0 (GLOVE) ×2 IMPLANT
GLOVE BIOGEL PI INDICATOR 7.0 (GLOVE) ×1
GLOVE ECLIPSE 8.0 STRL XLNG CF (GLOVE) ×6 IMPLANT
GLOVE INDICATOR 8.0 STRL GRN (GLOVE) ×6 IMPLANT
GOWN STRL NON-REIN LRG LVL3 (GOWN DISPOSABLE) ×3 IMPLANT
GOWN STRL REIN XL XLG (GOWN DISPOSABLE) ×6 IMPLANT
HAND ACTIVATED (MISCELLANEOUS) IMPLANT
KIT BASIN OR (CUSTOM PROCEDURE TRAY) ×3 IMPLANT
LEGGING LITHOTOMY PAIR STRL (DRAPES) IMPLANT
LIGASURE IMPACT 36 18CM CVD LR (INSTRUMENTS) IMPLANT
NS IRRIG 1000ML POUR BTL (IV SOLUTION) ×6 IMPLANT
PACK GENERAL/GYN (CUSTOM PROCEDURE TRAY) ×3 IMPLANT
PAD TELFA 2X3 NADH STRL (GAUZE/BANDAGES/DRESSINGS) ×2 IMPLANT
PENCIL BUTTON HOLSTER BLD 10FT (ELECTRODE) ×3 IMPLANT
RELOAD PROXIMATE 75MM BLUE (ENDOMECHANICALS) IMPLANT
RELOAD STAPLE 75 3.8 BLU REG (ENDOMECHANICALS) IMPLANT
RETRACTOR WND ALEXIS 18 MED (MISCELLANEOUS) IMPLANT
RTRCTR WOUND ALEXIS 18CM MED (MISCELLANEOUS)
SCALPEL HARMONIC ACE (MISCELLANEOUS) ×2 IMPLANT
SCISSORS LAP 5X35 DISP (ENDOMECHANICALS) ×3 IMPLANT
SET IRRIG TUBING LAPAROSCOPIC (IRRIGATION / IRRIGATOR) ×2 IMPLANT
SLEEVE XCEL OPT CAN 5 100 (ENDOMECHANICALS) ×2 IMPLANT
SOLUTION ANTI FOG 6CC (MISCELLANEOUS) ×3 IMPLANT
SPONGE GAUZE 4X4 12PLY (GAUZE/BANDAGES/DRESSINGS) ×3 IMPLANT
SPONGE LAP 18X18 X RAY DECT (DISPOSABLE) ×6 IMPLANT
STAPLER GUN LINEAR PROX 60 (STAPLE) ×4 IMPLANT
STAPLER PROXIMATE 75MM BLUE (STAPLE) ×2 IMPLANT
STAPLER VISISTAT 35W (STAPLE) ×3 IMPLANT
SUCTION POOLE TIP (SUCTIONS) ×3 IMPLANT
SUT CHROMIC 0 SH (SUTURE) IMPLANT
SUT CHROMIC 2 0 TIES 18 (SUTURE) IMPLANT
SUT NOV 1 T60/GS (SUTURE) IMPLANT
SUT NOVA NAB DX-16 0-1 5-0 T12 (SUTURE) IMPLANT
SUT NOVA T20/GS 25 (SUTURE) IMPLANT
SUT PDS AB 1 CTX 36 (SUTURE) ×2 IMPLANT
SUT PDS AB 1 TP1 96 (SUTURE) IMPLANT
SUT PROLENE 2 0 SH DA (SUTURE) IMPLANT
SUT SILK 2 0 (SUTURE) ×3
SUT SILK 2 0 SH CR/8 (SUTURE) ×3 IMPLANT
SUT SILK 2 0SH CR/8 30 (SUTURE) IMPLANT
SUT SILK 2-0 18XBRD TIE 12 (SUTURE) ×2 IMPLANT
SUT SILK 2-0 30XBRD TIE 12 (SUTURE) IMPLANT
SUT SILK 3 0 (SUTURE) ×3
SUT SILK 3 0 SH CR/8 (SUTURE) ×3 IMPLANT
SUT SILK 3-0 18XBRD TIE 12 (SUTURE) ×2 IMPLANT
SUT VIC AB 2-0 SH 18 (SUTURE) IMPLANT
SUT VIC AB 3-0 SH 18 (SUTURE) IMPLANT
SUT VICRYL 2 0 18  UND BR (SUTURE) ×1
SUT VICRYL 2 0 18 UND BR (SUTURE) ×2 IMPLANT
SYS LAPSCP GELPORT 120MM (MISCELLANEOUS)
SYSTEM LAPSCP GELPORT 120MM (MISCELLANEOUS) IMPLANT
TAPE CLOTH SURG 4X10 WHT LF (GAUZE/BANDAGES/DRESSINGS) ×2 IMPLANT
TOWEL OR 17X26 10 PK STRL BLUE (TOWEL DISPOSABLE) ×6 IMPLANT
TRAY FOLEY CATH 14FRSI W/METER (CATHETERS) ×3 IMPLANT
TRAY LAP CHOLE (CUSTOM PROCEDURE TRAY) ×3 IMPLANT
TROCAR BLADELESS OPT 5 100 (ENDOMECHANICALS) IMPLANT
TROCAR BLADELESS OPT 5 75 (ENDOMECHANICALS) ×10 IMPLANT
TROCAR XCEL BLUNT TIP 100MML (ENDOMECHANICALS) IMPLANT
TROCAR XCEL NON-BLD 11X100MML (ENDOMECHANICALS) IMPLANT
TUBING INSUFFLATION 10FT LAP (TUBING) ×3 IMPLANT
YANKAUER SUCT BULB TIP 10FT TU (MISCELLANEOUS) ×3 IMPLANT
YANKAUER SUCT BULB TIP NO VENT (SUCTIONS) ×3 IMPLANT

## 2012-01-18 NOTE — Anesthesia Postprocedure Evaluation (Signed)
  Anesthesia Post-op Note  Patient: [name]  Procedure(s) Performed: Procedure(s) (LRB): LAPAROSCOPIC ILEO LOOP COLOSTOMY CLOSURE (N/A)  Patient Location: PACU  Anesthesia Type: General  Level of Consciousness: awake and alert   Airway and Oxygen Therapy: Patient Spontanous Breathing  Post-op Pain: mild  Post-op Assessment: Post-op Vital signs reviewed, Patient's Cardiovascular Status Stable, Respiratory Function Stable, Patent Airway and No signs of Nausea or vomiting  Post-op Vital Signs: stable  Complications: No apparent anesthesia complications

## 2012-01-18 NOTE — Preoperative (Signed)
Beta Blockers   Reason not to administer Beta Blockers:Not Applicable 

## 2012-01-18 NOTE — Op Note (Signed)
Operative Note  [name] female 61 y.o. 01/18/2012  PREOPERATIVE DX:  Ileostomy  POSTOPERATIVE DX:  Same  PROCEDURE:  1. Laparoscopic lyses of adhesions for 45 minutes. 2. Laparoscopic assisted segmental small bowel resection including ileostomy and ileostomy closure.         Surgeon: Adolph Pollack   Assistants: Harriette Bouillon M.D.  Anesthesia: General endotracheal anesthesia and Marcaine for local anesthesia  Indications:  This is a 61 year old female with a complex surgical history outlined in her admission history and physical.. She has an end ileostomy and now presents for closure of this.    Procedure Detail:  She was seen in the holding room. She is brought to the operating room placed supine on the operating table and a general anesthetic was administered. A Foley catheter and nasogastric tube were inserted. The abdominal wall and ileostomy site were sterilely prepped and draped.  A 5 mm incision was made in the right upper quadrant subcostal region. Using a 5 mm Optiview trocar and laparoscope I gained access to the peritoneal cavity and a pneumoperitoneum was created. Multiple adhesions were seen between the omentum and abdominal wall. I was able to visualize the left upper quadrant abdominal wall area. A 5 mm trocar was placed here. Using sharp dissection I began taking down some of the adhesions to the abdominal wall. The ileostomy site could be visualized as well as a peristomal hernia. I visualized the stapled off portion of ileum which had been marked with Prolene suture.  A second 5 mm trocar was placed in the left midabdomen. Lysis of adhesions was performed between the omentum and midline in the omentum and right abdominal wall area using sharp dissection, blunt dissection, and the harmonic scalpel. I completely freed up all the omental adhesions from the right side of the abdomen and midline regions. Subsequently I placed a 5 mm trocar in the right mid  lateral abdomen and one in the right lower quadrant. I then mobilized the stapled off portion of the ileum for dividing adhesions between it and the mesentery and it and the lateral abdominal wall on the left side. Adhesions between the omentum and lateral abdominal wall and the left side were also divided. Incisional hernias in the midline were noted.  Once I felt I had adequately mobilized the distal ileum stump I then approached the ileostomy site. An elliptical incision was made around the ileostomy and carried down to the subcutaneous tissues. Using sharp dissection and electrocautery I mobilized the ileostomy free from the fascia and muscle.  I then brought out the stapled off portion of the ileum through the ileostomy site defect and I had plenty of length.  I resected a small segment of small bowel to include the ileostomy. Then created a side-to-side staple anastomosis between the 2 segments of the ileum. There was no bleeding from the staple line. The common defect was closed with a linear noncutting stapling which actually completed the small bowel resection.  The anastomosis was patent, viable, under no tension, and without evidence of leak.  The mesenteric defect was closed with interrupted 3-0 silk sutures. The anastomosis was dropped back into the abdominal cavity.  A fascial defect was then closed with running #1 Novafil sutures.  I re\re insufflated the abdomen and did a four-quadrant and central inspection. There is no evidence of bleeding or injury. The fascial closure was solid. The trocars were removed and the pneumoperitoneum was released.  The trocar sites skin incisions were closed with 3-0 Monocryl  subcuticular stitches followed by Steri-Strips and sterile dressing. The ileostomy site skin was loosely closed with 3-0 nylon sutures then the gaps were packed with saline moistened gauze followed by bulky dry dressing.She tolerated the procedure well without any apparent complications  and was taken to the recovery in satisfactory condition.   Estimated Blood Loss:  less than 100 mL         Drains: none  Blood Given: none          Specimens: Segment of small bowel including ileostomy        Complications:  * No complications entered in OR log *         Disposition: PACU - hemodynamically stable.         Condition: stable

## 2012-01-18 NOTE — Anesthesia Preprocedure Evaluation (Addendum)
Anesthesia Evaluation  Patient identified by MRN, date of birth, ID band Patient awake    Reviewed: Allergy & Precautions, H&P , NPO status , Patient's Chart, lab work & pertinent test results  Airway Mallampati: II TM Distance: >3 FB Neck ROM: Full    Dental  (+) Teeth Intact and Dental Advisory Given   Pulmonary asthma , pneumonia -,  breath sounds clear to auscultation  Pulmonary exam normal       Cardiovascular hypertension, Pt. on medications Rhythm:Regular Rate:Normal     Neuro/Psych PSYCHIATRIC DISORDERS Depression negative neurological ROS     GI/Hepatic negative GI ROS, Neg liver ROS,   Endo/Other  Hypothyroidism   Renal/GU Renal InsufficiencyRenal disease  negative genitourinary   Musculoskeletal negative musculoskeletal ROS (+)   Abdominal   Peds negative pediatric ROS (+)  Hematology negative hematology ROS (+)   Anesthesia Other Findings   Reproductive/Obstetrics negative OB ROS                           Anesthesia Physical  Anesthesia Plan  ASA: II  Anesthesia Plan: General   Post-op Pain Management:    Induction: Intravenous  Airway Management Planned: Oral ETT  Additional Equipment:   Intra-op Plan:   Post-operative Plan: Extubation in OR  Informed Consent: I have reviewed the patients History and Physical, chart, labs and discussed the procedure including the risks, benefits and alternatives for the proposed anesthesia with the patient or authorized representative who has indicated his/her understanding and acceptance.   Dental advisory given  Plan Discussed with: CRNA  Anesthesia Plan Comments:         Anesthesia Quick Evaluation

## 2012-01-18 NOTE — Transfer of Care (Signed)
Immediate Anesthesia Transfer of Care Note  Patient: [name]  Procedure(s) Performed: Procedure(s) (LRB): LAPAROSCOPIC ILEO LOOP COLOSTOMY CLOSURE (N/A)  Patient Location: PACU  Anesthesia Type: General  Level of Consciousness: awake, oriented, patient cooperative, lethargic and responds to stimulation  Airway & Oxygen Therapy: Patient Spontanous Breathing and Patient connected to face mask oxygen  Post-op Assessment: Report given to PACU RN, Post -op Vital signs reviewed and stable and Patient moving all extremities  Post vital signs: Reviewed and stable  Complications: No apparent anesthesia complications

## 2012-01-18 NOTE — Progress Notes (Signed)
Dr. Acey Lav in- made aware of patient's vital signs , I& Os  In O.R.. Patient to receive a 500 cc LR IVB rapidly in PACU

## 2012-01-18 NOTE — H&P (Signed)
[name] is an 61 y.o. female.   Chief Complaint:   Here for ileostomy reversal HPI:  She is here for end ileostomy reversal. Her history is well documented in previous notes. She underwent abdominal colectomy with ileum to rectal anastomosis in October of 2012. This was complicated by anastomotic leak that was treated with diverting loop ileostomy and drainage. The loop ileostomy was closed and then she had a leak from the closure requiring exploratory laparotomy and end ileostomy December 2012. This was complicated by pneumonia and superior mesenteric vein thrombosis. She is completed her Coumadin treatment. She had a kidney stone treated by way of stent and lithotripsy. She is now ready for ileostomy reversal.   Past Medical History  Diagnosis Date  . HYPOTHYROIDISM 10/06/2008  . HYPERLIPIDEMIA 10/06/2008  . DEPRESSION 02/23/2010  . DIVERTICULITIS, ACUTE 11/10/2008  . ALLERGIC RHINITIS 07/23/2009  . Diverticulosis of colon 11/10/2008  . Asthma 11-17-11    no flareups in adult yrs. Albuterol used rarely  . 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 Coumadin-all resolved. no coumadin since 11/09/11  . Chronic kidney disease 11-17-11    hx. past multiple kidney stones, prev. lithos  . 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 Anemia   . Arthritis     osteoarthritis  . Pneumonia 11-17-11    1'13 postop pneumonia.   Marland Kitchen HYPERTENSION 10/06/2008    EKG 12/12 EPIC    Past Surgical History  Procedure Date  . Thyroid surgery 1982    tumor removed  . Cesarean section 1983  . Rotator cuff repair 11-17-11    left '05  . Kidney stone surgery 2000, 2009, 2010  . Laparoscopic assissted total colectomy w/ j-pouch 03/01/11  . Ileostomy closure 05/02/2011    ileosomy takedown  . Laparotomy 05/14/2011    small bowel resection, ileostomy  . Abdominal hysterectomy 1986    fibroids  . Eye surgery     lasik  bilaterally  . Appendectomy   . Colon surgery 12/12    ileostomy      Family History  Problem Relation Age of Onset  . Arthritis Other   . Mental illness Other   . COPD Mother   . Depression Mother   . Deep vein thrombosis Father   . Depression Father    Social History:  reports that she quit smoking about 31 years ago. Her smoking use included Cigarettes. She has a 10 pack-year smoking history. She has never used smokeless tobacco. She reports that she does not drink alcohol or use illicit drugs.  Allergies:  Allergies  Allergen Reactions  . Modafinil Rash    Under breasts only.    Medications Prior to Admission  Medication Sig Dispense Refill  . ferrous gluconate (FERGON) 225 (27 FE) MG tablet Take 240 mg by mouth daily.      . Flaxseed, Linseed, (FLAX SEED OIL) 1300 MG CAPS Take 1 capsule by mouth daily.      Marland Kitchen levothyroxine (SYNTHROID, LEVOTHROID) 150 MCG tablet Take 150 mcg by mouth daily before breakfast.      . losartan (COZAAR) 50 MG tablet Take 50 mg by mouth daily before breakfast.      . meloxicam (MOBIC) 15 MG tablet take 1 tablet by mouth once daily  30 tablet  11  . montelukast (SINGULAIR) 10 MG tablet Take 10 mg by mouth at bedtime.       . Multiple Vitamin (MULTIVITAMIN) tablet Take 1 tablet  by mouth daily.      . sertraline (ZOLOFT) 100 MG tablet Take 150 mg by mouth daily. Takes along with 50 mg tablet to equal 150 mg at night      . sertraline (ZOLOFT) 50 MG tablet Take 150 mg by mouth daily. Takes with 100 mg tablet to equal 150 mg at night      . Tamsulosin HCl (FLOMAX) 0.4 MG CAPS Take 0.4 mg by mouth daily.       . vitamin A 8000 UNIT capsule Take 8,000 Units by mouth daily.      Marland Kitchen zinc gluconate 50 MG tablet Take 50 mg by mouth daily.      Marland Kitchen zolpidem (AMBIEN) 10 MG tablet take 1 tablet by mouth at bedtime if needed  30 tablet  5  . oxyCODONE-acetaminophen (PERCOCET) 5-325 MG per tablet Take 1-2 tablets by mouth every 6 (six) hours as needed for pain.  30 tablet  0    Results for orders placed during the hospital  encounter of 01/18/12 (from the past 48 hour(s))  TYPE AND SCREEN     Status: Normal   Collection Time   01/18/12  5:45 AM      Component Value Range Comment   ABO/RH(D) A POS      Antibody Screen NEG      Sample Expiration 01/21/2012      No results found.  Review of Systems  Constitutional: Negative for fever and chills.  Gastrointestinal: Negative for nausea and vomiting.    Blood pressure 117/71, pulse 97, temperature 98 F (36.7 C), temperature source Oral, resp. rate 16, SpO2 99.00%. Physical Exam  Constitutional: She appears well-developed and well-nourished. No distress.  HENT:  Head: Normocephalic and atraumatic.  Neck: Neck supple.  Cardiovascular: Normal rate and regular rhythm.   Respiratory: Effort normal.  GI: Soft. She exhibits no mass. There is no tenderness.       Midline scar with palpable hernia, RLQ scar, LUQ ileostomy  Musculoskeletal: She exhibits no edema.  Lymphadenopathy:    She has no cervical adenopathy.     Assessment/Plan Ileostomy.  Plan:  Laparoscopic assisted possible open ileostomy closure.  Procedure, risks, and aftercare have been discussed with her and her husband preoperatively.  Jeyda Siebel J 01/18/2012, 7:27 AM

## 2012-01-18 NOTE — Anesthesia Procedure Notes (Signed)
Procedure Name: Intubation Date/Time: 01/18/2012 7:55 AM Performed by: Edison Pace Pre-anesthesia Checklist: Patient identified, Timeout performed, Emergency Drugs available, Suction available and Patient being monitored Patient Re-evaluated:Patient Re-evaluated prior to inductionOxygen Delivery Method: Circle system utilized Preoxygenation: Pre-oxygenation with 100% oxygen Intubation Type: IV induction and Cricoid Pressure applied Ventilation: Mask ventilation without difficulty Laryngoscope Size: Mac and 4 Grade View: Grade II Tube type: Oral Tube size: 7.0 mm Number of attempts: 1 Airway Equipment and Method: Stylet Placement Confirmation: ETT inserted through vocal cords under direct vision,  positive ETCO2 and breath sounds checked- equal and bilateral Secured at: 21 cm Tube secured with: Tape Dental Injury: Teeth and Oropharynx as per pre-operative assessment  Future Recommendations: Recommend- induction with short-acting agent, and alternative techniques readily available

## 2012-01-18 NOTE — Addendum Note (Signed)
Addendum  created 01/18/12 1217 by Phillips Grout, MD   Modules edited:Orders

## 2012-01-18 NOTE — Interval H&P Note (Signed)
History and Physical Interval Note:  01/18/2012 7:29 AM  [name]  has presented today for surgery, with the diagnosis of ileostomy  The various methods of treatment have been discussed with the patient and family. After consideration of risks, benefits and other options for treatment, the patient has consented to  Procedure(s) (LRB): LAPAROSCOPIC ILEO LOOP COLOSTOMY CLOSURE (N/A) ILEOSTOMY TAKEDOWN (N/A) as a surgical intervention .  The patient's history has been reviewed, patient examined, no change in status, stable for surgery.  I have reviewed the patient's chart and labs.  Questions were answered to the patient's satisfaction.     Rukia Mcgillivray Shela Commons

## 2012-01-19 ENCOUNTER — Encounter (HOSPITAL_COMMUNITY): Payer: Self-pay | Admitting: General Surgery

## 2012-01-19 LAB — BASIC METABOLIC PANEL
CO2: 22 mEq/L (ref 19–32)
Calcium: 9.2 mg/dL (ref 8.4–10.5)
Creatinine, Ser: 0.89 mg/dL (ref 0.50–1.10)
Glucose, Bld: 130 mg/dL — ABNORMAL HIGH (ref 70–99)
Sodium: 139 mEq/L (ref 135–145)

## 2012-01-19 LAB — CBC
HCT: 31.9 % — ABNORMAL LOW (ref 36.0–46.0)
MCH: 30.4 pg (ref 26.0–34.0)
MCV: 95.2 fL (ref 78.0–100.0)
Platelets: 187 10*3/uL (ref 150–400)
RBC: 3.35 MIL/uL — ABNORMAL LOW (ref 3.87–5.11)

## 2012-01-19 MED ORDER — ZOLPIDEM TARTRATE 10 MG PO TABS
10.0000 mg | ORAL_TABLET | Freq: Every evening | ORAL | Status: DC | PRN
  Administered 2012-01-19 – 2012-01-22 (×4): 10 mg via ORAL
  Filled 2012-01-19 (×4): qty 1

## 2012-01-19 NOTE — Progress Notes (Signed)
PT REQUESTED AN AMBIEN FOR SLEEP LAST NIGHT.  I OBTAINED A ONE TIME ORDER.  SHE NEEDS TO HAVE IT SCHEDULED HERE.

## 2012-01-19 NOTE — Progress Notes (Signed)
1 Day Post-Op  Subjective: Not having a lot of pain.  No nausea.  Objective: Vital signs in last 24 hours: Temp:  [98.2 F (36.8 C)-99.7 F (37.6 C)] 98.2 F (36.8 C) (08/23 0600) Pulse Rate:  [91-108] 95  (08/23 0600) Resp:  [7-18] 16  (08/23 0600) BP: (97-122)/(52-77) 119/77 mmHg (08/23 0600) SpO2:  [96 %-100 %] 98 % (08/23 0600) Weight:  [188 lb (85.276 kg)] 188 lb (85.276 kg) (08/22 1427) Last BM Date:  (PTA)  Intake/Output from previous day: 08/22 0701 - 08/23 0700 In: 6614.6 [I.V.:6464.6; IV Piggyback:150] Out: 2550 [Urine:2550] Intake/Output this shift:    PE: Abd-soft, partially open wound at ileostomy site is clean, active bowel sounds, trocar site dressings are dry.  Lab Results:   [facility] 01/19/12 0354  WBC 6.7  HGB 10.2*  HCT 31.9*  PLT 187   BMET  Basename 01/19/12 0354  NA 139  K 3.9  CL 106  CO2 22  GLUCOSE 130*  BUN 14  CREATININE 0.89  CALCIUM 9.2   PT/INR No results found for this basename: LABPROT:2,INR:2 in the last 72 hours Comprehensive Metabolic Panel:    Component Value Date/Time   NA 139 01/19/2012 0354   K 3.9 01/19/2012 0354   CL 106 01/19/2012 0354   CO2 22 01/19/2012 0354   BUN 14 01/19/2012 0354   CREATININE 0.89 01/19/2012 0354   GLUCOSE 130* 01/19/2012 0354   CALCIUM 9.2 01/19/2012 0354   CALCIUM 10.2 04/02/2011 0440   AST 30 01/12/2012 1130   ALT 31 01/12/2012 1130   ALKPHOS 66 01/12/2012 1130   BILITOT 0.2* 01/12/2012 1130   PROT 7.5 01/12/2012 1130   ALBUMIN 4.1 01/12/2012 1130     Studies/Results: No results found.  Anti-infectives: Anti-infectives     Start     Dose/Rate Route Frequency Ordered Stop   01/18/12 1400   cefOXitin (MEFOXIN) 1 g in dextrose 5 % 50 mL IVPB        1 g 100 mL/hr over 30 Minutes Intravenous Every 6 hours 01/18/12 1317 01/19/12 0235   01/18/12 0527   cefOXitin (MEFOXIN) 2 g in dextrose 5 % 50 mL IVPB        2 g 100 mL/hr over 30 Minutes Intravenous 60 min pre-op 01/18/12 0527 01/18/12  0730          Assessment Active Problems:  Ileostomy status s/p closure on 01/18/12-stable overnight; foley removed.    LOS: 1 day   Plan: Restart Ambien.  Decrease IVF.  Ambulate.  Clear liquid diet.   [name] Shela Commons 01/19/2012

## 2012-01-19 NOTE — Care Management Note (Signed)
    Page 1 of 1   01/23/2012     3:28:00 PM   CARE MANAGEMENT NOTE 01/23/2012  Patient:  [name], [name]   Account Number:  [id number]  Date Initiated:  01/19/2012  Documentation initiated by:  Lorenda Ishihara  Subjective/Objective Assessment:   61 yo female admitted s/p Laparoscopic lyses of adhesions, small bowel resection including ileostomy and ileostomy closure.  PTA lived at home with spouse.     Action/Plan:   Anticipated DC Date:  01/25/2012   Anticipated DC Plan:  HOME/SELF CARE      DC Planning Services  CM consult      Choice offered to / List presented to:             Status of service:  Completed, signed off Medicare Important Message given?   (If response is "NO", the following Medicare IM given date fields will be blank) Date Medicare IM given:   Date Additional Medicare IM given:    Discharge Disposition:  HOME/SELF CARE  Per UR Regulation:  Reviewed for med. necessity/level of care/duration of stay  If discussed at Long Length of Stay Meetings, dates discussed:    Comments:

## 2012-01-20 MED ORDER — HYDROCODONE-ACETAMINOPHEN 5-325 MG PO TABS
1.0000 | ORAL_TABLET | ORAL | Status: DC | PRN
  Administered 2012-01-20 – 2012-01-23 (×7): 1 via ORAL
  Filled 2012-01-20 (×7): qty 1

## 2012-01-20 MED ORDER — MORPHINE SULFATE 4 MG/ML IJ SOLN
INTRAMUSCULAR | Status: AC
  Administered 2012-01-20: 4 mg
  Filled 2012-01-20: qty 1

## 2012-01-20 NOTE — Progress Notes (Signed)
2 Days Post-Op  Subjective: Feels better today than yesterday.  Tolerating clear liquids. No nausea  Objective: Vital signs in last 24 hours: Temp:  [98.4 F (36.9 C)-99.5 F (37.5 C)] 98.4 F (36.9 C) (08/24 0442) Pulse Rate:  [99-103] 99  (08/24 0442) Resp:  [18-22] 20  (08/24 0442) BP: (107-113)/(60-74) 113/74 mmHg (08/24 0442) SpO2:  [100 %] 100 % (08/24 0442) Last BM Date:  (PTA)  Intake/Output from previous day: 08/23 0701 - 08/24 0700 In: 2673.8 [P.O.:240; I.V.:2433.8] Out: 1802 [Urine:1800; Stool:2] Intake/Output this shift: Total I/O In: -  Out: 201 [Urine:200; Stool:1]  General appearance: alert, cooperative and no distress Resp: nonlabored Cardio: normal rate, regular GI: soft, appropriate incisional tenderness, ND, reducible ventral hernia, wound okay with wicks in place, no peritoneal signs  Lab Results:   Basename 01/19/12 0354  WBC 6.7  HGB 10.2*  HCT 31.9*  PLT 187   BMET  Basename 01/19/12 0354  NA 139  K 3.9  CL 106  CO2 22  GLUCOSE 130*  BUN 14  CREATININE 0.89  CALCIUM 9.2   PT/INR No results found for this basename: LABPROT:2,INR:2 in the last 72 hours ABG No results found for this basename: PHART:2,PCO2:2,PO2:2,HCO3:2 in the last 72 hours  Studies/Results: No results found.  Anti-infectives: Anti-infectives     Start     Dose/Rate Route Frequency Ordered Stop   01/18/12 1400   cefOXitin (MEFOXIN) 1 g in dextrose 5 % 50 mL IVPB        1 g 100 mL/hr over 30 Minutes Intravenous Every 6 hours 01/18/12 1317 01/19/12 0235   01/18/12 0527   cefOXitin (MEFOXIN) 2 g in dextrose 5 % 50 mL IVPB        2 g 100 mL/hr over 30 Minutes Intravenous 60 min pre-op 01/18/12 0527 01/18/12 0730          Assessment/Plan: s/p Procedure(s) (LRB): LAPAROSCOPIC ILEO LOOP COLOSTOMY CLOSURE (N/A) doing okay on clears.  will be slow to advance diet given history though she seems to be progressing well.  no evidence of postop complication  LOS: 2  days    [name] [name] 01/20/2012

## 2012-01-21 MED ORDER — DEXTROSE 5 % IV SOLN
1.0000 g | Freq: Three times a day (TID) | INTRAVENOUS | Status: DC
  Administered 2012-01-21 – 2012-01-23 (×7): 1 g via INTRAVENOUS
  Filled 2012-01-21 (×7): qty 1

## 2012-01-21 NOTE — Progress Notes (Signed)
3 Days Post-Op  Subjective: No complaints.  Several bowel movements  Objective: Vital signs in last 24 hours: Temp:  [98.9 F (37.2 C)-100 F (37.8 C)] 100 F (37.8 C) (08/25 0517) Pulse Rate:  [94-98] 97  (08/25 0517) Resp:  [18] 18  (08/25 0517) BP: (110-119)/(71-76) 119/75 mmHg (08/25 0517) SpO2:  [95 %-99 %] 95 % (08/25 0517) Last BM Date: 01/20/12  Intake/Output from previous day: 08/24 0701 - 08/25 0700 In: 1080 [P.O.:1080] Out: 1651 [Urine:1650; Stool:1] Intake/Output this shift:    General appearance: alert, cooperative and no distress Resp: nonlabored Cardio: normal rate, regular GI: soft, minimal incisional tenderness, ND, no peritoneal signs, she does have some erythema around her LLQ wound but wound with wicks  Lab Results:   Basename 01/19/12 0354  WBC 6.7  HGB 10.2*  HCT 31.9*  PLT 187   BMET  Basename 01/19/12 0354  NA 139  K 3.9  CL 106  CO2 22  GLUCOSE 130*  BUN 14  CREATININE 0.89  CALCIUM 9.2   PT/INR No results found for this basename: LABPROT:2,INR:2 in the last 72 hours ABG No results found for this basename: PHART:2,PCO2:2,PO2:2,HCO3:2 in the last 72 hours  Studies/Results: No results found.  Anti-infectives: Anti-infectives     Start     Dose/Rate Route Frequency Ordered Stop   01/18/12 1400   cefOXitin (MEFOXIN) 1 g in dextrose 5 % 50 mL IVPB        1 g 100 mL/hr over 30 Minutes Intravenous Every 6 hours 01/18/12 1317 01/19/12 0235   01/18/12 0527   cefOXitin (MEFOXIN) 2 g in dextrose 5 % 50 mL IVPB        2 g 100 mL/hr over 30 Minutes Intravenous 60 min pre-op 01/18/12 0527 01/18/12 0730          Assessment/Plan: s/p Procedure(s) (LRB): LAPAROSCOPIC ILEO LOOP COLOSTOMY CLOSURE (N/A) Advance diet will restart antibiotics for some erythema around the wound.  otherwise she still seems to be doing great.  LOS: 3 days    [name] [name] 01/21/2012

## 2012-01-22 NOTE — Progress Notes (Signed)
4 Days Post-Op  Subjective: Some stinging in LLQ wound.  Tolerating full liquids.  Bowels moving.  Objective: Vital signs in last 24 hours: Temp:  [98.8 F (37.1 C)-99.7 F (37.6 C)] 99.7 F (37.6 C) (08/26 0531) Pulse Rate:  [89-92] 91  (08/26 0531) Resp:  [18] 18  (08/26 0531) BP: (109-113)/(69-74) 109/74 mmHg (08/26 0531) SpO2:  [95 %-98 %] 95 % (08/26 0531) Last BM Date: 01/21/12  Intake/Output from previous day: 08/25 0701 - 08/26 0700 In: -  Out: 2450 [Urine:2450] Intake/Output this shift:    PE: Abd-soft, LLQ wound with some pinkness around it, smaller incisions clean and intact  Lab Results:  No results found for this basename: WBC:2,HGB:2,HCT:2,PLT:2 in the last 72 hours BMET No results found for this basename: NA:2,K:2,CL:2,CO2:2,GLUCOSE:2,BUN:2,CREATININE:2,CALCIUM:2 in the last 72 hours PT/INR No results found for this basename: LABPROT:2,INR:2 in the last 72 hours Comprehensive Metabolic Panel:    Component Value Date/Time   NA 139 01/19/2012 0354   K 3.9 01/19/2012 0354   CL 106 01/19/2012 0354   CO2 22 01/19/2012 0354   BUN 14 01/19/2012 0354   CREATININE 0.89 01/19/2012 0354   GLUCOSE 130* 01/19/2012 0354   CALCIUM 9.2 01/19/2012 0354   CALCIUM 10.2 04/02/2011 0440   AST 30 01/12/2012 1130   ALT 31 01/12/2012 1130   ALKPHOS 66 01/12/2012 1130   BILITOT 0.2* 01/12/2012 1130   PROT 7.5 01/12/2012 1130   ALBUMIN 4.1 01/12/2012 1130     Studies/Results: No results found.  Anti-infectives: Anti-infectives     Start     Dose/Rate Route Frequency Ordered Stop   01/21/12 0900   cefOXitin (MEFOXIN) 1 g in dextrose 5 % 50 mL IVPB        1 g 100 mL/hr over 30 Minutes Intravenous Every 8 hours 01/21/12 0832     01/18/12 1400   cefOXitin (MEFOXIN) 1 g in dextrose 5 % 50 mL IVPB        1 g 100 mL/hr over 30 Minutes Intravenous Every 6 hours 01/18/12 1317 01/19/12 0235   01/18/12 0527   cefOXitin (MEFOXIN) 2 g in dextrose 5 % 50 mL IVPB        2 g 100 mL/hr  over 30 Minutes Intravenous 60 min pre-op 01/18/12 0527 01/18/12 0730          Assessment Active Problems:  Ileostomy status-progressing well; still with some redness around the wound    LOS: 4 days   Plan: Remove stitches and open up rest of wound.  BID dressing changes.  Regular diet.   [name] Shela Commons 01/22/2012

## 2012-01-23 MED ORDER — HYDROCODONE-ACETAMINOPHEN 5-325 MG PO TABS: 1.0000 | ORAL_TABLET | ORAL | Status: DC | PRN

## 2012-01-23 NOTE — Progress Notes (Signed)
5 Days Post-Op  Subjective: Feeling good.  Tolerating her diet.  Objective: Vital signs in last 24 hours: Temp:  [98.7 F (37.1 C)-99.3 F (37.4 C)] 99.3 F (37.4 C) (08/27 0615) Pulse Rate:  [84-92] 84  (08/27 0615) Resp:  [18] 18  (08/27 0615) BP: (107-115)/(59-71) 112/67 mmHg (08/27 0615) SpO2:  [99 %] 99 % (08/27 0615) Last BM Date: 01/21/12  Intake/Output from previous day: 08/26 0701 - 08/27 0700 In: 780 [P.O.:480; IV Piggyback:300] Out: 2100 [Urine:2000; Stool:100] Intake/Output this shift:    PE: Abd-soft, LLQ wound with less pinkness around it, smaller incisions clean and intact  Lab Results:  No results found for this basename: WBC:2,HGB:2,HCT:2,PLT:2 in the last 72 hours BMET No results found for this basename: NA:2,K:2,CL:2,CO2:2,GLUCOSE:2,BUN:2,CREATININE:2,CALCIUM:2 in the last 72 hours PT/INR No results found for this basename: LABPROT:2,INR:2 in the last 72 hours Comprehensive Metabolic Panel:    Component Value Date/Time   NA 139 01/19/2012 0354   K 3.9 01/19/2012 0354   CL 106 01/19/2012 0354   CO2 22 01/19/2012 0354   BUN 14 01/19/2012 0354   CREATININE 0.89 01/19/2012 0354   GLUCOSE 130* 01/19/2012 0354   CALCIUM 9.2 01/19/2012 0354   CALCIUM 10.2 04/02/2011 0440   AST 30 01/12/2012 1130   ALT 31 01/12/2012 1130   ALKPHOS 66 01/12/2012 1130   BILITOT 0.2* 01/12/2012 1130   PROT 7.5 01/12/2012 1130   ALBUMIN 4.1 01/12/2012 1130     Studies/Results: No results found.  Anti-infectives: Anti-infectives     Start     Dose/Rate Route Frequency Ordered Stop   01/21/12 0900   cefOXitin (MEFOXIN) 1 g in dextrose 5 % 50 mL IVPB        1 g 100 mL/hr over 30 Minutes Intravenous Every 8 hours 01/21/12 0832     01/18/12 1400   cefOXitin (MEFOXIN) 1 g in dextrose 5 % 50 mL IVPB        1 g 100 mL/hr over 30 Minutes Intravenous Every 6 hours 01/18/12 1317 01/19/12 0235   01/18/12 0527   cefOXitin (MEFOXIN) 2 g in dextrose 5 % 50 mL IVPB        2 g 100 mL/hr  over 30 Minutes Intravenous 60 min pre-op 01/18/12 0527 01/18/12 0730          Assessment Active Problems:  Ileostomy status-progressing well; superficial wound infection is improved since wound completely opened up.    LOS: 5 days   Plan:  Discharge.  BID dressing changes.  Instructions given.  Duayne Brideau Shela Commons 01/23/2012

## 2012-01-23 NOTE — Discharge Summary (Signed)
Physician Discharge Summary  Patient ID: [name] MRN: [id number] DOB/AGE: 06-21-1950 61 y.o.  Admit date: 01/18/2012 Discharge date: 01/23/2012  Admission Diagnoses:  Ileostomy status  Discharge Diagnoses:  Active Problems:  Ileostomy status Superficial wound infection  Discharged Condition: good  Hospital Course: She underwent laparoscopic assisted ileostomy closure on 01/18/12.  The ileostomy site wound was loosely closed and gaps in the wound were packed with saline moistened gauze.  Her diet was slowly advanced and her bowels started moving on POD#2.  She developed a low grade fever and redness around the ileostomy site wound.  She was started on IV abxs and then the wound was opened up.  This led to improvement in the redness and a decrease in the fever.  By the day of her discharge she was tolerating a regular diet, her bowels were moving and she and her husband were comfortable with saline damp to dry dressing changes BID to the open wound.  Other discharge instructions were given.  I do not feel she needs any more antibiotics.  Consults: None  Significant Diagnostic Studies: none  Treatments: surgery: Laparoscopic assisted ileostomy closure  Discharge Exam: Blood pressure 112/67, pulse 84, temperature 99.3 F (37.4 C), temperature source Oral, resp. rate 18, height 5\' 8"  (1.727 m), weight 188 lb (85.276 kg), SpO2 99.00%.   Disposition: 01-Home or Self Care  Discharge Orders    Future Appointments: Provider: Department: Dept Phone: Center:   02/01/2012 9:00 AM Adolph Pollack, MD [facility] [phone] None   02/09/2012 10:30 AM Kristian Covey, MD [facility] [phone] [facility]     Medication List  As of 01/23/2012  9:36 AM   STOP taking these medications         oxyCODONE-acetaminophen 5-325 MG per tablet         TAKE these medications         ferrous gluconate 225 (27 FE) MG tablet   Commonly known as: FERGON   Take 240 mg by mouth  daily.      Flax Seed Oil 1300 MG Caps   Take 1 capsule by mouth daily.      HYDROcodone-acetaminophen 5-325 MG per tablet   Commonly known as: NORCO/VICODIN   Take 1-2 tablets by mouth every 4 (four) hours as needed (pain).      levothyroxine 150 MCG tablet   Commonly known as: SYNTHROID, LEVOTHROID   Take 150 mcg by mouth daily before breakfast.      losartan 50 MG tablet   Commonly known as: COZAAR   Take 50 mg by mouth daily before breakfast.      meloxicam 15 MG tablet   Commonly known as: MOBIC   take 1 tablet by mouth once daily      multivitamin tablet   Take 1 tablet by mouth daily.      sertraline 100 MG tablet   Commonly known as: ZOLOFT   Take 150 mg by mouth daily. Takes along with 50 mg tablet to equal 150 mg at night      sertraline 50 MG tablet   Commonly known as: ZOLOFT   Take 150 mg by mouth daily. Takes with 100 mg tablet to equal 150 mg at night      SINGULAIR 10 MG tablet   Generic drug: montelukast   Take 10 mg by mouth at bedtime.      Tamsulosin HCl 0.4 MG Caps   Commonly known as: FLOMAX   Take 0.4 mg by mouth daily.  vitamin A 8000 UNIT capsule   Take 8,000 Units by mouth daily.      zinc gluconate 50 MG tablet   Take 50 mg by mouth daily.      zolpidem 10 MG tablet   Commonly known as: AMBIEN   take 1 tablet by mouth at bedtime if needed             Signed: Beatrice Ziehm J 01/23/2012, 9:36 AM

## 2012-01-30 ENCOUNTER — Other Ambulatory Visit (INDEPENDENT_AMBULATORY_CARE_PROVIDER_SITE_OTHER): Payer: Self-pay | Admitting: General Surgery

## 2012-01-30 DIAGNOSIS — G8918 Other acute postprocedural pain: Secondary | ICD-10-CM

## 2012-02-01 ENCOUNTER — Ambulatory Visit (INDEPENDENT_AMBULATORY_CARE_PROVIDER_SITE_OTHER): Payer: Managed Care, Other (non HMO) | Admitting: General Surgery

## 2012-02-01 VITALS — BP 128/82 | HR 68 | Temp 98.4°F | Resp 18 | Ht 68.0 in | Wt 186.0 lb

## 2012-02-01 DIAGNOSIS — Z9889 Other specified postprocedural states: Secondary | ICD-10-CM

## 2012-02-01 NOTE — Patient Instructions (Signed)
Continue current dressing change regimen.  You may drive.

## 2012-02-01 NOTE — Progress Notes (Signed)
Procedure:  Ileostomy closure  Date:  01/18/12  Pathology:  Benign  History:  She is here for her first postoperative visit. She is tolerating her diet. She is having an average of 7 bowel movements a day. Some are difficult to control. They are doing wet to dry dressing changes to the open wound twice a day.  Exam: Abdomen-soft, open wound is clean with good granulation tissue present.  Assessment:  Doing well postoperatively. I would expect her to have somewhere between 4-7 bowel movements a day.  Plan:  May start using Imodium when necessary. May drive. Continue current dressing change regimen. Return visit in 4 weeks.

## 2012-02-09 ENCOUNTER — Ambulatory Visit (INDEPENDENT_AMBULATORY_CARE_PROVIDER_SITE_OTHER): Payer: Managed Care, Other (non HMO) | Admitting: Family Medicine

## 2012-02-09 ENCOUNTER — Encounter: Payer: Self-pay | Admitting: Family Medicine

## 2012-02-09 VITALS — BP 188/72 | Temp 98.3°F | Wt 184.0 lb

## 2012-02-09 DIAGNOSIS — D649 Anemia, unspecified: Secondary | ICD-10-CM

## 2012-02-09 DIAGNOSIS — Z23 Encounter for immunization: Secondary | ICD-10-CM

## 2012-02-09 DIAGNOSIS — E559 Vitamin D deficiency, unspecified: Secondary | ICD-10-CM

## 2012-02-09 DIAGNOSIS — E039 Hypothyroidism, unspecified: Secondary | ICD-10-CM

## 2012-02-09 LAB — CBC WITH DIFFERENTIAL/PLATELET
Basophils Absolute: 0 10*3/uL (ref 0.0–0.1)
Eosinophils Absolute: 0.3 10*3/uL (ref 0.0–0.7)
HCT: 30 % — ABNORMAL LOW (ref 36.0–46.0)
Lymphs Abs: 1.7 10*3/uL (ref 0.7–4.0)
MCHC: 32.4 g/dL (ref 30.0–36.0)
MCV: 93.9 fl (ref 78.0–100.0)
Monocytes Absolute: 0.5 10*3/uL (ref 0.1–1.0)
Monocytes Relative: 5.9 % (ref 3.0–12.0)
Platelets: 212 10*3/uL (ref 150.0–400.0)
RDW: 14.5 % (ref 11.5–14.6)

## 2012-02-09 LAB — TSH: TSH: 41.12 u[IU]/mL — ABNORMAL HIGH (ref 0.35–5.50)

## 2012-02-09 NOTE — Progress Notes (Signed)
Subjective:    Patient ID: [name], female    DOB: Oct 17, 1950, 61 y.o.   MRN: [id number]  HPI  Patient seen for followup. She had recent surgery in August for ileostomy takedown. Had previous total colectomy for recurrent diverticulitis. Surgery was uneventful. Postoperative hemoglobin 10.2. She has hypothyroidism slightly over replaced most recent thyroid with TSH 0.22. Currently takes Synthroid 137 mcg daily. History of vitamin D deficiency. At time we checked she had had poor nutritional status and had multiple consultations with surgery. We treated her with vitamin D 50,000 units weekly for 3 months. Good oral intake at this time. Patient requesting flu vaccine, also Pneumovax. She had hospital-acquired pneumonia last year. No history of asthma. Nonsmoker.  Past Medical History  Diagnosis Date  . HYPOTHYROIDISM 10/06/2008  . HYPERLIPIDEMIA 10/06/2008  . DEPRESSION 02/23/2010  . DIVERTICULITIS, ACUTE 11/10/2008  . ALLERGIC RHINITIS 07/23/2009  . Diverticulosis of colon 11/10/2008  . Asthma 11-17-11    no flareups in adult yrs. Albuterol used rarely  . 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 Coumadin-all resolved. no coumadin since 11/09/11  . Chronic kidney disease 11-17-11    hx. past multiple kidney stones, prev. lithos  . 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 Anemia   . Arthritis     osteoarthritis  . Pneumonia 11-17-11    1'13 postop pneumonia.   Marland Kitchen HYPERTENSION 10/06/2008    EKG 12/12 EPIC   Past Surgical History  Procedure Date  . Thyroid surgery 1982    tumor removed  . Cesarean section 1983  . Rotator cuff repair 11-17-11    left '05  . Kidney stone surgery 2000, 2009, 2010  . Laparoscopic assissted total colectomy w/ j-pouch 03/01/11  . Ileostomy closure 05/02/2011    ileosomy takedown  . Laparotomy 05/14/2011    small bowel resection, ileostomy  . Abdominal hysterectomy 1986    fibroids  . Eye surgery     lasik  bilaterally  .  Appendectomy   . Colon surgery 12/12    ileostomy   . Ileo loop colostomy closure 01/18/2012    Procedure: LAPAROSCOPIC ILEO LOOP COLOSTOMY CLOSURE;  Surgeon: Adolph Pollack, MD;  Location: [facility];  Service: General;  Laterality: N/A;  Laparoscopic Ileostomy Closure , lysis of adhesions     reports that she quit smoking about 31 years ago. Her smoking use included Cigarettes. She has a 10 pack-year smoking history. She has never used smokeless tobacco. She reports that she does not drink alcohol or use illicit drugs. family history includes Arthritis in her other; COPD in her mother; Deep vein thrombosis in her father; Depression in her father and mother; and Mental illness in her other. Allergies  Allergen Reactions  . Modafinil Rash    Under breasts only.      Review of Systems  Constitutional: Negative for fatigue.  Eyes: Negative for visual disturbance.  Respiratory: Negative for cough, chest tightness, shortness of breath and wheezing.   Cardiovascular: Negative for chest pain, palpitations and leg swelling.  Neurological: Negative for dizziness, seizures, syncope, weakness, light-headedness and headaches.       Objective:   Physical Exam  Constitutional: She appears well-developed and well-nourished.  Cardiovascular: Normal rate and regular rhythm.   Pulmonary/Chest: Effort normal and breath sounds normal. No respiratory distress. She has no wheezes. She has no rales.  Abdominal: Soft. There is no tenderness.  Musculoskeletal: She exhibits no edema.  Assessment & Plan:  #1 hypothyroidism. Slightly over replaced on most recent labs. Recheck TSH today and adjust accordingly #2 history of anemia following recent surgery. Check CBC  #3 health maintenance. Flu vaccine given. Patient requesting Pneumovax.   she'll need booster at age 40 #4 Vit D deficiency  Repeat level.

## 2012-02-12 ENCOUNTER — Other Ambulatory Visit: Payer: Self-pay | Admitting: *Deleted

## 2012-02-12 DIAGNOSIS — E039 Hypothyroidism, unspecified: Secondary | ICD-10-CM

## 2012-02-12 DIAGNOSIS — E559 Vitamin D deficiency, unspecified: Secondary | ICD-10-CM

## 2012-02-12 MED ORDER — LEVOTHYROXINE SODIUM 175 MCG PO TABS: 175.0000 ug | ORAL_TABLET | Freq: Every day | ORAL | Status: DC

## 2012-02-12 MED ORDER — VITAMIN D (ERGOCALCIFEROL) 1.25 MG (50000 UNIT) PO CAPS: 50000.0000 [IU] | ORAL_CAPSULE | ORAL | Status: DC

## 2012-02-12 NOTE — Progress Notes (Signed)
Quick Note:  Pt informed on cell VM, labs mailed to home with instructions highlighted, future labs ordered ______

## 2012-02-12 NOTE — Progress Notes (Signed)
Quick Note:  Labs mailed to pt home, Rx and future labs ordered ______

## 2012-03-06 ENCOUNTER — Ambulatory Visit (INDEPENDENT_AMBULATORY_CARE_PROVIDER_SITE_OTHER): Payer: Managed Care, Other (non HMO) | Admitting: General Surgery

## 2012-03-06 VITALS — BP 124/78 | HR 84 | Temp 97.7°F | Resp 18 | Ht 68.0 in | Wt 187.4 lb

## 2012-03-06 DIAGNOSIS — Z9889 Other specified postprocedural states: Secondary | ICD-10-CM

## 2012-03-06 NOTE — Patient Instructions (Signed)
High fiber diet.  Activities as tolerated except no lifting over 20 pounds.

## 2012-03-06 NOTE — Progress Notes (Signed)
Procedure:  Ileostomy closure  Date:  01/18/12  Pathology:  Benign  History:  She is here for her second postoperative visit. She is tolerating her diet.  She is still have loose stools, some of which are difficult to control.  She takes Imodium and this helps "90%" of the time. Exam: Abdomen-soft, LUQ wound has healed, large ventral hernia is present.  She is not interested in having this repaired at this time.  Assessment: Continues to slowly improve.  Plan:  Start a high fiber diet.  Activities as tolerated except I advised her not to lift anything over 20 pounds.  Return visit in 6 weeks.

## 2012-03-12 ENCOUNTER — Emergency Department (HOSPITAL_COMMUNITY): Payer: Managed Care, Other (non HMO)

## 2012-03-12 ENCOUNTER — Emergency Department (HOSPITAL_COMMUNITY)
Admission: EM | Admit: 2012-03-12 | Discharge: 2012-03-12 | Disposition: A | Discharge status: Home / Self Care | Payer: Managed Care, Other (non HMO) | Attending: Emergency Medicine | Admitting: Emergency Medicine

## 2012-03-12 DIAGNOSIS — S82143A Displaced bicondylar fracture of unspecified tibia, initial encounter for closed fracture: Secondary | ICD-10-CM

## 2012-03-12 DIAGNOSIS — Z79899 Other long term (current) drug therapy: Secondary | ICD-10-CM | POA: Insufficient documentation

## 2012-03-12 DIAGNOSIS — S82109A Unspecified fracture of upper end of unspecified tibia, initial encounter for closed fracture: Secondary | ICD-10-CM | POA: Insufficient documentation

## 2012-03-12 DIAGNOSIS — Y92009 Unspecified place in unspecified non-institutional (private) residence as the place of occurrence of the external cause: Secondary | ICD-10-CM | POA: Insufficient documentation

## 2012-03-12 DIAGNOSIS — W010XXA Fall on same level from slipping, tripping and stumbling without subsequent striking against object, initial encounter: Secondary | ICD-10-CM | POA: Insufficient documentation

## 2012-03-12 DIAGNOSIS — I129 Hypertensive chronic kidney disease with stage 1 through stage 4 chronic kidney disease, or unspecified chronic kidney disease: Secondary | ICD-10-CM | POA: Insufficient documentation

## 2012-03-12 DIAGNOSIS — N189 Chronic kidney disease, unspecified: Secondary | ICD-10-CM | POA: Insufficient documentation

## 2012-03-12 MED ORDER — OXYCODONE-ACETAMINOPHEN 5-325 MG PO TABS: 1.0000 | ORAL_TABLET | Freq: Four times a day (QID) | ORAL | Status: DC | PRN

## 2012-03-12 MED ORDER — OXYCODONE-ACETAMINOPHEN 5-325 MG PO TABS
2.0000 | ORAL_TABLET | Freq: Once | ORAL | Status: AC
  Administered 2012-03-12: 2 via ORAL
  Filled 2012-03-12: qty 2

## 2012-03-12 MED ORDER — HYDROMORPHONE HCL PF 1 MG/ML IJ SOLN: 1.0000 mg | Freq: Once | INTRAMUSCULAR | Status: DC

## 2012-03-12 NOTE — ED Notes (Signed)
Pt states that she was stepping over a gate and lost her balance and twisted her left knee.  Pt states it is swollen.

## 2012-03-12 NOTE — ED Provider Notes (Signed)
History     CSN: [id number]  Arrival date & time 03/12/12  [id number]   First MD Initiated Contact with Patient 03/12/12 2000      Chief Complaint  Patient presents with  . Knee Pain    (Consider location/radiation/quality/duration/timing/severity/associated sxs/prior treatment) HPI Comments: Patient states that just prior to arrival she stepped over her dog gate and tripped.  She then landed on her left knee.  She has been having pain and swelling of the left knee since that time.  Pain worse with movement and ambulation.  She took Advil for pain just prior to arrival and felt that it helped the pain.  She states that her pain is tolerable at this time.   She denies any numbness or tingling.  No changes in skin color.  No prior injury to the knee.    Patient is a 61 y.o. female presenting with knee pain. The history is provided by the patient.  Knee Pain Associated symptoms include joint swelling. Pertinent negatives include no chills, fever or numbness.    Past Medical History  Diagnosis Date  . HYPOTHYROIDISM 10/06/2008  . HYPERLIPIDEMIA 10/06/2008  . DEPRESSION 02/23/2010  . DIVERTICULITIS, ACUTE 11/10/2008  . ALLERGIC RHINITIS 07/23/2009  . Diverticulosis of colon 11/10/2008  . Asthma 11-17-11    no flareups in adult yrs. Albuterol used rarely  . 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 Coumadin-all resolved. no coumadin since 11/09/11  . Chronic kidney disease 11-17-11    hx. past multiple kidney stones, prev. lithos  . 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 Anemia   . Arthritis     osteoarthritis  . Pneumonia 11-17-11    1'13 postop pneumonia.   Marland Kitchen HYPERTENSION 10/06/2008    EKG 12/12 EPIC    Past Surgical History  Procedure Date  . Thyroid surgery 1982    tumor removed  . Cesarean section 1983  . Rotator cuff repair 11-17-11    left '05  . Kidney stone surgery 2000, 2009, 2010  . Laparoscopic assissted total colectomy w/ j-pouch 03/01/11  .  Ileostomy closure 05/02/2011    ileosomy takedown  . Laparotomy 05/14/2011    small bowel resection, ileostomy  . Abdominal hysterectomy 1986    fibroids  . Eye surgery     lasik  bilaterally  . Appendectomy   . Colon surgery 12/12    ileostomy   . Ileo loop colostomy closure 01/18/2012    Procedure: LAPAROSCOPIC ILEO LOOP COLOSTOMY CLOSURE;  Surgeon: Adolph Pollack, MD;  Location: [facility];  Service: General;  Laterality: N/A;  Laparoscopic Ileostomy Closure , lysis of adhesions     Family History  Problem Relation Age of Onset  . Arthritis Other   . Mental illness Other   . COPD Mother   . Depression Mother   . Deep vein thrombosis Father   . Depression Father     History  Substance Use Topics  . Smoking status: Former Smoker -- 1.0 packs/day for 10 years    Types: Cigarettes    Quit date: 08/02/1980  . Smokeless tobacco: Never Used  . Alcohol Use: No    OB History    Grav Para Term Preterm Abortions TAB SAB Ect Mult Living                  Review of Systems  Constitutional: Negative for fever and chills.  Musculoskeletal: Positive for joint swelling and gait problem.  Left knee pain  Skin: Negative for color change.  Neurological: Negative for numbness.  All other systems reviewed and are negative.    Allergies  Modafinil  Home Medications   Current Outpatient Rx  Name Route Sig Dispense Refill  . FERROUS GLUCONATE 225 (27 FE) MG PO TABS Oral Take 240 mg by mouth daily.    . IBUPROFEN 200 MG PO TABS Oral Take 800 mg by mouth every 6 (six) hours as needed. pain    . LEVOTHYROXINE SODIUM 150 MCG PO TABS Oral Take 150 mcg by mouth daily before breakfast. Prescribe non-generic.    Marland Kitchen LOSARTAN POTASSIUM 50 MG PO TABS Oral Take 50 mg by mouth daily before breakfast.    . MELOXICAM 15 MG PO TABS Oral Take 15 mg by mouth daily.    Marland Kitchen MONTELUKAST SODIUM 10 MG PO TABS Oral Take 10 mg by mouth at bedtime.     Marland Kitchen ONE-DAILY MULTI VITAMINS PO TABS Oral Take 1  tablet by mouth daily.    . SERTRALINE HCL 100 MG PO TABS Oral Take 150 mg by mouth daily. Takes along with 50 mg tablet to equal 150 mg at night    . SERTRALINE HCL 50 MG PO TABS Oral Take 150 mg by mouth daily. Takes with 100 mg tablet to equal 150 mg at night    . VITAMIN A 8000 UNITS PO CAPS Oral Take 8,000 Units by mouth daily.    Marland Kitchen VITAMIN D (ERGOCALCIFEROL) 50000 UNITS PO CAPS Oral Take 50,000 Units by mouth every 7 (seven) days. On fridays    . ZINC GLUCONATE 50 MG PO TABS Oral Take 50 mg by mouth daily.    Marland Kitchen ZOLPIDEM TARTRATE 10 MG PO TABS Oral Take 10 mg by mouth at bedtime.      BP 113/71  Pulse 94  Temp 98.8 F (37.1 C) (Oral)  Resp 18  SpO2 100%  Physical Exam  Nursing note and vitals reviewed. Constitutional: She appears well-developed and well-nourished. No distress.  HENT:  Head: Normocephalic and atraumatic.  Cardiovascular: Normal rate, regular rhythm and normal heart sounds.   Pulses:      Dorsalis pedis pulses are 2+ on the right side, and 2+ on the left side.  Pulmonary/Chest: Effort normal and breath sounds normal.  Musculoskeletal:       Left knee: She exhibits decreased range of motion, swelling and bony tenderness. She exhibits no effusion, no ecchymosis, no deformity and no erythema.       Left ankle: She exhibits normal range of motion.  Neurological: She is alert. No sensory deficit.  Skin: Skin is warm, dry and intact. She is not diaphoretic. No erythema.  Psychiatric: She has a normal mood and affect.    ED Course  Procedures (including critical care time)  Labs Reviewed - No data to display Ct Knee Left Wo Contrast  03/12/2012  *RADIOLOGY REPORT*  Clinical Data: Lateral tibial plateau fracture.  CT OF THE LEFT KNEE WITHOUT CONTRAST  Technique:  Multidetector CT imaging was performed according to the standard protocol. Multiplanar CT image reconstructions were also generated.  Comparison: Earlier today plain film.  09/05/2007 plain film.  Findings:  Small to moderate suprapatellar joint effusion.  Mild medial and patellofemoral compartment osteoarthritis.  Degenerative irregularity of the proximal tibia/fibular joint.  Lateral tibial plateau fracture.  Maximally 4 mm of articular step- off on sagittal image 50 of series 603.  The fracture exits the anterior lateral aspect of the tibia, including on  image 54 sagittal and image 30 coronal.  There is also fracture extension into the more posterior lateral tibia on image 35 coronal.  No extension in the medial tibial plateau.  Femoral condyles and proximal fibula intact.  IMPRESSION: Lateral tibial plateau fracture, with maximally 4 mm of articular step-off.   Original Report Authenticated By: Consuello Bossier, M.D.    Dg Knee Complete 4 Views Left  03/12/2012  *RADIOLOGY REPORT*  Clinical Data: Larey Seat.  LEFT KNEE - COMPLETE 4+ VIEW  Comparison: 09/05/2007.  Findings: There is a depressed lateral tibial plateau fracture with associated large joint effusion.  No fractures of the tibia, patella or fibula are identified.  IMPRESSION: Lateral tibial plateau fracture.   Original Report Authenticated By: P. Loralie Champagne, M.D.      No diagnosis found.  Discussed with Dr. Carola Frost with Orthopedics.  He is recommending getting a CT of the knee.  He recommends having the patient admitted to his service.  However, he states that if the patient really does not want to be admitted that she can be discharged home and follow up in the office.  Discussed the options with the patient and told her that admission is recommended.  Patient decided that she would like to be discharged and follow up in the office.  Informed Dr. Carola Frost of the patient's wishes.  He recommends putting the knee in a knee immobilizer with ICE and ACE wrap as well as a [employer]. Patient also given crutches.   Instructions given to Orthopedic Tech who followed these instructions.  Patient instructed to elevate her knee above the level of her heart.   Patient discharged home with pain medication.  Strict return precautions discussed with the patient.    MDM  Patient with Tibial Plateau fracture.  Discussed with Dr. Carola Frost with Orthopedics.  See discussion above.  Patient is neurovascularly intact.  Patient discharged home and will follow up in the office with Dr. Carola Frost tomorrow.  Return precautions discussed with the patient.          Pascal Lux McDonald, PA-C 03/13/12 1348

## 2012-03-13 NOTE — ED Provider Notes (Signed)
Medical screening examination/treatment/procedure(s) were performed by non-physician practitioner and as supervising physician I was immediately available for consultation/collaboration.   Charli Liberatore, MD 03/13/12 1504 

## 2012-03-18 ENCOUNTER — Other Ambulatory Visit (INDEPENDENT_AMBULATORY_CARE_PROVIDER_SITE_OTHER): Payer: Managed Care, Other (non HMO)

## 2012-03-18 DIAGNOSIS — R5383 Other fatigue: Secondary | ICD-10-CM

## 2012-03-18 DIAGNOSIS — R5381 Other malaise: Secondary | ICD-10-CM

## 2012-03-18 DIAGNOSIS — E039 Hypothyroidism, unspecified: Secondary | ICD-10-CM

## 2012-03-18 DIAGNOSIS — E559 Vitamin D deficiency, unspecified: Secondary | ICD-10-CM

## 2012-03-18 LAB — COMPREHENSIVE METABOLIC PANEL
AST: 26 U/L (ref 0–37)
Alkaline Phosphatase: 57 U/L (ref 39–117)
BUN: 13 mg/dL (ref 6–23)
Calcium: 9.7 mg/dL (ref 8.4–10.5)
Creatinine, Ser: 1 mg/dL (ref 0.4–1.2)
Total Bilirubin: 0.3 mg/dL (ref 0.3–1.2)

## 2012-03-18 LAB — TSH: TSH: 69.76 u[IU]/mL — ABNORMAL HIGH (ref 0.35–5.50)

## 2012-03-18 LAB — CBC
HCT: 30.1 % — ABNORMAL LOW (ref 36.0–46.0)
MCHC: 33.1 g/dL (ref 30.0–36.0)
MCV: 91.9 fl (ref 78.0–100.0)
RDW: 14.5 % (ref 11.5–14.6)

## 2012-03-18 LAB — PHOSPHORUS: Phosphorus: 2.7 mg/dL (ref 2.3–4.6)

## 2012-03-19 LAB — PREALBUMIN: Prealbumin: 28.7 mg/dL (ref 17.0–34.0)

## 2012-03-19 LAB — PTH, INTACT AND CALCIUM: Calcium, Total (PTH): 10 mg/dL (ref 8.4–10.5)

## 2012-03-19 NOTE — Progress Notes (Signed)
Quick Note:  Pt informed and yes, she is taking the thyroid daily and Vit D every week and daily multivitamin with iron. Pt Ortho Dr. Myrene Galas ordered these labs, she requested they be faxed to him. Labs faxed. ______

## 2012-03-27 ENCOUNTER — Other Ambulatory Visit: Payer: Self-pay | Admitting: *Deleted

## 2012-03-27 DIAGNOSIS — E039 Hypothyroidism, unspecified: Secondary | ICD-10-CM

## 2012-03-27 MED ORDER — SYNTHROID 175 MCG PO TABS: 175.0000 ug | ORAL_TABLET | Freq: Every day | ORAL | Status: DC

## 2012-03-27 NOTE — Progress Notes (Signed)
Quick Note:  Pt informed, orders sent and yes she is faithfully taking her Synthroid ______

## 2012-04-17 ENCOUNTER — Ambulatory Visit (INDEPENDENT_AMBULATORY_CARE_PROVIDER_SITE_OTHER): Payer: Managed Care, Other (non HMO) | Admitting: General Surgery

## 2012-04-17 ENCOUNTER — Encounter (INDEPENDENT_AMBULATORY_CARE_PROVIDER_SITE_OTHER): Payer: Self-pay | Admitting: General Surgery

## 2012-04-17 VITALS — BP 118/78 | HR 84 | Temp 98.1°F | Resp 20 | Ht 68.0 in | Wt 186.0 lb

## 2012-04-17 DIAGNOSIS — Z9889 Other specified postprocedural states: Secondary | ICD-10-CM

## 2012-04-17 NOTE — Progress Notes (Signed)
Procedure:  Ileostomy closure  Date:  01/18/12  Pathology:  Benign  History:  She is here for her third postoperative visit. She is tolerating her diet.  She is still have episodes of fecal incontinence where she has the urge to go and cannot make it to the bathroom in time.  This typically occurs in the late afternoon or early evening.  She fell and broke her tibia and is in a brace for that.  Still has poor endurance. Exam: Abdomen-soft, LUQ wound has healed, large ventral hernia is present.   Assessment: She is very slowly getting better. Lack of endurance distill one of her main issues. Intermittent incontinence as discussed above is also an issue.  Plan:  I told her to try to take Imodium 30-60 minutes before the evening episodes. She is actually done that before and it has helped. I will now see her back on a when necessary basis.

## 2012-04-17 NOTE — Patient Instructions (Signed)
Start doing Kegel exercises.  Use the Imodium as we discussed.  Avoid repetitive heavy lifting.

## 2012-05-28 ENCOUNTER — Other Ambulatory Visit: Payer: Self-pay | Admitting: Family Medicine

## 2012-05-28 NOTE — Telephone Encounter (Signed)
Last OV 02/09/12. Med hasn't been filled here before

## 2012-05-28 NOTE — Telephone Encounter (Signed)
Refill for 6 months. 

## 2012-07-04 ENCOUNTER — Encounter (HOSPITAL_COMMUNITY): Payer: Self-pay

## 2012-07-04 ENCOUNTER — Emergency Department (HOSPITAL_COMMUNITY): Payer: Managed Care, Other (non HMO)

## 2012-07-04 ENCOUNTER — Inpatient Hospital Stay (HOSPITAL_COMMUNITY)
Admission: EM | Admit: 2012-07-04 | Discharge: 2012-07-07 | DRG: 390 | Disposition: A | Discharge status: Home / Self Care | Payer: Managed Care, Other (non HMO) | Attending: General Surgery | Admitting: General Surgery

## 2012-07-04 DIAGNOSIS — N189 Chronic kidney disease, unspecified: Secondary | ICD-10-CM | POA: Diagnosis present

## 2012-07-04 DIAGNOSIS — R159 Full incontinence of feces: Secondary | ICD-10-CM

## 2012-07-04 DIAGNOSIS — Z8719 Personal history of other diseases of the digestive system: Secondary | ICD-10-CM

## 2012-07-04 DIAGNOSIS — F3289 Other specified depressive episodes: Secondary | ICD-10-CM | POA: Diagnosis present

## 2012-07-04 DIAGNOSIS — Z90710 Acquired absence of both cervix and uterus: Secondary | ICD-10-CM

## 2012-07-04 DIAGNOSIS — Z87891 Personal history of nicotine dependence: Secondary | ICD-10-CM

## 2012-07-04 DIAGNOSIS — K56609 Unspecified intestinal obstruction, unspecified as to partial versus complete obstruction: Secondary | ICD-10-CM

## 2012-07-04 DIAGNOSIS — J45909 Unspecified asthma, uncomplicated: Secondary | ICD-10-CM | POA: Diagnosis present

## 2012-07-04 DIAGNOSIS — F329 Major depressive disorder, single episode, unspecified: Secondary | ICD-10-CM | POA: Diagnosis present

## 2012-07-04 DIAGNOSIS — I129 Hypertensive chronic kidney disease with stage 1 through stage 4 chronic kidney disease, or unspecified chronic kidney disease: Secondary | ICD-10-CM | POA: Diagnosis present

## 2012-07-04 DIAGNOSIS — K432 Incisional hernia without obstruction or gangrene: Secondary | ICD-10-CM | POA: Diagnosis present

## 2012-07-04 DIAGNOSIS — Z9089 Acquired absence of other organs: Secondary | ICD-10-CM

## 2012-07-04 DIAGNOSIS — Z86718 Personal history of other venous thrombosis and embolism: Secondary | ICD-10-CM

## 2012-07-04 DIAGNOSIS — E039 Hypothyroidism, unspecified: Secondary | ICD-10-CM | POA: Diagnosis present

## 2012-07-04 DIAGNOSIS — E785 Hyperlipidemia, unspecified: Secondary | ICD-10-CM | POA: Diagnosis present

## 2012-07-04 DIAGNOSIS — Z9049 Acquired absence of other specified parts of digestive tract: Secondary | ICD-10-CM

## 2012-07-04 DIAGNOSIS — Z888 Allergy status to other drugs, medicaments and biological substances status: Secondary | ICD-10-CM

## 2012-07-04 DIAGNOSIS — E669 Obesity, unspecified: Secondary | ICD-10-CM | POA: Diagnosis present

## 2012-07-04 DIAGNOSIS — Z8701 Personal history of pneumonia (recurrent): Secondary | ICD-10-CM

## 2012-07-04 DIAGNOSIS — M1990 Unspecified osteoarthritis, unspecified site: Secondary | ICD-10-CM | POA: Diagnosis present

## 2012-07-04 DIAGNOSIS — Z79899 Other long term (current) drug therapy: Secondary | ICD-10-CM

## 2012-07-04 DIAGNOSIS — K439 Ventral hernia without obstruction or gangrene: Secondary | ICD-10-CM

## 2012-07-04 HISTORY — DX: Pneumonia, unspecified organism: J18.9

## 2012-07-04 HISTORY — DX: Perforation of intestine (nontraumatic): K63.1

## 2012-07-04 HISTORY — DX: Nosocomial condition: Y95

## 2012-07-04 LAB — CBC WITH DIFFERENTIAL/PLATELET
Basophils Relative: 0 % (ref 0–1)
HCT: 37.3 % (ref 36.0–46.0)
Hemoglobin: 12.4 g/dL (ref 12.0–15.0)
Lymphs Abs: 1.9 10*3/uL (ref 0.7–4.0)
MCH: 29.6 pg (ref 26.0–34.0)
MCHC: 33.2 g/dL (ref 30.0–36.0)
Monocytes Absolute: 0.5 10*3/uL (ref 0.1–1.0)
Monocytes Relative: 5 % (ref 3–12)
Neutro Abs: 8.2 10*3/uL — ABNORMAL HIGH (ref 1.7–7.7)
Neutrophils Relative %: 76 % (ref 43–77)
RBC: 4.19 MIL/uL (ref 3.87–5.11)

## 2012-07-04 LAB — COMPREHENSIVE METABOLIC PANEL
Albumin: 4.5 g/dL (ref 3.5–5.2)
Alkaline Phosphatase: 67 U/L (ref 39–117)
BUN: 17 mg/dL (ref 6–23)
Chloride: 99 mEq/L (ref 96–112)
Creatinine, Ser: 0.89 mg/dL (ref 0.50–1.10)
GFR calc Af Amer: 81 mL/min — ABNORMAL LOW (ref >90)
GFR calc non Af Amer: 70 mL/min — ABNORMAL LOW (ref >90)
Glucose, Bld: 112 mg/dL — ABNORMAL HIGH (ref 70–99)
Potassium: 4.3 mEq/L (ref 3.5–5.1)
Total Bilirubin: 0.4 mg/dL (ref 0.3–1.2)

## 2012-07-04 LAB — URINE MICROSCOPIC-ADD ON

## 2012-07-04 LAB — URINALYSIS, ROUTINE W REFLEX MICROSCOPIC
Bilirubin Urine: NEGATIVE
Ketones, ur: NEGATIVE mg/dL
Nitrite: POSITIVE — AB
Protein, ur: NEGATIVE mg/dL
Urobilinogen, UA: 0.2 mg/dL (ref 0.0–1.0)

## 2012-07-04 LAB — LIPASE, BLOOD: Lipase: 29 U/L (ref 11–59)

## 2012-07-04 LAB — LACTIC ACID, PLASMA: Lactic Acid, Venous: 2.7 mmol/L — ABNORMAL HIGH (ref 0.5–2.2)

## 2012-07-04 MED ORDER — ONDANSETRON HCL 4 MG/2ML IJ SOLN: 4.0000 mg | INTRAMUSCULAR | Status: DC | PRN

## 2012-07-04 MED ORDER — IOHEXOL 300 MG/ML  SOLN
50.0000 mL | Freq: Once | INTRAMUSCULAR | Status: AC | PRN
  Administered 2012-07-04: 25 mL via ORAL

## 2012-07-04 MED ORDER — FENTANYL CITRATE 0.05 MG/ML IJ SOLN
50.0000 ug | Freq: Once | INTRAMUSCULAR | Status: AC
  Administered 2012-07-04: 50 ug via INTRAVENOUS
  Filled 2012-07-04: qty 2

## 2012-07-04 MED ORDER — IOHEXOL 300 MG/ML  SOLN
100.0000 mL | Freq: Once | INTRAMUSCULAR | Status: AC | PRN
  Administered 2012-07-04: 100 mL via INTRAVENOUS

## 2012-07-04 MED ORDER — ONDANSETRON HCL 4 MG/2ML IJ SOLN
4.0000 mg | Freq: Once | INTRAMUSCULAR | Status: AC
  Administered 2012-07-04: 4 mg via INTRAVENOUS
  Filled 2012-07-04: qty 2

## 2012-07-04 MED ORDER — MORPHINE SULFATE 4 MG/ML IJ SOLN
4.0000 mg | INTRAMUSCULAR | Status: DC | PRN
  Administered 2012-07-04: 4 mg via INTRAVENOUS
  Filled 2012-07-04: qty 1

## 2012-07-04 MED ORDER — SODIUM CHLORIDE 0.9 % IV SOLN
INTRAVENOUS | Status: DC
  Administered 2012-07-04: 22:00:00 via INTRAVENOUS

## 2012-07-04 NOTE — ED Notes (Signed)
Notified Pt that urine is needed.   

## 2012-07-04 NOTE — ED Notes (Signed)
Pt has had several abdominal surgeries in the past two years, this morning she started to have severe abd pain, no vomiting, severe nausea, no BM in 24 hours. Dr Purnell Shoemaker is her surgeon, pt called [facility] tonight and told her to come here.

## 2012-07-05 ENCOUNTER — Inpatient Hospital Stay (HOSPITAL_COMMUNITY): Payer: Managed Care, Other (non HMO)

## 2012-07-05 ENCOUNTER — Encounter (HOSPITAL_COMMUNITY): Payer: Self-pay | Admitting: Surgery

## 2012-07-05 DIAGNOSIS — K56609 Unspecified intestinal obstruction, unspecified as to partial versus complete obstruction: Secondary | ICD-10-CM | POA: Diagnosis present

## 2012-07-05 DIAGNOSIS — R159 Full incontinence of feces: Secondary | ICD-10-CM

## 2012-07-05 LAB — CBC
HCT: 33.8 % — ABNORMAL LOW (ref 36.0–46.0)
MCV: 89.4 fL (ref 78.0–100.0)
RBC: 3.78 MIL/uL — ABNORMAL LOW (ref 3.87–5.11)
WBC: 6.8 10*3/uL (ref 4.0–10.5)

## 2012-07-05 LAB — BASIC METABOLIC PANEL
CO2: 26 mEq/L (ref 19–32)
Chloride: 103 mEq/L (ref 96–112)
Sodium: 141 mEq/L (ref 135–145)

## 2012-07-05 MED ORDER — LORAZEPAM 2 MG/ML IJ SOLN: 0.5000 mg | Freq: Three times a day (TID) | INTRAMUSCULAR | Status: DC | PRN

## 2012-07-05 MED ORDER — CHLORHEXIDINE GLUCONATE 0.12 % MT SOLN
15.0000 mL | Freq: Two times a day (BID) | OROMUCOSAL | Status: DC
  Administered 2012-07-05 – 2012-07-06 (×4): 15 mL via OROMUCOSAL
  Filled 2012-07-05: qty 15

## 2012-07-05 MED ORDER — KCL IN DEXTROSE-NACL 40-5-0.9 MEQ/L-%-% IV SOLN
INTRAVENOUS | Status: DC
  Administered 2012-07-05 – 2012-07-07 (×3): via INTRAVENOUS
  Filled 2012-07-05 (×6): qty 1000

## 2012-07-05 MED ORDER — ZOLPIDEM TARTRATE 5 MG PO TABS
5.0000 mg | ORAL_TABLET | Freq: Every evening | ORAL | Status: DC | PRN
  Administered 2012-07-05 – 2012-07-06 (×3): 5 mg via ORAL
  Filled 2012-07-05 (×3): qty 1

## 2012-07-05 MED ORDER — HEPARIN SODIUM (PORCINE) 5000 UNIT/ML IJ SOLN
5000.0000 [IU] | Freq: Three times a day (TID) | INTRAMUSCULAR | Status: DC
  Administered 2012-07-05 – 2012-07-07 (×8): 5000 [IU] via SUBCUTANEOUS
  Filled 2012-07-05 (×11): qty 1

## 2012-07-05 MED ORDER — SERTRALINE HCL 50 MG PO TABS
150.0000 mg | ORAL_TABLET | Freq: Every day | ORAL | Status: DC
  Administered 2012-07-05 – 2012-07-07 (×3): 150 mg via ORAL
  Filled 2012-07-05 (×3): qty 1

## 2012-07-05 MED ORDER — LACTATED RINGERS IV BOLUS (SEPSIS): 1000.0000 mL | Freq: Three times a day (TID) | INTRAVENOUS | Status: AC | PRN

## 2012-07-05 MED ORDER — LACTATED RINGERS IV BOLUS (SEPSIS)
1000.0000 mL | Freq: Once | INTRAVENOUS | Status: AC
  Administered 2012-07-05: 1000 mL via INTRAVENOUS

## 2012-07-05 MED ORDER — PROMETHAZINE HCL 25 MG/ML IJ SOLN
12.5000 mg | Freq: Four times a day (QID) | INTRAMUSCULAR | Status: DC | PRN
  Administered 2012-07-05: 25 mg via INTRAVENOUS
  Filled 2012-07-05 (×2): qty 1

## 2012-07-05 MED ORDER — MAGIC MOUTHWASH
15.0000 mL | Freq: Four times a day (QID) | ORAL | Status: DC | PRN
  Filled 2012-07-05: qty 15

## 2012-07-05 MED ORDER — BISACODYL 10 MG RE SUPP: 10.0000 mg | Freq: Two times a day (BID) | RECTAL | Status: DC | PRN

## 2012-07-05 MED ORDER — ONDANSETRON HCL 4 MG/2ML IJ SOLN: 4.0000 mg | Freq: Four times a day (QID) | INTRAMUSCULAR | Status: DC | PRN

## 2012-07-05 MED ORDER — METOPROLOL TARTRATE 1 MG/ML IV SOLN
5.0000 mg | Freq: Four times a day (QID) | INTRAVENOUS | Status: DC | PRN
  Filled 2012-07-05: qty 5

## 2012-07-05 MED ORDER — PHENOL 1.4 % MT LIQD
2.0000 | OROMUCOSAL | Status: DC | PRN
  Filled 2012-07-05: qty 177

## 2012-07-05 MED ORDER — METOCLOPRAMIDE HCL 5 MG/ML IJ SOLN: 5.0000 mg | Freq: Four times a day (QID) | INTRAMUSCULAR | Status: DC | PRN

## 2012-07-05 MED ORDER — ALUM & MAG HYDROXIDE-SIMETH 200-200-20 MG/5ML PO SUSP
30.0000 mL | Freq: Four times a day (QID) | ORAL | Status: DC | PRN
  Filled 2012-07-05: qty 30

## 2012-07-05 MED ORDER — MONTELUKAST SODIUM 10 MG PO TABS
10.0000 mg | ORAL_TABLET | Freq: Every day | ORAL | Status: DC
  Administered 2012-07-06: 10 mg via ORAL
  Filled 2012-07-05 (×4): qty 1

## 2012-07-05 MED ORDER — DIPHENHYDRAMINE HCL 50 MG/ML IJ SOLN: 12.5000 mg | Freq: Four times a day (QID) | INTRAMUSCULAR | Status: DC | PRN

## 2012-07-05 MED ORDER — MENTHOL 3 MG MT LOZG
1.0000 | LOZENGE | OROMUCOSAL | Status: DC | PRN
  Filled 2012-07-05: qty 9

## 2012-07-05 MED ORDER — HYDROMORPHONE HCL PF 1 MG/ML IJ SOLN
0.5000 mg | INTRAMUSCULAR | Status: DC | PRN
  Administered 2012-07-05 (×4): 1 mg via INTRAVENOUS
  Filled 2012-07-05 (×4): qty 1

## 2012-07-05 MED ORDER — ACETAMINOPHEN 650 MG RE SUPP: 650.0000 mg | Freq: Four times a day (QID) | RECTAL | Status: DC | PRN

## 2012-07-05 MED ORDER — ACETAMINOPHEN 325 MG PO TABS
650.0000 mg | ORAL_TABLET | Freq: Four times a day (QID) | ORAL | Status: DC | PRN
  Administered 2012-07-05: 650 mg via ORAL
  Filled 2012-07-05: qty 2

## 2012-07-05 MED ORDER — LIP MEDEX EX OINT
1.0000 "application " | TOPICAL_OINTMENT | Freq: Two times a day (BID) | CUTANEOUS | Status: DC
  Administered 2012-07-05 – 2012-07-06 (×4): 1 via TOPICAL
  Filled 2012-07-05: qty 7

## 2012-07-05 MED ORDER — SERTRALINE HCL 50 MG PO TABS: 150.0000 mg | ORAL_TABLET | Freq: Every day | ORAL | Status: DC

## 2012-07-05 MED ORDER — LEVOTHYROXINE SODIUM 175 MCG PO TABS
175.0000 ug | ORAL_TABLET | Freq: Every day | ORAL | Status: DC
  Administered 2012-07-05 – 2012-07-07 (×3): 175 ug via ORAL
  Filled 2012-07-05 (×4): qty 1

## 2012-07-05 MED ORDER — DIPHENHYDRAMINE HCL 12.5 MG/5ML PO ELIX: 12.5000 mg | ORAL_SOLUTION | Freq: Four times a day (QID) | ORAL | Status: DC | PRN

## 2012-07-05 MED ORDER — BIOTENE DRY MOUTH MT LIQD
15.0000 mL | Freq: Two times a day (BID) | OROMUCOSAL | Status: DC
  Administered 2012-07-05: 15 mL via OROMUCOSAL
  Filled 2012-07-05: qty 15

## 2012-07-05 NOTE — Progress Notes (Signed)
Utilization review completed.  

## 2012-07-05 NOTE — ED Notes (Signed)
Dr Richrd Prime in room talking with pt and family about lab and CT results  Plan of care discussed with them as well

## 2012-07-05 NOTE — Progress Notes (Signed)
Patient had a large bowel movement, with small amount of blood, and large amount of gas.  Philomena Doheny RN

## 2012-07-05 NOTE — H&P (Signed)
[name]  1950-09-05 [id number]   This patient is a 62 y.o.female who presents today for surgical evaluation at the request of Dr. Verne Spurr, [facility] ED.   Reason for evaluation: Nausea vomiting abdominal pain and bowel obstruction.  Pleasant obese female who has had numerous abdominal surgeries..   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 This was complicated by a small anastomotic leak that required exploratory laparotomy, drainage of pelvic abscess, and looped ileostomy March 18, 2011. The anastomotic leak sealed and she underwent closure of loop ileostomy May 02, 2011. This was complicated by a leak from the small bowel anastomosis and she underwent exploratory laparotomy, resection of small bowel anastomosis, an end ileostomy May 14, 2011. She end up being readmitted to the hospital with a postoperative pneumonia May 28, 2011.  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  She underwent lysis adhesions and takedown of her ileostomy and closure by Dr. Abbey Chatters in August 2013.  That was complicated by wound infection which gradually healed.    She has since developed a ventral hernia.  There was discussion about repairing her recurrent ventral hernia.  The family recalls implying a referral to a major academic Center.  They were holding off on that.  Patient had a severe abdominal pain starting yesterday.  Has developed a nausea and near vomiting.  No flatus for 24 hours.  She tends to have five or six loose bowel movements a day with most of her colon absent & an ileorectal anastomosis.  Usually takes Imodium QAC/QHS to be able to do that.  No bowel movements today.  Abdomen more distended and nauseated.  Husband is concerned.  He called tonight with these concerns.  I recommended emergency room evaluation given her severe abdominal pain and nausea.  Workup by Dr. Clarene Duke suspicious for bowel  obstruction on CAT scan and physical exam.  Periumbilical ventral hernia is reducible but sensitive.  No personal nor family history of GI/colon cancer, inflammatory bowel disease, irritable bowel syndrome, allergy such as Celiac Sprue, dietary/dairy problems, colitis, ulcers nor gastritis.  No recent sick contacts/gastroenteritis.  No travel outside the country.  No changes in diet.     Past Medical History  Diagnosis Date  . HYPOTHYROIDISM 10/06/2008  . HYPERLIPIDEMIA 10/06/2008  . DEPRESSION 02/23/2010  . ALLERGIC RHINITIS 07/23/2009  . Diverticulosis of colon 11/10/2008  . Asthma 11-17-11    no flareups in adult yrs. Albuterol used rarely  . 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 Coumadin-all resolved. no coumadin since 11/09/11  . Chronic kidney disease 11-17-11    hx. past multiple kidney stones, prev. lithos  . Anemia   . Arthritis     osteoarthritis  . Pneumonia 11-17-11    1'13 postop pneumonia.   Marland Kitchen HYPERTENSION 10/06/2008    EKG 12/12 EPIC  . 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 Hospital-acquired pneumonia 05/28/2011    Past Surgical History  Procedure Date  . Thyroid surgery 1982    tumor removed  . Cesarean section 1983  . Rotator cuff repair 11-17-11    left '05  . Kidney stone surgery 2000, 2009, 2010  . Laparoscopic assissted total colectomy w/ j-pouch 03/01/11  . Ileostomy closure 05/02/2011    ileosomy takedown  . Laparotomy 05/14/2011    small bowel resection, ileostomy  . Abdominal hysterectomy 1986    fibroids  .  Eye surgery     lasik  bilaterally  . Appendectomy   . Colon surgery 12/12    ileostomy   . Ileo loop colostomy closure 01/18/2012    Procedure: LAPAROSCOPIC ILEO LOOP COLOSTOMY CLOSURE;  Surgeon: Adolph Pollack, MD;  Location: [facility] ORS;  Service: General;  Laterality: N/A;  Laparoscopic Ileostomy Closure , lysis of adhesions     History   Social History  . Marital Status:  Married    Spouse Name: N/A    Number of Children: N/A  . Years of Education: N/A   Occupational History  . Not on file.   Social History Main Topics  . Smoking status: Former Smoker -- 1.0 packs/day for 10 years    Types: Cigarettes    Quit date: 08/02/1980  . Smokeless tobacco: Never Used  . Alcohol Use: No  . Drug Use: No  . Sexually Active: Yes   Other Topics Concern  . Not on file   Social History Narrative  . No narrative on file    Family History  Problem Relation Age of Onset  . Arthritis Other   . Mental illness Other   . COPD Mother   . Depression Mother   . Deep vein thrombosis Father   . Depression Father     Current Facility-Administered Medications  Medication Dose Route Frequency Provider Last Rate Last Dose  . acetaminophen (TYLENOL) tablet 650 mg  650 mg Oral Q6H PRN Ardeth Sportsman, MD       Or  . acetaminophen (TYLENOL) suppository 650 mg  650 mg Rectal Q6H PRN Ardeth Sportsman, MD      . alum & mag hydroxide-simeth (MAALOX/MYLANTA) 200-200-20 MG/5ML suspension 30 mL  30 mL Oral Q6H PRN Ardeth Sportsman, MD      . bisacodyl (DULCOLAX) suppository 10 mg  10 mg Rectal Q12H PRN Ardeth Sportsman, MD      . dextrose 5 % and 0.9 % NaCl with KCl 40 mEq/L infusion   Intravenous Continuous Ardeth Sportsman, MD      . diphenhydrAMINE (BENADRYL) injection 12.5-25 mg  12.5-25 mg Intravenous Q6H PRN Ardeth Sportsman, MD       Or  . diphenhydrAMINE (BENADRYL) 12.5 MG/5ML elixir 12.5-25 mg  12.5-25 mg Oral Q6H PRN Ardeth Sportsman, MD      . heparin injection 5,000 Units  5,000 Units Subcutaneous Q8H Ardeth Sportsman, MD      . HYDROmorphone (DILAUDID) injection 0.5-2 mg  0.5-2 mg Intravenous Q2H PRN Ardeth Sportsman, MD      . lactated ringers bolus 1,000 mL  1,000 mL Intravenous Once Ardeth Sportsman, MD      . lactated ringers bolus 1,000 mL  1,000 mL Intravenous Q8H PRN Ardeth Sportsman, MD      . levothyroxine (SYNTHROID, LEVOTHROID) tablet 175 mcg  175 mcg Oral Daily  Ardeth Sportsman, MD      . lip balm (CARMEX) ointment 1 application  1 application Topical BID Ardeth Sportsman, MD      . LORazepam (ATIVAN) bolus via infusion 0.5-1 mg  0.5-1 mg Intravenous Q8H PRN Ardeth Sportsman, MD      . magic mouthwash  15 mL Oral QID PRN Ardeth Sportsman, MD      . menthol-cetylpyridinium (CEPACOL) lozenge 3 mg  1 lozenge Oral PRN Ardeth Sportsman, MD      . metoCLOPramide (REGLAN) injection 5-10 mg  5-10 mg Intravenous Q6H PRN  Ardeth Sportsman, MD      . metoprolol (LOPRESSOR) injection 5 mg  5 mg Intravenous Q6H PRN Ardeth Sportsman, MD      . montelukast (SINGULAIR) tablet 10 mg  10 mg Oral QHS Ardeth Sportsman, MD      . ondansetron [facility]) injection 4 mg  4 mg Intravenous Q6H PRN Ardeth Sportsman, MD      . phenol (CHLORASEPTIC) mouth spray 2 spray  2 spray Mouth/Throat PRN Ardeth Sportsman, MD      . promethazine (PHENERGAN) injection 12.5-25 mg  12.5-25 mg Intravenous Q6H PRN Ardeth Sportsman, MD      . sertraline (ZOLOFT) tablet 150 mg  150 mg Oral Daily Ardeth Sportsman, MD      . sertraline (ZOLOFT) tablet 150 mg  150 mg Oral Daily Ardeth Sportsman, MD      . zolpidem (AMBIEN) tablet 10 mg  10 mg Oral QHS PRN Ardeth Sportsman, MD       Current Outpatient Prescriptions  Medication Sig Dispense Refill  . ferrous gluconate (FERGON) 225 (27 FE) MG tablet Take 240 mg by mouth daily.      Marland Kitchen ibuprofen (ADVIL,MOTRIN) 200 MG tablet Take 800 mg by mouth every 6 (six) hours as needed. pain      . levothyroxine (SYNTHROID, LEVOTHROID) 150 MCG tablet Take 150 mcg by mouth daily before breakfast. Prescribe non-generic.      Marland Kitchen losartan (COZAAR) 50 MG tablet Take 50 mg by mouth daily before breakfast.      . meloxicam (MOBIC) 15 MG tablet Take 15 mg by mouth daily.      . montelukast (SINGULAIR) 10 MG tablet Take 10 mg by mouth at bedtime.       . Multiple Vitamin (MULTIVITAMIN) tablet Take 1 tablet by mouth daily.      . sertraline (ZOLOFT) 100 MG tablet Take 150 mg by mouth daily.  Takes along with 50 mg tablet to equal 150 mg at night      . sertraline (ZOLOFT) 50 MG tablet Take 150 mg by mouth daily. Takes with 100 mg tablet to equal 150 mg at night      . SYNTHROID 175 MCG tablet Take 1 tablet (175 mcg total) by mouth daily.  30 tablet  2  . vitamin A 8000 UNIT capsule Take 8,000 Units by mouth daily.      Marland Kitchen zinc gluconate 50 MG tablet Take 50 mg by mouth daily.      Marland Kitchen zolpidem (AMBIEN) 10 MG tablet take 1 tablet by mouth at bedtime if needed  30 tablet  5     Allergies  Allergen Reactions  . Modafinil Rash    Under breasts only.    ROS: Constitutional:  No fevers, chills, sweats.  Weight stable Eyes:  No vision changes, No discharge HENT:  No sore throats, nasal drainage Lymph: No neck swelling, No bruising easily Pulmonary:  No cough, productive sputum CV: No orthopnea, PND  Patient walks 15 minutes for about 1/4 miles without difficulty.  No exertional chest/neck/shoulder/arm pain. GI: No personal nor family history of GI/colon cancer, inflammatory bowel disease, irritable bowel syndrome, allergy such as Celiac Sprue, dietary/dairy problems, colitis, ulcers nor gastritis.  No recent sick contacts/gastroenteritis.  No travel outside the country.  No changes in diet. Renal: No UTIs, No hematuria Genital:  No drainage, bleeding, masses Musculoskeletal: No severe joint pain.  Good ROM major joints Skin:  No sores or lesions.  No rashes Heme/Lymph:  No easy bleeding.  No swollen lymph nodes Neuro: No focal weakness/numbness.  No seizures Psych: No suicidal ideation.  No hallucinations  BP 155/91  Pulse 93  Temp 98.4 F (36.9 C) (Oral)  Resp 20  SpO2 98%  Physical Exam: General: Pt awake/alert/oriented x4 in no major acute distress.  Seems tired.  Mildly anxious but consolable. Eyes: PERRL, normal EOM. Sclera nonicteric Neuro: CN II-XII intact w/o focal sensory/motor deficits. Lymph: No head/neck/groin lymphadenopathy Psych:  No  delerium/psychosis/paranoia HENT: Normocephalic, Mucus membranes moist.  No thrush Neck: Supple, No tracheal deviation Chest: No pain.  Good respiratory excursion. CV:  Pulses intact.  Regular rhythm Abdomen: Soft, Mildly distended.  Mildly tender at periumbilical VWH.  7x7cm VWH soft/reducible but sore at inferior edge.  Nondilated small bowel felt within the hernia sac and able to be reduced down. No phlegmon.  No cellulitis.  No rebound tenderness or guarding.  No physical signs of peritonitis..  No incarcerated hernias. Ext:  SCDs BLE.  No significant edema.  No cyanosis Skin: No petechiae / purpurea.  No major sores Musculoskeletal: No severe joint pain.  Good ROM major joints   Results:   Labs: Results for orders placed during the hospital encounter of 07/04/12 (from the past 48 hour(s))  CBC WITH DIFFERENTIAL     Status: Abnormal   Collection Time   07/04/12  9:35 PM      Component Value Range Comment   WBC 10.8 (*) 4.0 - 10.5 K/uL    RBC 4.19  3.87 - 5.11 MIL/uL    Hemoglobin 12.4  12.0 - 15.0 g/dL    HCT 46.9  62.9 - 52.8 %    MCV 89.0  78.0 - 100.0 fL    MCH 29.6  26.0 - 34.0 pg    MCHC 33.2  30.0 - 36.0 g/dL    RDW 41.3  24.4 - 01.0 %    Platelets 237  150 - 400 K/uL    Neutrophils Relative 76  43 - 77 %    Neutro Abs 8.2 (*) 1.7 - 7.7 K/uL    Lymphocytes Relative 17  12 - 46 %    Lymphs Abs 1.9  0.7 - 4.0 K/uL    Monocytes Relative 5  3 - 12 %    Monocytes Absolute 0.5  0.1 - 1.0 K/uL    Eosinophils Relative 2  0 - 5 %    Eosinophils Absolute 0.2  0.0 - 0.7 K/uL    Basophils Relative 0  0 - 1 %    Basophils Absolute 0.0  0.0 - 0.1 K/uL   COMPREHENSIVE METABOLIC PANEL     Status: Abnormal   Collection Time   07/04/12  9:35 PM      Component Value Range Comment   Sodium 140  135 - 145 mEq/L    Potassium 4.3  3.5 - 5.1 mEq/L    Chloride 99  96 - 112 mEq/L    CO2 26  19 - 32 mEq/L    Glucose, Bld 112 (*) 70 - 99 mg/dL    BUN 17  6 - 23 mg/dL    Creatinine, Ser 2.72   0.50 - 1.10 mg/dL    Calcium 53.6 (*) 8.4 - 10.5 mg/dL    Total Protein 8.3  6.0 - 8.3 g/dL    Albumin 4.5  3.5 - 5.2 g/dL    AST 30  0 - 37 U/L    ALT 31  0 -  35 U/L    Alkaline Phosphatase 67  39 - 117 U/L    Total Bilirubin 0.4  0.3 - 1.2 mg/dL    GFR calc non Af Amer 70 (*) >90 mL/min    GFR calc Af Amer 81 (*) >90 mL/min   LIPASE, BLOOD     Status: Normal   Collection Time   07/04/12  9:35 PM      Component Value Range Comment   Lipase 29  11 - 59 U/L   LACTIC ACID, PLASMA     Status: Abnormal   Collection Time   07/04/12 10:00 PM      Component Value Range Comment   Lactic Acid, Venous 2.7 (*) 0.5 - 2.2 mmol/L   URINALYSIS, ROUTINE W REFLEX MICROSCOPIC     Status: Abnormal   Collection Time   07/04/12 10:43 PM      Component Value Range Comment   Color, Urine YELLOW  YELLOW    APPearance CLEAR  CLEAR    Specific Gravity, Urine 1.019  1.005 - 1.030    pH 5.5  5.0 - 8.0    Glucose, UA NEGATIVE  NEGATIVE mg/dL    Hgb urine dipstick MODERATE (*) NEGATIVE    Bilirubin Urine NEGATIVE  NEGATIVE    Ketones, ur NEGATIVE  NEGATIVE mg/dL    Protein, ur NEGATIVE  NEGATIVE mg/dL    Urobilinogen, UA 0.2  0.0 - 1.0 mg/dL    Nitrite POSITIVE (*) NEGATIVE    Leukocytes, UA SMALL (*) NEGATIVE   URINE MICROSCOPIC-ADD ON     Status: Abnormal   Collection Time   07/04/12 10:43 PM      Component Value Range Comment   Squamous Epithelial / LPF RARE  RARE    WBC, UA 7-10  <3 WBC/hpf    RBC / HPF 3-6  <3 RBC/hpf    Bacteria, UA MANY (*) RARE     Imaging / Studies: Ct Abdomen Pelvis W Contrast  07/04/2012  *RADIOLOGY REPORT*  Clinical Data: Acute onset of severe abdominal pain.  Nausea. History of multiple prior abdominal surgeries.  CT ABDOMEN AND PELVIS WITH CONTRAST  Technique:  Multidetector CT imaging of the abdomen and pelvis was performed following the standard protocol during bolus administration of intravenous contrast.  Contrast: 25mL OMNIPAQUE IOHEXOL 300 MG/ML  SOLN, OMNIPAQUE  IOHEXOL 300 MG/ML  SOLN  Comparison: 11/07/2011  Findings: Focal scarring or pleural thickening in the left lung base posteriorly is stable since previous study.  Lung bases are otherwise clear.  Multiple low attenuation lesions consistent with cysts demonstrated throughout the liver, with the largest in segment four measuring 2.2 cm diameter. These are stable since previous study.  The spleen, gallbladder, pancreas, adrenal glands, and retroperitoneal lymph nodes are unremarkable.  Mild calcification and tortuosity of the aorta without aneurysm.  The kidneys demonstrate symmetrical nephrograms without hydronephrosis.  There are multiple sub centimeter parenchymal cysts bilaterally without solid mass identified. Nonobstructing stones are demonstrated in the upper and lower pole of the right kidney, largest in the upper pole measuring 5 mm diameter.  Nonobstructing stone in the left renal pelvis measures 11 mm.  Additional punctate nonobstructing stones in the left kidney.  Kidneys appear stable since previous study.  No free air or free fluid in the abdomen.  Postoperative changes with apparent partial colectomy and ileocolonic anastomoses.  There is a broad-based umbilical hernia which was present previously but there is new herniation of small bowel loops into the area with scarring  and infiltration in the mesenteric fat.  There is distension of herniated small bowel loops and proximal small bowel loops with small bowel feces sign.  The presumed:  The distal to the anastomoses is decompressed.  This is consistent with high-grade small bowel obstruction, possibly due to an anastomotic stricture or herniation.  Small interloop fluid collections are present.  The mesenteric and portal vessels appear patent without evidence of recurrent mesenteric thrombosis.  Pelvis:  There appears to be an additional rectosigmoid colonic anastomoses.  The bladder is decompressed.  No free or loculated pelvic fluid collections.  No  significant pelvic lymphadenopathy. Degenerative changes in the spine.  IMPRESSION: There is evidence of high-grade small bowel obstruction at the level of a ileocolic anastomoses coexisting with herniation of small bowel into an umbilical hernia.  Cause could be an anastomotic stricture versus herniation.  Interloop fluid collections are demonstrated without bowel wall thickening.  No evidence of vascular thrombosis.   Original Report Authenticated By: Burman Nieves, M.D.     Medications / Allergies: per chart  Antibiotics: Anti-infectives    None      Assessment  Pepper Kerrick  62 y.o. female       Problem List:  Active Problems:  SBO (small bowel obstruction)  Fecal incontinence   Small bowel obstruction.  Near anastomosis.  Question of anastomotic stricture.  Plan:  Admit  Nasogastric tube decompression with bowel rest  IV fluids  Followup x-ray to see if contrast has moved into rectum.  Serial examinations.  Try and hold off on any surgery since patient's had numerous operations.  However, periumbilical ventral hernia a risk factor in the future for future problems.  The patient is stable.  There is no evidence of peritonitis, acute abdomen, nor shock.  There is no strong evidence of failure of improvement nor decline with current non-operative management.  There is no need for surgery at the present moment.  We will continue to follow.  Try to hold off on revision of anastomotic stricture possible, especially with prior operations done.  -VTE prophylaxis- SCDs, etc  -mobilize as tolerated to help recovery    Ardeth Sportsman, M.D., F.A.C.S. Gastrointestinal and Minimally Invasive Surgery [facility] [address] [place], [place] [address] [phone] Main / Paging [phone] Voice Mail   07/05/2012  CARE TEAM:  PCP: Kristian Covey, MD  Outpatient Care Team: Patient Care Team: Kristian Covey, MD as PCP  - General Mariella Saa, MD as Consulting Physician (General Surgery) Hilarie Fredrickson, MD as Consulting Physician (Gastroenterology)  Inpatient Treatment Team: Treatment Team: Attending Provider: Laray Anger, DO; Registered Nurse: Orland Penman, RN; Technician: Meryl Dare, NT; Consulting Physician: Bishop Limbo, MD; Rounding Team: Bishop Limbo, MD

## 2012-07-05 NOTE — ED Notes (Signed)
#  16 FR NG tube inserted into the right nare without difficulty  Pt tolerated well  500 cc of brown liquid noted immediately on return

## 2012-07-05 NOTE — ED Provider Notes (Signed)
History     CSN: [id number]  Arrival date & time 07/04/12  2004   First MD Initiated Contact with Patient 07/04/12 2119      Chief Complaint  Patient presents with  . Abdominal Pain     HPI Pt was seen at 2140.   Per pt, c/o gradual onset and worsening of constant generalized abd "pain" since this morning.  Has been associated with nausea and decreased stool output.  Denies vomiting, no change in her chronic daily diarrheal stools before today, no fevers, no back pain, no rash, no CP/SOB, no black or blood in stools.       Past Medical History  Diagnosis Date  . HYPOTHYROIDISM 10/06/2008  . HYPERLIPIDEMIA 10/06/2008  . DEPRESSION 02/23/2010  . DIVERTICULITIS, ACUTE 11/10/2008  . ALLERGIC RHINITIS 07/23/2009  . Diverticulosis of colon 11/10/2008  . Asthma 11-17-11    no flareups in adult yrs. Albuterol used rarely  . 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 Coumadin-all resolved. no coumadin since 11/09/11  . Chronic kidney disease 11-17-11    hx. past multiple kidney stones, prev. lithos  . 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 Anemia   . Arthritis     osteoarthritis  . Pneumonia 11-17-11    1'13 postop pneumonia.   Marland Kitchen HYPERTENSION 10/06/2008    EKG 12/12 EPIC    Past Surgical History  Procedure Date  . Thyroid surgery 1982    tumor removed  . Cesarean section 1983  . Rotator cuff repair 11-17-11    left '05  . Kidney stone surgery 2000, 2009, 2010  . Laparoscopic assissted total colectomy w/ j-pouch 03/01/11  . Ileostomy closure 05/02/2011    ileosomy takedown  . Laparotomy 05/14/2011    small bowel resection, ileostomy  . Abdominal hysterectomy 1986    fibroids  . Eye surgery     lasik  bilaterally  . Appendectomy   . Colon surgery 12/12    ileostomy   . Ileo loop colostomy closure 01/18/2012    Procedure: LAPAROSCOPIC ILEO LOOP COLOSTOMY CLOSURE;  Surgeon: Adolph Pollack, MD;  Location: [facility];  Service: General;  Laterality: N/A;   Laparoscopic Ileostomy Closure , lysis of adhesions     Family History  Problem Relation Age of Onset  . Arthritis Other   . Mental illness Other   . COPD Mother   . Depression Mother   . Deep vein thrombosis Father   . Depression Father     History  Substance Use Topics  . Smoking status: Former Smoker -- 1.0 packs/day for 10 years    Types: Cigarettes    Quit date: 08/02/1980  . Smokeless tobacco: Never Used  . Alcohol Use: No    Review of Systems ROS: Statement: All systems negative except as marked or noted in the HPI; Constitutional: Negative for fever and chills. ; ; Eyes: Negative for eye pain, redness and discharge. ; ; ENMT: Negative for ear pain, hoarseness, nasal congestion, sinus pressure and sore throat. ; ; Cardiovascular: Negative for chest pain, palpitations, diaphoresis, dyspnea and peripheral edema. ; ; Respiratory: Negative for cough, wheezing and stridor. ; ; Gastrointestinal: +Nausea, abd pain, decreased stool output. Negative for vomiting, blood in stool, hematemesis, jaundice and rectal bleeding. . ; ; Genitourinary: Negative for dysuria, flank pain and hematuria. ; ; Musculoskeletal: Negative for back pain and neck pain. Negative for swelling and trauma.; ; Skin: Negative for pruritus, rash, abrasions, blisters,  bruising and skin lesion.; ; Neuro: Negative for headache, lightheadedness and neck stiffness. Negative for weakness, altered level of consciousness , altered mental status, extremity weakness, paresthesias, involuntary movement, seizure and syncope.       Allergies  Modafinil  Home Medications   Current Outpatient Rx  Name  Route  Sig  Dispense  Refill  . FERROUS GLUCONATE 225 (27 FE) MG PO TABS   Oral   Take 240 mg by mouth daily.         . IBUPROFEN 200 MG PO TABS   Oral   Take 800 mg by mouth every 6 (six) hours as needed. pain         . LEVOTHYROXINE SODIUM 150 MCG PO TABS   Oral   Take 150 mcg by mouth daily before breakfast.  Prescribe non-generic.         Marland Kitchen LOSARTAN POTASSIUM 50 MG PO TABS   Oral   Take 50 mg by mouth daily before breakfast.         . MELOXICAM 15 MG PO TABS   Oral   Take 15 mg by mouth daily.         Marland Kitchen MONTELUKAST SODIUM 10 MG PO TABS   Oral   Take 10 mg by mouth at bedtime.          Marland Kitchen ONE-DAILY MULTI VITAMINS PO TABS   Oral   Take 1 tablet by mouth daily.         . SERTRALINE HCL 100 MG PO TABS   Oral   Take 150 mg by mouth daily. Takes along with 50 mg tablet to equal 150 mg at night         . SERTRALINE HCL 50 MG PO TABS   Oral   Take 150 mg by mouth daily. Takes with 100 mg tablet to equal 150 mg at night         . SYNTHROID 175 MCG PO TABS   Oral   Take 1 tablet (175 mcg total) by mouth daily.   30 tablet   2     Dispense as written.   Marland Kitchen VITAMIN A 8000 UNITS PO CAPS   Oral   Take 8,000 Units by mouth daily.         Marland Kitchen ZINC GLUCONATE 50 MG PO TABS   Oral   Take 50 mg by mouth daily.         Marland Kitchen ZOLPIDEM TARTRATE 10 MG PO TABS      take 1 tablet by mouth at bedtime if needed   30 tablet   5     BP 155/91  Pulse 93  Temp 98.4 F (36.9 C) (Oral)  Resp 20  SpO2 98%  Physical Exam 2145: Physical examination:  Nursing notes reviewed; Vital signs and O2 SAT reviewed;  Constitutional: Well developed, Well nourished, Well hydrated, Uncomfortable appearing; Head:  Normocephalic, atraumatic; Eyes: EOMI, PERRL, No scleral icterus; ENMT: Mouth and pharynx normal, Mucous membranes moist; Neck: Supple, Full range of motion, No lymphadenopathy; Cardiovascular: Regular rate and rhythm, No gallop; Respiratory: Breath sounds clear & equal bilaterally, No rales, rhonchi, wheezes.  Speaking full sentences with ease, Normal respiratory effort/excursion; Chest: Nontender, Movement normal; Abdomen: Softly distended, +diffusely tender to palp. +soft ventral hernia is reducible without overlying erythema or ecchymosis. Decreased bowel sounds; Genitourinary: No CVA  tenderness; Extremities: Pulses normal, No tenderness, No edema, No calf edema or asymmetry.; Neuro: AA&Ox3, Major CN grossly intact.  Speech clear. No gross  focal motor or sensory deficits in extremities.; Skin: Color normal, Warm, Dry.   ED Course  Procedures     MDM  MDM Reviewed: previous chart, nursing note and vitals Interpretation: labs, x-ray and CT scan   Results for orders placed during the hospital encounter of 07/04/12  CBC WITH DIFFERENTIAL      Component Value Range   WBC 10.8 (*) 4.0 - 10.5 K/uL   RBC 4.19  3.87 - 5.11 MIL/uL   Hemoglobin 12.4  12.0 - 15.0 g/dL   HCT 16.1  09.6 - 04.5 %   MCV 89.0  78.0 - 100.0 fL   MCH 29.6  26.0 - 34.0 pg   MCHC 33.2  30.0 - 36.0 g/dL   RDW 40.9  81.1 - 91.4 %   Platelets 237  150 - 400 K/uL   Neutrophils Relative 76  43 - 77 %   Neutro Abs 8.2 (*) 1.7 - 7.7 K/uL   Lymphocytes Relative 17  12 - 46 %   Lymphs Abs 1.9  0.7 - 4.0 K/uL   Monocytes Relative 5  3 - 12 %   Monocytes Absolute 0.5  0.1 - 1.0 K/uL   Eosinophils Relative 2  0 - 5 %   Eosinophils Absolute 0.2  0.0 - 0.7 K/uL   Basophils Relative 0  0 - 1 %   Basophils Absolute 0.0  0.0 - 0.1 K/uL  COMPREHENSIVE METABOLIC PANEL      Component Value Range   Sodium 140  135 - 145 mEq/L   Potassium 4.3  3.5 - 5.1 mEq/L   Chloride 99  96 - 112 mEq/L   CO2 26  19 - 32 mEq/L   Glucose, Bld 112 (*) 70 - 99 mg/dL   BUN 17  6 - 23 mg/dL   Creatinine, Ser 7.82  0.50 - 1.10 mg/dL   Calcium 95.6 (*) 8.4 - 10.5 mg/dL   Total Protein 8.3  6.0 - 8.3 g/dL   Albumin 4.5  3.5 - 5.2 g/dL   AST 30  0 - 37 U/L   ALT 31  0 - 35 U/L   Alkaline Phosphatase 67  39 - 117 U/L   Total Bilirubin 0.4  0.3 - 1.2 mg/dL   GFR calc non Af Amer 70 (*) >90 mL/min   GFR calc Af Amer 81 (*) >90 mL/min  LIPASE, BLOOD      Component Value Range   Lipase 29  11 - 59 U/L  URINALYSIS, ROUTINE W REFLEX MICROSCOPIC      Component Value Range   Color, Urine YELLOW  YELLOW   APPearance CLEAR  CLEAR    Specific Gravity, Urine 1.019  1.005 - 1.030   pH 5.5  5.0 - 8.0   Glucose, UA NEGATIVE  NEGATIVE mg/dL   Hgb urine dipstick MODERATE (*) NEGATIVE   Bilirubin Urine NEGATIVE  NEGATIVE   Ketones, ur NEGATIVE  NEGATIVE mg/dL   Protein, ur NEGATIVE  NEGATIVE mg/dL   Urobilinogen, UA 0.2  0.0 - 1.0 mg/dL   Nitrite POSITIVE (*) NEGATIVE   Leukocytes, UA SMALL (*) NEGATIVE  LACTIC ACID, PLASMA      Component Value Range   Lactic Acid, Venous 2.7 (*) 0.5 - 2.2 mmol/L  URINE MICROSCOPIC-ADD ON      Component Value Range   Squamous Epithelial / LPF RARE  RARE   WBC, UA 7-10  <3 WBC/hpf   RBC / HPF 3-6  <3 RBC/hpf   Bacteria, UA  MANY (*) RARE   Ct Abdomen Pelvis W Contrast 07/04/2012  *RADIOLOGY REPORT*  Clinical Data: Acute onset of severe abdominal pain.  Nausea. History of multiple prior abdominal surgeries.  CT ABDOMEN AND PELVIS WITH CONTRAST  Technique:  Multidetector CT imaging of the abdomen and pelvis was performed following the standard protocol during bolus administration of intravenous contrast.  Contrast: 25mL OMNIPAQUE IOHEXOL 300 MG/ML  SOLN, OMNIPAQUE IOHEXOL 300 MG/ML  SOLN  Comparison: 11/07/2011  Findings: Focal scarring or pleural thickening in the left lung base posteriorly is stable since previous study.  Lung bases are otherwise clear.  Multiple low attenuation lesions consistent with cysts demonstrated throughout the liver, with the largest in segment four measuring 2.2 cm diameter. These are stable since previous study.  The spleen, gallbladder, pancreas, adrenal glands, and retroperitoneal lymph nodes are unremarkable.  Mild calcification and tortuosity of the aorta without aneurysm.  The kidneys demonstrate symmetrical nephrograms without hydronephrosis.  There are multiple sub centimeter parenchymal cysts bilaterally without solid mass identified. Nonobstructing stones are demonstrated in the upper and lower pole of the right kidney, largest in the upper pole measuring 5  mm diameter.  Nonobstructing stone in the left renal pelvis measures 11 mm.  Additional punctate nonobstructing stones in the left kidney.  Kidneys appear stable since previous study.  No free air or free fluid in the abdomen.  Postoperative changes with apparent partial colectomy and ileocolonic anastomoses.  There is a broad-based umbilical hernia which was present previously but there is new herniation of small bowel loops into the area with scarring and infiltration in the mesenteric fat.  There is distension of herniated small bowel loops and proximal small bowel loops with small bowel feces sign.  The presumed:  The distal to the anastomoses is decompressed.  This is consistent with high-grade small bowel obstruction, possibly due to an anastomotic stricture or herniation.  Small interloop fluid collections are present.  The mesenteric and portal vessels appear patent without evidence of recurrent mesenteric thrombosis.  Pelvis:  There appears to be an additional rectosigmoid colonic anastomoses.  The bladder is decompressed.  No free or loculated pelvic fluid collections.  No significant pelvic lymphadenopathy. Degenerative changes in the spine.  IMPRESSION: There is evidence of high-grade small bowel obstruction at the level of a ileocolic anastomoses coexisting with herniation of small bowel into an umbilical hernia.  Cause could be an anastomotic stricture versus herniation.  Interloop fluid collections are demonstrated without bowel wall thickening.  No evidence of vascular thrombosis.   Original Report Authenticated By: Burman Nieves, M.D.     0010:  Pt more comfortable after pain meds, though still c/o pain and nausea.  Will re-dose.  Remains afebrile with stable VS.  Ventral hernia continues soft and reducible.  Dx and testing d/w pt and family.  Questions answered.  Verb understanding, agreeable to admit.  T/C to General Surgeon Dr. Michaell Cowing, case discussed, including:  HPI, pertinent PM/SHx, VS/PE,  dx testing, ED course and treatment:  Agreeable to admit, requests to place NTG, give IVF, pain meds, he will be in to eval pt for admit.           Laray Anger, DO 07/05/12 2118

## 2012-07-05 NOTE — Progress Notes (Signed)
Patient ID: [name], female   DOB: 09-11-1950, 62 y.o.   MRN: [id number]    Subjective: Pt feels a little better today after NGT placed.  No flatus.  Objective: Vital signs in last 24 hours: Temp:  [98.4 F (36.9 C)-99.3 F (37.4 C)] 98.6 F (37 C) (02/07 0550) Pulse Rate:  [85-97] 97  (02/07 0550) Resp:  [16-20] 16  (02/07 0550) BP: (126-155)/(80-91) 126/81 mmHg (02/07 0550) SpO2:  [94 %-98 %] 94 % (02/07 0550) Weight:  [184 lb (83.462 kg)] 184 lb (83.462 kg) (02/07 0442) Last BM Date: 07/03/12  Intake/Output from previous day: 02/06 0701 - 02/07 0700 In: 410.4 [I.V.:410.4] Out: 300 [Emesis/NG output:300] Intake/Output this shift:    PE: Abd: soft, some tenderness on left side of abdomen, +BS, NGT with frankly feculent output. Heart: regular Lungs: CTAB  Lab Results:   Basename 07/05/12 0400 07/04/12 2135  WBC 6.8 10.8*  HGB 11.1* 12.4  HCT 33.8* 37.3  PLT 215 237   BMET  Basename 07/05/12 0400 07/04/12 2135  NA 141 140  K 4.1 4.3  CL 103 99  CO2 26 26  GLUCOSE 141* 112*  BUN 18 17  CREATININE 0.82 0.89  CALCIUM 10.1 11.2*   PT/INR No results found for this basename: LABPROT:2,INR:2 in the last 72 hours CMP     Component Value Date/Time   NA 141 07/05/2012 0400   K 4.1 07/05/2012 0400   CL 103 07/05/2012 0400   CO2 26 07/05/2012 0400   GLUCOSE 141* 07/05/2012 0400   BUN 18 07/05/2012 0400   CREATININE 0.82 07/05/2012 0400   CALCIUM 10.1 07/05/2012 0400   CALCIUM 10.0 03/18/2012 1355   PROT 8.3 07/04/2012 2135   ALBUMIN 4.5 07/04/2012 2135   AST 30 07/04/2012 2135   ALT 31 07/04/2012 2135   ALKPHOS 67 07/04/2012 2135   BILITOT 0.4 07/04/2012 2135   GFRNONAA 77* 07/05/2012 0400   GFRAA 89* 07/05/2012 0400   Lipase     Component Value Date/Time   LIPASE 29 07/04/2012 2135       Studies/Results: Ct Abdomen Pelvis W Contrast  07/04/2012  *RADIOLOGY REPORT*  Clinical Data: Acute onset of severe abdominal pain.  Nausea. History of multiple prior abdominal surgeries.   CT ABDOMEN AND PELVIS WITH CONTRAST  Technique:  Multidetector CT imaging of the abdomen and pelvis was performed following the standard protocol during bolus administration of intravenous contrast.  Contrast: 25mL OMNIPAQUE IOHEXOL 300 MG/ML  SOLN, OMNIPAQUE IOHEXOL 300 MG/ML  SOLN  Comparison: 11/07/2011  Findings: Focal scarring or pleural thickening in the left lung base posteriorly is stable since previous study.  Lung bases are otherwise clear.  Multiple low attenuation lesions consistent with cysts demonstrated throughout the liver, with the largest in segment four measuring 2.2 cm diameter. These are stable since previous study.  The spleen, gallbladder, pancreas, adrenal glands, and retroperitoneal lymph nodes are unremarkable.  Mild calcification and tortuosity of the aorta without aneurysm.  The kidneys demonstrate symmetrical nephrograms without hydronephrosis.  There are multiple sub centimeter parenchymal cysts bilaterally without solid mass identified. Nonobstructing stones are demonstrated in the upper and lower pole of the right kidney, largest in the upper pole measuring 5 mm diameter.  Nonobstructing stone in the left renal pelvis measures 11 mm.  Additional punctate nonobstructing stones in the left kidney.  Kidneys appear stable since previous study.  No free air or free fluid in the abdomen.  Postoperative changes with apparent partial colectomy and ileocolonic  anastomoses.  There is a broad-based umbilical hernia which was present previously but there is new herniation of small bowel loops into the area with scarring and infiltration in the mesenteric fat.  There is distension of herniated small bowel loops and proximal small bowel loops with small bowel feces sign.  The presumed:  The distal to the anastomoses is decompressed.  This is consistent with high-grade small bowel obstruction, possibly due to an anastomotic stricture or herniation.  Small interloop fluid collections are  present.  The mesenteric and portal vessels appear patent without evidence of recurrent mesenteric thrombosis.  Pelvis:  There appears to be an additional rectosigmoid colonic anastomoses.  The bladder is decompressed.  No free or loculated pelvic fluid collections.  No significant pelvic lymphadenopathy. Degenerative changes in the spine.  IMPRESSION: There is evidence of high-grade small bowel obstruction at the level of a ileocolic anastomoses coexisting with herniation of small bowel into an umbilical hernia.  Cause could be an anastomotic stricture versus herniation.  Interloop fluid collections are demonstrated without bowel wall thickening.  No evidence of vascular thrombosis.   Original Report Authenticated By: Burman Nieves, M.D.    Dg Abd 2 Views  07/05/2012  *RADIOLOGY REPORT*  Clinical Data: Small bowel obstruction.  ABDOMEN - 2 VIEW  Comparison: CT scan dated 07/04/2012  Findings: NG tube has been inserted.  There are persistent distended loops of proximal small bowel.  Colon is not distended. Nondistended distal small bowel.  Bilateral renal calculi are noted.  No free air.  IMPRESSION: Persistent small bowel obstruction.   Original Report Authenticated By: Francene Boyers, M.D.     Anti-infectives: Anti-infectives    None       Assessment/Plan  1. Complex abdominal operations 2. 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 Cont NGT  2. Will get a barium enema today to better evaluate the anastomosis stricture. 3. Cont IVFs   LOS: 1 day    Zorian Gunderman E 07/05/2012, 9:12 AM Pager: [phone]

## 2012-07-06 MED ORDER — CHLORHEXIDINE GLUCONATE 0.12 % MT SOLN
OROMUCOSAL | Status: AC
  Administered 2012-07-06: 15 mL via OROMUCOSAL
  Filled 2012-07-06: qty 15

## 2012-07-06 NOTE — Progress Notes (Signed)
Patient ID: [name], female   DOB: Jun 10, 1950, 62 y.o.   MRN: [id number]  General Surgery - [facility] - Progress Note  HD# 3  Subjective: Patient much improved.  Ambulating.  Wants NG tube out.  Has had 4-5 bowel movements, passing flatus, no nausea or emesis.  Objective: Vital signs in last 24 hours: Temp:  [98.7 F (37.1 C)-99.2 F (37.3 C)] 99.2 F (37.3 C) (02/08 0510) Pulse Rate:  [96-107] 107 (02/08 0510) Resp:  [16] 16 (02/08 0510) BP: (130-137)/(82-83) 130/83 mmHg (02/08 0510) SpO2:  [92 %-95 %] 92 % (02/08 0510) Last BM Date: 07/05/12  Intake/Output from previous day: 02/07 0701 - 02/08 0700 In: 3000 [I.V.:3000] Out: 850 [Emesis/NG output:650; Stool:200]  Exam: HEENT - clear, not icteric Neck - soft Chest - clear bilaterally Cor - RRR, no murmur Abd - soft without distension; BS present; ventral hernia reducible; non-tender Ext - no significant edema Neuro - grossly intact, no focal deficits  Lab Results:   Recent Labs  07/04/12 2135 07/05/12 0400  WBC 10.8* 6.8  HGB 12.4 11.1*  HCT 37.3 33.8*  PLT 237 215     Recent Labs  07/04/12 2135 07/05/12 0400  NA 140 141  K 4.3 4.1  CL 99 103  CO2 26 26  GLUCOSE 112* 141*  BUN 17 18  CREATININE 0.89 0.82  CALCIUM 11.2* 10.1    Studies/Results: Ct Abdomen Pelvis W Contrast  07/04/2012  *RADIOLOGY REPORT*  Clinical Data: Acute onset of severe abdominal pain.  Nausea. History of multiple prior abdominal surgeries.  CT ABDOMEN AND PELVIS WITH CONTRAST  Technique:  Multidetector CT imaging of the abdomen and pelvis was performed following the standard protocol during bolus administration of intravenous contrast.  Contrast: 25mL OMNIPAQUE IOHEXOL 300 MG/ML  SOLN, OMNIPAQUE IOHEXOL 300 MG/ML  SOLN  Comparison: 11/07/2011  Findings: Focal scarring or pleural thickening in the left lung base posteriorly is stable since previous study.  Lung bases are otherwise clear.  Multiple  low attenuation lesions consistent with cysts demonstrated throughout the liver, with the largest in segment four measuring 2.2 cm diameter. These are stable since previous study.  The spleen, gallbladder, pancreas, adrenal glands, and retroperitoneal lymph nodes are unremarkable.  Mild calcification and tortuosity of the aorta without aneurysm.  The kidneys demonstrate symmetrical nephrograms without hydronephrosis.  There are multiple sub centimeter parenchymal cysts bilaterally without solid mass identified. Nonobstructing stones are demonstrated in the upper and lower pole of the right kidney, largest in the upper pole measuring 5 mm diameter.  Nonobstructing stone in the left renal pelvis measures 11 mm.  Additional punctate nonobstructing stones in the left kidney.  Kidneys appear stable since previous study.  No free air or free fluid in the abdomen.  Postoperative changes with apparent partial colectomy and ileocolonic anastomoses.  There is a broad-based umbilical hernia which was present previously but there is new herniation of small bowel loops into the area with scarring and infiltration in the mesenteric fat.  There is distension of herniated small bowel loops and proximal small bowel loops with small bowel feces sign.  The presumed:  The distal to the anastomoses is decompressed.  This is consistent with high-grade small bowel obstruction, possibly due to an anastomotic stricture or herniation.  Small interloop fluid collections are present.  The mesenteric and portal vessels appear patent without evidence of recurrent mesenteric thrombosis.  Pelvis:  There appears to be an additional rectosigmoid colonic anastomoses.  The bladder  is decompressed.  No free or loculated pelvic fluid collections.  No significant pelvic lymphadenopathy. Degenerative changes in the spine.  IMPRESSION: There is evidence of high-grade small bowel obstruction at the level of a ileocolic anastomoses coexisting with herniation  of small bowel into an umbilical hernia.  Cause could be an anastomotic stricture versus herniation.  Interloop fluid collections are demonstrated without bowel wall thickening.  No evidence of vascular thrombosis.   Original Report Authenticated By: Burman Nieves, M.D.    Dg Abd 2 Views  07/05/2012  *RADIOLOGY REPORT*  Clinical Data: Small bowel obstruction.  ABDOMEN - 2 VIEW  Comparison: CT scan dated 07/04/2012  Findings: NG tube has been inserted.  There are persistent distended loops of proximal small bowel.  Colon is not distended. Nondistended distal small bowel.  Bilateral renal calculi are noted.  No free air.  IMPRESSION: Persistent small bowel obstruction.   Original Report Authenticated By: Francene Boyers, M.D.     Assessment / Plan: 1.  Resolving SBO  Remove NG tube now  Begin clear liquid diet  Will advance diet tonight if clear liquids well tolerated  Ambulate in halls  Decrease IVF rate  Velora Heckler, MD, [facility] Office: [phone]  07/06/2012

## 2012-07-07 LAB — URINE CULTURE

## 2012-07-07 NOTE — Discharge Summary (Signed)
Physician Discharge Summary [facility]  Patient ID: [name] MRN: [id number] DOB/AGE: 04-Aug-1950 62 y.o.  Admit date: 07/04/2012 Discharge date: 07/07/2012  Admission Diagnoses:  Small bowel obstruction  Discharge Diagnoses:  Active Problems:   SBO (small bowel obstruction)   Fecal incontinence   Discharged Condition: good  Hospital Course: patient admitted from ER with signs and symptoms of SBO.  NG tube placed.  IVF administered.  Gradual improvement with resolution of symptoms.  BM on 07/05/2012.  NG removed and diet advanced on 07/06/2012.  Doing well with no complaints this morning.  Prepared for discharged.  Consults: None  Significant Diagnostic Studies: AXR, CT scan  Treatments: IV hydration, NG decompression  Discharge Exam: Blood pressure 134/84, pulse 83, temperature 98.9 F (37.2 C), temperature source Oral, resp. rate 16, height 5\' 8"  (1.727 m), weight 184 lb (83.462 kg), SpO2 96.00%. HEENT - clear Chest - clear Cor - RRR Abd - soft without distension; no tenderness; hernia soft and reducible  Disposition: Home with family  Discharge Orders   Future Orders Complete By Expires     Diet - low sodium heart healthy  As directed     Discharge instructions  As directed     Comments:      [facility] -- DISCHARGE INSTRUCTIONS  REMINDER:   Carry a list of your medications and allergies with you at all times  Call your pharmacy at least 1 week in advance to refill prescriptions  Do not mix any prescribed pain medicine with alcohol  Do not drive any motor vehicles while taking pain medication  Take medications with food unless otherwise directed  Follow-up appointments (date to return to physician): Please call [phone] to confirm your follow up appointment with your surgeon.  Call your Surgeon if you have:  Temperature greater than 101.0  Persistent nausea and vomiting  Severe uncontrolled pain  Redness,  tenderness, or signs of infection (pain, swelling, redness, odor or    green/yellow discharge around the site)  Difficulty breathing, headache or visual disturbances  Hives  Persistent dizziness or light-headedness  Any other questions or concerns you may have after discharge  In an emergency, call 911 or go to an Emergency Department at a nearby hospital.   Diet: Begin with liquids, and if they are tolerated, resume your usual diet.  Avoid spicy, greasy or heavy foods.  If you have nausea or vomiting, go back to liquids.  If you cannot keep liquids down, call your doctor.  Avoid alcohol consumption while on prescription pain medications. Good nutrition promotes healing. Increase fiber and fluids.   ADDITIONAL INSTRUCTIONS:   Velora Heckler, MD, [facility] Office: [phone]    Increase activity slowly  As directed     No wound care  As directed         Medication List    TAKE these medications       ferrous gluconate 225 (27 FE) MG tablet  Commonly known as:  FERGON  Take 240 mg by mouth daily.     ibuprofen 200 MG tablet  Commonly known as:  ADVIL,MOTRIN  Take 800 mg by mouth every 6 (six) hours as needed. pain     SYNTHROID 175 MCG tablet  Generic drug:  levothyroxine  Take 1 tablet (175 mcg total) by mouth daily.     levothyroxine 150 MCG tablet  Commonly known as:  SYNTHROID, LEVOTHROID  Take 150 mcg by mouth daily before breakfast.  Prescribe non-generic.     losartan 50 MG tablet  Commonly known as:  COZAAR  Take 50 mg by mouth daily before breakfast.     meloxicam 15 MG tablet  Commonly known as:  MOBIC  Take 15 mg by mouth daily.     multivitamin tablet  Take 1 tablet by mouth daily.     sertraline 100 MG tablet  Commonly known as:  ZOLOFT  Take 150 mg by mouth daily. Takes along with 50 mg tablet to equal 150 mg at night     sertraline 50 MG tablet  Commonly known as:  ZOLOFT  Take 150 mg by mouth daily. Takes with 100 mg  tablet to equal 150 mg at night     SINGULAIR 10 MG tablet  Generic drug:  montelukast  Take 10 mg by mouth at bedtime.     vitamin A 8000 UNIT capsule  Take 8,000 Units by mouth daily.     zinc gluconate 50 MG tablet  Take 50 mg by mouth daily.     zolpidem 10 MG tablet  Commonly known as:  AMBIEN  take 1 tablet by mouth at bedtime if needed         Velora Heckler, MD, [facility] Office: [phone]   Signed: Velora Heckler 07/07/2012, 9:44 AM

## 2012-07-07 NOTE — Progress Notes (Signed)
Patient given discharge instructions. Verbalized understanding of instructions, no prescriptions ordered by doctor. Is alert and oriented, states pain level zero. Iv site removed from left anticubital space, catheter tip intact, patient tolerated well. Will leave unit in wheelchair accompanied by staff.

## 2012-07-10 ENCOUNTER — Other Ambulatory Visit: Payer: Self-pay | Admitting: *Deleted

## 2012-07-10 MED ORDER — CEPHALEXIN 500 MG PO CAPS: 500.0000 mg | ORAL_CAPSULE | Freq: Three times a day (TID) | ORAL | Status: DC

## 2012-07-10 NOTE — Progress Notes (Signed)
Quick Note:  Pt informed, Rx will be called in as she has not been treated for UTI. ______

## 2012-07-16 ENCOUNTER — Telehealth: Payer: Self-pay | Admitting: Family Medicine

## 2012-07-16 NOTE — Telephone Encounter (Signed)
[facility] Triage Call Report Triage Record Num: [id number] Operator: Frederico Hamman Patient Name: [name] Call Date & Time: 07/15/2012 5:25:51PM Patient Phone: [phone] PCP: Evelena Peat Patient Gender: Female PCP Fax : [phone] Patient DOB: 10/21/1950 Practice Name: [facility] Reason for Call: PLEASE CALL [name] AT [phone]. [name] IS AWARE CALLBACK WILL NOT BE UNTIL TUESDAY 07/16/12 [name] states she has 2 abdominal hernias. Is planning a trip in May and will be flying on an airplane. Asking if flying is contraindicated or will it cause a problem due to the hernia's?office Protocol(s) Used: Office Note Recommended Outcome per Protocol: Information Noted and Sent to Office Reason for Outcome: Caller information to office Care Advice: ~ 02/17/

## 2012-07-16 NOTE — Telephone Encounter (Signed)
Unless the surgeon sees any problem, I would not see any contraindication to flying at that time.

## 2012-07-17 ENCOUNTER — Telehealth (INDEPENDENT_AMBULATORY_CARE_PROVIDER_SITE_OTHER): Payer: Self-pay | Admitting: *Deleted

## 2012-07-17 NOTE — Telephone Encounter (Signed)
Pt informed

## 2012-07-17 NOTE — Telephone Encounter (Signed)
Patient requested permission to fly on an airplane.  Spoke to [employer] MD who states he see's no contraindications to her flying.  Patient updated.

## 2012-07-29 ENCOUNTER — Other Ambulatory Visit: Payer: Self-pay

## 2012-07-29 DIAGNOSIS — Z1231 Encounter for screening mammogram for malignant neoplasm of breast: Secondary | ICD-10-CM

## 2012-08-02 ENCOUNTER — Other Ambulatory Visit: Payer: Self-pay | Admitting: Family Medicine

## 2012-08-13 ENCOUNTER — Other Ambulatory Visit (INDEPENDENT_AMBULATORY_CARE_PROVIDER_SITE_OTHER): Payer: BC Managed Care – PPO

## 2012-08-13 DIAGNOSIS — Z0000 Encounter for general adult medical examination without abnormal findings: Secondary | ICD-10-CM

## 2012-08-13 LAB — POCT URINALYSIS DIPSTICK
Bilirubin, UA: NEGATIVE
Glucose, UA: NEGATIVE
Ketones, UA: NEGATIVE
pH, UA: 5.5

## 2012-08-13 LAB — CBC WITH DIFFERENTIAL/PLATELET
Basophils Relative: 0.6 % (ref 0.0–3.0)
Eosinophils Absolute: 0.2 10*3/uL (ref 0.0–0.7)
Lymphs Abs: 2.2 10*3/uL (ref 0.7–4.0)
MCHC: 33.7 g/dL (ref 30.0–36.0)
MCV: 87.5 fl (ref 78.0–100.0)
Monocytes Absolute: 0.4 10*3/uL (ref 0.1–1.0)
Neutrophils Relative %: 51.7 % (ref 43.0–77.0)
Platelets: 210 10*3/uL (ref 150.0–400.0)
RBC: 3.64 Mil/uL — ABNORMAL LOW (ref 3.87–5.11)

## 2012-08-13 LAB — HEPATIC FUNCTION PANEL
Alkaline Phosphatase: 47 U/L (ref 39–117)
Bilirubin, Direct: 0 mg/dL (ref 0.0–0.3)
Total Bilirubin: 0.6 mg/dL (ref 0.3–1.2)
Total Protein: 7.1 g/dL (ref 6.0–8.3)

## 2012-08-13 LAB — BASIC METABOLIC PANEL
BUN: 20 mg/dL (ref 6–23)
CO2: 25 mEq/L (ref 19–32)
Chloride: 103 mEq/L (ref 96–112)
Creatinine, Ser: 1 mg/dL (ref 0.4–1.2)

## 2012-08-13 LAB — LIPID PANEL: Triglycerides: 361 mg/dL — ABNORMAL HIGH (ref 0.0–149.0)

## 2012-08-16 ENCOUNTER — Ambulatory Visit (INDEPENDENT_AMBULATORY_CARE_PROVIDER_SITE_OTHER): Payer: BC Managed Care – PPO | Admitting: Family Medicine

## 2012-08-16 ENCOUNTER — Encounter: Payer: Self-pay | Admitting: Family Medicine

## 2012-08-16 VITALS — BP 120/88 | Temp 98.6°F

## 2012-08-16 DIAGNOSIS — J069 Acute upper respiratory infection, unspecified: Secondary | ICD-10-CM

## 2012-08-16 MED ORDER — AZITHROMYCIN 250 MG PO TABS: ORAL_TABLET | ORAL | Status: DC

## 2012-08-16 NOTE — Progress Notes (Signed)
  Subjective:    Patient ID: [name], female    DOB: 1950/09/28, 62 y.o.   MRN: [id number]  HPI Patient seen with acute illness Onset over the week of sore throat, nasal congestion, malaise, and cough Cough is productive at times. Improved with Mucinex DM. No associated fever. Intermittent mild laryngitis symptoms. Patient is a nonsmoker. Actually quit 1982.   Review of Systems  Constitutional: Positive for fatigue. Negative for fever and chills.  HENT: Positive for congestion and sore throat. Negative for neck stiffness.   Respiratory: Positive for cough. Negative for wheezing.   Cardiovascular: Negative for chest pain.  Neurological: Negative for headaches.       Objective:   Physical Exam  Constitutional: She appears well-developed and well-nourished.  HENT:  Right Ear: External ear normal.  Left Ear: External ear normal.  Mouth/Throat: Oropharynx is clear and moist.  Neck: Neck supple.  Cardiovascular: Normal rate and regular rhythm.   Pulmonary/Chest: Effort normal and breath sounds normal. No respiratory distress. She has no wheezes. She has no rales.  Lymphadenopathy:    She has no cervical adenopathy.          Assessment & Plan:  URI. Suspect viral. At this point no antibiotics indicated. Followup promptly if she has any fever or worsening symptoms. Consider Zithromax if her productive cough worsens or persists into next week

## 2012-08-19 ENCOUNTER — Ambulatory Visit (INDEPENDENT_AMBULATORY_CARE_PROVIDER_SITE_OTHER): Payer: BC Managed Care – PPO | Admitting: Family Medicine

## 2012-08-19 ENCOUNTER — Encounter: Payer: Self-pay | Admitting: Family Medicine

## 2012-08-19 VITALS — BP 90/60 | HR 80 | Temp 98.1°F | Resp 12 | Ht 68.0 in | Wt 185.0 lb

## 2012-08-19 DIAGNOSIS — I10 Essential (primary) hypertension: Secondary | ICD-10-CM

## 2012-08-19 DIAGNOSIS — E039 Hypothyroidism, unspecified: Secondary | ICD-10-CM

## 2012-08-19 DIAGNOSIS — Z0000 Encounter for general adult medical examination without abnormal findings: Secondary | ICD-10-CM

## 2012-08-19 DIAGNOSIS — E8881 Metabolic syndrome: Secondary | ICD-10-CM

## 2012-08-19 DIAGNOSIS — D649 Anemia, unspecified: Secondary | ICD-10-CM

## 2012-08-19 DIAGNOSIS — R319 Hematuria, unspecified: Secondary | ICD-10-CM

## 2012-08-19 DIAGNOSIS — E785 Hyperlipidemia, unspecified: Secondary | ICD-10-CM

## 2012-08-19 LAB — IRON: Iron: 57 ug/dL (ref 42–145)

## 2012-08-19 LAB — POCT URINALYSIS DIPSTICK
Bilirubin, UA: NEGATIVE
Ketones, UA: NEGATIVE
Spec Grav, UA: >=1.03

## 2012-08-19 LAB — VITAMIN B12: Vitamin B-12: 516 pg/mL (ref 211–911)

## 2012-08-19 LAB — FERRITIN: Ferritin: 111.7 ng/mL (ref 10.0–291.0)

## 2012-08-19 LAB — HEMOGLOBIN A1C: Hgb A1c MFr Bld: 5.6 % (ref 4.6–6.5)

## 2012-08-19 NOTE — Patient Instructions (Signed)
Hyperglycemia Hyperglycemia occurs when the glucose (sugar) in your blood is too high. Hyperglycemia can happen for many reasons, but it most often happens to people who do not know they have diabetes or are not managing their diabetes properly.  CAUSES  Whether you have diabetes or not, there are other causes of hyperglycemia. Hyperglycemia can occur when you have diabetes, but it can also occur in other situations that you might not be as aware of, such as: Diabetes  If you have diabetes and are having problems controlling your blood glucose, hyperglycemia could occur because of some of the following reasons:  Not following your meal plan.  Not taking your diabetes medications or not taking it properly.  Exercising less or doing less activity than you normally do.  Being sick. Pre-diabetes  This cannot be ignored. Before people develop Type 2 diabetes, they almost always have "pre-diabetes." This is when your blood glucose levels are higher than normal, but not yet high enough to be diagnosed as diabetes. Research has shown that some long-term damage to the body, especially the heart and circulatory system, may already be occurring during pre-diabetes. If you take action to manage your blood glucose when you have pre-diabetes, you may delay or prevent Type 2 diabetes from developing. Stress  If you have diabetes, you may be "diet" controlled or on oral medications or insulin to control your diabetes. However, you may find that your blood glucose is higher than usual in the hospital whether you have diabetes or not. This is often referred to as "stress hyperglycemia." Stress can elevate your blood glucose. This happens because of hormones put out by the body during times of stress. If stress has been the cause of your high blood glucose, it can be followed regularly by your caregiver. That way he/she can make sure your hyperglycemia does not continue to get worse or progress to  diabetes. Steroids  Steroids are medications that act on the infection fighting system (immune system) to block inflammation or infection. One side effect can be a rise in blood glucose. Most people can produce enough extra insulin to allow for this rise, but for those who cannot, steroids make blood glucose levels go even higher. It is not unusual for steroid treatments to "uncover" diabetes that is developing. It is not always possible to determine if the hyperglycemia will go away after the steroids are stopped. A special blood test called an A1c is sometimes done to determine if your blood glucose was elevated before the steroids were started. SYMPTOMS  Thirsty.  Frequent urination.  Dry mouth.  Blurred vision.  Tired or fatigue.  Weakness.  Sleepy.  Tingling in feet or leg. DIAGNOSIS  Diagnosis is made by monitoring blood glucose in one or all of the following ways:  A1c test. This is a chemical found in your blood.  Fingerstick blood glucose monitoring.  Laboratory results. TREATMENT  First, knowing the cause of the hyperglycemia is important before the hyperglycemia can be treated. Treatment may include, but is not be limited to:  Education.  Change or adjustment in medications.  Change or adjustment in meal plan.  Treatment for an illness, infection, etc.  More frequent blood glucose monitoring.  Change in exercise plan.  Decreasing or stopping steroids.  Lifestyle changes. HOME CARE INSTRUCTIONS   Test your blood glucose as directed.  Exercise regularly. Your caregiver will give you instructions about exercise. Pre-diabetes or diabetes which comes on with stress is helped by exercising.  Eat wholesome,   balanced meals. Eat often and at regular, fixed times. Your caregiver or nutritionist will give you a meal plan to guide your sugar intake.  Being at an ideal weight is important. If needed, losing as little as 10 to 15 pounds may help improve blood  glucose levels. SEEK MEDICAL CARE IF:   You have questions about medicine, activity, or diet.  You continue to have symptoms (problems such as increased thirst, urination, or weight gain). SEEK IMMEDIATE MEDICAL CARE IF:   You are vomiting or have diarrhea.  Your breath smells fruity.  You are breathing faster or slower.  You are very sleepy or incoherent.  You have numbness, tingling, or pain in your feet or hands.  You have chest pain.  Your symptoms get worse even though you have been following your caregiver's orders.  If you have any other questions or concerns. Document Released: 11/08/2000 Document Revised: 08/07/2011 Document Reviewed: 09/11/2011 [facility] Patient Information 2013 [place], [place]. Metabolic Syndrome, Adult Metabolic syndrome descibes a group of risk factors for heart disease and diabetes. This syndrome has other names including Insulin Resistance Syndrome. The more risk factors you have, the higher your risk of having a heart attack, stroke, or developing diabetes. These risk factors include:  High blood sugar.  High blood triglyceride (a fat found in the blood) level.  High blood pressure.  Abdominal obesity (your extra weight is around your waist instead of your hips).  Low levels of high-density lipoprotein, HDL (good blood cholesterol). If you have any three of these risk factors, you have metabolic syndrome. If you have even one of these factors, you should make lifestyle changes to improve your health in order to prevent serious health diseases.  In people with metabolic syndrome, the cells do not respond properly to insulin. This can lead to high levels of glucose in the blood, which can interfere with normal body processes. Eventually, this can cause high blood pressure and higher fat levels in the blood, and inflammation of your blood vessels. The result can be heart disease and stroke.  CAUSES   Eating a diet rich in calories and saturated  fat.  Too little physical activity.  Being overweight. Other underlying causes are:  Family history (genetics).  Ethnicity (South Asians are at a higher risk).  Older age (your chances of developing metabolic syndrome are higher as you grow older).  Insulin resistance. SYMPTOMS  By itself, metabolic syndrome has no symptoms. However, you might have symptoms of diabetes (high blood sugar) or high blood pressure, such as:  Increased thirst, urination, and tiredness.  Dizzy spells.  Dull headaches that are unusual for you.  Blurred vision.  Nosebleeds. DIAGNOSIS  Your caregiver may make a diagnosis of metabolic syndrome if you have at least three of these factors:  If you are overweight mostly around the waist. This means a waistline greater than 40" in men and more than 35" in women. The waistline limits are 31 to 35 inches for women and 37 to 39 inches for men. In those who have certain genetic risk factors, such as having a family history of diabetes or being of Asian descent.  If you have a blood pressure of 130/85 mm Hg or more, or if you are being treated for high blood pressure.  If your blood triglyceride level is 150 mg/dL or more, or you are being treated for high levels of triglyceride.  If the level of HDL in your blood is below 40 mg/dL in men, less than  50 mg/dL in women, or you are receiving treatment for low levels of HDL.  If the level of sugar in your blood is high with fasting blood sugar level of 110 mg/dL or more, or you are under treatment for diabetes. TREATMENT  Your caregiver may have you make lifestyle changes, which may include:  Exercise.  Losing weight.  Maintaining a healthy diet.  Quitting smoking. The lifestyle changes listed above are key in reducing your risk for heart disease and stroke. Medicines may also be prescribed to help your body respond to insulin better and to reduce your blood pressure and blood fat levels. Aspirin may be  recommended to reduce risks of heart disease or stroke.  HOME CARE INSTRUCTIONS   Exercise.  Measure your waist at regular intervals just above the hipbones after you have breathed out.  Maintain a healthy diet.  Eat fruits, such as apples, oranges, and pears.  Eat vegetables.  Eat legumes, such as kidney beans, peas, and lentils.  Eat food rich in soluble fiber, such as whole grain cereal, oatmeal, and oat bran.  Use olive or safflower oils and avoid saturated fats.  Eat nuts.  Limit the amount of salt you eat or add to food.  Limit the amount of alcohol you drink.  Include fish in your diet, if possible.  Stop smoking if you are a smoker.  Maintain regular follow-up appointments.  Follow your caregiver's advice. SEEK MEDICAL CARE IF:   You feel very tired or fatigued.  You develop excessive thirst.  You pass large quantities of urine.  You are putting on weight around your waist rather than losing weight.  You develop headaches over and over again.  You have off-and-on dizzy spells. SEEK IMMEDIATE MEDICAL CARE IF:   You develop nosebleeds.  You develop sudden blurred vision.  You develop sudden dizzy spells.  You develop chest pains, trouble breathing, or feel an abnormal or irregular heart beat.  You have a fainting episode.  You develop any sudden trouble speaking and/or swallowing.  You develop sudden weakness in one arm and/or one leg. MAKE SURE YOU:   Understand these instructions.  Will watch your condition.  Will get help right away if you are not doing well or get worse. Document Released: 08/22/2007 Document Revised: 08/07/2011 Document Reviewed: 08/22/2007 [facility] Patient Information 2013 [place], [place].

## 2012-08-19 NOTE — Progress Notes (Signed)
Subjective:    Patient ID: [name], female    DOB: 1950-08-05, 62 y.o.   MRN: [id number]  HPI  Patient here for complete physical She has past medical history significant for hypertension, hyperlipidemia, hypothyroidism, kidney stones, diverticulitis. She's had total colectomy and had multiple complications regarding her colon surgery but things have been stable for several months now. She has past history of depression which is currently stable. Remains on sertraline 150 mg daily.  Major complaint is increased fatigue. Not exercising regularly. Appetite is fair. She's gained weight back significantly since her surgery. Chronic insomnia on Ambien.  Mood stable.  Immunizations are up to date. She will not need further colonoscopies because of total colectomy Schedule for repeat mammogram in April. Previous hysterectomy so no indication for Pap smears  Past Medical History  Diagnosis Date  . HYPOTHYROIDISM 10/06/2008  . HYPERLIPIDEMIA 10/06/2008  . DEPRESSION 02/23/2010  . ALLERGIC RHINITIS 07/23/2009  . Diverticulosis of colon 11/10/2008  . Asthma 11-17-11    no flareups in adult yrs. Albuterol used rarely  . 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 Coumadin-all resolved. no coumadin since 11/09/11  . Chronic kidney disease 11-17-11    hx. past multiple kidney stones, prev. lithos  . Anemia   . Arthritis     osteoarthritis  . Pneumonia 11-17-11    1'13 postop pneumonia.   Marland Kitchen HYPERTENSION 10/06/2008    EKG 12/12 EPIC  . 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 Hospital-acquired pneumonia 05/28/2011   Past Surgical History  Procedure Laterality Date  . Thyroid surgery  1982    tumor removed  . Cesarean section  1983  . Rotator cuff repair  11-17-11    left '05  . Kidney stone surgery  2000, 2009, 2010  . Laparoscopic assissted total colectomy w/ j-pouch  03/01/11  . Ileostomy closure  05/02/2011    ileosomy  takedown  . Laparotomy  05/14/2011    small bowel resection, ileostomy  . Abdominal hysterectomy  1986    fibroids  . Eye surgery      lasik  bilaterally  . Appendectomy    . Colon surgery  12/12    ileostomy   . Ileo loop colostomy closure  01/18/2012    Procedure: LAPAROSCOPIC ILEO LOOP COLOSTOMY CLOSURE;  Surgeon: Adolph Pollack, MD;  Location: [facility];  Service: General;  Laterality: N/A;  Laparoscopic Ileostomy Closure , lysis of adhesions     reports that she quit smoking about 32 years ago. Her smoking use included Cigarettes. She has a 10 pack-year smoking history. She has never used smokeless tobacco. She reports that she does not drink alcohol or use illicit drugs. family history includes Arthritis in her other; COPD in her mother; Deep vein thrombosis in her father; Depression in her father and mother; and Mental illness in her other. Allergies  Allergen Reactions  . Modafinil Rash    Under breasts only.      Review of Systems  Constitutional: Positive for fatigue. Negative for fever, activity change, appetite change and unexpected weight change.  HENT: Negative for hearing loss, ear pain, sore throat and trouble swallowing.   Eyes: Negative for visual disturbance.  Respiratory: Negative for cough and shortness of breath.   Cardiovascular: Negative for chest pain and palpitations.  Gastrointestinal: Negative for abdominal pain, diarrhea, constipation and blood in stool.  Endocrine: Negative for cold intolerance and heat intolerance.  Genitourinary: Negative for dysuria  and hematuria.  Musculoskeletal: Negative for myalgias, back pain and arthralgias.  Skin: Negative for rash.  Neurological: Negative for dizziness, syncope, weakness and headaches.  Hematological: Negative for adenopathy.  Psychiatric/Behavioral: Negative for confusion and dysphoric mood.       Objective:   Physical Exam  Constitutional: She is oriented to person, place, and time. She appears  well-developed and well-nourished.  HENT:  Head: Normocephalic and atraumatic.  Eyes: EOM are normal. Pupils are equal, round, and reactive to light.  Neck: Normal range of motion. Neck supple. No thyromegaly present.  Cardiovascular: Normal rate, regular rhythm and normal heart sounds.   No murmur heard. Pulmonary/Chest: Breath sounds normal. No respiratory distress. She has no wheezes. She has no rales.  Abdominal: Soft. Bowel sounds are normal. She exhibits mass. She exhibits no distension. There is no tenderness. There is no rebound and no guarding.  Patient has a couple of soft abdominal wall hernias which are nontender  Musculoskeletal: Normal range of motion. She exhibits no edema.  Lymphadenopathy:    She has no cervical adenopathy.  Neurological: She is alert and oriented to person, place, and time. She displays normal reflexes. No cranial nerve deficit.  Skin: No rash noted.  Psychiatric: She has a normal mood and affect. Her behavior is normal. Judgment and thought content normal.          Assessment & Plan:  #1 health maintenance. Continue yearly mammograms. No indication for Pap smear. No further colonoscopies (total colectomy). Labs reviewed. Multiple abnormalities which are addressed as below #2 hypothyroidism which is adequately treated #3 fatigue. Likely multifactorial. Possibly related to high dose serotonin reuptake inhibitor, anemia, lack of exercise #4 metabolic syndrome. Discussed significance. Lose weight and establish and more consistent exercise #5 normocytic anemia. Check serum iron, ferritin, and B12 #6 hematuria. Check repeat urine dipstick. Urine heme positive. Probably related to kidney stones but cannot confirm. #7 dyslipidemia. Discussed possible statins but at this point she wishes to work on weight loss and repeat in 3-4 months #8 hyperglycemia. Discussed lifestyle management. Check baseline A1c.

## 2012-08-20 NOTE — Progress Notes (Signed)
Quick Note:  Called and spoke with pt and pt is aware. ______ 

## 2012-08-26 ENCOUNTER — Ambulatory Visit
Admission: RE | Admit: 2012-08-26 | Discharge: 2012-08-26 | Disposition: A | Discharge status: Home / Self Care | Payer: BC Managed Care – PPO | Source: Ambulatory Visit

## 2012-08-26 DIAGNOSIS — Z1231 Encounter for screening mammogram for malignant neoplasm of breast: Secondary | ICD-10-CM

## 2012-09-03 ENCOUNTER — Other Ambulatory Visit: Payer: Self-pay | Admitting: Family Medicine

## 2012-09-05 ENCOUNTER — Telehealth: Payer: Self-pay | Admitting: Family Medicine

## 2012-09-05 NOTE — Telephone Encounter (Signed)
This pt is involved in case management w/ [employer]. They are there to help if you need them. Courtesy call only.

## 2012-09-09 ENCOUNTER — Other Ambulatory Visit: Payer: Self-pay | Admitting: Urology

## 2012-09-09 ENCOUNTER — Encounter (HOSPITAL_COMMUNITY): Payer: Self-pay | Admitting: *Deleted

## 2012-09-09 NOTE — Pre-Procedure Instructions (Signed)
Asked to bring blue folder the day of the procedure,insurance card,I.D. driver's license,wear comfortable clothing and have a driver for the day. Asked not to take Advil,Motrin,Ibuprofen,Aleve or any NSAIDS, Aspirin, Aspirin products or Toradol for 72 hours prior to procedure,  No vitamins or herbal medications 7 days prior to procedure. Instructed to take laxative per doctor's office instructions and eat a light dinner the evening before procedure.   To arrive at 0645  for lithotripsy procedure.

## 2012-09-11 ENCOUNTER — Encounter (HOSPITAL_COMMUNITY): Payer: Self-pay | Admitting: Pharmacy Technician

## 2012-09-11 NOTE — H&P (Signed)
eason For Visit  [name] presents today with left flank pain   History of Present Illness      [name] has a history of nephrolithiasis.  She has passed many stones and had multiple stone procedures in her lifetime.         Interval history:  [name] presents today with left flank pain.  This has been present for two days.  Laying down makes this worse.  Sitting up and staying still makes it better.  It is associated with hematuria and nausea.  She denies fever.   Past Medical History Problems  1. History of  Anxiety (Symptom) 300.00 2. History of  Arthritis V13.4 3. History of  Asthma 493.90 4. History of  Colonic Diverticulosis 562.10 5. History of  Depression 311 6. History of  Heartburn 787.1 7. History of  Hydronephrosis Left 591 8. History of  Hypercholesterolemia 272.0 9. History of  Hypertension 401.9 10. History of  Hypothyroidism 244.9 11. History of  Lumbago 724.2 12. History of  Nephrolithiasis V13.01 13. History of  Nephrolithiasis Of The Right Kidney V13.01 14. History of  Proximal Ureteral Stone On The Left 592.1 15. History of  Restless Legs Syndrome 333.94 16. History of  Thyroid Cancer V10.87 17. Tobacco Use V15.82 18. History of  Ureteral Stone Left 592.1 19. History of  Urinary Tract Infection V13.02  Surgical History Problems  1. History of  Cesarean Section 2. History of  Cystoscopy With Insertion Of Ureteral Stent Left 3. History of  Cystoscopy With Insertion Of Ureteral Stent Left 4. History of  Cystoscopy With Ureteroscopy Left 5. History of  Hysterectomy 6. History of  Ileostomy Revision 7. History of  Lithotripsy 8. History of  Lithotripsy 9. History of  Lithotripsy 10. History of  Thyroid Surgery 11. History of  Total Abdominal Colectomy With Ileostomy  Current Meds 1. Ambien 10 MG Oral Tablet; Therapy: (Recorded:27Jun2008) to 2. Cozaar 50 MG Oral Tablet; Therapy: (Recorded:27Jun2008) to 3. Iron TABS; Therapy:  (Recorded:02Apr2014) to 4. Mobic 15 MG Oral Tablet; Therapy: (Recorded:27Jun2008) to 5. Oxycodone-Acetaminophen 5-325 MG Oral Tablet; TAKE 1 TO 2 TABLETS EVERY 4 HOURS AS  NEEDED FOR PAIN; Therapy: 14May2013 to (Evaluate:04Apr2014); Last Rx:02Apr2014 6. Proventil HFA 108 (90 Base) MCG/ACT Inhalation Aerosol Solution; Therapy: 23Dec2010 to 7. Sertraline HCl 100 MG Oral Tablet; Therapy: 09Aug2010 to 8. Singulair TABS; Therapy: (Recorded:26Apr2010) to 9. Synthroid 137 MCG Oral Tablet; Therapy: (Recorded:30Jul2013) to 10. Tamsulosin HCl 0.4 MG Oral Capsule; TAKE 1 CAPSULE BY MOUTH  DAILY; Therapy: 30Jul2013   to (Last Rx:30Jul2013)  Requested for: 30Jul2013 11. Vitamin A TABS; Therapy: (Recorded:02Apr2014) to 12. Vitamin D (Ergocalciferol) 50000 UNIT Oral Capsule; Therapy: 05Apr2013 to  Allergies Medication  1. Provigil TABS  Family History Problems  1. Family history of  Death In The Family Father 2. Family history of  Death In The Family Mother Deceased at age 38; lung disease 3. Family history of  Family Health Status Number Of Children 1 son  1 daughter 4. Maternal history of  Pulmonary Disease  Social History Problems  1. Caffeine Use 2 cups in the am 2. Marital History - Currently Married 3. 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 History of  Alcohol Use  Review of Systems Genitourinary, constitutional and gastrointestinal system(s) were reviewed and pertinent findings if present are noted.  Genitourinary: hematuria.  Gastrointestinal: flank pain.    Vitals Vital Signs [Data Includes: Last 1 Day]  10Apr2014 09:11AM  Blood Pressure: 118 /  76 Temperature: 98.3 F Heart Rate: 90  Physical Exam Constitutional: Well nourished and well developed . No acute distress.  Pulmonary: No respiratory distress and normal respiratory rhythm and effort.  Abdomen: The abdomen is soft and nontender. No right CVA tenderness and left CVA tenderness.  Neuro/Psych:. Mood  and affect are appropriate.    Results/Data Urine [Data Includes: Last 1 Day]   10Apr2014  COLOR YELLOW   APPEARANCE CLOUDY   SPECIFIC GRAVITY 1.025   pH 6.0   GLUCOSE NEG mg/dL  BILIRUBIN NEG   KETONE NEG mg/dL  BLOOD LARGE   PROTEIN 30 mg/dL  UROBILINOGEN 0.2 mg/dL  NITRITE NEG   LEUKOCYTE ESTERASE NEG   SQUAMOUS EPITHELIAL/HPF RARE   WBC 7-10 WBC/hpf  RBC TNTC RBC/hpf  BACTERIA FEW   CRYSTALS NONE SEEN   CASTS NONE SEEN    The following images/tracing/specimen were independently visualized: Marland Kitchen  KUB: large almost 1 cm stone seen at left UPJ, second large stone seen within right kidney almost at the right UPJ, several other small calcifications seen within either kidney, no obvious stones seen over the contour of either ureter, stable pelvic phleboliths renal u/s: I have reviewed the films are report.  There are no masses or lesions seen.  There are stable cysts seen within the right kidney.  There are multiple non obstructing stones seen within each kidney although there is a large stone almost at the UPJ of each kidney.  Minimal fullness seen bilaterally.  There are also cysts seen on the liver.  The bladder is relatively empty and without abnormalities. See study sheet for details and measurements.    Assessment Assessed  1. Nephrolithiasis 592.0   [name]'s left flank pain is likely due to the stone seen in the left kidney almost at the location of the left UPJ.  Today it is not obstructing, but I assume when she lays down or rolls over, the stone moves to an obstructing position which causes her quite a bit of pain.  This stone is large, and I do not think she would be able to pass this stone.  I think she is a candidate for lithotripsy, and we discussed the risks and benefits of this today.  I will consult with Dr. Annabell Howells, and if he agrees, she will proceed with lithotripsy.  She has pain and nausea medication if needed. She was going to follow up in May for a micro hematuria  modified work up.  At this time we have decided that is is likely not necessary and her micro hematuria is likely related to her stones.  She will keep this appointment with Dr. Annabell Howells in May however as it will serve as a good post op appointment.   Plan Health Maintenance (V70.0)  1. UA With REFLEX  Done: 10Apr2014 08:13AM Microscopic Hematuria (599.72)  2. Cysto Nephrolithiasis (592.0)  3. OxyCODONE HCl 20 MG Oral Tablet; TAKE 1 TABLET Every 4 hours PRN pain; Therapy:  19Aug2010 to (Last Rx:10Apr2014) 4. Promethazine HCl 25 MG Oral Tablet; TAKE 1 TABLET Every 4 hours PRN nausea & vomitting;  Therapy: 19Aug2010 to (Last Rx:10Apr2014) 5. KUB  Done: 10Apr2014 12:00AM 6. RENAL U/S COMPLETE  Done: 10Apr2014 12:00AM   1. Consult with Dr. Annabell Howells in regards to lithotrispy of left UPJ stone

## 2012-09-12 ENCOUNTER — Ambulatory Visit (HOSPITAL_COMMUNITY): Payer: BC Managed Care – PPO

## 2012-09-12 ENCOUNTER — Encounter (HOSPITAL_COMMUNITY): Payer: Self-pay | Admitting: *Deleted

## 2012-09-12 ENCOUNTER — Ambulatory Visit (HOSPITAL_COMMUNITY)
Admission: RE | Admit: 2012-09-12 | Discharge: 2012-09-12 | Disposition: A | Discharge status: Home / Self Care | Payer: BC Managed Care – PPO | Source: Ambulatory Visit | Attending: Urology | Admitting: Urology

## 2012-09-12 ENCOUNTER — Encounter (HOSPITAL_COMMUNITY)
Admission: RE | Disposition: A | Discharge status: Home / Self Care | Payer: Self-pay | Source: Ambulatory Visit | Attending: Urology

## 2012-09-12 DIAGNOSIS — Z87891 Personal history of nicotine dependence: Secondary | ICD-10-CM | POA: Insufficient documentation

## 2012-09-12 DIAGNOSIS — F411 Generalized anxiety disorder: Secondary | ICD-10-CM | POA: Insufficient documentation

## 2012-09-12 DIAGNOSIS — Z87440 Personal history of urinary (tract) infections: Secondary | ICD-10-CM | POA: Insufficient documentation

## 2012-09-12 DIAGNOSIS — Z87442 Personal history of urinary calculi: Secondary | ICD-10-CM | POA: Insufficient documentation

## 2012-09-12 DIAGNOSIS — E039 Hypothyroidism, unspecified: Secondary | ICD-10-CM | POA: Insufficient documentation

## 2012-09-12 DIAGNOSIS — G2581 Restless legs syndrome: Secondary | ICD-10-CM | POA: Insufficient documentation

## 2012-09-12 DIAGNOSIS — N1330 Unspecified hydronephrosis: Secondary | ICD-10-CM | POA: Insufficient documentation

## 2012-09-12 DIAGNOSIS — F3289 Other specified depressive episodes: Secondary | ICD-10-CM | POA: Insufficient documentation

## 2012-09-12 DIAGNOSIS — E7800 Pure hypercholesterolemia, unspecified: Secondary | ICD-10-CM | POA: Insufficient documentation

## 2012-09-12 DIAGNOSIS — Z79899 Other long term (current) drug therapy: Secondary | ICD-10-CM | POA: Insufficient documentation

## 2012-09-12 DIAGNOSIS — I10 Essential (primary) hypertension: Secondary | ICD-10-CM | POA: Insufficient documentation

## 2012-09-12 DIAGNOSIS — Z888 Allergy status to other drugs, medicaments and biological substances status: Secondary | ICD-10-CM | POA: Insufficient documentation

## 2012-09-12 DIAGNOSIS — Z791 Long term (current) use of non-steroidal anti-inflammatories (NSAID): Secondary | ICD-10-CM | POA: Insufficient documentation

## 2012-09-12 DIAGNOSIS — N200 Calculus of kidney: Secondary | ICD-10-CM | POA: Insufficient documentation

## 2012-09-12 DIAGNOSIS — J45909 Unspecified asthma, uncomplicated: Secondary | ICD-10-CM | POA: Insufficient documentation

## 2012-09-12 DIAGNOSIS — F329 Major depressive disorder, single episode, unspecified: Secondary | ICD-10-CM | POA: Insufficient documentation

## 2012-09-12 SURGERY — LITHOTRIPSY, ESWL
Anesthesia: LOCAL | Laterality: Left

## 2012-09-12 MED ORDER — SULFAMETHOXAZOLE-TRIMETHOPRIM 800-160 MG PO TABS: 1.0000 | ORAL_TABLET | Freq: Two times a day (BID) | ORAL | Status: DC

## 2012-09-12 MED ORDER — DIPHENHYDRAMINE HCL 25 MG PO CAPS
25.0000 mg | ORAL_CAPSULE | ORAL | Status: AC
  Administered 2012-09-12: 25 mg via ORAL
  Filled 2012-09-12: qty 1

## 2012-09-12 MED ORDER — DIAZEPAM 5 MG PO TABS
10.0000 mg | ORAL_TABLET | ORAL | Status: AC
  Administered 2012-09-12: 10 mg via ORAL
  Filled 2012-09-12: qty 2

## 2012-09-12 MED ORDER — OXYCODONE-ACETAMINOPHEN 5-325 MG PO TABS: 1.0000 | ORAL_TABLET | ORAL | Status: DC | PRN

## 2012-09-12 MED ORDER — DEXTROSE 5 % IV SOLN
1.0000 g | Freq: Once | INTRAVENOUS | Status: DC
  Filled 2012-09-12: qty 10

## 2012-09-12 MED ORDER — CIPROFLOXACIN HCL 500 MG PO TABS
500.0000 mg | ORAL_TABLET | ORAL | Status: AC
  Administered 2012-09-12: 500 mg via ORAL
  Filled 2012-09-12: qty 1

## 2012-09-12 MED ORDER — DEXTROSE-NACL 5-0.45 % IV SOLN
INTRAVENOUS | Status: DC
  Administered 2012-09-12 (×2): via INTRAVENOUS

## 2012-09-12 NOTE — Interval H&P Note (Signed)
History and Physical Interval Note:  09/12/2012 8:22 AM  [name]  has presented today for surgery, with the diagnosis of LEFT RENAL STONE   The various methods of treatment have been discussed with the patient and family. After consideration of risks, benefits and other options for treatment, the patient has consented to  Procedure(s): LEFT EXTRACORPOREAL SHOCK WAVE LITHOTRIPSY (ESWL) (Left) as a surgical intervention .  The patient's history has been reviewed, patient examined, no change in status, stable for surgery.  I have reviewed the patient's chart and labs.  Questions were answered to the patient's satisfaction.     Clorissa Gruenberg J

## 2012-09-20 ENCOUNTER — Encounter: Payer: Self-pay | Admitting: Family Medicine

## 2012-09-20 ENCOUNTER — Ambulatory Visit (INDEPENDENT_AMBULATORY_CARE_PROVIDER_SITE_OTHER): Payer: BC Managed Care – PPO | Admitting: Family Medicine

## 2012-09-20 VITALS — BP 110/80 | Temp 98.0°F

## 2012-09-20 DIAGNOSIS — B9789 Other viral agents as the cause of diseases classified elsewhere: Secondary | ICD-10-CM

## 2012-09-20 DIAGNOSIS — B349 Viral infection, unspecified: Secondary | ICD-10-CM

## 2012-09-20 MED ORDER — ALBUTEROL SULFATE HFA 108 (90 BASE) MCG/ACT IN AERS: 2.0000 | INHALATION_SPRAY | RESPIRATORY_TRACT | Status: AC | PRN

## 2012-09-20 NOTE — Progress Notes (Signed)
  Subjective:    Patient ID: [name], female    DOB: 1950/11/08, 62 y.o.   MRN: [id number]  HPI Patient is seen with acute illness. Onset couple days ago diffuse body aches, occasional cough, nasal congestion. No significant sore throat. No headaches. Generalized malaise.  Patient just finished course of antibiotic with Bactrim on 4-23 following recent surgery for kidney stone. No urinary symptoms at this time.  Past Medical History  Diagnosis Date  . HYPOTHYROIDISM 10/06/2008  . HYPERLIPIDEMIA 10/06/2008  . DEPRESSION 02/23/2010  . ALLERGIC RHINITIS 07/23/2009  . Diverticulosis of colon 11/10/2008  . 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 Coumadin-all resolved. no coumadin since 11/09/11  . Pneumonia 11-17-11    1'13 postop pneumonia.   Marland Kitchen HYPERTENSION 10/06/2008    EKG 12/12 EPIC  . 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 Hospital-acquired pneumonia 05/28/2011  . Asthma 11-17-11    no flareups in adult yrs. Albuterol used rarely  . Chronic kidney disease 11-17-11    hx. past multiple kidney stones, prev. lithos  . Arthritis     osteoarthritis  . Anemia     no recently   Past Surgical History  Procedure Laterality Date  . Thyroid surgery  1982    tumor removed  . Cesarean section  1983  . Rotator cuff repair  11-17-11    left '05  . Kidney stone surgery  2000, 2009, 2010  . Laparoscopic assissted total colectomy w/ j-pouch  03/01/11  . Ileostomy closure  05/02/2011    ileosomy takedown  . Laparotomy  05/14/2011    small bowel resection, ileostomy  . Abdominal hysterectomy  1986    fibroids  . Eye surgery      lasik  bilaterally  . Appendectomy    . Colon surgery  12/12    ileostomy   . Ileo loop colostomy closure  01/18/2012    Procedure: LAPAROSCOPIC ILEO LOOP COLOSTOMY CLOSURE;  Surgeon: Adolph Pollack, MD;  Location: [facility];  Service: General;  Laterality: N/A;  Laparoscopic Ileostomy Closure  , lysis of adhesions     reports that she quit smoking about 32 years ago. Her smoking use included Cigarettes. She has a 10 pack-year smoking history. She has never used smokeless tobacco. She reports that she does not drink alcohol or use illicit drugs. family history includes Arthritis in her other; COPD in her mother; Deep vein thrombosis in her father; Depression in her father and mother; and Mental illness in her other. Allergies  Allergen Reactions  . Modafinil Rash    Under breasts only.      Review of Systems  Constitutional: Positive for fatigue. Negative for fever and chills.  HENT: Positive for congestion.   Respiratory: Positive for cough.   Neurological: Negative for headaches.       Objective:   Physical Exam  Constitutional: She appears well-developed and well-nourished. No distress.  HENT:  Right Ear: External ear normal.  Left Ear: External ear normal.  Mouth/Throat: Oropharynx is clear and moist.  Neck: Neck supple.  Cardiovascular: Normal rate and regular rhythm.   Pulmonary/Chest: Effort normal and breath sounds normal. No respiratory distress. She has no wheezes. She has no rales.  Lymphadenopathy:    She has no cervical adenopathy.          Assessment & Plan:  Viral syndrome. Reassurance. Over-the-counter medications as needed. Refill albuterol for as needed use for wheezing or cough

## 2012-11-18 ENCOUNTER — Encounter: Payer: Self-pay | Admitting: Family Medicine

## 2012-11-18 ENCOUNTER — Ambulatory Visit (INDEPENDENT_AMBULATORY_CARE_PROVIDER_SITE_OTHER): Payer: BC Managed Care – PPO | Admitting: Family Medicine

## 2012-11-18 VITALS — BP 130/84 | HR 96 | Temp 98.6°F | Resp 16 | Wt 188.0 lb

## 2012-11-18 DIAGNOSIS — E039 Hypothyroidism, unspecified: Secondary | ICD-10-CM

## 2012-11-18 NOTE — Progress Notes (Signed)
  Subjective:    Patient ID: [name], female    DOB: 1950-12-03, 62 y.o.   MRN: [id number]  HPI Followup hypothyroidism She's been somewhat difficult to regulate. Most recent TSH was at goal. Compliant with therapy. No complaints at this time. Good appetite. Denies any issues with constipation or cold intolerance She has normocytic anemia evaluated last visit with normal ferritin, serum iron, and B12  Past Medical History  Diagnosis Date  . HYPOTHYROIDISM 10/06/2008  . HYPERLIPIDEMIA 10/06/2008  . DEPRESSION 02/23/2010  . ALLERGIC RHINITIS 07/23/2009  . Diverticulosis of colon 11/10/2008  . 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 Coumadin-all resolved. no coumadin since 11/09/11  . Pneumonia 11-17-11    1'13 postop pneumonia.   Marland Kitchen HYPERTENSION 10/06/2008    EKG 12/12 EPIC  . 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 Hospital-acquired pneumonia 05/28/2011  . Asthma 11-17-11    no flareups in adult yrs. Albuterol used rarely  . Chronic kidney disease 11-17-11    hx. past multiple kidney stones, prev. lithos  . Arthritis     osteoarthritis  . Anemia     no recently   Past Surgical History  Procedure Laterality Date  . Thyroid surgery  1982    tumor removed  . Cesarean section  1983  . Rotator cuff repair  11-17-11    left '05  . Kidney stone surgery  2000, 2009, 2010  . Laparoscopic assissted total colectomy w/ j-pouch  03/01/11  . Ileostomy closure  05/02/2011    ileosomy takedown  . Laparotomy  05/14/2011    small bowel resection, ileostomy  . Abdominal hysterectomy  1986    fibroids  . Eye surgery      lasik  bilaterally  . Appendectomy    . Colon surgery  12/12    ileostomy   . Ileo loop colostomy closure  01/18/2012    Procedure: LAPAROSCOPIC ILEO LOOP COLOSTOMY CLOSURE;  Surgeon: Adolph Pollack, MD;  Location: [facility];  Service: General;  Laterality: N/A;  Laparoscopic Ileostomy Closure , lysis of  adhesions     reports that she quit smoking about 32 years ago. Her smoking use included Cigarettes. She has a 10 pack-year smoking history. She has never used smokeless tobacco. She reports that she does not drink alcohol or use illicit drugs. family history includes Arthritis in her other; COPD in her mother; Deep vein thrombosis in her father; Depression in her father and mother; and Mental illness in her other. Allergies  Allergen Reactions  . Modafinil Rash    Under breasts only.      Review of Systems  Constitutional: Negative for appetite change and unexpected weight change.  Endocrine: Negative for cold intolerance, polydipsia and polyuria.       Objective:   Physical Exam  Constitutional: She appears well-developed and well-nourished.  Neck: Neck supple. No thyromegaly present.  Cardiovascular: Normal rate and regular rhythm.   Pulmonary/Chest: Effort normal and breath sounds normal. No respiratory distress. She has no wheezes. She has no rales.  Musculoskeletal: She exhibits no edema.          Assessment & Plan:  Hypothyroidism. Recheck TSH. If stable and at goal repeat followup in one year Dyslipidemia. Discussed importance of weight loss. Reassess at followup in 6 months History of high triglycerides. She will consider over-the-counter omega-3 supplement

## 2012-12-03 ENCOUNTER — Other Ambulatory Visit: Payer: Self-pay | Admitting: Family Medicine

## 2012-12-03 NOTE — Telephone Encounter (Signed)
Refill for 6 months. 

## 2012-12-29 ENCOUNTER — Other Ambulatory Visit: Payer: Self-pay | Admitting: Family Medicine

## 2012-12-29 IMAGING — CR DG CHEST 2V
2 series · 2 of 2 positions shown · non-contrast
Comparison: 07/09/2006

CLINICAL DATA: Preop colectomy.

CHEST - 2 VIEW

[w chest pa]
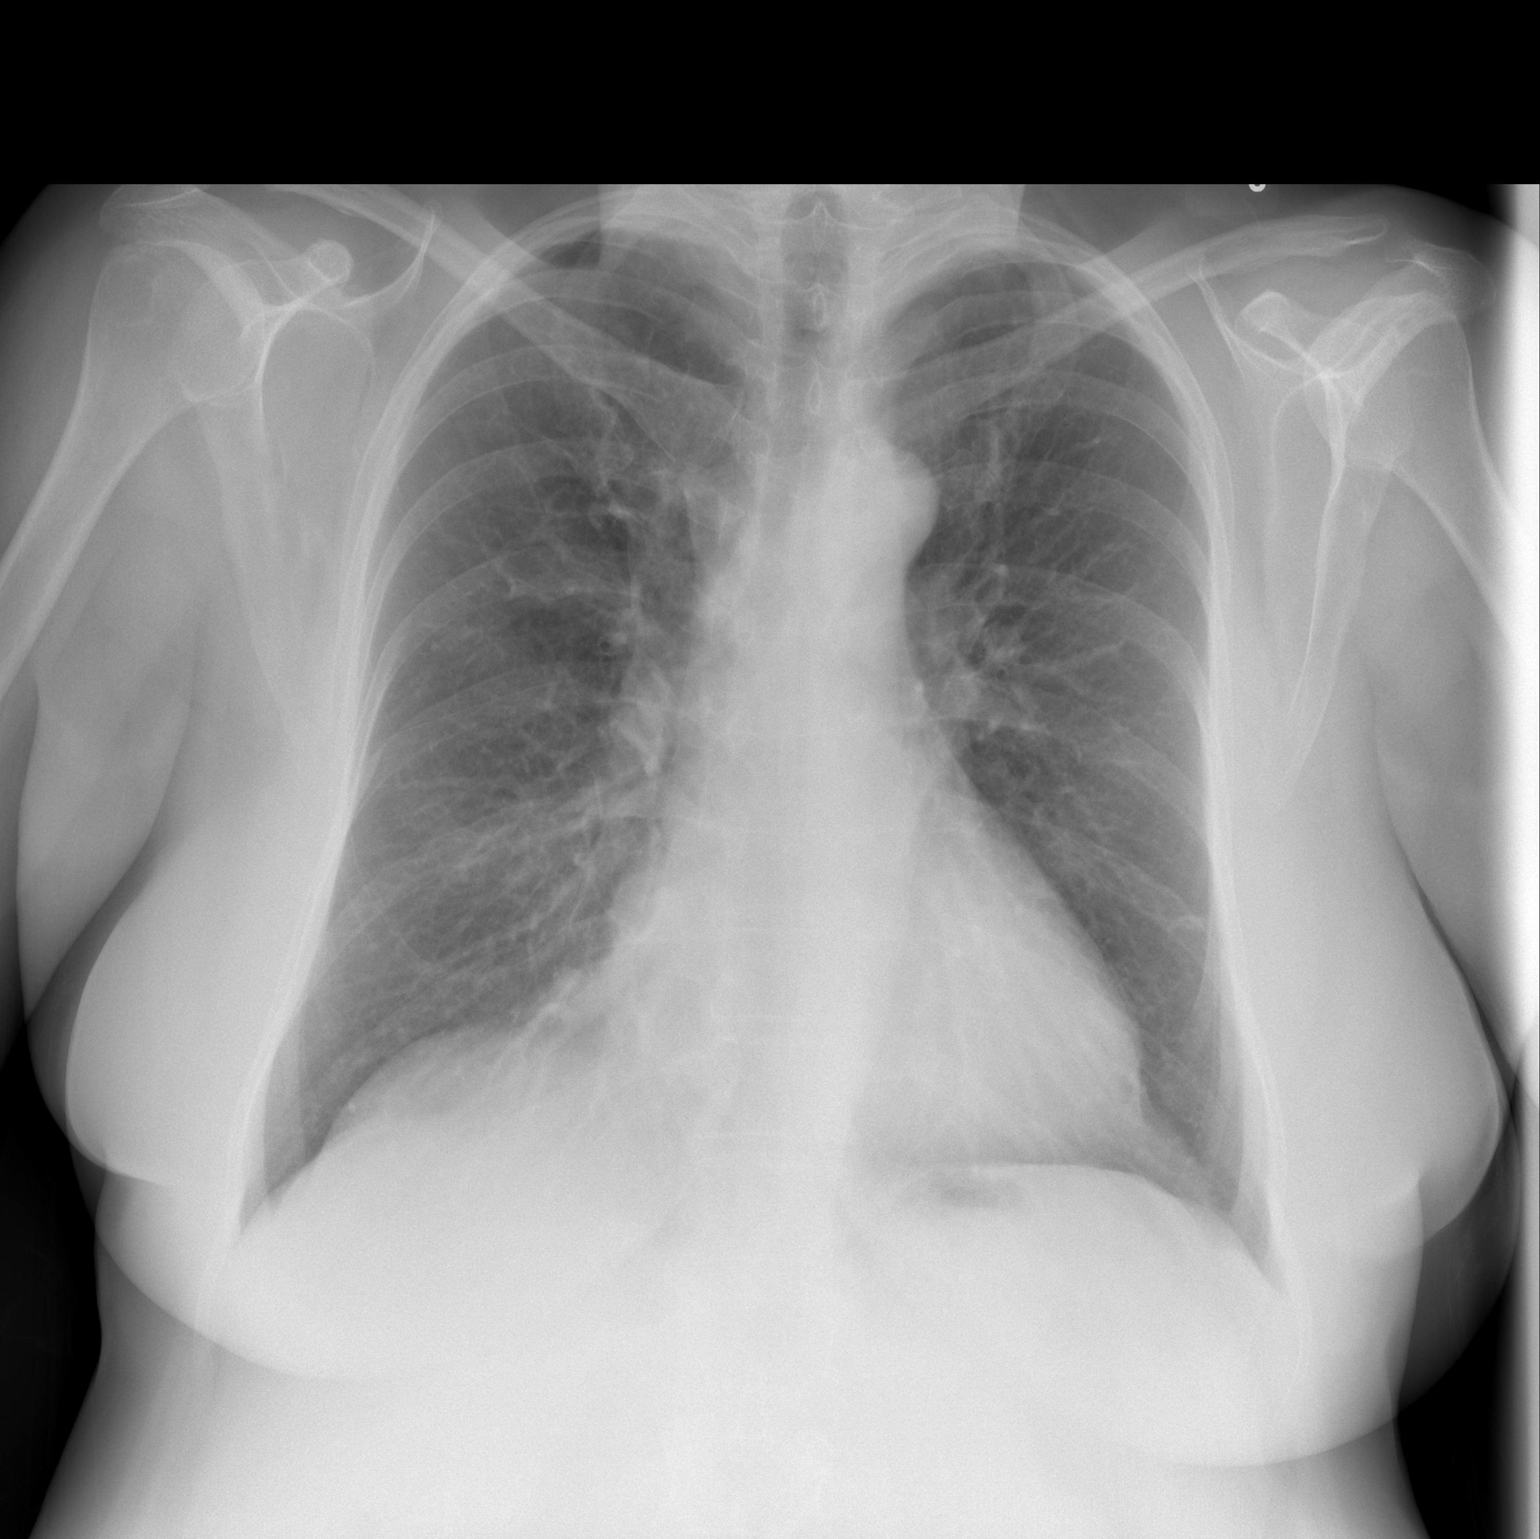

[w chest lat]
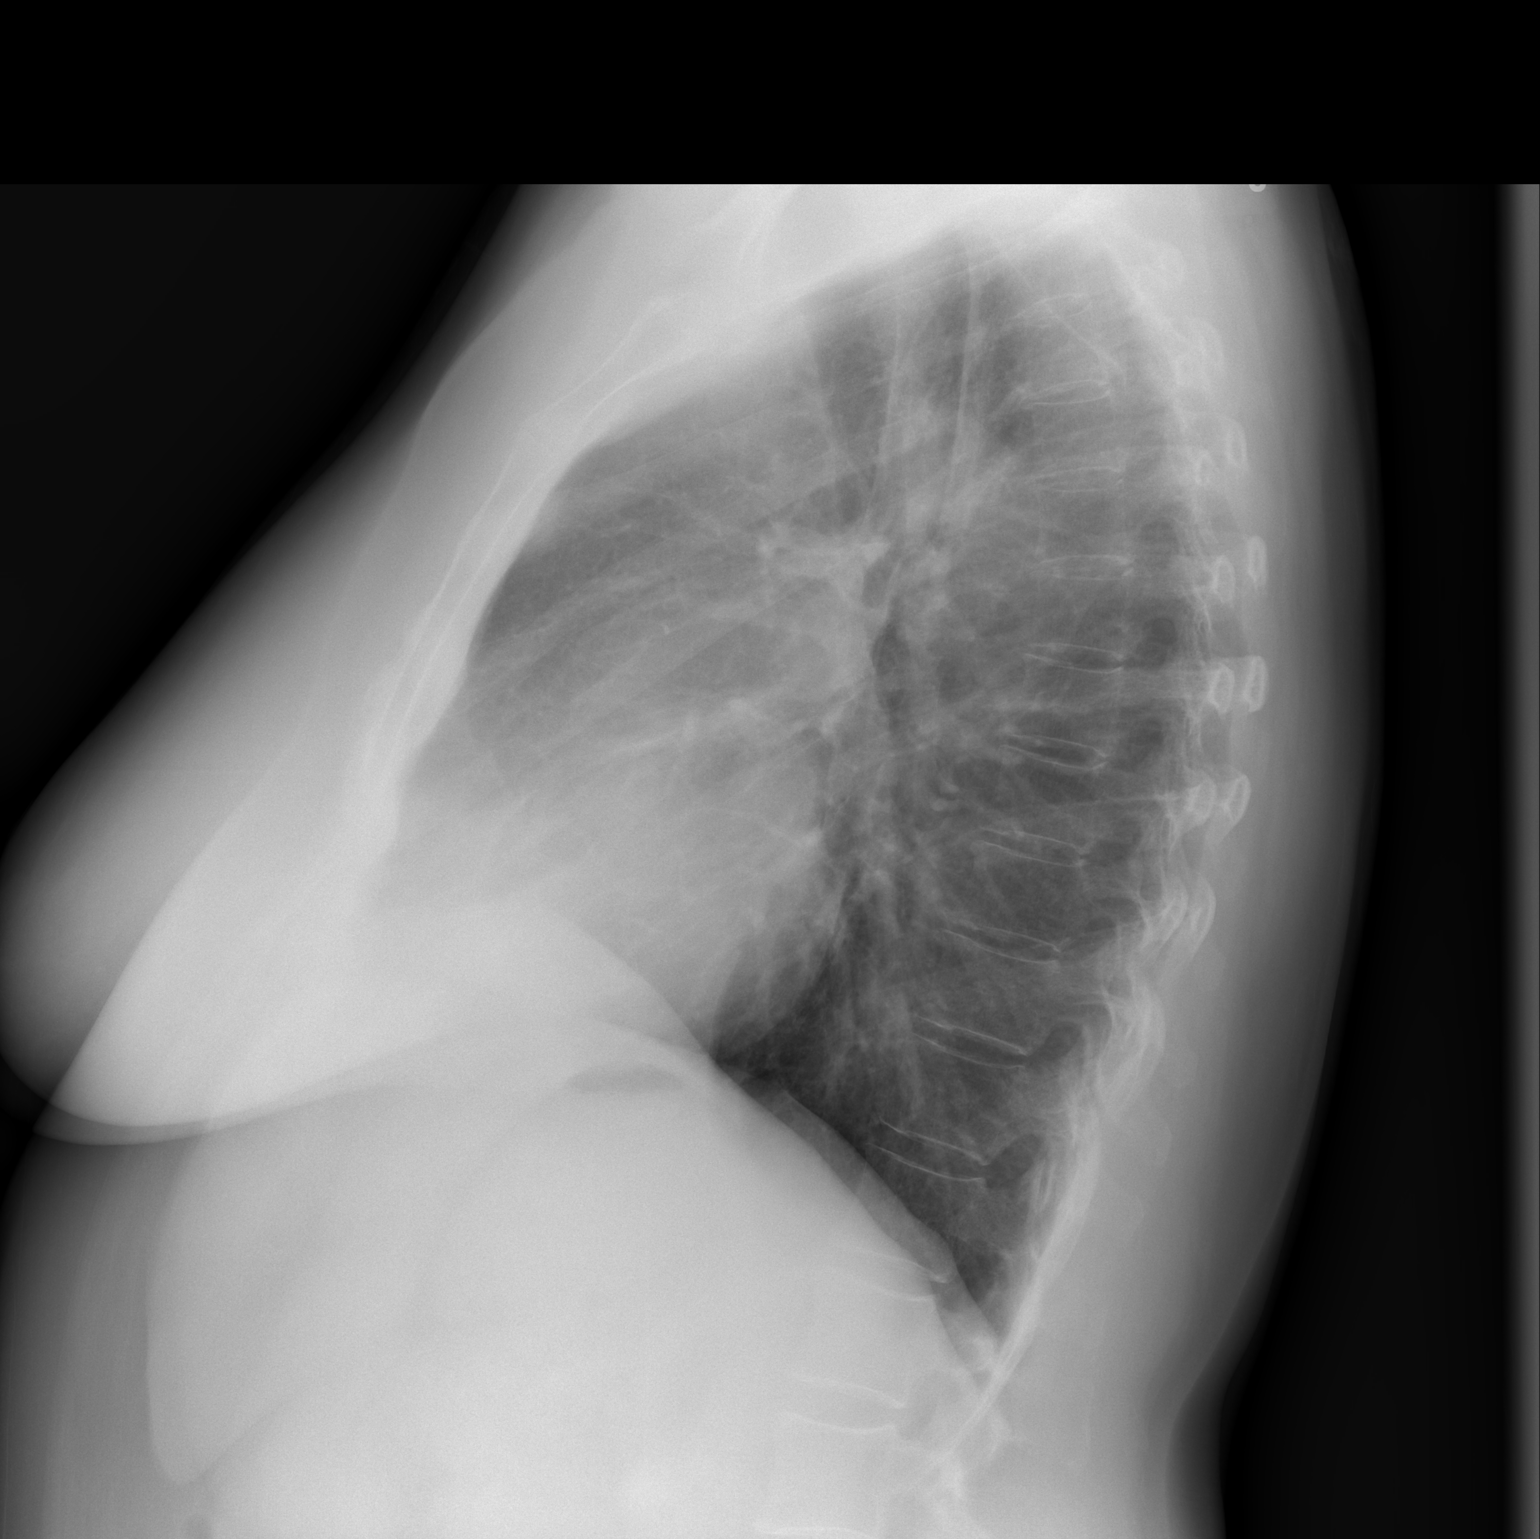

[2 of 2 positions shown; findings below may reference images not displayed]

FINDINGS: Negative for heart failure.  Lungs are clear without
infiltrate or effusion.  Negative for mass lesion.
IMPRESSION: No active cardiopulmonary disease.

## 2012-12-30 ENCOUNTER — Other Ambulatory Visit: Payer: Self-pay | Admitting: Family Medicine

## 2013-01-08 ENCOUNTER — Encounter: Payer: Self-pay | Admitting: Family Medicine

## 2013-01-08 ENCOUNTER — Ambulatory Visit (INDEPENDENT_AMBULATORY_CARE_PROVIDER_SITE_OTHER): Payer: Self-pay | Admitting: Family Medicine

## 2013-01-08 VITALS — BP 118/70 | HR 94 | Temp 98.9°F | Wt 189.0 lb

## 2013-01-08 DIAGNOSIS — R300 Dysuria: Secondary | ICD-10-CM

## 2013-01-08 LAB — POCT URINALYSIS DIPSTICK
Glucose, UA: NEGATIVE
Nitrite, UA: POSITIVE
Spec Grav, UA: 1.025
Urobilinogen, UA: 0.2
pH, UA: 6

## 2013-01-08 MED ORDER — CEPHALEXIN 500 MG PO CAPS: 500.0000 mg | ORAL_CAPSULE | Freq: Three times a day (TID) | ORAL | Status: DC

## 2013-01-08 NOTE — Patient Instructions (Addendum)
Urinary Tract Infection  Urinary tract infections (UTIs) can develop anywhere along your urinary tract. Your urinary tract is your body's drainage system for removing wastes and extra water. Your urinary tract includes two kidneys, two ureters, a bladder, and a urethra. Your kidneys are a pair of bean-shaped organs. Each kidney is about the size of your fist. They are located below your ribs, one on each side of your spine.  CAUSES  Infections are caused by microbes, which are microscopic organisms, including fungi, viruses, and bacteria. These organisms are so small that they can only be seen through a microscope. Bacteria are the microbes that most commonly cause UTIs.  SYMPTOMS   Symptoms of UTIs may vary by age and gender of the patient and by the location of the infection. Symptoms in young women typically include a frequent and intense urge to urinate and a painful, burning feeling in the bladder or urethra during urination. Older women and men are more likely to be tired, shaky, and weak and have muscle aches and abdominal pain. A fever may mean the infection is in your kidneys. Other symptoms of a kidney infection include pain in your back or sides below the ribs, nausea, and vomiting.  DIAGNOSIS  To diagnose a UTI, your caregiver will ask you about your symptoms. Your caregiver also will ask to provide a urine sample. The urine sample will be tested for bacteria and white blood cells. White blood cells are made by your body to help fight infection.  TREATMENT   Typically, UTIs can be treated with medication. Because most UTIs are caused by a bacterial infection, they usually can be treated with the use of antibiotics. The choice of antibiotic and length of treatment depend on your symptoms and the type of bacteria causing your infection.  HOME CARE INSTRUCTIONS   If you were prescribed antibiotics, take them exactly as your caregiver instructs you. Finish the medication even if you feel better after you  have only taken some of the medication.   Drink enough water and fluids to keep your urine clear or pale yellow.   Avoid caffeine, tea, and carbonated beverages. They tend to irritate your bladder.   Empty your bladder often. Avoid holding urine for long periods of time.   Empty your bladder before and after sexual intercourse.   After a bowel movement, women should cleanse from front to back. Use each tissue only once.  SEEK MEDICAL CARE IF:    You have back pain.   You develop a fever.   Your symptoms do not begin to resolve within 3 days.  SEEK IMMEDIATE MEDICAL CARE IF:    You have severe back pain or lower abdominal pain.   You develop chills.   You have nausea or vomiting.   You have continued burning or discomfort with urination.  MAKE SURE YOU:    Understand these instructions.   Will watch your condition.   Will get help right away if you are not doing well or get worse.  Document Released: 02/22/2005 Document Revised: 11/14/2011 Document Reviewed: 06/23/2011  [employer] Patient Information 2014 [employer]

## 2013-01-08 NOTE — Progress Notes (Signed)
  Subjective:    Patient ID: [name], female    DOB: 1951/04/25, 62 y.o.   MRN: [id number]  HPI Acute visit One-day history of urine frequency and burning with urination. Denies any fevers or chills. No nausea or vomiting. She has history of recurrent kidney stones. No recent gross hematuria. She had Escherichia coli UTI back in February treated with Keflex. She is drinking plenty of fluids.  Past Medical History  Diagnosis Date  . HYPOTHYROIDISM 10/06/2008  . HYPERLIPIDEMIA 10/06/2008  . DEPRESSION 02/23/2010  . ALLERGIC RHINITIS 07/23/2009  . Diverticulosis of colon 11/10/2008  . 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 Coumadin-all resolved. no coumadin since 11/09/11  . Pneumonia 11-17-11    1'13 postop pneumonia.   Marland Kitchen HYPERTENSION 10/06/2008    EKG 12/12 EPIC  . 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 Hospital-acquired pneumonia 05/28/2011  . Asthma 11-17-11    no flareups in adult yrs. Albuterol used rarely  . Chronic kidney disease 11-17-11    hx. past multiple kidney stones, prev. lithos  . Arthritis     osteoarthritis  . Anemia     no recently   Past Surgical History  Procedure Laterality Date  . Thyroid surgery  1982    tumor removed  . Cesarean section  1983  . Rotator cuff repair  11-17-11    left '05  . Kidney stone surgery  2000, 2009, 2010  . Laparoscopic assissted total colectomy w/ j-pouch  03/01/11  . Ileostomy closure  05/02/2011    ileosomy takedown  . Laparotomy  05/14/2011    small bowel resection, ileostomy  . Abdominal hysterectomy  1986    fibroids  . Eye surgery      lasik  bilaterally  . Appendectomy    . Colon surgery  12/12    ileostomy   . Ileo loop colostomy closure  01/18/2012    Procedure: LAPAROSCOPIC ILEO LOOP COLOSTOMY CLOSURE;  Surgeon: Adolph Pollack, MD;  Location: [facility];  Service: General;  Laterality: N/A;  Laparoscopic Ileostomy Closure , lysis of  adhesions     reports that she quit smoking about 32 years ago. Her smoking use included Cigarettes. She has a 10 pack-year smoking history. She has never used smokeless tobacco. She reports that she does not drink alcohol or use illicit drugs. family history includes Arthritis in her other; COPD in her mother; Deep vein thrombosis in her father; Depression in her father and mother; Mental illness in her other. Allergies  Allergen Reactions  . Modafinil Rash    Under breasts only.      Review of Systems  Constitutional: Negative for fever and chills.  Respiratory: Negative for cough.   Gastrointestinal: Negative for abdominal pain.  Genitourinary: Positive for dysuria and frequency.       Objective:   Physical Exam  Constitutional: She appears well-developed and well-nourished.  Cardiovascular: Normal rate and regular rhythm.   Pulmonary/Chest: Effort normal and breath sounds normal. No respiratory distress. She has no wheezes. She has no rales.          Assessment & Plan:  Uncomplicated cystitis. Keflex 500 mg 3 times a day for 7 days. Urine culture obtained

## 2013-01-09 ENCOUNTER — Ambulatory Visit: Payer: BC Managed Care – PPO | Admitting: Family Medicine

## 2013-01-11 LAB — URINE CULTURE: Colony Count: >=100000

## 2013-02-09 ENCOUNTER — Encounter (HOSPITAL_COMMUNITY): Payer: Self-pay

## 2013-02-09 ENCOUNTER — Encounter (HOSPITAL_COMMUNITY): Payer: Self-pay | Admitting: *Deleted

## 2013-02-09 ENCOUNTER — Inpatient Hospital Stay (HOSPITAL_COMMUNITY)
Admission: EM | Admit: 2013-02-09 | Discharge: 2013-02-11 | DRG: 416 | Disposition: A | Discharge status: Home / Self Care | Payer: BC Managed Care – PPO | Attending: Urology | Admitting: Urology

## 2013-02-09 ENCOUNTER — Encounter (HOSPITAL_COMMUNITY)
Admission: EM | Disposition: A | Discharge status: Home / Self Care | Payer: Self-pay | Source: Home / Self Care | Attending: Urology

## 2013-02-09 ENCOUNTER — Emergency Department (HOSPITAL_COMMUNITY): Payer: BC Managed Care – PPO

## 2013-02-09 ENCOUNTER — Observation Stay (HOSPITAL_COMMUNITY): Payer: BC Managed Care – PPO | Admitting: Anesthesiology

## 2013-02-09 ENCOUNTER — Encounter (HOSPITAL_COMMUNITY): Payer: Self-pay | Admitting: Anesthesiology

## 2013-02-09 DIAGNOSIS — Z90710 Acquired absence of both cervix and uterus: Secondary | ICD-10-CM

## 2013-02-09 DIAGNOSIS — I959 Hypotension, unspecified: Secondary | ICD-10-CM | POA: Diagnosis present

## 2013-02-09 DIAGNOSIS — M1990 Unspecified osteoarthritis, unspecified site: Secondary | ICD-10-CM | POA: Diagnosis present

## 2013-02-09 DIAGNOSIS — Z23 Encounter for immunization: Secondary | ICD-10-CM

## 2013-02-09 DIAGNOSIS — Z9049 Acquired absence of other specified parts of digestive tract: Secondary | ICD-10-CM

## 2013-02-09 DIAGNOSIS — N201 Calculus of ureter: Secondary | ICD-10-CM

## 2013-02-09 DIAGNOSIS — N12 Tubulo-interstitial nephritis, not specified as acute or chronic: Secondary | ICD-10-CM | POA: Diagnosis present

## 2013-02-09 DIAGNOSIS — D649 Anemia, unspecified: Secondary | ICD-10-CM | POA: Diagnosis present

## 2013-02-09 DIAGNOSIS — Z87891 Personal history of nicotine dependence: Secondary | ICD-10-CM

## 2013-02-09 DIAGNOSIS — E039 Hypothyroidism, unspecified: Secondary | ICD-10-CM | POA: Diagnosis present

## 2013-02-09 DIAGNOSIS — A419 Sepsis, unspecified organism: Secondary | ICD-10-CM | POA: Diagnosis present

## 2013-02-09 DIAGNOSIS — N1330 Unspecified hydronephrosis: Secondary | ICD-10-CM | POA: Diagnosis present

## 2013-02-09 DIAGNOSIS — N200 Calculus of kidney: Secondary | ICD-10-CM | POA: Diagnosis present

## 2013-02-09 DIAGNOSIS — I129 Hypertensive chronic kidney disease with stage 1 through stage 4 chronic kidney disease, or unspecified chronic kidney disease: Secondary | ICD-10-CM | POA: Diagnosis present

## 2013-02-09 DIAGNOSIS — I10 Essential (primary) hypertension: Secondary | ICD-10-CM

## 2013-02-09 DIAGNOSIS — F329 Major depressive disorder, single episode, unspecified: Secondary | ICD-10-CM | POA: Diagnosis present

## 2013-02-09 DIAGNOSIS — R000 Tachycardia, unspecified: Secondary | ICD-10-CM | POA: Diagnosis present

## 2013-02-09 DIAGNOSIS — F3289 Other specified depressive episodes: Secondary | ICD-10-CM | POA: Diagnosis present

## 2013-02-09 DIAGNOSIS — A4151 Sepsis due to Escherichia coli [E. coli]: Principal | ICD-10-CM | POA: Diagnosis present

## 2013-02-09 DIAGNOSIS — E785 Hyperlipidemia, unspecified: Secondary | ICD-10-CM | POA: Diagnosis present

## 2013-02-09 DIAGNOSIS — N189 Chronic kidney disease, unspecified: Secondary | ICD-10-CM | POA: Diagnosis present

## 2013-02-09 HISTORY — PX: CYSTOSCOPY W/ URETERAL STENT PLACEMENT: SHX1429

## 2013-02-09 LAB — URINALYSIS, ROUTINE W REFLEX MICROSCOPIC
Nitrite: NEGATIVE
Specific Gravity, Urine: 1.019 (ref 1.005–1.030)
Urobilinogen, UA: 0.2 mg/dL (ref 0.0–1.0)
pH: 5.5 (ref 5.0–8.0)

## 2013-02-09 LAB — URINE MICROSCOPIC-ADD ON

## 2013-02-09 LAB — CBC WITH DIFFERENTIAL/PLATELET
Basophils Relative: 0 % (ref 0–1)
Eosinophils Absolute: 0 10*3/uL (ref 0.0–0.7)
Hemoglobin: 10.6 g/dL — ABNORMAL LOW (ref 12.0–15.0)
MCH: 29.3 pg (ref 26.0–34.0)
MCHC: 33.2 g/dL (ref 30.0–36.0)
Monocytes Relative: 5 % (ref 3–12)
Neutro Abs: 9.7 10*3/uL — ABNORMAL HIGH (ref 1.7–7.7)
Neutrophils Relative %: 87 % — ABNORMAL HIGH (ref 43–77)
Platelets: 169 10*3/uL (ref 150–400)
RBC: 3.62 MIL/uL — ABNORMAL LOW (ref 3.87–5.11)

## 2013-02-09 LAB — BASIC METABOLIC PANEL
BUN: 17 mg/dL (ref 6–23)
Chloride: 102 mEq/L (ref 96–112)
GFR calc Af Amer: 51 mL/min — ABNORMAL LOW (ref >90)
GFR calc non Af Amer: 44 mL/min — ABNORMAL LOW (ref >90)
Potassium: 3.8 mEq/L (ref 3.5–5.1)
Sodium: 138 mEq/L (ref 135–145)

## 2013-02-09 SURGERY — CYSTOSCOPY, WITH RETROGRADE PYELOGRAM AND URETERAL STENT INSERTION
Anesthesia: General | Site: Ureter | Laterality: Right | Wound class: Clean Contaminated

## 2013-02-09 MED ORDER — BELLADONNA ALKALOIDS-OPIUM 16.2-60 MG RE SUPP
RECTAL | Status: DC | PRN
  Administered 2013-02-09: 1 via RECTAL

## 2013-02-09 MED ORDER — LACTATED RINGERS IV SOLN: INTRAVENOUS | Status: DC

## 2013-02-09 MED ORDER — DOCUSATE SODIUM 100 MG PO CAPS
100.0000 mg | ORAL_CAPSULE | Freq: Two times a day (BID) | ORAL | Status: DC
  Administered 2013-02-09 – 2013-02-10 (×2): 100 mg via ORAL
  Filled 2013-02-09 (×5): qty 1

## 2013-02-09 MED ORDER — ZOLPIDEM TARTRATE 5 MG PO TABS
5.0000 mg | ORAL_TABLET | Freq: Every evening | ORAL | Status: DC | PRN
  Administered 2013-02-09: 5 mg via ORAL
  Filled 2013-02-09: qty 1

## 2013-02-09 MED ORDER — ACETAMINOPHEN 325 MG PO TABS
ORAL_TABLET | ORAL | Status: AC
  Administered 2013-02-09: 650 mg
  Filled 2013-02-09: qty 2

## 2013-02-09 MED ORDER — CEPHALEXIN 500 MG PO CAPS: 500.0000 mg | ORAL_CAPSULE | Freq: Three times a day (TID) | ORAL | Status: DC

## 2013-02-09 MED ORDER — CIPROFLOXACIN IN D5W 400 MG/200ML IV SOLN
INTRAVENOUS | Status: AC
  Filled 2013-02-09: qty 200

## 2013-02-09 MED ORDER — ONDANSETRON HCL 4 MG/2ML IJ SOLN: 4.0000 mg | INTRAMUSCULAR | Status: DC | PRN

## 2013-02-09 MED ORDER — DEXTROSE 5 % IV SOLN
1.0000 g | Freq: Every day | INTRAVENOUS | Status: DC
  Administered 2013-02-10: 05:00:00 1 g via INTRAVENOUS
  Filled 2013-02-09: qty 10

## 2013-02-09 MED ORDER — HYDROMORPHONE HCL PF 1 MG/ML IJ SOLN: 0.2500 mg | INTRAMUSCULAR | Status: DC | PRN

## 2013-02-09 MED ORDER — ACETAMINOPHEN 325 MG PO TABS
650.0000 mg | ORAL_TABLET | ORAL | Status: DC | PRN
  Administered 2013-02-09: 650 mg via ORAL
  Filled 2013-02-09 (×3): qty 2

## 2013-02-09 MED ORDER — DIPHENHYDRAMINE HCL 50 MG/ML IJ SOLN: 12.5000 mg | Freq: Four times a day (QID) | INTRAMUSCULAR | Status: DC | PRN

## 2013-02-09 MED ORDER — SERTRALINE HCL 50 MG PO TABS
150.0000 mg | ORAL_TABLET | Freq: Every day | ORAL | Status: DC
  Administered 2013-02-09 – 2013-02-10 (×2): 150 mg via ORAL
  Filled 2013-02-09 (×3): qty 1

## 2013-02-09 MED ORDER — LIDOCAINE HCL 2 % EX GEL
CUTANEOUS | Status: DC | PRN
  Administered 2013-02-09: 1 via URETHRAL

## 2013-02-09 MED ORDER — DOCUSATE SODIUM 100 MG PO CAPS: 100.0000 mg | ORAL_CAPSULE | Freq: Two times a day (BID) | ORAL | Status: DC

## 2013-02-09 MED ORDER — DIPHENHYDRAMINE HCL 12.5 MG/5ML PO ELIX: 12.5000 mg | ORAL_SOLUTION | Freq: Four times a day (QID) | ORAL | Status: DC | PRN

## 2013-02-09 MED ORDER — LIDOCAINE HCL (PF) 2 % IJ SOLN
INTRAMUSCULAR | Status: DC | PRN
  Administered 2013-02-09: 75 mg

## 2013-02-09 MED ORDER — SODIUM CHLORIDE 0.9 % IV BOLUS (SEPSIS)
500.0000 mL | Freq: Once | INTRAVENOUS | Status: AC
  Administered 2013-02-09: 500 mL via INTRAVENOUS

## 2013-02-09 MED ORDER — STERILE WATER FOR IRRIGATION IR SOLN
Status: DC | PRN
  Administered 2013-02-09: 09:00:00 3000 mL

## 2013-02-09 MED ORDER — OXYCODONE-ACETAMINOPHEN 5-325 MG PO TABS: 1.0000 | ORAL_TABLET | ORAL | Status: DC | PRN

## 2013-02-09 MED ORDER — FENTANYL CITRATE 0.05 MG/ML IJ SOLN
INTRAMUSCULAR | Status: DC | PRN
  Administered 2013-02-09 (×2): 50 ug via INTRAVENOUS

## 2013-02-09 MED ORDER — DEXTROSE 5 % IV SOLN
1.0000 g | Freq: Once | INTRAVENOUS | Status: AC
  Administered 2013-02-09: 1 g via INTRAVENOUS
  Filled 2013-02-09: qty 10

## 2013-02-09 MED ORDER — HYDROMORPHONE HCL PF 1 MG/ML IJ SOLN
0.5000 mg | INTRAMUSCULAR | Status: DC | PRN
  Administered 2013-02-09 – 2013-02-11 (×5): 1 mg via INTRAVENOUS
  Filled 2013-02-09 (×5): qty 1

## 2013-02-09 MED ORDER — CIPROFLOXACIN IN D5W 400 MG/200ML IV SOLN
400.0000 mg | Freq: Once | INTRAVENOUS | Status: AC
  Administered 2013-02-09: 400 mg via INTRAVENOUS
  Filled 2013-02-09: qty 200

## 2013-02-09 MED ORDER — SODIUM CHLORIDE 0.9 % IV SOLN
INTRAVENOUS | Status: DC
  Administered 2013-02-09: 1000 mL via INTRAVENOUS

## 2013-02-09 MED ORDER — CIPROFLOXACIN IN D5W 400 MG/200ML IV SOLN
400.0000 mg | Freq: Two times a day (BID) | INTRAVENOUS | Status: DC
  Filled 2013-02-09 (×3): qty 200

## 2013-02-09 MED ORDER — MONTELUKAST SODIUM 10 MG PO TABS
10.0000 mg | ORAL_TABLET | Freq: Every day | ORAL | Status: DC
  Administered 2013-02-09 – 2013-02-10 (×2): 10 mg via ORAL
  Filled 2013-02-09 (×3): qty 1

## 2013-02-09 MED ORDER — 0.9 % SODIUM CHLORIDE (POUR BTL) OPTIME
TOPICAL | Status: DC | PRN
  Administered 2013-02-09: 1000 mL

## 2013-02-09 MED ORDER — LORATADINE 10 MG PO TABS
10.0000 mg | ORAL_TABLET | Freq: Every day | ORAL | Status: DC
  Administered 2013-02-10 – 2013-02-11 (×2): 10 mg via ORAL
  Filled 2013-02-09 (×2): qty 1

## 2013-02-09 MED ORDER — BELLADONNA ALKALOIDS-OPIUM 16.2-60 MG RE SUPP
RECTAL | Status: AC
  Filled 2013-02-09: qty 1

## 2013-02-09 MED ORDER — ALBUTEROL SULFATE HFA 108 (90 BASE) MCG/ACT IN AERS: 2.0000 | INHALATION_SPRAY | RESPIRATORY_TRACT | Status: DC | PRN

## 2013-02-09 MED ORDER — LIDOCAINE HCL 2 % EX GEL
CUTANEOUS | Status: AC
  Filled 2013-02-09: qty 10

## 2013-02-09 MED ORDER — SODIUM CHLORIDE 0.9 % IV SOLN
INTRAVENOUS | Status: DC
  Administered 2013-02-09 – 2013-02-11 (×3): via INTRAVENOUS

## 2013-02-09 MED ORDER — IOHEXOL 300 MG/ML  SOLN
INTRAMUSCULAR | Status: DC | PRN
  Administered 2013-02-09: 50 mL via ORAL

## 2013-02-09 MED ORDER — PROPOFOL 10 MG/ML IV BOLUS
INTRAVENOUS | Status: DC | PRN
  Administered 2013-02-09: 200 mg via INTRAVENOUS

## 2013-02-09 MED ORDER — SERTRALINE HCL 50 MG PO TABS
150.0000 mg | ORAL_TABLET | Freq: Every day | ORAL | Status: DC
  Filled 2013-02-09: qty 1

## 2013-02-09 MED ORDER — OXYCODONE-ACETAMINOPHEN 5-325 MG PO TABS
1.0000 | ORAL_TABLET | ORAL | Status: DC | PRN
  Administered 2013-02-09 – 2013-02-11 (×4): 1 via ORAL
  Filled 2013-02-09 (×4): qty 1

## 2013-02-09 MED ORDER — ACETAMINOPHEN 325 MG PO TABS: 650.0000 mg | ORAL_TABLET | Freq: Four times a day (QID) | ORAL | Status: DC | PRN

## 2013-02-09 MED ORDER — LEVOTHYROXINE SODIUM 175 MCG PO TABS
175.0000 ug | ORAL_TABLET | Freq: Every day | ORAL | Status: DC
  Administered 2013-02-10 – 2013-02-11 (×2): 175 ug via ORAL
  Filled 2013-02-09 (×3): qty 1

## 2013-02-09 MED ORDER — DEXTROSE 5 % IV SOLN: 1.0000 g | INTRAVENOUS | Status: DC

## 2013-02-09 MED ORDER — ONDANSETRON HCL 4 MG/2ML IJ SOLN
INTRAMUSCULAR | Status: DC | PRN
  Administered 2013-02-09: 4 mg via INTRAVENOUS

## 2013-02-09 MED ORDER — FENTANYL CITRATE 0.05 MG/ML IJ SOLN
100.0000 ug | Freq: Once | INTRAMUSCULAR | Status: AC
  Administered 2013-02-09: 100 ug via INTRAVENOUS
  Filled 2013-02-09: qty 2

## 2013-02-09 MED ORDER — FENTANYL CITRATE 0.05 MG/ML IJ SOLN
100.0000 ug | INTRAMUSCULAR | Status: DC | PRN
  Administered 2013-02-09: 100 ug via INTRAVENOUS
  Filled 2013-02-09: qty 2

## 2013-02-09 MED ORDER — HYOSCYAMINE SULFATE 0.125 MG SL SUBL
0.1250 mg | SUBLINGUAL_TABLET | SUBLINGUAL | Status: DC | PRN
  Filled 2013-02-09: qty 1

## 2013-02-09 SURGICAL SUPPLY — 10 items
BAG URINE DRAINAGE (UROLOGICAL SUPPLIES) ×1 IMPLANT
CATH FOLEY 2WAY SLVR  5CC 16FR (CATHETERS) ×1
CATH FOLEY 2WAY SLVR 5CC 16FR (CATHETERS) IMPLANT
DRAPE CAMERA CLOSED 9X96 (DRAPES) ×1 IMPLANT
GOWN PREVENTION PLUS LG XLONG (DISPOSABLE) ×1 IMPLANT
GOWN STRL REIN XL XLG (GOWN DISPOSABLE) ×1 IMPLANT
GUIDEWIRE STR DUAL SENSOR (WIRE) ×1 IMPLANT
PACK CYSTO (CUSTOM PROCEDURE TRAY) ×1 IMPLANT
STENT CONTOUR 6FRX26X.038 (STENTS) ×1 IMPLANT
SYR 30ML LL (SYRINGE) ×1 IMPLANT

## 2013-02-09 NOTE — Anesthesia Preprocedure Evaluation (Addendum)
Anesthesia Evaluation  Patient identified by MRN, date of birth, ID band Patient awake    Reviewed: Allergy & Precautions, H&P , NPO status , Patient's Chart, lab work & pertinent test results  Airway Mallampati: II TM Distance: >3 FB Neck ROM: full    Dental  (+) Missing and Dental Advisory Given Missing right upper lateral front tooth:   Pulmonary neg pulmonary ROS, asthma ,  No adult flare ups of asthma breath sounds clear to auscultation  Pulmonary exam normal       Cardiovascular Exercise Tolerance: Good hypertension, Pt. on medications Rhythm:regular Rate:Normal     Neuro/Psych negative neurological ROS  negative psych ROS   GI/Hepatic negative GI ROS, Neg liver ROS,   Endo/Other  negative endocrine ROSHypothyroidism   Renal/GU negative Renal ROS  negative genitourinary   Musculoskeletal   Abdominal   Peds  Hematology negative hematology ROS (+) hgb 10.6   Anesthesia Other Findings   Reproductive/Obstetrics negative OB ROS                          Anesthesia Physical Anesthesia Plan  ASA: II  Anesthesia Plan: General   Post-op Pain Management:    Induction: Intravenous  Airway Management Planned: LMA  Additional Equipment:   Intra-op Plan:   Post-operative Plan:   Informed Consent: I have reviewed the patients History and Physical, chart, labs and discussed the procedure including the risks, benefits and alternatives for the proposed anesthesia with the patient or authorized representative who has indicated his/her understanding and acceptance.   Dental Advisory Given  Plan Discussed with: CRNA and Surgeon  Anesthesia Plan Comments:         Anesthesia Quick Evaluation

## 2013-02-09 NOTE — ED Notes (Signed)
Pt started with pain on Friday then fever,  Bodyaches,  Chills last night,  Pt has known kidney stone.

## 2013-02-09 NOTE — Anesthesia Postprocedure Evaluation (Signed)
  Anesthesia Post-op Note  Patient: [name]  Procedure(s) Performed: Procedure(s) (LRB): CYSTOSCOPY WITH RETROGRADE PYELOGRAM/URETERAL STENT PLACEMENT (Right)  Patient Location: PACU  Anesthesia Type: General  Level of Consciousness: awake and alert   Airway and Oxygen Therapy: Patient Spontanous Breathing  Post-op Pain: mild  Post-op Assessment: Post-op Vital signs reviewed, Patient's Cardiovascular Status Stable, Respiratory Function Stable, Patent Airway and No signs of Nausea or vomiting  Last Vitals:  Filed Vitals:   02/09/13 1201  BP: 100/47  Pulse: 111  Temp: 38.8 C  Resp: 20    Post-op Vital Signs: stable   Complications: No apparent anesthesia complications

## 2013-02-09 NOTE — Transfer of Care (Signed)
Immediate Anesthesia Transfer of Care Note  Patient: [name]  Procedure(s) Performed: Procedure(s): CYSTOSCOPY WITH RETROGRADE PYELOGRAM/URETERAL STENT PLACEMENT (Right)  Patient Location: PACU  Anesthesia Type:General  Level of Consciousness: awake, alert , oriented and patient cooperative  Airway & Oxygen Therapy: Patient Spontanous Breathing and Patient connected to face mask oxygen  Post-op Assessment: Report given to PACU RN, Post -op Vital signs reviewed and stable and Patient moving all extremities X 4  Post vital signs: Reviewed and stable  Complications: No apparent anesthesia complications

## 2013-02-09 NOTE — Progress Notes (Signed)
500 cc bolus given. Pt with 100 cc urine output on unit and 100 cc in PACU.............Marland KitchenPt eithout complaints.Marland KitchenMarland KitchenAmber color urine noted

## 2013-02-09 NOTE — Progress Notes (Signed)
Pt sent to OR via bed with CN and NT. Condition stable. Consent signed in the ED. Husband accompanied pt to OR. Cont with plan of care

## 2013-02-09 NOTE — ED Provider Notes (Signed)
CSN: [id number]     Arrival date & time 02/09/13  0130 History   First MD Initiated Contact with Patient 02/09/13 0320     Chief Complaint  Patient presents with  . Fever   (Consider location/radiation/quality/duration/timing/severity/associated sxs/prior Treatment) HPI This is a 62 year old female with a history of nephrolithiasis. She is here with 2 days of right flank pain. This pain has been is severe as an 8/10 but is 4/10 at the present time after taking Percocet and to ibuprofen. The pain is consistent with prior kidney stones. She is also status post total colectomy for severe diverticular disease.  Her specific reason for coming this morning is fever, chills and body aches that began yesterday evening. Her temperature was 102.1 at home. Her fever improved after taking the ibuprofen and Percocet and was noted to be 99.6 on arrival. She is not having nausea or vomiting. She was treated about a week and a half ago for urinary tract infection with Keflex; her symptoms of that infection resolved.  Past Medical History  Diagnosis Date  . HYPOTHYROIDISM 10/06/2008  . HYPERLIPIDEMIA 10/06/2008  . DEPRESSION 02/23/2010  . ALLERGIC RHINITIS 07/23/2009  . Diverticulosis of colon 11/10/2008  . 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 Coumadin-all resolved. no coumadin since 11/09/11  . Pneumonia 11-17-11    1'13 postop pneumonia.   Marland Kitchen HYPERTENSION 10/06/2008    EKG 12/12 EPIC  . 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 Hospital-acquired pneumonia 05/28/2011  . Asthma 11-17-11    no flareups in adult yrs. Albuterol used rarely  . Chronic kidney disease 11-17-11    hx. past multiple kidney stones, prev. lithos  . Arthritis     osteoarthritis  . Anemia     no recently   Past Surgical History  Procedure Laterality Date  . Thyroid surgery  1982    tumor removed  . Cesarean section  1983  . Rotator cuff repair  11-17-11    left  '05  . Kidney stone surgery  2000, 2009, 2010  . Laparoscopic assissted total colectomy w/ j-pouch  03/01/11  . Ileostomy closure  05/02/2011    ileosomy takedown  . Laparotomy  05/14/2011    small bowel resection, ileostomy  . Abdominal hysterectomy  1986    fibroids  . Eye surgery      lasik  bilaterally  . Appendectomy    . Colon surgery  12/12    ileostomy   . Ileo loop colostomy closure  01/18/2012    Procedure: LAPAROSCOPIC ILEO LOOP COLOSTOMY CLOSURE;  Surgeon: Adolph Pollack, MD;  Location: [facility];  Service: General;  Laterality: N/A;  Laparoscopic Ileostomy Closure , lysis of adhesions    Family History  Problem Relation Age of Onset  . Arthritis Other   . Mental illness Other   . COPD Mother   . Depression Mother   . Deep vein thrombosis Father   . Depression Father    History  Substance Use Topics  . Smoking status: Former Smoker -- 1.00 packs/day for 10 years    Types: Cigarettes    Quit date: 08/02/1980  . Smokeless tobacco: Never Used  . Alcohol Use: No   OB History   Grav Para Term Preterm Abortions TAB SAB Ect Mult Living                 Review of Systems  All other systems reviewed and are negative.  Allergies  Modafinil  Home Medications   Current Outpatient Rx  Name  Route  Sig  Dispense  Refill  . albuterol (PROVENTIL HFA;VENTOLIN HFA) 108 (90 BASE) MCG/ACT inhaler   Inhalation   Inhale 2 puffs into the lungs every 4 (four) hours as needed for wheezing.   1 Inhaler   2   . ferrous gluconate (FERGON) 225 (27 FE) MG tablet   Oral   Take 240 mg by mouth daily.         Marland Kitchen ibuprofen (ADVIL,MOTRIN) 200 MG tablet   Oral   Take 800 mg by mouth every 6 (six) hours as needed. pain         . levothyroxine (SYNTHROID, LEVOTHROID) 175 MCG tablet      take 1 tablet by mouth once daily   30 tablet   5   . loratadine (CLARITIN) 10 MG tablet   Oral   Take 10 mg by mouth daily.         Marland Kitchen losartan (COZAAR) 50 MG tablet   Oral    Take 50 mg by mouth daily before breakfast.         . montelukast (SINGULAIR) 10 MG tablet   Oral   Take 10 mg by mouth at bedtime.          . Multiple Vitamin (MULTIVITAMIN) tablet   Oral   Take 1 tablet by mouth daily.         . Omega-3 Fatty Acids (FISH OIL) 1000 MG CAPS   Oral   Take 1 capsule by mouth daily.         Marland Kitchen oxyCODONE-acetaminophen (ROXICET) 5-325 MG per tablet   Oral   Take 1 tablet by mouth every 4 (four) hours as needed for pain.   30 tablet   0   . PSYLLIUM HUSK PO   Oral   Take 3.12 g by mouth daily.         . sertraline (ZOLOFT) 100 MG tablet   Oral   Take 150 mg by mouth daily. Takes one and one half tablet to equal 150mg          . vitamin A 8000 UNIT capsule   Oral   Take 8,000 Units by mouth daily.         Marland Kitchen zinc gluconate 50 MG tablet   Oral   Take 50 mg by mouth daily.         Marland Kitchen zolpidem (AMBIEN) 10 MG tablet      take 1 tablet by mouth at bedtime if needed   30 tablet   5    BP 119/91  Pulse 118  Temp(Src) 99.6 F (37.6 C) (Oral)  Resp 18  SpO2 100%  Physical Exam General: Well-developed, well-nourished female in no acute distress; appearance consistent with age of record HENT: normocephalic; atraumatic Eyes: pupils equal, round and reactive to light; extraocular muscles intact Neck: supple Heart: regular rate and rhythm Lungs: clear to auscultation bilaterally Abdomen: soft; nondistended; nontender; bowel sounds present GU: Mild right CVA tenderness Extremities: No deformity; full range of motion; pulses normal Neurologic: Awake, alert and oriented; motor function intact in all extremities and symmetric; no facial droop Skin: Warm and dry Psychiatric: Normal mood and affect    ED Course  Procedures (including critical care time)   MDM   Nursing notes and vitals signs, including pulse oximetry, reviewed.  Summary of this visit's results, reviewed by myself:  Labs:  Results for orders placed during  the hospital encounter of 02/09/13 (from the past 24 hour(s))  CBC WITH DIFFERENTIAL     Status: Abnormal   Collection Time    02/09/13  2:58 AM      Result Value Range   WBC 11.1 (*) 4.0 - 10.5 K/uL   RBC 3.62 (*) 3.87 - 5.11 MIL/uL   Hemoglobin 10.6 (*) 12.0 - 15.0 g/dL   HCT 04.5 (*) 40.9 - 81.1 %   MCV 88.1  78.0 - 100.0 fL   MCH 29.3  26.0 - 34.0 pg   MCHC 33.2  30.0 - 36.0 g/dL   RDW 91.4  78.2 - 95.6 %   Platelets 169  150 - 400 K/uL   Neutrophils Relative % 87 (*) 43 - 77 %   Neutro Abs 9.7 (*) 1.7 - 7.7 K/uL   Lymphocytes Relative 8 (*) 12 - 46 %   Lymphs Abs 0.9  0.7 - 4.0 K/uL   Monocytes Relative 5  3 - 12 %   Monocytes Absolute 0.6  0.1 - 1.0 K/uL   Eosinophils Relative 0  0 - 5 %   Eosinophils Absolute 0.0  0.0 - 0.7 K/uL   Basophils Relative 0  0 - 1 %   Basophils Absolute 0.0  0.0 - 0.1 K/uL  BASIC METABOLIC PANEL     Status: Abnormal   Collection Time    02/09/13  2:58 AM      Result Value Range   Sodium 138  135 - 145 mEq/L   Potassium 3.8  3.5 - 5.1 mEq/L   Chloride 102  96 - 112 mEq/L   CO2 22  19 - 32 mEq/L   Glucose, Bld 99  70 - 99 mg/dL   BUN 17  6 - 23 mg/dL   Creatinine, Ser 2.13 (*) 0.50 - 1.10 mg/dL   Calcium 08.6  8.4 - 57.8 mg/dL   GFR calc non Af Amer 44 (*) >90 mL/min   GFR calc Af Amer 51 (*) >90 mL/min  URINALYSIS, ROUTINE W REFLEX MICROSCOPIC     Status: Abnormal   Collection Time    02/09/13  3:09 AM      Result Value Range   Color, Urine YELLOW  YELLOW   APPearance TURBID (*) CLEAR   Specific Gravity, Urine 1.019  1.005 - 1.030   pH 5.5  5.0 - 8.0   Glucose, UA NEGATIVE  NEGATIVE mg/dL   Hgb urine dipstick LARGE (*) NEGATIVE   Bilirubin Urine NEGATIVE  NEGATIVE   Ketones, ur NEGATIVE  NEGATIVE mg/dL   Protein, ur 30 (*) NEGATIVE mg/dL   Urobilinogen, UA 0.2  0.0 - 1.0 mg/dL   Nitrite NEGATIVE  NEGATIVE   Leukocytes, UA LARGE (*) NEGATIVE  URINE MICROSCOPIC-ADD ON     Status: Abnormal   Collection Time    02/09/13  3:09 AM       Result Value Range   Squamous Epithelial / LPF RARE  RARE   WBC, UA TOO NUMEROUS TO COUNT  <3 WBC/hpf   RBC / HPF 11-20  <3 RBC/hpf   Bacteria, UA MANY (*) RARE   Urine-Other MICROSCOPIC EXAM PERFORMED ON UNCONCENTRATED URINE      Imaging Studies: Ct Abdomen Pelvis Wo Contrast  02/09/2013   CLINICAL DATA:  Right flank pain.  EXAM: CT ABDOMEN AND PELVIS WITHOUT CONTRAST  TECHNIQUE: Multidetector CT imaging of the abdomen and pelvis was performed following the standard protocol without intravenous contrast.  COMPARISON:  07/04/2012.  FINDINGS: BODY  WALL: Lower ventral abdominal wall hernia, wide necked and containing nonobstructed bowel.  LOWER CHEST:  Mediastinum: Unremarkable.  Lungs/pleura: No consolidation.  ABDOMEN/PELVIS:  Liver: 3 benign cysts within the liver are unchanged from prior, the largest in the upper right lobe, 2.7 x 2 cm axial span.  Biliary: Distended without a radiodense stone or pericholecystic inflammation.  Pancreas: Unremarkable.  Spleen: Unremarkable.  Adrenals: Unremarkable.  Kidneys and ureters: 3 mm mid right ureteral calculus with mild pelviectasis. Interval migration of the largest right stone, 7 mm in diameter, now within the right renal pelvis. There is urothelial thickening on the right, affecting the pelvis. Punctate left nephrolithiasis, interpolar.  Bladder: Unremarkable.  Bowel: Subtotal colectomy. No bowel obstruction.  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  Peritoneum: No free fluid or gas.  Reproductive: Hysterectomy. Gas present within the upper vaginal canal, nonspecific. There is a relatively discrete plane between the bladder and vagina.  Vascular: No acute abnormality.  OSSEOUS: No acute abnormalities. No suspicious lytic or blastic lesions.  IMPRESSION: 1. 3 mm mid right ureteral calculus with mild pelviectasis. 2. Right urothelial thickening which may be reactive to the stones,  or represent infection/inflammation given history of fever. 3. Interval migration of a 7 mm right renal stone, now located in the renal pelvis. 4. Punctate left nephrolithiasis.   Electronically Signed   By: Tiburcio Pea   On: 02/09/2013 05:41    Rocephin ordered for pyelonephritis. CT scan shows appears to be an obstructing ureteral stone. We'll contact urology for emergent intervention.  5:46 AM Dr. Mena Goes consulted, plans to take patient to the OR later this morning. We will keep her n.p.o. in the meantime with IV fluids running.  Hanley Seamen, MD 02/09/13 [phone]

## 2013-02-09 NOTE — H&P (Signed)
H&P   History of Present Illness: Patient with a three-day history of right flank pain which developed Thursday and has been severe at 8/10. Yesterday she began to have a fever up to 102 with chills. She took Percocet and ibuprofen and her temperature is 99.6 in the ER. She has no nausea or vomiting.  She has history of stones and needed stone treatment. She typically follows with Dr. Annabell Howells  A CT scan revealed a 7 mm stone in the right renal pelvis and a 3 mm stone in the right proximal ureter with mild hydronephrosis. There were no less dense. I reviewed all the images.     Past Medical History  Diagnosis Date  . HYPOTHYROIDISM 10/06/2008  . HYPERLIPIDEMIA 10/06/2008  . DEPRESSION 02/23/2010  . ALLERGIC RHINITIS 07/23/2009  . Diverticulosis of colon 11/10/2008  . 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 Coumadin-all resolved. no coumadin since 11/09/11  . Pneumonia 11-17-11    1'13 postop pneumonia.   Marland Kitchen HYPERTENSION 10/06/2008    EKG 12/12 EPIC  . 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 Hospital-acquired pneumonia 05/28/2011  . Asthma 11-17-11    no flareups in adult yrs. Albuterol used rarely  . Chronic kidney disease 11-17-11    hx. past multiple kidney stones, prev. lithos  . Arthritis     osteoarthritis  . Anemia     no recently   Past Surgical History  Procedure Laterality Date  . Thyroid surgery  1982    tumor removed  . Cesarean section  1983  . Rotator cuff repair  11-17-11    left '05  . Kidney stone surgery  2000, 2009, 2010  . Laparoscopic assissted total colectomy w/ j-pouch  03/01/11  . Ileostomy closure  05/02/2011    ileosomy takedown  . Laparotomy  05/14/2011    small bowel resection, ileostomy  . Abdominal hysterectomy  1986    fibroids  . Eye surgery      lasik  bilaterally  . Appendectomy    . Colon surgery  12/12    ileostomy   . Ileo loop colostomy closure  01/18/2012    Procedure:  LAPAROSCOPIC ILEO LOOP COLOSTOMY CLOSURE;  Surgeon: Adolph Pollack, MD;  Location: [facility];  Service: General;  Laterality: N/A;  Laparoscopic Ileostomy Closure , lysis of adhesions     Home Medications:   (Not in a hospital admission) Allergies:  Allergies  Allergen Reactions  . Modafinil Rash    Under breasts only.    Family History  Problem Relation Age of Onset  . Arthritis Other   . Mental illness Other   . COPD Mother   . Depression Mother   . Deep vein thrombosis Father   . Depression Father    Social History:  reports that she quit smoking about 32 years ago. Her smoking use included Cigarettes. She has a 10 pack-year smoking history. She has never used smokeless tobacco. She reports that she does not drink alcohol or use illicit drugs.  ROS: A complete review of systems was performed.  All systems are negative except for pertinent findings as noted. @ROS @   Physical Exam:  Vital signs in last 24 hours: Temp:  [99.6 F (37.6 C)] 99.6 F (37.6 C) (09/14 0135) Pulse Rate:  [118] 118 (09/14 0135) Resp:  [18] 18 (09/14 0135) BP: (119)/(91) 119/91 mmHg (09/14 0135) SpO2:  [100 %] 100 % (09/14 0135) General:  Alert and  oriented, No acute distress HEENT: Normocephalic, atraumatic Neck: No JVD or lymphadenopathy Cardiovascular: Regular rate and rhythm Lungs: Regular rate and effort Abdomen: Soft, nontender, nondistended, no abdominal masses Back: No CVA tenderness Extremities: No edema Neurologic: Grossly intact  Laboratory Data:  Results for orders placed during the hospital encounter of 02/09/13 (from the past 24 hour(s))  CBC WITH DIFFERENTIAL     Status: Abnormal   Collection Time    02/09/13  2:58 AM      Result Value Range   WBC 11.1 (*) 4.0 - 10.5 K/uL   RBC 3.62 (*) 3.87 - 5.11 MIL/uL   Hemoglobin 10.6 (*) 12.0 - 15.0 g/dL   HCT 16.1 (*) 09.6 - 04.5 %   MCV 88.1  78.0 - 100.0 fL   MCH 29.3  26.0 - 34.0 pg   MCHC 33.2  30.0 - 36.0 g/dL   RDW 40.9   81.1 - 91.4 %   Platelets 169  150 - 400 K/uL   Neutrophils Relative % 87 (*) 43 - 77 %   Neutro Abs 9.7 (*) 1.7 - 7.7 K/uL   Lymphocytes Relative 8 (*) 12 - 46 %   Lymphs Abs 0.9  0.7 - 4.0 K/uL   Monocytes Relative 5  3 - 12 %   Monocytes Absolute 0.6  0.1 - 1.0 K/uL   Eosinophils Relative 0  0 - 5 %   Eosinophils Absolute 0.0  0.0 - 0.7 K/uL   Basophils Relative 0  0 - 1 %   Basophils Absolute 0.0  0.0 - 0.1 K/uL  BASIC METABOLIC PANEL     Status: Abnormal   Collection Time    02/09/13  2:58 AM      Result Value Range   Sodium 138  135 - 145 mEq/L   Potassium 3.8  3.5 - 5.1 mEq/L   Chloride 102  96 - 112 mEq/L   CO2 22  19 - 32 mEq/L   Glucose, Bld 99  70 - 99 mg/dL   BUN 17  6 - 23 mg/dL   Creatinine, Ser 7.82 (*) 0.50 - 1.10 mg/dL   Calcium 95.6  8.4 - 21.3 mg/dL   GFR calc non Af Amer 44 (*) >90 mL/min   GFR calc Af Amer 51 (*) >90 mL/min  URINALYSIS, ROUTINE W REFLEX MICROSCOPIC     Status: Abnormal   Collection Time    02/09/13  3:09 AM      Result Value Range   Color, Urine YELLOW  YELLOW   APPearance TURBID (*) CLEAR   Specific Gravity, Urine 1.019  1.005 - 1.030   pH 5.5  5.0 - 8.0   Glucose, UA NEGATIVE  NEGATIVE mg/dL   Hgb urine dipstick LARGE (*) NEGATIVE   Bilirubin Urine NEGATIVE  NEGATIVE   Ketones, ur NEGATIVE  NEGATIVE mg/dL   Protein, ur 30 (*) NEGATIVE mg/dL   Urobilinogen, UA 0.2  0.0 - 1.0 mg/dL   Nitrite NEGATIVE  NEGATIVE   Leukocytes, UA LARGE (*) NEGATIVE  URINE MICROSCOPIC-ADD ON     Status: Abnormal   Collection Time    02/09/13  3:09 AM      Result Value Range   Squamous Epithelial / LPF RARE  RARE   WBC, UA TOO NUMEROUS TO COUNT  <3 WBC/hpf   RBC / HPF 11-20  <3 RBC/hpf   Bacteria, UA MANY (*) RARE   Urine-Other MICROSCOPIC EXAM PERFORMED ON UNCONCENTRATED URINE     No results found for  this or any previous visit (from the past 240 hour(s)). Creatinine:  Recent Labs  02/09/13 0258  CREATININE 1.30*   CT - I reviewed all  images - 3 mm prox stone with minimal hydro, stone in right renal pelvis.   Impression/Assessment/Plan: -UTI - on rocephin - tachy but good pressure and temp at 99 -right ureteral stone - small and minimal hydro but in setting on fever, tachy, elevated WBC and UTI favor ureteral stent placement.   I discussed with the patient and her husband the nature, potential benefits, risks and alternatives to cystoccopy, right retrograde pyelogram and right ureteral stent placement, including side effects of the proposed treatment, the likelihood of the patient achieving the goals of the procedure, and any potential problems that might occur during the procedure or recuperation. All questions answered. Patient elects to proceed. Discussed rationale for not performing ureteroscopy.     Antony Haste 02/09/2013, 6:09 AM

## 2013-02-09 NOTE — ED Notes (Signed)
I have just given report to [place] on [address].  Pt. To be transported by our tech. Shortly.  Pt. Remains in no distress.

## 2013-02-09 NOTE — Op Note (Addendum)
Preoperative diagnosis: Right pyelonephritis, right renal stone, right ureteral stone Postoperative diagnosis: Right pyelonephritis, right renal stone  Procedure: Cystoscopy Exam under anesthesia Right retrograde pyelogram Right ureteral stent placement  Surgeon: Mena Goes  Anesthesia: [place]  Type of anesthesia: Gen.  Findings: On cystoscopy the bladder appeared normal without stone. The trigone and ureteral orifices were in there normal orthotopic position.   Right retrograde pyelogram-outlined a single ureter single collecting system unit with a normal ureter, 1 cm filling defect in the renal pelvis consistent with the known stone. Otherwise normal collecting system possibly some mild dilation.   Exam under anesthesia-a Foley catheter and B&O suppository placed at the end of the case and on exam there was no bladder or urethral masses palpable. 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 No rectal or vaginal masses palpated.   Description of procedure: After consent was obtained patient brought to the operating room. After adequate anesthesia she was placed in lithotomy position and prepped and draped in usual sterile fashion. A timeout was performed to confirm the patient and procedure. Cystoscope was passed per urethra and the right ureteral orifice cannulated with an open-ended ureteral catheter. Retrograde injection of contrast was performed. A sensor wire was then advanced and coiled in the collecting system without difficulty. A 6 x 26 cm ureteral stent was then advanced and the wire removed. A good coil seen in the collecting system and a good coil in the bladder. The scope was removed. A 16 French Foley was placed to maximally drain the system for a few hours and a B&O suppository was placed. She was awakened and taken to recovery room in stable condition.  Complications: None Blood loss: None Specimens: None  Drains: Right ureteral stent 16 French  Foley  Disposition: Patient stable to PACU

## 2013-02-10 ENCOUNTER — Inpatient Hospital Stay (HOSPITAL_COMMUNITY): Payer: BC Managed Care – PPO

## 2013-02-10 ENCOUNTER — Encounter (HOSPITAL_COMMUNITY): Payer: Self-pay | Admitting: Urology

## 2013-02-10 DIAGNOSIS — N201 Calculus of ureter: Secondary | ICD-10-CM

## 2013-02-10 DIAGNOSIS — N12 Tubulo-interstitial nephritis, not specified as acute or chronic: Secondary | ICD-10-CM

## 2013-02-10 DIAGNOSIS — I959 Hypotension, unspecified: Secondary | ICD-10-CM | POA: Diagnosis not present

## 2013-02-10 DIAGNOSIS — I10 Essential (primary) hypertension: Secondary | ICD-10-CM

## 2013-02-10 LAB — BASIC METABOLIC PANEL
CO2: 21 mEq/L (ref 19–32)
Calcium: 8.7 mg/dL (ref 8.4–10.5)
Creatinine, Ser: 1.44 mg/dL — ABNORMAL HIGH (ref 0.50–1.10)
Glucose, Bld: 169 mg/dL — ABNORMAL HIGH (ref 70–99)

## 2013-02-10 LAB — CBC
Hemoglobin: 8.4 g/dL — ABNORMAL LOW (ref 12.0–15.0)
MCH: 28.7 pg (ref 26.0–34.0)
MCV: 89.8 fL (ref 78.0–100.0)
RBC: 2.93 MIL/uL — ABNORMAL LOW (ref 3.87–5.11)

## 2013-02-10 LAB — GLUCOSE, CAPILLARY: Glucose-Capillary: 122 mg/dL — ABNORMAL HIGH (ref 70–99)

## 2013-02-10 MED ORDER — INFLUENZA VAC SPLIT QUAD 0.5 ML IM SUSP
0.5000 mL | INTRAMUSCULAR | Status: AC
Start: 2013-02-11 — End: 2013-02-11
  Administered 2013-02-11: 0.5 mL via INTRAMUSCULAR
  Filled 2013-02-10 (×2): qty 0.5

## 2013-02-10 MED ORDER — PIPERACILLIN-TAZOBACTAM 3.375 G IVPB
3.3750 g | Freq: Three times a day (TID) | INTRAVENOUS | Status: DC
  Administered 2013-02-10 – 2013-02-11 (×3): 3.375 g via INTRAVENOUS
  Filled 2013-02-10 (×4): qty 50

## 2013-02-10 MED ORDER — FERROUS GLUCONATE 324 (38 FE) MG PO TABS
324.0000 mg | ORAL_TABLET | Freq: Every day | ORAL | Status: DC
  Administered 2013-02-11: 324 mg via ORAL
  Filled 2013-02-10 (×2): qty 1

## 2013-02-10 MED ORDER — FERROUS GLUCONATE 225 (27 FE) MG PO TABS: 240.0000 mg | ORAL_TABLET | Freq: Every day | ORAL | Status: DC

## 2013-02-10 NOTE — Progress Notes (Addendum)
Patient ID: [name], female   DOB: 1950/07/31, 62 y.o.   MRN: [id number]  Pt c/o headache. No CP or SOB. Had a BM last night. No flank pain.   Tm 103 post-op, 102 last night  Filed Vitals:   02/10/13 0606  BP: 98/48  Pulse: 100  Temp: 100.9 F (38.3 C)  Resp: 18    Intake/Output Summary (Last 24 hours) at 02/10/13 0745 Last data filed at 02/10/13 0730  Gross per 24 hour  Intake 4777.5 ml  Output   1300 ml  Net 3477.5 ml     PE:  NAD Abd - soft, NT A&Ox3 with no focal deficits Ext - scd's     CBC    Component Value Date/Time   WBC 12.1* 02/10/2013 0423   RBC 2.93* 02/10/2013 0423   HGB 8.4* 02/10/2013 0423   HCT 26.3* 02/10/2013 0423   PLT 120* 02/10/2013 0423   MCV 89.8 02/10/2013 0423   MCH 28.7 02/10/2013 0423   MCHC 31.9 02/10/2013 0423   RDW 15.3 02/10/2013 0423   LYMPHSABS 0.9 02/09/2013 0258   MONOABS 0.6 02/09/2013 0258   EOSABS 0.0 02/09/2013 0258   BASOSABS 0.0 02/09/2013 0258    BMET    Component Value Date/Time   NA 136 02/10/2013 0423   K 4.2 02/10/2013 0423   CL 105 02/10/2013 0423   CO2 21 02/10/2013 0423   GLUCOSE 169* 02/10/2013 0423   BUN 23 02/10/2013 0423   CREATININE 1.44* 02/10/2013 0423   CALCIUM 8.7 02/10/2013 0423   CALCIUM 10.0 03/18/2012 1355   GFRNONAA 39* 02/10/2013 0423   GFRAA 45* 02/10/2013 0423   Imp/Plan: Anemia - h/o anemia - follow h/h, restart Fe Hypotension, tachycardia - stable, UOP adequate Pyelonephritis  - on abx, expected fever curve Nephrolithiasis - s/p stent Will have hospitalist see and examine to assist with anemia, hypotension, tachycardia.

## 2013-02-10 NOTE — Progress Notes (Deleted)
Pt IV infiltrated. Started a new IV in right forearm with great blood return. Came back an hour later and this IV had infiltrated. Paged IV team. IV team RN stated that she would be her shortly. Will continue to monitor.   

## 2013-02-10 NOTE — Progress Notes (Signed)
Patient c/o headache (10/10). Medication was administered. Will continue to monitor the patient.

## 2013-02-10 NOTE — Progress Notes (Signed)
ANTIBIOTIC CONSULT NOTE - INITIAL  Pharmacy Consult for Zosyn Indication: UTI/pyelo  Allergies  Allergen Reactions  . Modafinil Rash    Under breasts only.    Patient Measurements: Height: 5\' 8"  (172.7 cm) Weight: 191 lb 5.8 oz (86.8 kg) IBW/kg (Calculated) : 63.9  Vital Signs: Temp: 100.9 F (38.3 C) (09/15 0606) Temp src: Oral (09/15 0606) BP: 98/48 mmHg (09/15 0606) Pulse Rate: 100 (09/15 0606) Intake/Output from previous day: 09/14 0701 - 09/15 0700 In: 2320 [P.O.:120; I.V.:1950; IV Piggyback:250] Out: 1300 [Urine:1300] Intake/Output from this shift: Total I/O In: 2457.5 [I.V.:2457.5] Out: -   Labs:  Recent Labs  02/09/13 0258 02/10/13 0423  WBC 11.1* 12.1*  HGB 10.6* 8.4*  PLT 169 120*  CREATININE 1.30* 1.44*   Estimated Creatinine Clearance: 47.9 ml/min (by C-G formula based on Cr of 1.44). No results found for this basename: Rolm Gala, VANCORANDOM, GENTTROUGH, GENTPEAK, GENTRANDOM, TOBRATROUGH, TOBRAPEAK, TOBRARND, AMIKACINPEAK, AMIKACINTROU, AMIKACIN,  in the last 72 hours    Assessment: 69 yoF with h/o nephrolithiasis presented 9/14 with R flank pain, fever. Patient was recently treated with Keflex for UTI. CT abd/pelvis with R renal pelvis stone and R proximal ureter stone with mild hydronephrosis. Rocephin was started for UTI/pyelonephritis. Patient underwent R stent placement 9/14. On 9/15, pt continues to spike fever - abx broaden to Zosyn. Note patient with h/o E.coli UTI that were fairly sensitive.   9/14 Cipro x 1  9/14 >> Rocephin >> 9/14 9/15 >> Zosyn >>   Tmax: 103.1 WBCs: 12.1K Renal: Scr 1.44, CG 48 ml/min  9/14 urine >> in process 0/14 mrsa pcr >> negative   Plan:   Zosyn 3.375 gm IV x q8h extended infusion over 4 hours  Pharmacy will f/u  Geoffry Paradise, PharmD, BCPS Pager: [phone] 9:27 AM Pharmacy #: [id number]

## 2013-02-10 NOTE — Progress Notes (Signed)
Patient stated that her headache was diminished. Now more like 5/10.   Bladder scanner was used. Yielded 16 ml. Patient did not c/o full bladder.  Will continue to monitor the patient.

## 2013-02-10 NOTE — Consult Note (Signed)
PCP:   Kristian Covey, MD   Reason for consult: Hypotension  Consulting physician: Dr. Mena Goes  HPI: 62 year old female with history of hyperlipidemia, hypothyroidism, hypertension, status post total colectomy, who was admitted under urology service with UTI and right ureteral stone. Patient underwent ureteral stent placement.patient is currently on Rocephin and continues to be mildly hypotensive with blood pressure in high 90s. Patient is currently on IV normal saline at 150 per hour. She denies chest pain or shortness of breath. Patient has history of hypertension and has been taking Cozaar for long time .she also denies nausea vomiting or diarrhea .  Allergies:   Allergies  Allergen Reactions  . Modafinil Rash    Under breasts only.      Past Medical History  Diagnosis Date  . HYPOTHYROIDISM 10/06/2008  . HYPERLIPIDEMIA 10/06/2008  . DEPRESSION 02/23/2010  . ALLERGIC RHINITIS 07/23/2009  . Diverticulosis of colon 11/10/2008  . 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 Coumadin-all resolved. no coumadin since 11/09/11  . Pneumonia 11-17-11    1'13 postop pneumonia.   Marland Kitchen HYPERTENSION 10/06/2008    EKG 12/12 EPIC  . 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 Hospital-acquired pneumonia 05/28/2011  . Asthma 11-17-11    no flareups in adult yrs. Albuterol used rarely  . Chronic kidney disease 11-17-11    hx. past multiple kidney stones, prev. lithos  . Arthritis     osteoarthritis  . Anemia     no recently    Past Surgical History  Procedure Laterality Date  . Thyroid surgery  1982    tumor removed  . Cesarean section  1983  . Rotator cuff repair  11-17-11    left '05  . Kidney stone surgery  2000, 2009, 2010  . Laparoscopic assissted total colectomy w/ j-pouch  03/01/11  . Ileostomy closure  05/02/2011    ileosomy takedown  . Laparotomy  05/14/2011    small bowel resection, ileostomy  . Abdominal hysterectomy   1986    fibroids  . Eye surgery      lasik  bilaterally  . Appendectomy    . Colon surgery  12/12    ileostomy   . Ileo loop colostomy closure  01/18/2012    Procedure: LAPAROSCOPIC ILEO LOOP COLOSTOMY CLOSURE;  Surgeon: Adolph Pollack, MD;  Location: [facility];  Service: General;  Laterality: N/A;  Laparoscopic Ileostomy Closure , lysis of adhesions   . Cystoscopy w/ ureteral stent placement Right 02/09/2013    Procedure: CYSTOSCOPY WITH RETROGRADE PYELOGRAM/URETERAL STENT PLACEMENT;  Surgeon: Antony Haste, MD;  Location: [facility];  Service: Urology;  Laterality: Right;    Prior to Admission medications   Medication Sig Start Date End Date Taking? Authorizing Provider  albuterol (PROVENTIL HFA;VENTOLIN HFA) 108 (90 BASE) MCG/ACT inhaler Inhale 2 puffs into the lungs every 4 (four) hours as needed for wheezing. 09/20/12  Yes Kristian Covey, MD  ferrous gluconate (FERGON) 225 (27 FE) MG tablet Take 240 mg by mouth daily.   Yes Historical Provider, MD  ibuprofen (ADVIL,MOTRIN) 200 MG tablet Take 800 mg by mouth every 6 (six) hours as needed. pain   Yes Historical Provider, MD  levothyroxine (SYNTHROID, LEVOTHROID) 175 MCG tablet take 1 tablet by mouth once daily 12/29/12  Yes Kristian Covey, MD  loratadine (CLARITIN) 10 MG tablet Take 10 mg by mouth daily.   Yes Historical Provider, MD  losartan (COZAAR) 50 MG tablet Take 50  mg by mouth daily before breakfast.   Yes Historical Provider, MD  montelukast (SINGULAIR) 10 MG tablet Take 10 mg by mouth at bedtime.  12/07/10  Yes Kristian Covey, MD  Multiple Vitamin (MULTIVITAMIN) tablet Take 1 tablet by mouth daily.   Yes Historical Provider, MD  Omega-3 Fatty Acids (FISH OIL) 1000 MG CAPS Take 1 capsule by mouth daily.   Yes Historical Provider, MD  oxyCODONE-acetaminophen (ROXICET) 5-325 MG per tablet Take 1 tablet by mouth every 4 (four) hours as needed for pain. 09/12/12  Yes Anner Crete, MD  PSYLLIUM HUSK PO Take 3.12 g by mouth  daily.   Yes Historical Provider, MD  sertraline (ZOLOFT) 100 MG tablet Take 150 mg by mouth daily. Takes one and one half tablet to equal 150mg    Yes Historical Provider, MD  vitamin A 8000 UNIT capsule Take 8,000 Units by mouth daily.   Yes Historical Provider, MD  zinc gluconate 50 MG tablet Take 50 mg by mouth daily.   Yes Historical Provider, MD  zolpidem (AMBIEN) 10 MG tablet take 1 tablet by mouth at bedtime if needed 12/03/12  Yes Kristian Covey, MD  cephALEXin (KEFLEX) 500 MG capsule Take 1 capsule (500 mg total) by mouth 3 (three) times daily. 02/09/13   Antony Haste, MD  docusate sodium (COLACE) 100 MG capsule Take 1 capsule (100 mg total) by mouth 2 (two) times daily. 02/09/13   Antony Haste, MD  oxyCODONE-acetaminophen (ROXICET) 5-325 MG per tablet Take 1 tablet by mouth every 4 (four) hours as needed for pain. 02/09/13   Antony Haste, MD    Social History:  reports that she quit smoking about 32 years ago. Her smoking use included Cigarettes. She has a 10 pack-year smoking history. She has never used smokeless tobacco. She reports that she does not drink alcohol or use illicit drugs.  Family History  Problem Relation Age of Onset  . Arthritis Other   . Mental illness Other   . COPD Mother   . Depression Mother   . Deep vein thrombosis Father   . Depression Father      All the positives are listed in BOLD  Review of Systems:  HEENT: Headache, blurred vision, runny nose, sore throat Neck: Hypothyroidism, hyperthyroidism,,lymphadenopathy Chest : Shortness of breath, history of COPD, Asthma Heart : Chest pain, history of coronary arterey disease GI:  Nausea, vomiting, diarrhea, constipation, GERD GU: Dysuria, urgency, frequency of urination, hematuria Neuro: Stroke, seizures, syncope Psych: Depression, anxiety, hallucinations   Physical Exam: Blood pressure 103/59, pulse 98, temperature 98.4 F (36.9 C), temperature source Oral, resp.  rate 16, height 5\' 8"  (1.727 m), weight 86.8 kg (191 lb 5.8 oz), SpO2 94.00%. Constitutional:   Patient is a well-developed and well-nourished female  in no acute distress and cooperative with exam. Head: Normocephalic and atraumatic Mouth: Mucus membranes moist Eyes: PERRL, EOMI, conjunctivae normal Neck: Supple, No Thyromegaly Cardiovascular: RRR, S1 normal, S2 normal Pulmonary/Chest: faint crackles in right upper lung fields Abdominal: Soft. Non-tender, non-distended, bowel sounds are normal, no masses, organomegaly, or guarding present.  Neurological: A&O x3, Strenght is normal and symmetric bilaterally, cranial nerve II-XII are grossly intact, no focal motor deficit, sensory intact to light touch bilaterally.  Extremities : No Cyanosis, Clubbing or Edema   Labs on Admission:  Results for orders placed during the hospital encounter of 02/09/13 (from the past 48 hour(s))  CBC WITH DIFFERENTIAL     Status: Abnormal   Collection Time  02/09/13  2:58 AM      Result Value Range   WBC 11.1 (*) 4.0 - 10.5 K/uL   RBC 3.62 (*) 3.87 - 5.11 MIL/uL   Hemoglobin 10.6 (*) 12.0 - 15.0 g/dL   HCT 09.8 (*) 11.9 - 14.7 %   MCV 88.1  78.0 - 100.0 fL   MCH 29.3  26.0 - 34.0 pg   MCHC 33.2  30.0 - 36.0 g/dL   RDW 82.9  56.2 - 13.0 %   Platelets 169  150 - 400 K/uL   Neutrophils Relative % 87 (*) 43 - 77 %   Neutro Abs 9.7 (*) 1.7 - 7.7 K/uL   Lymphocytes Relative 8 (*) 12 - 46 %   Lymphs Abs 0.9  0.7 - 4.0 K/uL   Monocytes Relative 5  3 - 12 %   Monocytes Absolute 0.6  0.1 - 1.0 K/uL   Eosinophils Relative 0  0 - 5 %   Eosinophils Absolute 0.0  0.0 - 0.7 K/uL   Basophils Relative 0  0 - 1 %   Basophils Absolute 0.0  0.0 - 0.1 K/uL  BASIC METABOLIC PANEL     Status: Abnormal   Collection Time    02/09/13  2:58 AM      Result Value Range   Sodium 138  135 - 145 mEq/L   Potassium 3.8  3.5 - 5.1 mEq/L   Chloride 102  96 - 112 mEq/L   CO2 22  19 - 32 mEq/L   Glucose, Bld 99  70 - 99 mg/dL    BUN 17  6 - 23 mg/dL   Creatinine, Ser 8.65 (*) 0.50 - 1.10 mg/dL   Calcium 78.4  8.4 - 69.6 mg/dL   GFR calc non Af Amer 44 (*) >90 mL/min   GFR calc Af Amer 51 (*) >90 mL/min   Comment: (NOTE)     The eGFR has been calculated using the CKD EPI equation.     This calculation has not been validated in all clinical situations.     eGFR's persistently <90 mL/min signify possible Chronic Kidney     Disease.  URINALYSIS, ROUTINE W REFLEX MICROSCOPIC     Status: Abnormal   Collection Time    02/09/13  3:09 AM      Result Value Range   Color, Urine YELLOW  YELLOW   APPearance TURBID (*) CLEAR   Specific Gravity, Urine 1.019  1.005 - 1.030   pH 5.5  5.0 - 8.0   Glucose, UA NEGATIVE  NEGATIVE mg/dL   Hgb urine dipstick LARGE (*) NEGATIVE   Bilirubin Urine NEGATIVE  NEGATIVE   Ketones, ur NEGATIVE  NEGATIVE mg/dL   Protein, ur 30 (*) NEGATIVE mg/dL   Urobilinogen, UA 0.2  0.0 - 1.0 mg/dL   Nitrite NEGATIVE  NEGATIVE   Leukocytes, UA LARGE (*) NEGATIVE  URINE MICROSCOPIC-ADD ON     Status: Abnormal   Collection Time    02/09/13  3:09 AM      Result Value Range   Squamous Epithelial / LPF RARE  RARE   WBC, UA TOO NUMEROUS TO COUNT  <3 WBC/hpf   RBC / HPF 11-20  <3 RBC/hpf   Bacteria, UA MANY (*) RARE   Urine-Other MICROSCOPIC EXAM PERFORMED ON UNCONCENTRATED URINE    URINE CULTURE     Status: None   Collection Time    02/09/13  3:09 AM      Result Value Range   Specimen Description  URINE, RANDOM     Special Requests NONE     Culture  Setup Time       Value: 02/09/2013 15:23     Performed at [facility] Count       Value: >=100,000 COLONIES/ML     Performed at [facility]   Culture       Value: ESCHERICHIA COLI     Performed at [facility]   Report Status PENDING    MRSA PCR SCREENING     Status: None   Collection Time    02/09/13 12:06 PM      Result Value Range   MRSA by PCR NEGATIVE  NEGATIVE   Comment:            The GeneXpert  MRSA Assay (FDA     approved for NASAL specimens     only), is one component of a     comprehensive MRSA colonization     surveillance program. It is not     intended to diagnose MRSA     infection nor to guide or     monitor treatment for     MRSA infections.  CBC     Status: Abnormal   Collection Time    02/10/13  4:23 AM      Result Value Range   WBC 12.1 (*) 4.0 - 10.5 K/uL   RBC 2.93 (*) 3.87 - 5.11 MIL/uL   Hemoglobin 8.4 (*) 12.0 - 15.0 g/dL   Comment: REPEATED TO VERIFY     DELTA CHECK NOTED   HCT 26.3 (*) 36.0 - 46.0 %   MCV 89.8  78.0 - 100.0 fL   MCH 28.7  26.0 - 34.0 pg   MCHC 31.9  30.0 - 36.0 g/dL   RDW 16.1  09.6 - 04.5 %   Platelets 120 (*) 150 - 400 K/uL   Comment: SPECIMEN CHECKED FOR CLOTS     REPEATED TO VERIFY     DELTA CHECK NOTED  BASIC METABOLIC PANEL     Status: Abnormal   Collection Time    02/10/13  4:23 AM      Result Value Range   Sodium 136  135 - 145 mEq/L   Potassium 4.2  3.5 - 5.1 mEq/L   Chloride 105  96 - 112 mEq/L   CO2 21  19 - 32 mEq/L   Glucose, Bld 169 (*) 70 - 99 mg/dL   BUN 23  6 - 23 mg/dL   Creatinine, Ser 4.09 (*) 0.50 - 1.10 mg/dL   Calcium 8.7  8.4 - 81.1 mg/dL   GFR calc non Af Amer 39 (*) >90 mL/min   GFR calc Af Amer 45 (*) >90 mL/min   Comment: (NOTE)     The eGFR has been calculated using the CKD EPI equation.     This calculation has not been validated in all clinical situations.     eGFR's persistently <90 mL/min signify possible Chronic Kidney     Disease.  LACTIC ACID, PLASMA     Status: None   Collection Time    02/10/13  9:40 AM      Result Value Range   Lactic Acid, Venous 1.1  0.5 - 2.2 mmol/L    Radiological Exams on Admission: Ct Abdomen Pelvis Wo Contrast  02/09/2013   CLINICAL DATA:  Right flank pain.  EXAM: CT ABDOMEN AND PELVIS WITHOUT CONTRAST  TECHNIQUE: Multidetector CT imaging of the abdomen and pelvis  was performed following the standard protocol without intravenous contrast.  COMPARISON:   07/04/2012.  FINDINGS: BODY WALL: Lower ventral abdominal wall hernia, wide necked and containing nonobstructed bowel.  LOWER CHEST:  Mediastinum: Unremarkable.  Lungs/pleura: No consolidation.  ABDOMEN/PELVIS:  Liver: 3 benign cysts within the liver are unchanged from prior, the largest in the upper right lobe, 2.7 x 2 cm axial span.  Biliary: Distended without a radiodense stone or pericholecystic inflammation.  Pancreas: Unremarkable.  Spleen: Unremarkable.  Adrenals: Unremarkable.  Kidneys and ureters: 3 mm mid right ureteral calculus with mild pelviectasis. Interval migration of the largest right stone, 7 mm in diameter, now within the right renal pelvis. There is urothelial thickening on the right, affecting the pelvis. Punctate left nephrolithiasis, interpolar.  Bladder: Unremarkable.  Bowel: Subtotal colectomy. No bowel obstruction.  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  Peritoneum: No free fluid or gas.  Reproductive: Hysterectomy. Gas present within the upper vaginal canal, nonspecific. There is a relatively discrete plane between the bladder and vagina.  Vascular: No acute abnormality.  OSSEOUS: No acute abnormalities. No suspicious lytic or blastic lesions.  IMPRESSION: 1. 3 mm mid right ureteral calculus with mild pelviectasis. 2. Right urothelial thickening which may be reactive to the stones, or represent infection/inflammation given history of fever. 3. Interval migration of a 7 mm right renal stone, now located in the renal pelvis. 4. Punctate left nephrolithiasis.   Electronically Signed   By: Tiburcio Pea   On: 02/09/2013 05:41   Dg Chest 2 View  02/10/2013   CLINICAL DATA:  Severe headache.  EXAM: CHEST  2 VIEW  COMPARISON:  01/12/2012  FINDINGS: Linear subsegmental atelectasis in the left mid lung. Heart is borderline enlarged. No effusions. No acute bony abnormality.  IMPRESSION: Mild cardiomegaly. Left mid  lung atelectasis.   Electronically Signed   By: Charlett Nose M.D.   On: 02/10/2013 13:10    Assessment/Plan  Sepsis  Hypotension  AK I.   Patient has hypotension due to sepsis to UTI  Recommend changing the antibiotics to Zosyn for broad coverage the urine culture results are back  We'll also obtain lactic acid level and obtain chest x-ray  Will continue the normal saline at 150 per hour  We'll transfer to step down unit for closer monitoring     Family discussion:Discussed with husband at bedside    Time Spent on Admission: 65 min  LAMA,GAGAN S [facility] Pager: [phone] 02/10/2013, 5:28 PM  If 7PM-7AM, please contact night-coverage  [email or web address]  Password [email or web address]

## 2013-02-11 LAB — CBC
MCH: 29.2 pg (ref 26.0–34.0)
MCHC: 32.5 g/dL (ref 30.0–36.0)
MCV: 89.7 fL (ref 78.0–100.0)
Platelets: 126 10*3/uL — ABNORMAL LOW (ref 150–400)
RDW: 15.6 % — ABNORMAL HIGH (ref 11.5–15.5)

## 2013-02-11 LAB — URINE CULTURE

## 2013-02-11 LAB — BASIC METABOLIC PANEL
CO2: 23 mEq/L (ref 19–32)
Calcium: 9.4 mg/dL (ref 8.4–10.5)
Creatinine, Ser: 1.13 mg/dL — ABNORMAL HIGH (ref 0.50–1.10)
GFR calc Af Amer: 60 mL/min — ABNORMAL LOW (ref >90)

## 2013-02-11 MED ORDER — CEPHALEXIN 500 MG PO CAPS
500.0000 mg | ORAL_CAPSULE | Freq: Two times a day (BID) | ORAL | Status: DC
  Administered 2013-02-11: 500 mg via ORAL
  Filled 2013-02-11 (×2): qty 1

## 2013-02-11 NOTE — Discharge Summary (Signed)
Physician Discharge Summary  Patient ID: [name] MRN: [id number] DOB/AGE: [birth year] 62 y.o.  Admit date: 02/09/2013 Discharge date: 02/11/2013  Admission Diagnoses: UTI Sepsis Right nephrolithiasis Right ureteral stone  Discharge Diagnoses:  UTI Sepsis Right nephrolithiasis Right ureteral stone   Discharged Condition: good  Hospital Course: Admitted s/p cystoscopy, right ureteral stent placement. Maintained on IV abx and IVF. POD#2 she is AFVSS with stable vitals and switched over to po abx tolerating a regular diet. She has felt well today and I just spoke with nurse and pt is feeling well, voiding without foley and ready for discharge. Urine grew pansensitive e coli.  Consults: Hospitalist  Significant Diagnostic Studies: None  Treatments: surgery: cystoscopy, right retrograde pyelogram, right ureteral stent placement  Discharge Exam: Blood pressure 111/62, pulse 73, temperature 98.8 F (37.1 C), temperature source Oral, resp. rate 18, height 5\' 8"  (1.727 m), weight 86.8 kg (191 lb 5.8 oz), SpO2 93.00%. See AM progress note  Disposition: 01-Home or Self Care     Medication List         albuterol 108 (90 BASE) MCG/ACT inhaler  Commonly known as:  PROVENTIL HFA;VENTOLIN HFA  Inhale 2 puffs into the lungs every 4 (four) hours as needed for wheezing.     cephALEXin 500 MG capsule  Commonly known as:  KEFLEX  Take 1 capsule (500 mg total) by mouth 3 (three) times daily.     docusate sodium 100 MG capsule  Commonly known as:  COLACE  Take 1 capsule (100 mg total) by mouth 2 (two) times daily.     ferrous gluconate 225 (27 FE) MG tablet  Commonly known as:  FERGON  Take 240 mg by mouth daily.     Fish Oil 1000 MG Caps  Take 1 capsule by mouth daily.     ibuprofen 200 MG tablet  Commonly known as:  ADVIL,MOTRIN  Take 800 mg by mouth every 6 (six) hours as needed. pain     levothyroxine 175 MCG tablet  Commonly known as:  SYNTHROID, LEVOTHROID   take 1 tablet by mouth once daily     loratadine 10 MG tablet  Commonly known as:  CLARITIN  Take 10 mg by mouth daily.     losartan 50 MG tablet  Commonly known as:  COZAAR  Take 50 mg by mouth daily before breakfast.     multivitamin tablet  Take 1 tablet by mouth daily.     oxyCODONE-acetaminophen 5-325 MG per tablet  Commonly known as:  ROXICET  Take 1 tablet by mouth every 4 (four) hours as needed for pain.     oxyCODONE-acetaminophen 5-325 MG per tablet  Commonly known as:  ROXICET  Take 1 tablet by mouth every 4 (four) hours as needed for pain.     PSYLLIUM HUSK PO  Take 3.12 g by mouth daily.     sertraline 100 MG tablet  Commonly known as:  ZOLOFT  Take 150 mg by mouth daily. Takes one and one half tablet to equal 150mg      SINGULAIR 10 MG tablet  Generic drug:  montelukast  Take 10 mg by mouth at bedtime.     vitamin A 8000 UNIT capsule  Take 8,000 Units by mouth daily.     zinc gluconate 50 MG tablet  Take 50 mg by mouth daily.     zolpidem 10 MG tablet  Commonly known as:  AMBIEN  take 1 tablet by mouth at bedtime if needed  Follow-up Information   Follow up with Anner Crete, MD In 3 weeks.   Specialty:  Urology   Contact information:   [address] [place] [address] [phone]       Signed: Antony Haste 02/11/2013, 2:41 PM

## 2013-02-11 NOTE — Progress Notes (Signed)
Subjective: Patient seen and examined, BP is better. Denies shortness of breath. Has been afebrile. Urine culture resulted and she has E coli sensitive to cefazolin.  Objective: Vital signs in last 24 hours: Temp:  [98.1 F (36.7 C)-99 F (37.2 C)] 98.8 F (37.1 C) (09/16 0621) Pulse Rate:  [92-103] 103 (09/16 0621) Resp:  [16-18] 16 (09/16 0621) BP: (103-112)/(57-68) 112/68 mmHg (09/16 0621) SpO2:  [89 %-96 %] 93 % (09/16 0700) Weight change:  Last BM Date: 02/10/13   Antibiotics  Zosyn 9/15 >>   Intake/Output from previous day: 09/15 0701 - 09/16 0700 In: 6280 [P.O.:600; I.V.:5530; IV Piggyback:150] Out: 1427 [Urine:1425; Stool:2]     Physical Exam: Head: Normocephalic, atraumatic.  Eyes: No signs of jaundice, EOMI Nose: Mucous membranes dry.  Throat: Oropharynx nonerythematous, no exudate appreciated.  Neck: supple,No deformities, masses, or tenderness noted. Lungs: Normal respiratory effort. B/L Clear to auscultation, no crackles or wheezes.  Heart: Regular RR. S1 and S2 normal  Abdomen: BS normoactive. Soft, Nondistended, non-tender.  Extremities: No pretibial edema, no erythema   Lab Results: Basic Metabolic Panel:  Recent Labs  29/56/21 0423 02/11/13 0400  NA 136 139  K 4.2 3.9  CL 105 108  CO2 21 23  GLUCOSE 169* 122*  BUN 23 24*  CREATININE 1.44* 1.13*  CALCIUM 8.7 9.4   Liver Function Tests: No results found for this basename: AST, ALT, ALKPHOS, BILITOT, PROT, ALBUMIN,  in the last 72 hours No results found for this basename: LIPASE, AMYLASE,  in the last 72 hours No results found for this basename: AMMONIA,  in the last 72 hours CBC:  Recent Labs  02/09/13 0258 02/10/13 0423 02/11/13 0400  WBC 11.1* 12.1* 9.3  NEUTROABS 9.7*  --   --   HGB 10.6* 8.4* 8.2*  HCT 31.9* 26.3* 25.2*  MCV 88.1 89.8 89.7  PLT 169 120* 126*   Cardiac Enzymes: No results found for this basename: CKTOTAL, CKMB, CKMBINDEX, TROPONINI,  in the last 72  hours BNP: No results found for this basename: PROBNP,  in the last 72 hours D-Dimer: No results found for this basename: DDIMER,  in the last 72 hours CBG:  Recent Labs  02/10/13 2302  GLUCAP 122*   Hemoglobin A1C: No results found for this basename: HGBA1C,  in the last 72 hours Fasting Lipid Panel: No results found for this basename: CHOL, HDL, LDLCALC, TRIG, CHOLHDL, LDLDIRECT,  in the last 72 hours Thyroid Function Tests: No results found for this basename: TSH, T4TOTAL, FREET4, T3FREE, THYROIDAB,  in the last 72 hours Anemia Panel: No results found for this basename: VITAMINB12, FOLATE, FERRITIN, TIBC, IRON, RETICCTPCT,  in the last 72 hours Coagulation: No results found for this basename: LABPROT, INR,  in the last 72 hours Urine Drug Screen: Drugs of Abuse  No results found for this basename: labopia, cocainscrnur, labbenz, amphetmu, thcu, labbarb    Alcohol Level: No results found for this basename: ETH,  in the last 72 hours Urinalysis:  Recent Labs  02/09/13 0309  COLORURINE YELLOW  LABSPEC 1.019  PHURINE 5.5  GLUCOSEU NEGATIVE  HGBUR LARGE*  BILIRUBINUR NEGATIVE  KETONESUR NEGATIVE  PROTEINUR 30*  UROBILINOGEN 0.2  NITRITE NEGATIVE  LEUKOCYTESUR LARGE*   Misc. Labs:  Recent Results (from the past 240 hour(s))  URINE CULTURE     Status: None   Collection Time    02/09/13  3:09 AM      Result Value Range Status   Specimen Description URINE, RANDOM  Final   Special Requests NONE   Final   Culture  Setup Time     Final   Value: 02/09/2013 15:23     Performed at [facility] Count     Final   Value: >=100,000 COLONIES/ML     Performed at [facility]   Culture     Final   Value: ESCHERICHIA COLI     Performed at [facility]   Report Status 02/11/2013 FINAL   Final   Organism ID, Bacteria ESCHERICHIA COLI   Final  MRSA PCR SCREENING     Status: None   Collection Time    02/09/13 12:06 PM      Result Value  Range Status   MRSA by PCR NEGATIVE  NEGATIVE Final   Comment:            The GeneXpert MRSA Assay (FDA     approved for NASAL specimens     only), is one component of a     comprehensive MRSA colonization     surveillance program. It is not     intended to diagnose MRSA     infection nor to guide or     monitor treatment for     MRSA infections.  CULTURE, BLOOD (ROUTINE X 2)     Status: None   Collection Time    02/10/13  9:40 AM      Result Value Range Status   Specimen Description BLOOD LEFT ARM   Final   Special Requests BOTTLES DRAWN AEROBIC AND ANAEROBIC [facility]   Final   Culture  Setup Time     Final   Value: 02/10/2013 14:49     Performed at [facility]   Culture     Final   Value:        BLOOD CULTURE RECEIVED NO GROWTH TO DATE CULTURE WILL BE HELD FOR 5 DAYS BEFORE ISSUING A FINAL NEGATIVE REPORT     Performed at [facility]   Report Status PENDING   Incomplete  CULTURE, BLOOD (ROUTINE X 2)     Status: None   Collection Time    02/10/13  9:50 AM      Result Value Range Status   Specimen Description BLOOD LEFT HAND   Final   Special Requests BOTTLES DRAWN AEROBIC ONLY 2CC   Final   Culture  Setup Time     Final   Value: 02/10/2013 14:50     Performed at [facility]   Culture     Final   Value:        BLOOD CULTURE RECEIVED NO GROWTH TO DATE CULTURE WILL BE HELD FOR 5 DAYS BEFORE ISSUING A FINAL NEGATIVE REPORT     Performed at [facility]   Report Status PENDING   Incomplete    Studies/Results: Dg Chest 2 View  02/10/2013   CLINICAL DATA:  Severe headache.  EXAM: CHEST  2 VIEW  COMPARISON:  01/12/2012  FINDINGS: Linear subsegmental atelectasis in the left mid lung. Heart is borderline enlarged. No effusions. No acute bony abnormality.  IMPRESSION: Mild cardiomegaly. Left mid lung atelectasis.   Electronically Signed   By: Charlett Nose M.D.   On: 02/10/2013 13:10    Medications: Scheduled Meds: . cephALEXin  500 mg Oral Q12H  .  docusate sodium  100 mg Oral BID  . ferrous gluconate  324 mg Oral Q breakfast  . influenza vac split quadrivalent PF  0.5 mL Intramuscular Tomorrow-1000  . levothyroxine  175 mcg Oral QAC breakfast  . loratadine  10 mg Oral Daily  . montelukast  10 mg Oral QHS  . sertraline  150 mg Oral QHS   Continuous Infusions:  PRN Meds:.acetaminophen, acetaminophen, albuterol, diphenhydrAMINE, diphenhydrAMINE, fentaNYL, HYDROmorphone (DILAUDID) injection, hyoscyamine, ondansetron, oxyCODONE-acetaminophen, zolpidem  Assessment/Plan:  Active Problems:   Hypotension, unspecified UTI  Hypotension- resolved, secondary to sepsis due to UTI. Will saline lock and monitor the BP. UTI- Will change the antibiotics to Po keflex.  Will sign off, Called and discussed with Dr Gearldine Shown.  :   LOS: 2 days    [facility] S [facility] Pager: [phone] 02/11/2013, 9:55 AM

## 2013-02-11 NOTE — Progress Notes (Signed)
Patient stated that she no longer had a headache. Patient is resting well.  Will continue to monitor the patient.

## 2013-02-11 NOTE — Progress Notes (Signed)
Patient ID: [name], female   DOB: 1950/10/05, 62 y.o.   MRN: [id number]   Pt c/o HA last night, better today. Room is dark and she is comfortable.   PE: NAD Resting in bed Neuro: CN II - XII intact, PER, no focal deficits. CV - RRR Pulm -  Regular effort, depth Abd - soft, NT Ext - no CCE  Imp - sepsis - resolved E coli UTI - on keflex Ureteral and renal stone - right - s/p ureteral stent D/c foley, maybe home later today. Pt feeling well and wants to go home.

## 2013-02-16 LAB — CULTURE, BLOOD (ROUTINE X 2): Culture: NO GROWTH

## 2013-02-28 ENCOUNTER — Encounter (HOSPITAL_COMMUNITY): Payer: Self-pay | Admitting: Pharmacy Technician

## 2013-02-28 NOTE — Progress Notes (Signed)
Need orders in EPIC.  Surgery scheduled for 03/04/13.  Preop on 03/03/13 at 1130am.  Thank You.

## 2013-03-03 ENCOUNTER — Encounter (HOSPITAL_COMMUNITY): Payer: Self-pay

## 2013-03-03 ENCOUNTER — Encounter (HOSPITAL_COMMUNITY)
Admission: RE | Admit: 2013-03-03 | Discharge: 2013-03-03 | Disposition: A | Discharge status: Home / Self Care | Payer: BC Managed Care – PPO | Source: Ambulatory Visit | Attending: Urology | Admitting: Urology

## 2013-03-03 ENCOUNTER — Other Ambulatory Visit: Payer: Self-pay | Admitting: Urology

## 2013-03-03 ENCOUNTER — Other Ambulatory Visit: Payer: Self-pay

## 2013-03-03 LAB — CBC
HCT: 33.9 % — ABNORMAL LOW (ref 36.0–46.0)
MCV: 89.2 fL (ref 78.0–100.0)
RBC: 3.8 MIL/uL — ABNORMAL LOW (ref 3.87–5.11)
WBC: 6.4 10*3/uL (ref 4.0–10.5)

## 2013-03-03 LAB — BASIC METABOLIC PANEL
BUN: 21 mg/dL (ref 6–23)
CO2: 26 mEq/L (ref 19–32)
Chloride: 104 mEq/L (ref 96–112)
Creatinine, Ser: 0.96 mg/dL (ref 0.50–1.10)
GFR calc Af Amer: 73 mL/min — ABNORMAL LOW (ref >90)
Glucose, Bld: 105 mg/dL — ABNORMAL HIGH (ref 70–99)
Potassium: 4.4 mEq/L (ref 3.5–5.1)

## 2013-03-03 NOTE — Patient Instructions (Addendum)
20 [name]  03/03/2013   Your procedure is scheduled on: 10-7  -2014  Report to [facility] at    0730    AM .  Call this number if you have problems the morning of surgery: [phone]  Or Presurgical Testing [phone]([name])      Do not eat food:After Midnight.    Take these medicines the morning of surgery with A SIP OF WATER: Levothyroxine. Loratadine. Oxycodone(if needed). Bring/use Albuterol if needed.   Do not wear jewelry, make-up or nail polish.  Do not wear lotions, powders, or perfumes. You may wear deodorant.  Do not shave 12 hours prior to first CHG shower(legs and under arms).(face and neck okay.)  Do not bring valuables to the hospital.  Contacts, dentures or bridgework,body piercing,  may not be worn into surgery.  Leave suitcase in the car. After surgery it may be brought to your room.  For patients admitted to the hospital, checkout time is 11:00 AM the day of discharge.   Patients discharged the day of surgery will not be allowed to drive home. Must have responsible person with you x 24 hours once discharged.  Name and phone number of your driver: Charles"Chuck"- spouse [phone] cell  Special Instructions: CHG(Chlorhedine 4%-"Hibiclens","Betasept","Aplicare") Shower Use Special Wash: see special instructions.(avoid face and genitals)      Failure to follow these instructions may result in Cancellation of your surgery.   Patient signature_______________________________________________________

## 2013-03-03 NOTE — Pre-Procedure Instructions (Signed)
03-03-13 EKG done today. CXR 9'14-Epic.

## 2013-03-03 NOTE — Progress Notes (Signed)
03-03-13 1150 Pt. Here now for PAT visit need MD orders in Epic please. Will do labs per anesthesia protocol. W. Kennon Portela

## 2013-03-03 NOTE — H&P (Signed)
History of Present Illness     Patient follows up from right pyelonephritis and sepsis due to a 3 mm right proximal ureteral stone and a 7 mm right renal pelvic stone September 2014. She was taken for cystoscopy and right ureteral stent. Urine culture grew pansensitive Escherichia coli and she was discharged on cephalexin.  She has been well without recurrent fever. She has some mild symptoms from the stent such as frequency and urgency but she is not having any dysuria.  Chest a past history of stones in the left shockwave lithotripsy 4/17. Her stone was mixed uric acid and calcium oxalate.  She was on [place] K since the urocit tablets were too big.   Past Medical History Problems  1. History of  Anxiety (Symptom) 300.00 2. History of  Arthritis V13.4 3. History of  Asthma 493.90 4. History of  Colonic Diverticulosis 562.10 5. History of  Depression 311 6. History of  Heartburn 787.1 7. History of  Hydronephrosis Left 591 8. History of  Hypercholesterolemia 272.0 9. History of  Hypertension 401.9 10. History of  Hypothyroidism 244.9 11. History of  Lumbago 724.2 12. History of  Nephrolithiasis V13.01 13. History of  Nephrolithiasis Of The Right Kidney V13.01 14. History of  Proximal Ureteral Stone On The Left 592.1 15. History of  Proximal Ureteral Stone On The Left 592.1 16. History of  Pyuria 791.9 17. History of  Restless Legs Syndrome 333.94 18. History of  Thyroid Cancer V10.87 19. Tobacco Use V15.82 20. History of  Ureteral Stone Left 592.1 21. History of  Urinary Tract Infection V13.02  Surgical History Problems  1. History of  Cesarean Section 2. History of  Cystoscopy With Insertion Of Ureteral Stent Left 3. History of  Cystoscopy With Insertion Of Ureteral Stent Left 4. History of  Cystoscopy With Ureteroscopy Left 5. History of  Hysterectomy 6. History of  Ileostomy Revision 7. History of  Lithotripsy 8. History of  Lithotripsy 9. History of  Lithotripsy 10.  History of  Lithotripsy 11. History of  Thyroid Surgery 12. History of  Total Abdominal Colectomy With Ileostomy  Current Meds 1. Ambien 10 MG Oral Tablet; Therapy: (Recorded:27Jun2008) to 2. Cephalexin 500 MG Oral Capsule; TAKE 1 CAPSULE 3 TIMES DAILY; Therapy: 16Sep2014 to  (Evaluate:26Sep2014)  Requested for: 16Sep2014; Last Rx:16Sep2014 3. Cozaar 50 MG Oral Tablet; Therapy: (Recorded:27Jun2008) to 4. Cytra K Crystals 3300-1002 MG Oral Packet; USE AS DIRECTED; Therapy: 12May2014 to (Last  Rx:12May2014)  Requested for: 12May2014 5. Hydrocodone-Acetaminophen 5-325 MG Oral Tablet; TAKE 1 TO 2 TABLETS EVERY 4 TO 6  HOURS AS NEEDED FOR PAIN; Therapy: 16Sep2014 to (Evaluate:16Oct2014); Last  Rx:16Sep2014 6. Iron TABS; Therapy: (Recorded:02Apr2014) to 7. Oxycodone-Acetaminophen 5-325 MG Oral Tablet; TAKE 1 TABLET Every 8 hours; Therapy:  14May2013 to (Evaluate:16Sep2014); Last Rx:12Sep2014 8. Proventil HFA 108 (90 Base) MCG/ACT Inhalation Aerosol Solution; Therapy: 23Dec2010 to 9. Sertraline HCl 100 MG Oral Tablet; Therapy: 09Aug2010 to 10. Singulair TABS; Therapy: (Recorded:26Apr2010) to 11. Synthroid 137 MCG Oral Tablet; Therapy: (Recorded:30Jul2013) to 12. Vitamin A TABS; Therapy: (Recorded:02Apr2014) to 13. Vitamin D (Ergocalciferol) 50000 UNIT Oral Capsule; Therapy: 05Apr2013 to  Allergies Medication  1. Provigil TABS  Family History Problems  1. Family history of  Death In The Family Father 2. Family history of  Death In The Family Mother Deceased at age 31; lung disease 3. Family history of  Family Health Status Number Of Children 1 son  1 daughter 4. Maternal history of  Pulmonary Disease  Social History  Problems  1. Caffeine Use 2 cups in the am 2. Marital History - Currently Married 3. 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 History of  Alcohol Use  Vitals Vital Signs [Data Includes: Last 1 Day]  30Sep2014 02:00PM  Blood Pressure: 128 /  77 Temperature: 99 F Heart Rate: 104  Physical Exam Constitutional: Well nourished and well developed . No acute distress.  Pulmonary: No respiratory distress and normal respiratory rhythm and effort.  Cardiovascular: Heart rate and rhythm are normal . No peripheral edema.  Neuro/Psych:. Mood and affect are appropriate.    Results/Data Urine [Data Includes: Last 1 Day]   30Sep2014  COLOR AMBER   APPEARANCE CLOUDY   SPECIFIC GRAVITY 1.025   pH 5.5   GLUCOSE NEG mg/dL  BILIRUBIN NEG   KETONE NEG mg/dL  BLOOD LARGE   PROTEIN 100 mg/dL  UROBILINOGEN 0.2 mg/dL  NITRITE NEG   LEUKOCYTE ESTERASE MOD   SQUAMOUS EPITHELIAL/HPF MODERATE   WBC 21-50 WBC/hpf  RBC TNTC RBC/hpf  BACTERIA FEW   CRYSTALS Calcium Oxalate crystals noted   CASTS NONE SEEN    Old records or history reviewed:Marland Kitchen  The following images/tracing/specimen were independently visualized: Marland Kitchen    Procedure  KUB today-comparison to CT September 2014, CT March 2012, findings: There is a right stent in good position. There is a large stone sitting under the proximal coil of the right stent. Because of the patient's colectomy and the kidneys are visualized very well. The bowel gas pattern appears normal in light of the patient's anatomy. The bones appear normal.     Assessment Assessed  1. Nephrolithiasis 592.0  Plan Health Maintenance (V70.0)  1. UA With REFLEX  Done: 30Sep2014 01:43PM Nephrolithiasis (592.0)  2. URINE CULTURE  Requested for: 30Sep2014 3. Follow-up Schedule Surgery Office  Follow-up  Done: 30Sep2014  Discussion/Summary        I discussed with the patient in the nature risks and benefits of simply removing the stent in the office today, proceeding with ureteroscopy or scheduling shockwave lithotripsy. She is familiar with these procedures having undergone them. All questions answered. She elects to proceed with ureteroscopy. Also send a urine for culture as a precaution although her symptoms are likely  from the stent. She may have some bacteria in the urine and we would treat for the procedure. We discussed risks of URS such as ureteral injury, scarring among others.      Signatures Electronically signed by : Jerilee Field, M.D.; Feb 25 2013  2:38PM  ADD: Urine Culture grew enterococcus - started Cipro despite it being resistant and will also add vancomycin.

## 2013-03-04 ENCOUNTER — Encounter (HOSPITAL_COMMUNITY): Payer: Self-pay | Admitting: Anesthesiology

## 2013-03-04 ENCOUNTER — Encounter (HOSPITAL_COMMUNITY)
Admission: RE | Disposition: A | Discharge status: Home / Self Care | Payer: Self-pay | Source: Ambulatory Visit | Attending: Urology

## 2013-03-04 ENCOUNTER — Ambulatory Visit (HOSPITAL_COMMUNITY)
Admission: RE | Admit: 2013-03-04 | Discharge: 2013-03-04 | Disposition: A | Discharge status: Home / Self Care | Payer: BC Managed Care – PPO | Source: Ambulatory Visit | Attending: Urology | Admitting: Urology

## 2013-03-04 ENCOUNTER — Ambulatory Visit (HOSPITAL_COMMUNITY): Payer: BC Managed Care – PPO | Admitting: Anesthesiology

## 2013-03-04 DIAGNOSIS — Z01810 Encounter for preprocedural cardiovascular examination: Secondary | ICD-10-CM | POA: Insufficient documentation

## 2013-03-04 DIAGNOSIS — Z01812 Encounter for preprocedural laboratory examination: Secondary | ICD-10-CM | POA: Insufficient documentation

## 2013-03-04 DIAGNOSIS — N201 Calculus of ureter: Secondary | ICD-10-CM | POA: Insufficient documentation

## 2013-03-04 DIAGNOSIS — I10 Essential (primary) hypertension: Secondary | ICD-10-CM | POA: Insufficient documentation

## 2013-03-04 DIAGNOSIS — Z79899 Other long term (current) drug therapy: Secondary | ICD-10-CM | POA: Insufficient documentation

## 2013-03-04 DIAGNOSIS — E7800 Pure hypercholesterolemia, unspecified: Secondary | ICD-10-CM | POA: Insufficient documentation

## 2013-03-04 DIAGNOSIS — N200 Calculus of kidney: Secondary | ICD-10-CM | POA: Insufficient documentation

## 2013-03-04 DIAGNOSIS — Z859 Personal history of malignant neoplasm, unspecified: Secondary | ICD-10-CM | POA: Insufficient documentation

## 2013-03-04 DIAGNOSIS — Z87891 Personal history of nicotine dependence: Secondary | ICD-10-CM | POA: Insufficient documentation

## 2013-03-04 DIAGNOSIS — E039 Hypothyroidism, unspecified: Secondary | ICD-10-CM | POA: Insufficient documentation

## 2013-03-04 HISTORY — PX: CYSTOSCOPY WITH URETEROSCOPY AND STENT PLACEMENT: SHX6377

## 2013-03-04 HISTORY — PX: HOLMIUM LASER APPLICATION: SHX5852

## 2013-03-04 SURGERY — CYSTOURETEROSCOPY, WITH STENT INSERTION
Anesthesia: General | Laterality: Right | Wound class: Clean Contaminated

## 2013-03-04 MED ORDER — SODIUM CHLORIDE 0.9 % IR SOLN
Status: DC | PRN
  Administered 2013-03-04: 4000 mL via INTRAVESICAL

## 2013-03-04 MED ORDER — FENTANYL CITRATE 0.05 MG/ML IJ SOLN
INTRAMUSCULAR | Status: DC | PRN
  Administered 2013-03-04: 50 ug via INTRAVENOUS
  Administered 2013-03-04: 25 ug via INTRAVENOUS
  Administered 2013-03-04: 50 ug via INTRAVENOUS
  Administered 2013-03-04: 25 ug via INTRAVENOUS

## 2013-03-04 MED ORDER — FENTANYL CITRATE 0.05 MG/ML IJ SOLN: 25.0000 ug | INTRAMUSCULAR | Status: DC | PRN

## 2013-03-04 MED ORDER — CIPROFLOXACIN IN D5W 400 MG/200ML IV SOLN
INTRAVENOUS | Status: AC
  Filled 2013-03-04: qty 200

## 2013-03-04 MED ORDER — OXYCODONE-ACETAMINOPHEN 5-325 MG PO TABS
1.0000 | ORAL_TABLET | ORAL | Status: DC | PRN
  Administered 2013-03-04: 1 via ORAL
  Filled 2013-03-04: qty 1

## 2013-03-04 MED ORDER — LACTATED RINGERS IV SOLN
INTRAVENOUS | Status: DC
  Administered 2013-03-04: 1000 mL via INTRAVENOUS

## 2013-03-04 MED ORDER — MIDAZOLAM HCL 5 MG/5ML IJ SOLN
INTRAMUSCULAR | Status: DC | PRN
  Administered 2013-03-04: 2 mg via INTRAVENOUS

## 2013-03-04 MED ORDER — VANCOMYCIN HCL IN DEXTROSE 1-5 GM/200ML-% IV SOLN
1000.0000 mg | INTRAVENOUS | Status: AC
  Administered 2013-03-04: 1000 mg via INTRAVENOUS

## 2013-03-04 MED ORDER — DEXAMETHASONE SODIUM PHOSPHATE 10 MG/ML IJ SOLN
INTRAMUSCULAR | Status: DC | PRN
  Administered 2013-03-04: 10 mg via INTRAVENOUS

## 2013-03-04 MED ORDER — PROPOFOL 10 MG/ML IV BOLUS
INTRAVENOUS | Status: DC | PRN
  Administered 2013-03-04: 170 mg via INTRAVENOUS

## 2013-03-04 MED ORDER — PROMETHAZINE HCL 25 MG/ML IJ SOLN: 6.2500 mg | INTRAMUSCULAR | Status: DC | PRN

## 2013-03-04 MED ORDER — CIPROFLOXACIN IN D5W 400 MG/200ML IV SOLN
400.0000 mg | INTRAVENOUS | Status: AC
  Administered 2013-03-04: 400 mg via INTRAVENOUS

## 2013-03-04 MED ORDER — LIDOCAINE HCL 1 % IJ SOLN
INTRAMUSCULAR | Status: DC | PRN
  Administered 2013-03-04: 60 mg via INTRADERMAL

## 2013-03-04 MED ORDER — ONDANSETRON HCL 4 MG/2ML IJ SOLN
INTRAMUSCULAR | Status: DC | PRN
  Administered 2013-03-04: 4 mg via INTRAMUSCULAR

## 2013-03-04 MED ORDER — VANCOMYCIN HCL IN DEXTROSE 1-5 GM/200ML-% IV SOLN
INTRAVENOUS | Status: AC
  Filled 2013-03-04: qty 200

## 2013-03-04 SURGICAL SUPPLY — 22 items
BAG URO CATCHER STRL LF (DRAPE) ×3 IMPLANT
BASKET ZERO TIP NITINOL 2.4FR (BASKET) ×1 IMPLANT
BSKT STON RTRVL ZERO TP 2.4FR (BASKET) ×2
CATH INTERMIT  6FR 70CM (CATHETERS) ×3 IMPLANT
CATH URET 5FR 28IN CONE TIP (BALLOONS)
CATH URET 5FR 28IN OPEN ENDED (CATHETERS) ×2 IMPLANT
CATH URET 5FR 70CM CONE TIP (BALLOONS) ×2 IMPLANT
CLOTH BEACON ORANGE TIMEOUT ST (SAFETY) ×3 IMPLANT
CONT SPEC 4OZ CLIKSEAL STRL BL (MISCELLANEOUS) ×2 IMPLANT
DRAPE CAMERA CLOSED 9X96 (DRAPES) ×3 IMPLANT
FIBER LASER FLEXIVA 200 (UROLOGICAL SUPPLIES) ×1 IMPLANT
GLOVE BIO SURGEON STRL SZ7.5 (GLOVE) ×3 IMPLANT
GLOVE BIOGEL M STRL SZ7.5 (GLOVE) ×3 IMPLANT
GOWN PREVENTION PLUS XLARGE (GOWN DISPOSABLE) ×3 IMPLANT
GOWN STRL REIN XL XLG (GOWN DISPOSABLE) ×3 IMPLANT
GUIDEWIRE STR DUAL SENSOR (WIRE) ×4 IMPLANT
MANIFOLD NEPTUNE II (INSTRUMENTS) ×3 IMPLANT
PACK CYSTO (CUSTOM PROCEDURE TRAY) ×3 IMPLANT
SHEATH ACCESS URETERAL 38CM (SHEATH) ×1 IMPLANT
STENT CONTOUR 6FRX24X.038 (STENTS) ×1 IMPLANT
TUBING CONNECTING 10 (TUBING) ×3 IMPLANT
WIRE COONS/BENSON .038X145CM (WIRE) ×2 IMPLANT

## 2013-03-04 NOTE — Transfer of Care (Signed)
Immediate Anesthesia Transfer of Care Note  Patient: [name]  Procedure(s) Performed: Procedure(s): CYSTOSCOPY WITH URETEROSCOPY, LASER LITHOTRIPSY  AND STENT PLACEMENT (Right) HOLMIUM LASER APPLICATION (N/A)  Patient Location: PACU  Anesthesia Type:General  Level of Consciousness: awake, alert , oriented and patient cooperative  Airway & Oxygen Therapy: Patient Spontanous Breathing and Patient connected to face mask oxygen  Post-op Assessment: Report given to PACU RN and Post -op Vital signs reviewed and stable  Post vital signs: Reviewed and stable  Complications: No apparent anesthesia complications

## 2013-03-04 NOTE — Interval H&P Note (Signed)
History and Physical Interval Note:  03/04/2013 8:53 AM  [name]  has presented today for surgery, with the diagnosis of RIGHT NEPHROLITHIASIS  The various methods of treatment have been discussed with the patient and family. After consideration of risks, benefits and other options for treatment, the patient has consented to  Procedure(s): CYSTOSCOPY WITH URETEROSCOPY, LASER LITHOTRIPSY  AND STENT PLACEMENT (Right) HOLMIUM LASER APPLICATION (N/A) as a surgical intervention .  The patient's history has been reviewed, patient examined, no change in status, stable for surgery.  I have reviewed the patient's chart and labs.  Questions were answered to the patient's satisfaction.     Antony Haste

## 2013-03-04 NOTE — Interval H&P Note (Signed)
History and Physical Interval Note:  03/04/2013 8:53 AM  [name]  has presented today for surgery, with the diagnosis of RIGHT NEPHROLITHIASIS  The various methods of treatment have been discussed with the patient and family. After consideration of risks, benefits and other options for treatment, the patient has consented to  Procedure(s): CYSTOSCOPY WITH URETEROSCOPY, LASER LITHOTRIPSY  AND STENT PLACEMENT (Right) HOLMIUM LASER APPLICATION (N/A) as a surgical intervention .  The patient's history has been reviewed, patient examined, no change in status, stable for surgery.  I have reviewed the patient's chart and labs.  Questions were answered to the patient's satisfaction.  I should add she started Cipro and I added Vanc to pre-op abx. Also, she has had no dysuria or fever. No bladder pain apart from mild freq and urge likely from stent.    Antony Haste

## 2013-03-04 NOTE — Op Note (Signed)
Diagnosis: Right ureteral stone, right renal stone Postoperative diagnosis: Right renal stone  Procedure: Cystoscopy right ureteroscopy, laser lithotripsy, stone basket extraction, stent placement  Surgeon: Mena Goes  Anesthesia: Gen.  Findings: Bladder free of stone with normal mucosa, ureter free of stone, stone located in right renal pelvis which was fragmented and removed  Description of procedure: After consent was obtained patient brought to the operating room. After adequate anesthesia she was placed in lithotomy position and prepped and draped in the usual sterile fashion. A timeout was performed to confirm the patient and procedure. Cystoscope was passed per urethra and the right ureteral stent grasped and removed through the urethral meatus. A sensor wire was advanced and coiled in the collecting system. The stent was removed. A rigid ureteroscope was advanced adjacent to the stent and I was able to inspect up to the top SI joint but go no further. Therefore second sensor wire was advanced and the rigid scope removed. Over the second wire an access sheath was advanced to the level of prior inspection. The digital ureteroscope was then used to advance and the remainder the ureter was inspected and noted to be stone free. Once the ureteroscope was in the kidney I advanced the access sheath over the scope to bring it into the proximal ureter to provide better drainage of the kidney. Now the upper middle and lower pole collecting system was visualized in great detail. The stone was found in the renal pelvis drifting toward the midpole and I bumped it up into the upper pole for fragmentation as there was too large to remove intact. Using a setting of 0.5 and 20 the stone was broken into several small pieces. These were removed sequentially with the Nitinol 0 tip basket. All the remaining were some very small clinically insignificant fragments. Final inspection of the upper, middle and lower pole  collecting system noted to be stone free. The access sheath was backed out on the ureteroscope and the UPJ and ureter carefully inspected. I did note in the very proximal ureter just distal to the UPJ there was a short narrow flap of mucosa at about the 2:00 position but this did not appear to be of any significance  But I decided to leave the stent for a few days. The rest of the ureter was examined and again noted to be free of injury or stone. The wire was backloaded on the cystoscope and a 6 x 24 cm stent was advanced. The wire was removed and a good coil was seen in the renal pelvis and a good coil in the bladder. The bladder was drained and the scope removed the string was fixed to the patient. She was awakened and taken to the recovery room in stable condition.  Complication: None Specimen: Stone fragments given to patient Blood loss: Minimal  Drains: 6 x 24 cm right ureteral stent with string  Disposition: Patient stable to PACU

## 2013-03-04 NOTE — Progress Notes (Signed)
Patient has no visible wound. But blue string to sent is taped to leg and intact

## 2013-03-04 NOTE — Anesthesia Preprocedure Evaluation (Addendum)
Anesthesia Evaluation  Patient identified by MRN, date of birth, ID band Patient awake    Reviewed: Allergy & Precautions, H&P , NPO status , Patient's Chart, lab work & pertinent test results  Airway Mallampati: II TM Distance: >3 FB Neck ROM: Full    Dental  (+) Chipped and Missing Missing some teeth on the right. New chip on lower front tooth per patient report.:   Pulmonary asthma , pneumonia -,  CXR: 02-10-13: mild cardiomegaly, left mid lung atelectasis. breath sounds clear to auscultation  Pulmonary exam normal       Cardiovascular Exercise Tolerance: Good hypertension, Pt. on medications Rhythm:Regular Rate:Normal  ECG reviewed: NSR, possible LAE   Neuro/Psych negative neurological ROS  negative psych ROS   GI/Hepatic negative GI ROS, Neg liver ROS,   Endo/Other  Hypothyroidism   Renal/GU Renal disease  negative genitourinary   Musculoskeletal negative musculoskeletal ROS (+)   Abdominal   Peds negative pediatric ROS (+)  Hematology negative hematology ROS (+)   Anesthesia Other Findings   Reproductive/Obstetrics negative OB ROS                          Anesthesia Physical Anesthesia Plan  ASA: II  Anesthesia Plan: General   Post-op Pain Management:    Induction: Intravenous  Airway Management Planned: LMA  Additional Equipment:   Intra-op Plan:   Post-operative Plan: Extubation in OR  Informed Consent: I have reviewed the patients History and Physical, chart, labs and discussed the procedure including the risks, benefits and alternatives for the proposed anesthesia with the patient or authorized representative who has indicated his/her understanding and acceptance.   Dental advisory given  Plan Discussed with: CRNA  Anesthesia Plan Comments: (No current GERD symptoms. No asthma symptoms, rare inhaler use. Did well with most recent LMA general.)       Anesthesia  Quick Evaluation

## 2013-03-04 NOTE — Anesthesia Postprocedure Evaluation (Signed)
  Anesthesia Post-op Note  Patient: [name]  Procedure(s) Performed: Procedure(s) (LRB): CYSTOSCOPY WITH URETEROSCOPY, LASER LITHOTRIPSY  AND STENT PLACEMENT (Right) HOLMIUM LASER APPLICATION (N/A)  Patient Location: PACU  Anesthesia Type: General  Level of Consciousness: awake and alert   Airway and Oxygen Therapy: Patient Spontanous Breathing  Post-op Pain: mild  Post-op Assessment: Post-op Vital signs reviewed, Patient's Cardiovascular Status Stable, Respiratory Function Stable, Patent Airway and No signs of Nausea or vomiting  Last Vitals:  Filed Vitals:   03/04/13 1230  BP: 119/56  Pulse: 107  Temp:   Resp: 16    Post-op Vital Signs: stable   Complications: No apparent anesthesia complications

## 2013-03-04 NOTE — Progress Notes (Signed)
Stent pulled as per Dr Mena Goes instructions.

## 2013-03-04 NOTE — Preoperative (Signed)
Beta Blockers   Reason not to administer Beta Blockers:Not Applicable 

## 2013-03-04 NOTE — Progress Notes (Signed)
Ambulated to BR. Patient had leaked incontinently in bed and did so to and from bathroom , a good amount. Voided a moderate amount in toilet of tea colored urine. States has never leaked before surgery. Call to Dr Stephanie Acre

## 2013-03-05 ENCOUNTER — Encounter (HOSPITAL_COMMUNITY): Payer: Self-pay | Admitting: Urology

## 2013-03-19 IMAGING — CR DG CHEST 1V PORT
1 series · 1 of 1 positions shown · non-contrast
Comparison: 02/23/2011

CLINICAL DATA: Shortness of breath

PORTABLE CHEST - 1 VIEW

[AP]
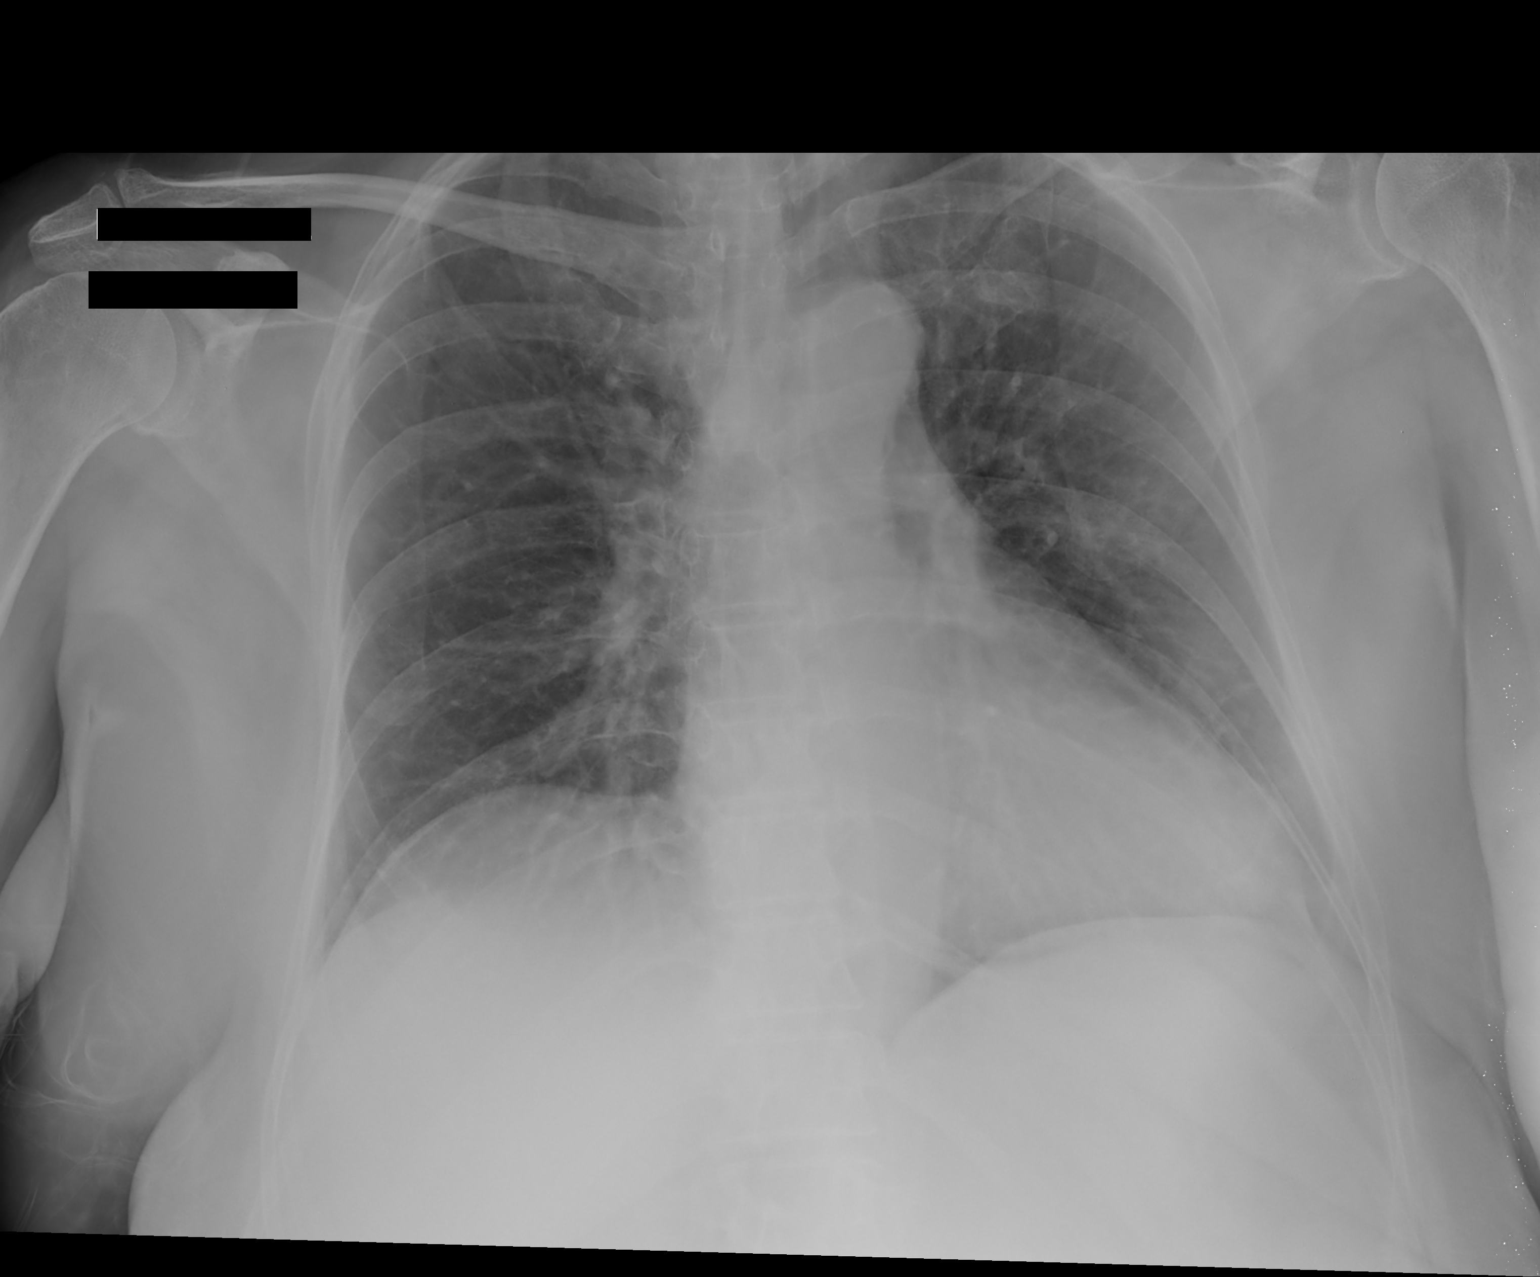

[1 of 1 positions shown; findings below may reference images not displayed]

FINDINGS: Heart size upper normal limits to mildly enlarged.  No
focal consolidation.  No pleural effusion or pneumothorax.  No
acute osseous abnormality.
IMPRESSION: Heart size upper normal limits to mildly enlarged without focal
consolidation.

## 2013-03-19 IMAGING — CR DG CHEST 1V PORT
1 series · 1 of 1 positions shown · non-contrast
Comparison: 05/14/2011

CLINICAL DATA: Evaluate ET tube placement and right IJ placement

PORTABLE CHEST - 1 VIEW

[view not recorded]
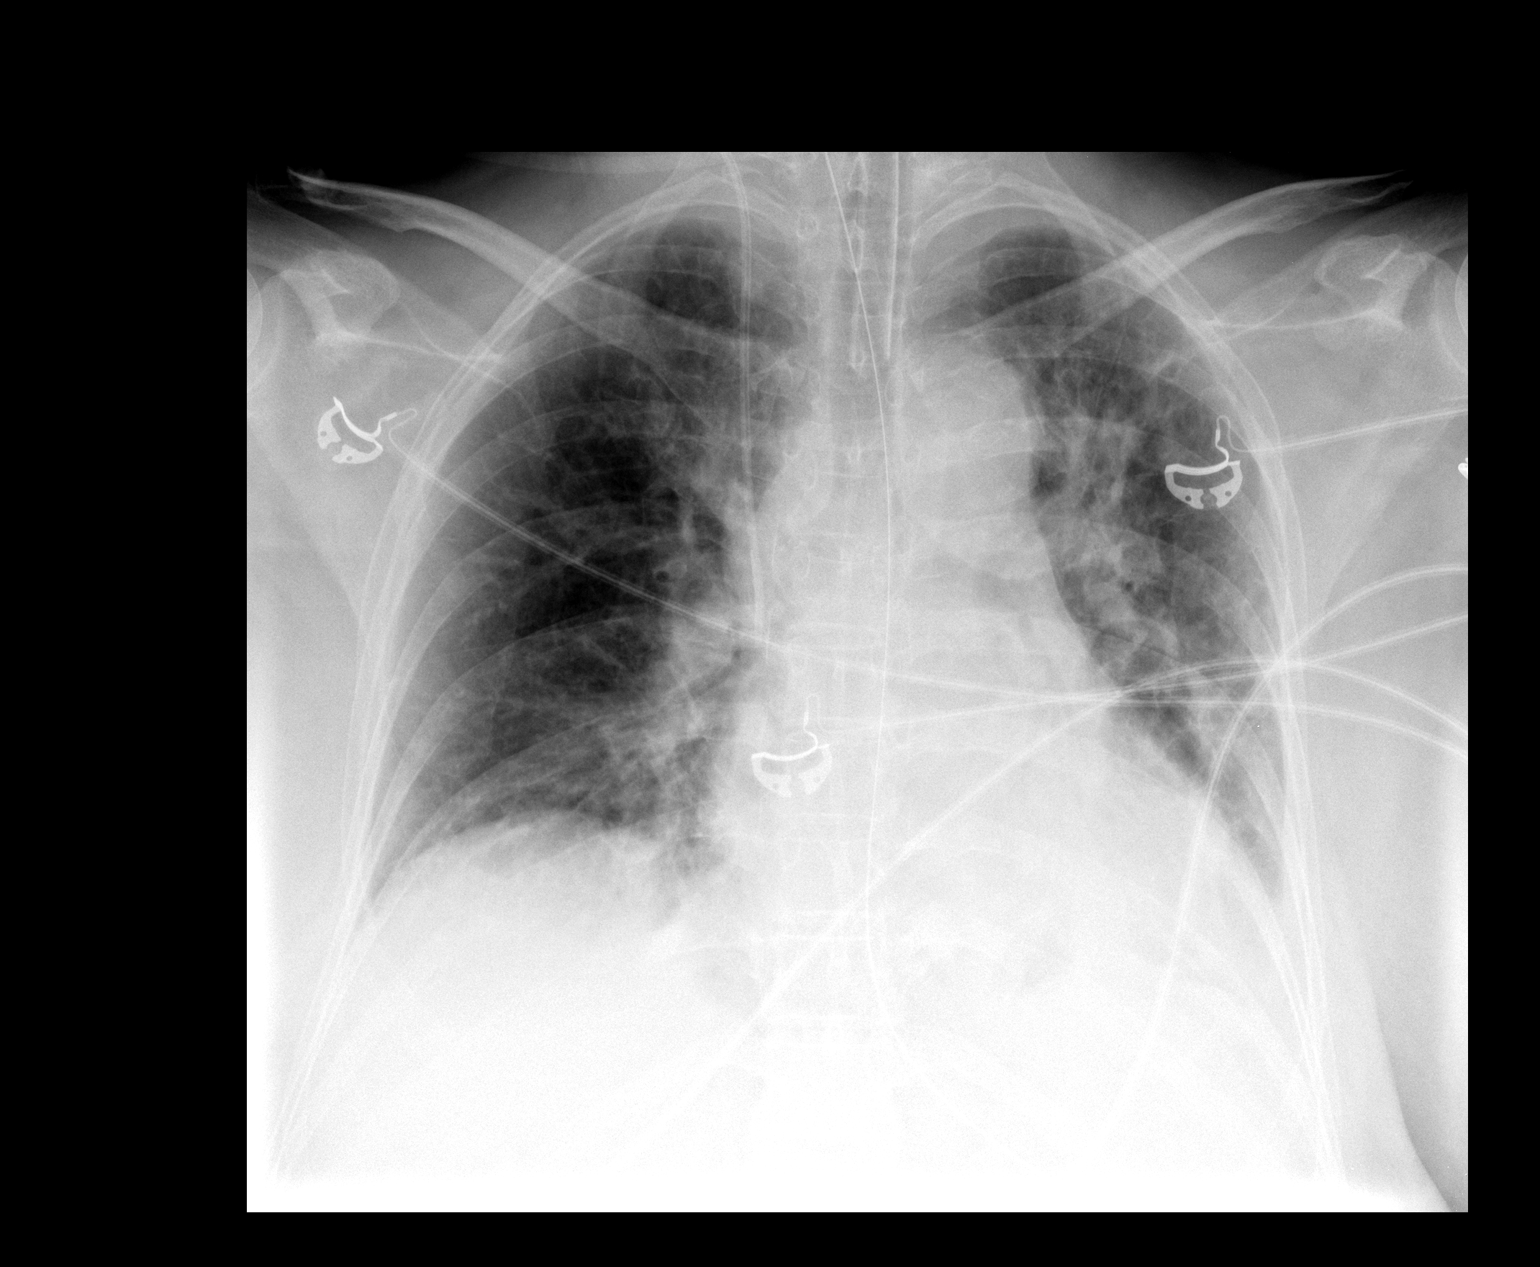

[1 of 1 positions shown; findings below may reference images not displayed]

FINDINGS: There is a ET tube with tip above the carina.

There is a right IJ catheter with tip in the cavoatrial junction.

The nasogastric tube tip is in the stomach.

Heart size is normal. Since the earlier exam there has been
increase in interstitial markings suggesting pulmonary edema.

Atelectasis is noted within both lung bases.
IMPRESSION: 1.  The ET tube and the right IJ catheter are in satisfactory
position.  No pneumothorax identified.
2.  Suspect increase in pulmonary edema with bibasilar atelectasis.

## 2013-03-20 ENCOUNTER — Telehealth (INDEPENDENT_AMBULATORY_CARE_PROVIDER_SITE_OTHER): Payer: Self-pay | Admitting: Surgery

## 2013-03-20 IMAGING — CR DG CHEST 1V PORT SAME DAY
1 series · 1 of 1 positions shown · non-contrast
Comparison: Portable film earlier in the day

CLINICAL DATA: Follow-up bowel surgery.  Endotracheal tube removed.

PORTABLE CHEST - 1 VIEW SAME DAY

[AP]
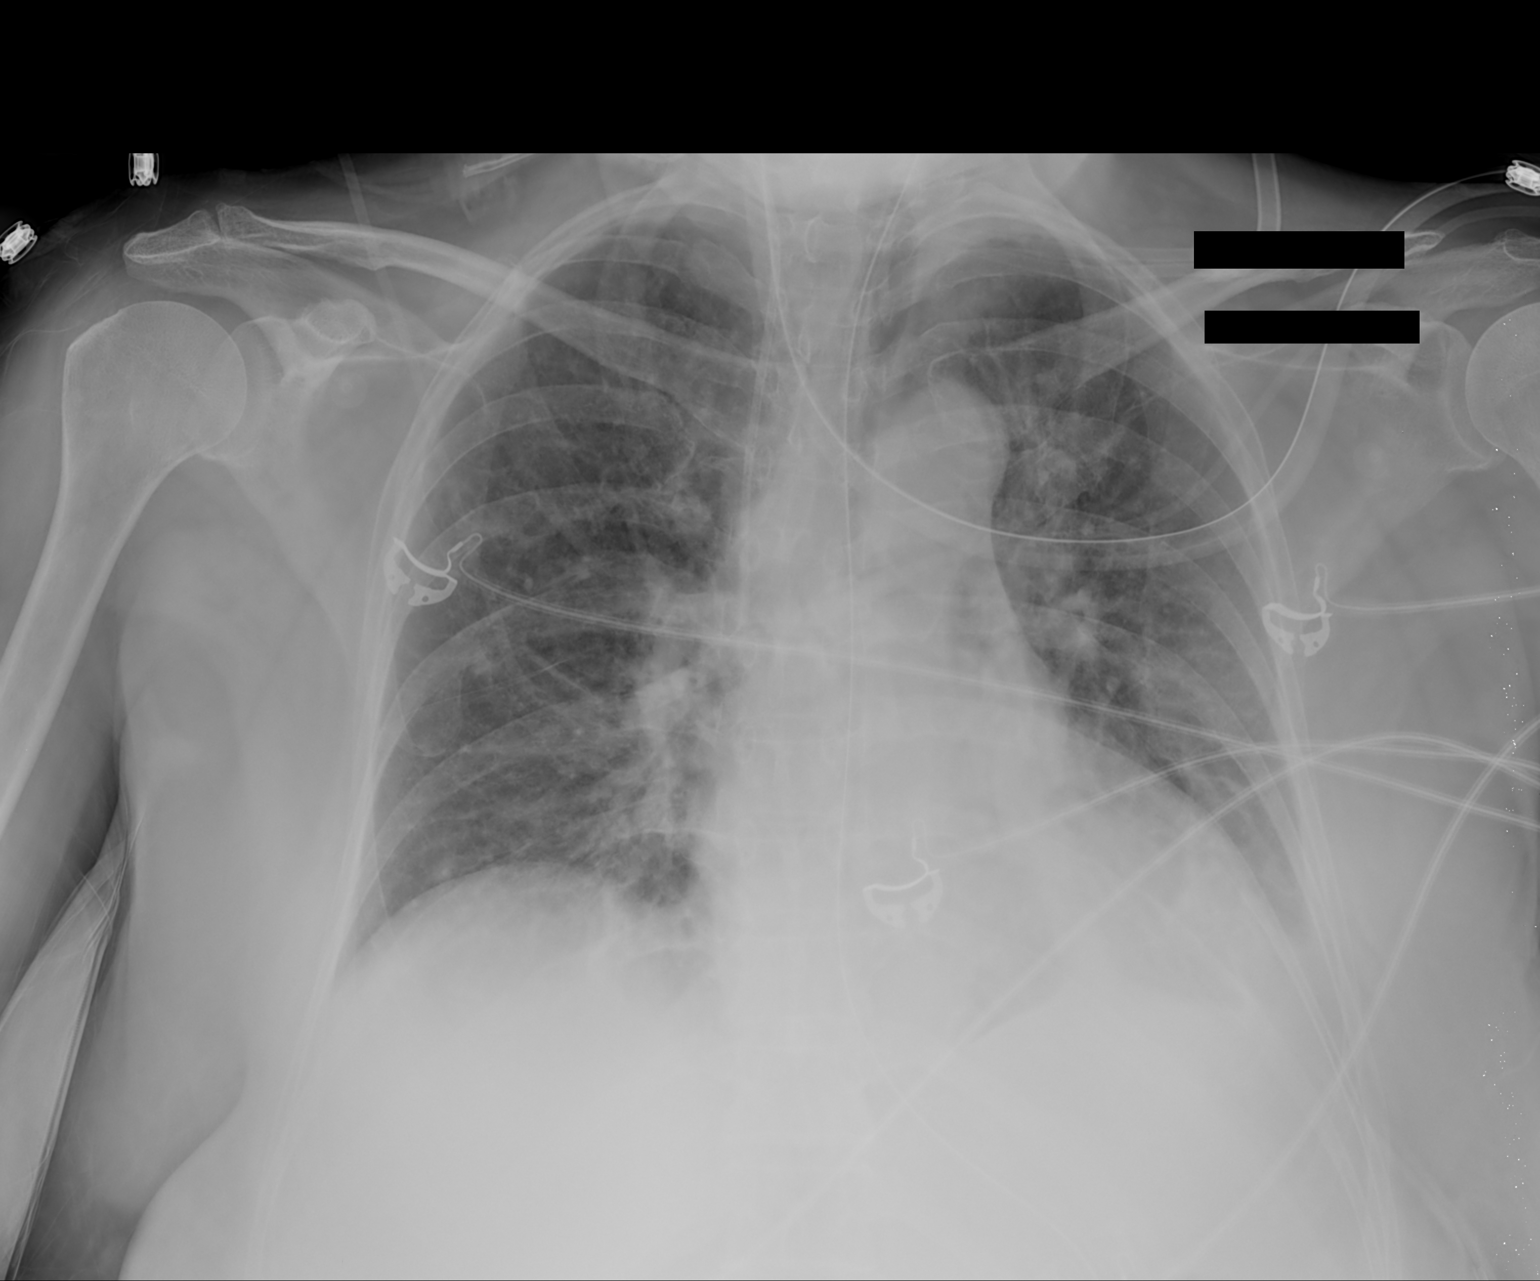

[1 of 1 positions shown; findings below may reference images not displayed]

FINDINGS: Endotracheal tube has been removed.  Mild atelectasis
left base is stable.  Nasogastric tube good position.  Central
venous catheter unchanged SVC.  Mild vascular congestion.  No
pneumothorax.
IMPRESSION: Satisfactory appearance following endotracheal tube removal.  Mild
left basilar atelectasis unchanged.

## 2013-03-20 NOTE — Telephone Encounter (Signed)
The patient called concerned.  She would switch the phone between her husband and then back to herself.  Patient has had numerous surgeries for ostomy takedown leak and re\re creation.  Had successful ostomy takedown in the summer.  Normally takes a fiber supplement.  She is noted to have some incisional hernias.  They have been soft and reducible.  The husband claims it has been recommended to do no surgery on him at this time.  However, she is more sore with them today.  She says the hernia has a lump now and she cannot get it to reduce.  She is eating fine.  No nausea or vomiting.  Has had some mild bloating.  She feels constipated has not had a bowel movement in 12 hours.  She is having flatus.  She tried laxatives twice.  I recommend she try and reduce the hernia with ice pack/heat while supine.  If it goes back in and she feels better, call us in the AM and let us know.  If it stays out and is getting more painful, it is as potential surgical emergency and needs to be evaluated urgently in the emergency room.  She expressed understanding and appreciation and we will make a decision about what to do.

## 2013-03-21 ENCOUNTER — Inpatient Hospital Stay (HOSPITAL_COMMUNITY)
Admission: EM | Admit: 2013-03-21 | Discharge: 2013-03-23 | DRG: 390 | Disposition: A | Discharge status: Home / Self Care | Payer: BC Managed Care – PPO | Attending: Emergency Medicine | Admitting: Emergency Medicine

## 2013-03-21 ENCOUNTER — Emergency Department (HOSPITAL_COMMUNITY): Payer: BC Managed Care – PPO

## 2013-03-21 ENCOUNTER — Encounter (HOSPITAL_COMMUNITY): Payer: Self-pay | Admitting: Emergency Medicine

## 2013-03-21 DIAGNOSIS — E039 Hypothyroidism, unspecified: Secondary | ICD-10-CM | POA: Diagnosis present

## 2013-03-21 DIAGNOSIS — Z87442 Personal history of urinary calculi: Secondary | ICD-10-CM

## 2013-03-21 DIAGNOSIS — D649 Anemia, unspecified: Secondary | ICD-10-CM | POA: Diagnosis present

## 2013-03-21 DIAGNOSIS — K56609 Unspecified intestinal obstruction, unspecified as to partial versus complete obstruction: Secondary | ICD-10-CM

## 2013-03-21 DIAGNOSIS — Z87891 Personal history of nicotine dependence: Secondary | ICD-10-CM

## 2013-03-21 DIAGNOSIS — E785 Hyperlipidemia, unspecified: Secondary | ICD-10-CM | POA: Diagnosis present

## 2013-03-21 DIAGNOSIS — F3289 Other specified depressive episodes: Secondary | ICD-10-CM | POA: Diagnosis present

## 2013-03-21 DIAGNOSIS — K432 Incisional hernia without obstruction or gangrene: Secondary | ICD-10-CM | POA: Diagnosis present

## 2013-03-21 DIAGNOSIS — Z9049 Acquired absence of other specified parts of digestive tract: Secondary | ICD-10-CM

## 2013-03-21 DIAGNOSIS — F329 Major depressive disorder, single episode, unspecified: Secondary | ICD-10-CM | POA: Diagnosis present

## 2013-03-21 DIAGNOSIS — R159 Full incontinence of feces: Secondary | ICD-10-CM | POA: Diagnosis present

## 2013-03-21 DIAGNOSIS — N200 Calculus of kidney: Secondary | ICD-10-CM | POA: Diagnosis present

## 2013-03-21 DIAGNOSIS — N390 Urinary tract infection, site not specified: Secondary | ICD-10-CM

## 2013-03-21 DIAGNOSIS — I10 Essential (primary) hypertension: Secondary | ICD-10-CM | POA: Diagnosis present

## 2013-03-21 HISTORY — DX: Calculus of kidney: N20.0

## 2013-03-21 HISTORY — DX: Diverticulosis of large intestine without perforation or abscess without bleeding: K57.30

## 2013-03-21 LAB — URINE MICROSCOPIC-ADD ON

## 2013-03-21 LAB — CBC WITH DIFFERENTIAL/PLATELET
Basophils Relative: 0 % (ref 0–1)
Eosinophils Absolute: 0.3 10*3/uL (ref 0.0–0.7)
Hemoglobin: 12.2 g/dL (ref 12.0–15.0)
Lymphocytes Relative: 21 % (ref 12–46)
MCH: 28 pg (ref 26.0–34.0)
MCHC: 32.4 g/dL (ref 30.0–36.0)
Monocytes Absolute: 0.7 10*3/uL (ref 0.1–1.0)
Monocytes Relative: 5 % (ref 3–12)
Neutro Abs: 9.3 10*3/uL — ABNORMAL HIGH (ref 1.7–7.7)
Neutrophils Relative %: 71 % (ref 43–77)
Platelets: 231 10*3/uL (ref 150–400)
RBC: 4.35 MIL/uL (ref 3.87–5.11)
WBC: 13.1 10*3/uL — ABNORMAL HIGH (ref 4.0–10.5)

## 2013-03-21 LAB — COMPREHENSIVE METABOLIC PANEL
ALT: 34 U/L (ref 0–35)
AST: 35 U/L (ref 0–37)
Albumin: 4.4 g/dL (ref 3.5–5.2)
Alkaline Phosphatase: 70 U/L (ref 39–117)
BUN: 24 mg/dL — ABNORMAL HIGH (ref 6–23)
Calcium: 11.3 mg/dL — ABNORMAL HIGH (ref 8.4–10.5)
Chloride: 100 mEq/L (ref 96–112)
Potassium: 4.3 mEq/L (ref 3.5–5.1)
Sodium: 138 mEq/L (ref 135–145)
Total Protein: 8.3 g/dL (ref 6.0–8.3)

## 2013-03-21 LAB — LIPASE, BLOOD: Lipase: 37 U/L (ref 11–59)

## 2013-03-21 LAB — URINALYSIS, ROUTINE W REFLEX MICROSCOPIC
Bilirubin Urine: NEGATIVE
Glucose, UA: NEGATIVE mg/dL
Nitrite: NEGATIVE
Specific Gravity, Urine: 1.031 — ABNORMAL HIGH (ref 1.005–1.030)
Urobilinogen, UA: 0.2 mg/dL (ref 0.0–1.0)
pH: 5 (ref 5.0–8.0)

## 2013-03-21 MED ORDER — ONDANSETRON 8 MG PO TBDP
8.0000 mg | ORAL_TABLET | Freq: Once | ORAL | Status: AC
  Administered 2013-03-21: 8 mg via ORAL
  Filled 2013-03-21: qty 1

## 2013-03-21 MED ORDER — HYDROMORPHONE HCL PF 1 MG/ML IJ SOLN
1.0000 mg | Freq: Once | INTRAMUSCULAR | Status: AC
  Administered 2013-03-21: 1 mg via INTRAMUSCULAR
  Filled 2013-03-21: qty 1

## 2013-03-21 MED ORDER — DIPHENHYDRAMINE HCL 50 MG/ML IJ SOLN: 12.5000 mg | Freq: Four times a day (QID) | INTRAMUSCULAR | Status: DC | PRN

## 2013-03-21 MED ORDER — DIPHENHYDRAMINE HCL 12.5 MG/5ML PO ELIX: 12.5000 mg | ORAL_SOLUTION | Freq: Four times a day (QID) | ORAL | Status: DC | PRN

## 2013-03-21 MED ORDER — ONDANSETRON HCL 4 MG/2ML IJ SOLN: 4.0000 mg | Freq: Four times a day (QID) | INTRAMUSCULAR | Status: DC | PRN

## 2013-03-21 MED ORDER — FENTANYL CITRATE 0.05 MG/ML IJ SOLN: 25.0000 ug | INTRAMUSCULAR | Status: DC | PRN

## 2013-03-21 MED ORDER — BISACODYL 10 MG RE SUPP: 10.0000 mg | Freq: Two times a day (BID) | RECTAL | Status: DC | PRN

## 2013-03-21 MED ORDER — BISACODYL 10 MG RE SUPP: 10.0000 mg | Freq: Every day | RECTAL | Status: DC

## 2013-03-21 MED ORDER — LIP MEDEX EX OINT
1.0000 "application " | TOPICAL_OINTMENT | Freq: Two times a day (BID) | CUTANEOUS | Status: DC
  Administered 2013-03-21 – 2013-03-22 (×2): 1 via TOPICAL
  Filled 2013-03-21 (×2): qty 7

## 2013-03-21 MED ORDER — ONDANSETRON HCL 4 MG/2ML IJ SOLN
4.0000 mg | Freq: Once | INTRAMUSCULAR | Status: AC
  Administered 2013-03-21: 4 mg via INTRAVENOUS
  Filled 2013-03-21: qty 2

## 2013-03-21 MED ORDER — METOPROLOL TARTRATE 1 MG/ML IV SOLN
5.0000 mg | Freq: Four times a day (QID) | INTRAVENOUS | Status: DC | PRN
  Filled 2013-03-21: qty 5

## 2013-03-21 MED ORDER — PROMETHAZINE HCL 25 MG/ML IJ SOLN
12.5000 mg | Freq: Once | INTRAMUSCULAR | Status: AC
  Administered 2013-03-21: 12.5 mg via INTRAVENOUS
  Filled 2013-03-21: qty 1

## 2013-03-21 MED ORDER — MAGIC MOUTHWASH
15.0000 mL | Freq: Four times a day (QID) | ORAL | Status: DC | PRN
  Filled 2013-03-21: qty 15

## 2013-03-21 MED ORDER — LACTATED RINGERS IV BOLUS (SEPSIS): 1000.0000 mL | Freq: Once | INTRAVENOUS | Status: DC

## 2013-03-21 MED ORDER — HEPARIN SODIUM (PORCINE) 5000 UNIT/ML IJ SOLN
5000.0000 [IU] | Freq: Three times a day (TID) | INTRAMUSCULAR | Status: DC
  Administered 2013-03-21 – 2013-03-22 (×3): 5000 [IU] via SUBCUTANEOUS
  Filled 2013-03-21 (×9): qty 1

## 2013-03-21 MED ORDER — LACTATED RINGERS IV BOLUS (SEPSIS): 1000.0000 mL | Freq: Three times a day (TID) | INTRAVENOUS | Status: AC | PRN

## 2013-03-21 MED ORDER — ACETAMINOPHEN 325 MG PO TABS: 650.0000 mg | ORAL_TABLET | Freq: Four times a day (QID) | ORAL | Status: DC | PRN

## 2013-03-21 MED ORDER — ACETAMINOPHEN 650 MG RE SUPP: 650.0000 mg | Freq: Four times a day (QID) | RECTAL | Status: DC | PRN

## 2013-03-21 MED ORDER — HYDROMORPHONE HCL PF 1 MG/ML IJ SOLN: 0.5000 mg | INTRAMUSCULAR | Status: DC | PRN

## 2013-03-21 MED ORDER — HYDROMORPHONE HCL PF 1 MG/ML IJ SOLN
1.0000 mg | Freq: Once | INTRAMUSCULAR | Status: AC
  Administered 2013-03-21: 1 mg via INTRAVENOUS
  Filled 2013-03-21: qty 1

## 2013-03-21 MED ORDER — PROMETHAZINE HCL 25 MG/ML IJ SOLN
12.5000 mg | Freq: Four times a day (QID) | INTRAMUSCULAR | Status: DC | PRN
  Administered 2013-03-21: 10:00:00 12.5 mg via INTRAVENOUS
  Filled 2013-03-21: qty 1

## 2013-03-21 MED ORDER — KCL IN DEXTROSE-NACL 40-5-0.45 MEQ/L-%-% IV SOLN
INTRAVENOUS | Status: DC
  Administered 2013-03-21: 23:00:00 via INTRAVENOUS
  Administered 2013-03-21 (×2): 150 mL/h via INTRAVENOUS
  Administered 2013-03-22: 06:00:00 via INTRAVENOUS
  Filled 2013-03-21 (×10): qty 1000

## 2013-03-21 MED ORDER — IOHEXOL 300 MG/ML  SOLN
50.0000 mL | Freq: Once | INTRAMUSCULAR | Status: AC | PRN
  Administered 2013-03-21: 50 mL via ORAL

## 2013-03-21 MED ORDER — METOCLOPRAMIDE HCL 5 MG/ML IJ SOLN: 5.0000 mg | Freq: Four times a day (QID) | INTRAMUSCULAR | Status: DC | PRN

## 2013-03-21 MED ORDER — ALUM & MAG HYDROXIDE-SIMETH 200-200-20 MG/5ML PO SUSP: 30.0000 mL | Freq: Four times a day (QID) | ORAL | Status: DC | PRN

## 2013-03-21 NOTE — ED Notes (Signed)
Pt reports that she has abdominal hernias with a surgery in August, states she spoke with her surgeon about onset of pain and nausea and was advised to be seen in the ED.

## 2013-03-21 NOTE — ED Notes (Signed)
Patient asking for additional nausea medication Will make PA aware

## 2013-03-21 NOTE — ED Notes (Signed)
Patient states that previously given pain medication effective and has "taken the edge off." Patient now rates abdominal pain 5/10 on pain scale Patient with c/o slight nausea--patient medicated, see MAR Patient and pt's husband deny further needs at this time Side rails up, call bell in reach

## 2013-03-21 NOTE — ED Provider Notes (Signed)
Shared service with midlevel provider. I have personally seen and examined the patient, providing direct face to face care, presenting with the chief complaint of abdominal pain. Hx of diverticulitis, small bowel perforation, bowel resection and abdominal hernia.  Physical exam findings include abd tenderness, with no peritoneal findings. CT confirms SBO. Surgery consulted.  Plan will be to admit. I have reviewed the nursing documentation on past medical history, family history, and social history.   Derwood Kaplan, MD 03/21/13 [phone]

## 2013-03-21 NOTE — ED Notes (Signed)
Patient back from CT dept 

## 2013-03-21 NOTE — ED Notes (Signed)
Patient declines PIV placement at this time PA aware

## 2013-03-21 NOTE — H&P (Signed)
[name]  [birth year] [id number]  CARE TEAM:  PCP: Kristian Covey, MD  Outpatient Care Team: Patient Care Team: Kristian Covey, MD as PCP - General Hilarie Fredrickson, MD as Consulting Physician (Gastroenterology) Adolph Pollack, MD as Consulting Physician (General Surgery) Antony Haste, MD as Consulting Physician (Urology)  Inpatient Treatment Team: Treatment Team: Attending Provider: Derwood Kaplan, MD; Registered Nurse: Alfredo Martinez, RN; Physician Assistant: Angus Seller, PA-C; Consulting Physician: Adolph Pollack, MD; Consulting Physician: Bishop Limbo, MD  This patient is a 62 y.o.female who presents today for surgical evaluation at the request of Dr. Jeanmarie Plant.   Reason for evaluation: Probable bowel obstruction  Pleasant woman it required abdominal colectomy Four severe pain, diverticulosis with multifocal diverticulitis.  Unfortunately she had an anastomotic leak that required diversion.  Had the ileostomy taken down again and the Tigan.  Finally, a second ostomy take down succeeded last year.  She is struggled with her loose bowels and some overflow fecal incontinence.  Required antidiarrheals.  That is gradually improved over the past year or two she has a bowel movement with urination and meals, averaging 4-6 times a day.  She has a large incisional hernia that is soft and reducible.  Has been recommended to avoid another operation on this for now.  She has had a couple episodes of bowel obstruction that resolved with nasogastric tube and IV fluids.  She called last night over concerns of no bowel movement for over 12 hours, markedly decreased flatus, increased nausea, hurting & swelling at the hernia incision.  She worsened again & went to emergency room.  Incisional hernia found to be reducible but patient more distended and nauseated.  Plain films concern for possible bowel obstruction.  CT scan concerning for early bowel obstruction.  A surgical consultation  recommended.  Past Medical History  Diagnosis Date  . HYPOTHYROIDISM 10/06/2008  . HYPERLIPIDEMIA 10/06/2008  . DEPRESSION 02/23/2010  . ALLERGIC RHINITIS 07/23/2009  . Diverticulosis of colon 11/10/2008  . 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 Coumadin-all resolved. no coumadin since 11/09/11  . Pneumonia 11-17-11    1'13 postop pneumonia.   Marland Kitchen HYPERTENSION 10/06/2008    EKG 12/12 EPIC  . 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 Hospital-acquired pneumonia 05/28/2011  . Asthma 11-17-11    no flareups in adult yrs. Albuterol used rarely  . Chronic kidney disease 11-17-11    hx. past multiple kidney stones, prev. lithos  . Arthritis     osteoarthritis  . Anemia     no recently    Past Surgical History  Procedure Laterality Date  . Thyroid surgery  1982    tumor removed  . Cesarean section  1983  . Rotator cuff repair  11-17-11    left '05  . Kidney stone surgery  2000, 2009, 2010  . Laparoscopic assissted total colectomy w/ j-pouch  03/01/11  . Ileostomy closure  05/02/2011    ileosomy takedown  . Laparotomy  05/14/2011    small bowel resection, ileostomy  . Abdominal hysterectomy  1986    fibroids  . Eye surgery      lasik  bilaterally  . Appendectomy    . Colon surgery  12/12    ileostomy   . Ileo loop colostomy closure  01/18/2012    Procedure: LAPAROSCOPIC ILEO LOOP COLOSTOMY CLOSURE;  Surgeon: Adolph Pollack, MD;  Location: [facility];  Service: General;  Laterality: N/A;  Laparoscopic Ileostomy Closure , lysis of adhesions   . Cystoscopy w/ ureteral stent placement Right 02/09/2013    Procedure: CYSTOSCOPY WITH RETROGRADE PYELOGRAM/URETERAL STENT PLACEMENT;  Surgeon: Antony Haste, MD;  Location: [facility];  Service: Urology;  Laterality: Right;  . Cystoscopy with ureteroscopy and stent placement Right 03/04/2013    Procedure: CYSTOSCOPY WITH URETEROSCOPY, LASER LITHOTRIPSY  AND STENT PLACEMENT;   Surgeon: Antony Haste, MD;  Location: [facility];  Service: Urology;  Laterality: Right;  . Holmium laser application N/A 03/04/2013    Procedure: HOLMIUM LASER APPLICATION;  Surgeon: Antony Haste, MD;  Location: [facility];  Service: Urology;  Laterality: N/A;    History   Social History  . Marital Status: Married    Spouse Name: N/A    Number of Children: N/A  . Years of Education: N/A   Occupational History  . Not on file.   Social History Main Topics  . Smoking status: Former Smoker -- 1.00 packs/day for 10 years    Types: Cigarettes    Quit date: 08/02/1980  . Smokeless tobacco: Never Used  . Alcohol Use: No  . Drug Use: No  . Sexual Activity: Yes   Other Topics Concern  . Not on file   Social History Narrative  . No narrative on file    Family History  Problem Relation Age of Onset  . Arthritis Other   . Mental illness Other   . COPD Mother   . Depression Mother   . Deep vein thrombosis Father   . Depression Father     Current Facility-Administered Medications  Medication Dose Route Frequency Provider Last Rate Last Dose  . acetaminophen (TYLENOL) tablet 650 mg  650 mg Oral Q6H PRN Ardeth Sportsman, MD       Or  . acetaminophen (TYLENOL) suppository 650 mg  650 mg Rectal Q6H PRN Ardeth Sportsman, MD      . alum & mag hydroxide-simeth (MAALOX/MYLANTA) 200-200-20 MG/5ML suspension 30 mL  30 mL Oral Q6H PRN Ardeth Sportsman, MD      . bisacodyl (DULCOLAX) suppository 10 mg  10 mg Rectal Q12H PRN Ardeth Sportsman, MD      . dextrose 5 % and 0.45 % NaCl with KCl 40 mEq/L infusion   Intravenous Continuous Ardeth Sportsman, MD      . diphenhydrAMINE (BENADRYL) injection 12.5-25 mg  12.5-25 mg Intravenous Q6H PRN Ardeth Sportsman, MD       Or  . diphenhydrAMINE (BENADRYL) 12.5 MG/5ML elixir 12.5-25 mg  12.5-25 mg Oral Q6H PRN Ardeth Sportsman, MD      . fentaNYL (SUBLIMAZE) injection 25-50 mcg  25-50 mcg Intravenous Q1H PRN Ardeth Sportsman, MD      . heparin  injection 5,000 Units  5,000 Units Subcutaneous Q8H Ardeth Sportsman, MD      . HYDROmorphone (DILAUDID) injection 0.5-2 mg  0.5-2 mg Intravenous Q2H PRN Ardeth Sportsman, MD      . lactated ringers bolus 1,000 mL  1,000 mL Intravenous Once Ardeth Sportsman, MD      . lactated ringers bolus 1,000 mL  1,000 mL Intravenous Q8H PRN Ardeth Sportsman, MD      . lip balm (CARMEX) ointment 1 application  1 application Topical BID Ardeth Sportsman, MD      . magic mouthwash  15 mL Oral QID PRN Ardeth Sportsman, MD      . metoCLOPramide (REGLAN) injection 5-10 mg  5-10 mg Intravenous Q6H PRN Ardeth Sportsman, MD      . metoprolol (LOPRESSOR) injection 5 mg  5 mg Intravenous Q6H PRN Ardeth Sportsman, MD      . ondansetron [facility]) injection 4 mg  4 mg Intravenous Q6H PRN Ardeth Sportsman, MD      . promethazine (PHENERGAN) injection 12.5-25 mg  12.5-25 mg Intravenous Q6H PRN Ardeth Sportsman, MD       Current Outpatient Prescriptions  Medication Sig Dispense Refill  . ferrous fumarate (HEMOCYTE - 106 MG FE) 325 (106 FE) MG TABS tablet Take 1 tablet by mouth daily.      Marland Kitchen HYDROcodone-acetaminophen (NORCO/VICODIN) 5-325 MG per tablet Take 1 tablet by mouth every 6 (six) hours as needed for pain.      Marland Kitchen levothyroxine (SYNTHROID, LEVOTHROID) 175 MCG tablet Take 175 mcg by mouth daily before breakfast.      . losartan (COZAAR) 50 MG tablet Take 50 mg by mouth daily before breakfast.      . montelukast (SINGULAIR) 10 MG tablet Take 10 mg by mouth at bedtime.       . Multiple Vitamin (MULTIVITAMIN) tablet Take 1 tablet by mouth daily.      . sertraline (ZOLOFT) 100 MG tablet Take 150 mg by mouth every evening. Takes one and one half tablet to equal 150mg       . zolpidem (AMBIEN) 10 MG tablet Take 10 mg by mouth at bedtime as needed for sleep.      Marland Kitchen albuterol (PROVENTIL HFA;VENTOLIN HFA) 108 (90 BASE) MCG/ACT inhaler Inhale 2 puffs into the lungs every 4 (four) hours as needed for wheezing.  1 Inhaler  2     Allergies   Allergen Reactions  . Modafinil Rash    Under breasts only.    ROS: Constitutional:  No fevers, chills, sweats.  Weight stable Eyes:  No vision changes, No discharge HENT:  No sore throats, nasal drainage Lymph: No neck swelling, No bruising easily Pulmonary:  No cough, productive sputum CV: No orthopnea, PND  Patient walks 30 minutes for about 1 miles without difficulty.  No exertional chest/neck/shoulder/arm pain. GI: No personal nor family history of GI/colon cancer, inflammatory bowel disease, irritable bowel syndrome, allergy such as Celiac Sprue, dietary/dairy problems, colitis, ulcers nor gastritis.  No recent sick contacts/gastroenteritis.  No travel outside the country.  No changes in diet. Renal: No UTIs, No hematuria Genital:  No drainage, bleeding, masses Musculoskeletal: No severe joint pain.  Good ROM major joints Skin:  No sores or lesions.  No rashes Heme/Lymph:  No easy bleeding.  No swollen lymph nodes Neuro: No focal weakness/numbness.  No seizures Psych: No suicidal ideation.  No hallucinations  BP 137/74  Pulse 108  Temp(Src) 98.7 F (37.1 C) (Oral)  Resp 20  SpO2 100%  Physical Exam: General: Pt awake/alert/oriented x4 in mild major acute distress Eyes: PERRL, normal EOM. Sclera nonicteric Neuro: CN II-XII intact w/o focal sensory/motor deficits. Lymph: No head/neck/groin lymphadenopathy Psych:  No delerium/psychosis/paranoia HENT: Normocephalic, Mucus membranes moist.  No thrush.  Retching with NGT attempt - stopped it Neck: Supple, No tracheal deviation Chest: No pain.  Good respiratory excursion. CV:  Pulses intact.  Regular rhythm Abdomen: Soft, Moderately distended.  Mildly sensitive at large reducible VWH.  No incarcerated hernias. Ext:  SCDs BLE.  No significant edema.  No cyanosis Skin: No petechiae / purpurea.  No major sores Musculoskeletal: No severe joint pain.  Good ROM major joints   Results:  Labs: Results for orders placed during  the hospital encounter of 03/21/13 (from the past 48 hour(s))  CBC WITH DIFFERENTIAL     Status: Abnormal   Collection Time    03/21/13 12:54 AM      Result Value Range   WBC 13.1 (*) 4.0 - 10.5 K/uL   RBC 4.35  3.87 - 5.11 MIL/uL   Hemoglobin 12.2  12.0 - 15.0 g/dL   HCT 21.3  08.6 - 57.8 %   MCV 86.4  78.0 - 100.0 fL   MCH 28.0  26.0 - 34.0 pg   MCHC 32.4  30.0 - 36.0 g/dL   RDW 46.9  62.9 - 52.8 %   Platelets 231  150 - 400 K/uL   Neutrophils Relative % 71  43 - 77 %   Neutro Abs 9.3 (*) 1.7 - 7.7 K/uL   Lymphocytes Relative 21  12 - 46 %   Lymphs Abs 2.7  0.7 - 4.0 K/uL   Monocytes Relative 5  3 - 12 %   Monocytes Absolute 0.7  0.1 - 1.0 K/uL   Eosinophils Relative 3  0 - 5 %   Eosinophils Absolute 0.3  0.0 - 0.7 K/uL   Basophils Relative 0  0 - 1 %   Basophils Absolute 0.0  0.0 - 0.1 K/uL  COMPREHENSIVE METABOLIC PANEL     Status: Abnormal   Collection Time    03/21/13 12:54 AM      Result Value Range   Sodium 138  135 - 145 mEq/L   Potassium 4.3  3.5 - 5.1 mEq/L   Chloride 100  96 - 112 mEq/L   CO2 24  19 - 32 mEq/L   Glucose, Bld 114 (*) 70 - 99 mg/dL   BUN 24 (*) 6 - 23 mg/dL   Creatinine, Ser 4.13  0.50 - 1.10 mg/dL   Calcium 24.4 (*) 8.4 - 10.5 mg/dL   Total Protein 8.3  6.0 - 8.3 g/dL   Albumin 4.4  3.5 - 5.2 g/dL   AST 35  0 - 37 U/L   ALT 34  0 - 35 U/L   Alkaline Phosphatase 70  39 - 117 U/L   Total Bilirubin 0.3  0.3 - 1.2 mg/dL   GFR calc non Af Amer 54 (*) >90 mL/min   GFR calc Af Amer 63 (*) >90 mL/min   Comment: (NOTE)     The eGFR has been calculated using the CKD EPI equation.     This calculation has not been validated in all clinical situations.     eGFR's persistently <90 mL/min signify possible Chronic Kidney     Disease.  LIPASE, BLOOD     Status: None   Collection Time    03/21/13 12:54 AM      Result Value Range   Lipase 37  11 - 59 U/L  URINALYSIS, ROUTINE W REFLEX MICROSCOPIC     Status: Abnormal   Collection Time    03/21/13  1:20  AM      Result Value Range   Color, Urine YELLOW  YELLOW   APPearance CLOUDY (*) CLEAR   Specific Gravity, Urine 1.031 (*) 1.005 - 1.030   pH 5.0  5.0 - 8.0   Glucose, UA NEGATIVE  NEGATIVE mg/dL   Hgb urine dipstick LARGE (*) NEGATIVE   Bilirubin Urine NEGATIVE  NEGATIVE   Ketones, ur NEGATIVE  NEGATIVE mg/dL   Protein, ur NEGATIVE  NEGATIVE mg/dL   Urobilinogen, UA 0.2  0.0 - 1.0 mg/dL   Nitrite NEGATIVE  NEGATIVE   Leukocytes, UA LARGE (*) NEGATIVE  URINE MICROSCOPIC-ADD ON     Status: Abnormal   Collection Time    03/21/13  1:20 AM      Result Value Range   Squamous Epithelial / LPF FEW (*) RARE   WBC, UA TOO NUMEROUS TO COUNT  <3 WBC/hpf   RBC / HPF TOO NUMEROUS TO COUNT  <3 RBC/hpf   Bacteria, UA MANY (*) RARE   Crystals CA OXALATE CRYSTALS (*) NEGATIVE  LACTIC ACID, PLASMA     Status: Abnormal   Collection Time    03/21/13  3:10 AM      Result Value Range   Lactic Acid, Venous 2.5 (*) 0.5 - 2.2 mmol/L    Imaging / Studies: Ct Abdomen Pelvis Wo Contrast  03/21/2013   CLINICAL DATA:  Abdominal pain. Nausea. Known abdominal hernia. White cell count 13.1. Red and white cells in the urine. Previous history of multiple abdominal surgeries.  EXAM: CT ABDOMEN AND PELVIS WITHOUT CONTRAST  TECHNIQUE: Multidetector CT imaging of the abdomen and pelvis was performed following the standard protocol without intravenous contrast.  COMPARISON:  02/09/2013  FINDINGS: The lung bases are clear. Multiple low-attenuation lesions in the liver appear stable since previous study and probably represent cysts or hemangiomas. Spleen size is normal. The gallbladder, pancreas, and adrenal glands are normal. There is a small stone in the right renal pelvis measuring about 3 mm diameter. There is minimal proximal pyelocaliectasis without stranding. The distal right ureter is decompressed. No additional renal or ureteral stones are demonstrated. The 7 mm right renal pelvic stones seen previously is no longer  demonstrated. No bladder stones or bladder wall thickening. Low-attenuation lesions in both kidneys likely representing small cysts. The stomach is normal. There is mild distention of fluid-filled small bowel extending down to the right lower quadrant were there is a transition zone to decompressed terminal ileum. Transition zone appears to be in the right posterior pelvis. Changes are consistent with small bowel obstruction. There is a broad-based anterior abdominal wall hernia containing small and large bowel. This is not appear to be the cause of the obstruction. Colonic and bowel staples are present. No free air or free fluid in the abdomen.  Pelvis: The uterus appears to be surgically absent. No abnormal adnexal masses. No free or loculated pelvic fluid collections. The appendix is not identified. Normal alignment of the lumbar vertebrae.  IMPRESSION: Mild dilatation of fluid-filled small bowel with transition zone in the right lower pelvis consistent with small bowel obstruction. Broad-based anterior abdominal wall hernia is not appear to be the cause of the obstruction. 3 mm stone in the right ureteropelvic junction causing mild proximal obstruction.   Electronically Signed   By: Burman Nieves M.D.   On: 03/21/2013 05:44   Dg Abd Acute W/chest  03/21/2013   CLINICAL DATA:  Abdominal pain and nausea.  EXAM: ACUTE ABDOMEN SERIES (ABDOMEN 2 VIEW & CHEST 1 VIEW)  COMPARISON:  Chest 02/10/2013. Abdomen 02/25/2013.  FINDINGS: Pulmonary hyperinflation with mild interstitial fibrosis in the lungs. Heart size and pulmonary vascularity are normal. No focal airspace disease or consolidation in the lungs. No blunting of costophrenic angles. Mediastinal contours appear intact.  Mild gaseous distention of mid abdominal small bowel loops with a few air-fluid levels. Early or partial obstruction is not excluded. No free intra-abdominal air. No radiopaque stones. Colon is decompressed. Degenerative changes in the spine  and hips.  IMPRESSION: No evidence of active pulmonary disease. Mild gaseous distention of mid abdominal small bowel with a few air-fluid levels. Early or partial small bowel obstruction is not excluded.   Electronically Signed   By: Burman Nieves M.D.   On: 03/21/2013 02:41    Medications / Allergies: per chart  Antibiotics: Anti-infectives   None      Assessment  [name]  62 y.o. female       Problem List:  Principal Problem:   SBO (small bowel obstruction) Active Problems:   HYPOTHYROIDISM   DEPRESSION   HYPERTENSION   NEPHROLITHIASIS   Recurrent partial small bowel obstruction most likely due to adhesions.  Plan:  Admit  IV fluids  Nasogastric tube if worse.  Try and hold off and see if she will get better.  If not, allow surgical nursing to try.  Watch follow electrolytes.  Pathophysiology of SBO was explained with its natural history.  The patient and husband unfortunately have been through multiple admissions & operations, so they are familiar with the situation.  They expressed appreciation for the care in the emergency room and by surgery.  Hopefully this will improve nonoperatively.  The patient is stable.  There is no evidence of peritonitis, acute abdomen, nor shock.  There is no strong evidence of failure of improvement nor decline with current non-operative management.  There is no need for surgery at the present moment.  We will continue to follow.Like Dr. Rebecca Eaton.   The incisional hernia is soft and reducible.  Small bowel is right underneath the skin.  She would be at increased risk for enterotomy with an attempt to repair.  Certainly would like to avoid an emergent setting.  It is not the source of obstruction.  Overflow fecal incontinence seems to have improved over time.  Perhaps biofeedback training With the pelvic floor physical therapy team to be an option at a later time if she still struggles.  Again, that can be addressed in an  outpatient setting  -VTE prophylaxis- SCDs, etc  -mobilize as tolerated to help recovery    Ardeth Sportsman, M.D., F.A.C.S. Gastrointestinal and Minimally Invasive Surgery [facility] [address] [place], [place] [address] [phone] Main / Paging   03/21/2013

## 2013-03-21 NOTE — ED Provider Notes (Signed)
CSN: [id number]     Arrival date & time 03/21/13  0006 History   First MD Initiated Contact with Patient 03/21/13 0051     Chief Complaint  Patient presents with  . Abdominal Pain   HPI  History provided by the patient. Patient is a 62 year old female with history of hypertension, hyperlipidemia, hypothyroidism, diverticulitis, small bowel perforation, bowel resection and abdominal hernia who presents with complaints of worsening sharp severe abdominal pains. Pain first began in the afternoon and gradually worsened throughout the evening. Patient reports having a small bowel movement around 11 AM but has not had any additional regular bowel movements. Patient felt constipated and did take 3 Dulcolax stool softeners. She began to have increasing pain around her hernia. Symptoms were also associated with nausea. Denies any episodes of vomiting. No diarrhea or obstipation. No fever, chills or sweats. No urinary complaints. Patient also took one Vicodin with very minimal improvement of pain. No other aggravating or alleviating factors. No other associated symptoms.    Past Medical History  Diagnosis Date  . HYPOTHYROIDISM 10/06/2008  . HYPERLIPIDEMIA 10/06/2008  . DEPRESSION 02/23/2010  . ALLERGIC RHINITIS 07/23/2009  . Diverticulosis of colon 11/10/2008  . 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 Coumadin-all resolved. no coumadin since 11/09/11  . Pneumonia 11-17-11    1'13 postop pneumonia.   Marland Kitchen HYPERTENSION 10/06/2008    EKG 12/12 EPIC  . 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 Hospital-acquired pneumonia 05/28/2011  . Asthma 11-17-11    no flareups in adult yrs. Albuterol used rarely  . Chronic kidney disease 11-17-11    hx. past multiple kidney stones, prev. lithos  . Arthritis     osteoarthritis  . Anemia     no recently   Past Surgical History  Procedure Laterality Date  . Thyroid surgery  1982    tumor removed  .  Cesarean section  1983  . Rotator cuff repair  11-17-11    left '05  . Kidney stone surgery  2000, 2009, 2010  . Laparoscopic assissted total colectomy w/ j-pouch  03/01/11  . Ileostomy closure  05/02/2011    ileosomy takedown  . Laparotomy  05/14/2011    small bowel resection, ileostomy  . Abdominal hysterectomy  1986    fibroids  . Eye surgery      lasik  bilaterally  . Appendectomy    . Colon surgery  12/12    ileostomy   . Ileo loop colostomy closure  01/18/2012    Procedure: LAPAROSCOPIC ILEO LOOP COLOSTOMY CLOSURE;  Surgeon: Adolph Pollack, MD;  Location: [facility];  Service: General;  Laterality: N/A;  Laparoscopic Ileostomy Closure , lysis of adhesions   . Cystoscopy w/ ureteral stent placement Right 02/09/2013    Procedure: CYSTOSCOPY WITH RETROGRADE PYELOGRAM/URETERAL STENT PLACEMENT;  Surgeon: Antony Haste, MD;  Location: [facility];  Service: Urology;  Laterality: Right;  . Cystoscopy with ureteroscopy and stent placement Right 03/04/2013    Procedure: CYSTOSCOPY WITH URETEROSCOPY, LASER LITHOTRIPSY  AND STENT PLACEMENT;  Surgeon: Antony Haste, MD;  Location: [facility];  Service: Urology;  Laterality: Right;  . Holmium laser application N/A 03/04/2013    Procedure: HOLMIUM LASER APPLICATION;  Surgeon: Antony Haste, MD;  Location: [facility];  Service: Urology;  Laterality: N/A;   Family History  Problem Relation Age of Onset  . Arthritis Other   . Mental illness Other   . COPD Mother   .  Depression Mother   . Deep vein thrombosis Father   . Depression Father    History  Substance Use Topics  . Smoking status: Former Smoker -- 1.00 packs/day for 10 years    Types: Cigarettes    Quit date: 08/02/1980  . Smokeless tobacco: Never Used  . Alcohol Use: No   OB History   Grav Para Term Preterm Abortions TAB SAB Ect Mult Living                 Review of Systems  Constitutional: Negative for fever, chills and diaphoresis.  Respiratory: Negative for  shortness of breath.   Cardiovascular: Negative for chest pain.  Gastrointestinal: Positive for nausea, abdominal pain, diarrhea and constipation. Negative for vomiting and blood in stool.  Genitourinary: Negative for dysuria, frequency, hematuria and flank pain.  All other systems reviewed and are negative.    Allergies  Modafinil  Home Medications   Current Outpatient Rx  Name  Route  Sig  Dispense  Refill  . albuterol (PROVENTIL HFA;VENTOLIN HFA) 108 (90 BASE) MCG/ACT inhaler   Inhalation   Inhale 2 puffs into the lungs every 4 (four) hours as needed for wheezing.   1 Inhaler   2   . ciprofloxacin (CIPRO) 500 MG tablet   Oral   Take 500 mg by mouth 2 (two) times daily. Take 2 today, and 1 in AM of surgery.         . ferrous fumarate (HEMOCYTE - 106 MG FE) 325 (106 FE) MG TABS tablet   Oral   Take 1 tablet by mouth daily.         Marland Kitchen levothyroxine (SYNTHROID, LEVOTHROID) 175 MCG tablet   Oral   Take 175 mcg by mouth daily before breakfast.         . loratadine (CLARITIN) 10 MG tablet   Oral   Take 10 mg by mouth daily.         Marland Kitchen losartan (COZAAR) 50 MG tablet   Oral   Take 50 mg by mouth daily before breakfast.         . montelukast (SINGULAIR) 10 MG tablet   Oral   Take 10 mg by mouth at bedtime.          . Multiple Vitamin (MULTIVITAMIN) tablet   Oral   Take 1 tablet by mouth daily.         . Omega-3 Fatty Acids (FISH OIL) 1000 MG CAPS   Oral   Take 1 capsule by mouth daily.         Marland Kitchen oxyCODONE-acetaminophen (ROXICET) 5-325 MG per tablet   Oral   Take 1 tablet by mouth every 4 (four) hours as needed for pain.   30 tablet   0   . sertraline (ZOLOFT) 100 MG tablet   Oral   Take 150 mg by mouth every evening. Takes one and one half tablet to equal 150mg          . zolpidem (AMBIEN) 10 MG tablet   Oral   Take 10 mg by mouth at bedtime as needed for sleep.          BP 134/78  Pulse 100  Temp(Src) 98.7 F (37.1 C) (Oral)  Resp 18   SpO2 96% Physical Exam  Nursing note and vitals reviewed. Constitutional: She is oriented to person, place, and time. She appears well-developed and well-nourished. No distress.  HENT:  Head: Normocephalic.  Cardiovascular: Normal rate and regular rhythm.   Pulmonary/Chest: Effort  normal and breath sounds normal. No respiratory distress. She has no wheezes. She has no rales.  Abdominal: Soft. Bowel sounds are decreased. There is no rebound and no guarding. A hernia is present.    Midline abdominal surgical scar consistent with history of previous surgeries. Scar to the left abdomen consistent with history of ostomy. There is a large soft reducible hernia in the midline surgical scar. This area is very tender to palpation. Abdomen may be slightly distended. Bowel sounds decreased. There is diffuse tenderness.  Musculoskeletal: Normal range of motion.  Neurological: She is alert and oriented to person, place, and time.  Skin: Skin is warm and dry. No rash noted.  Psychiatric: She has a normal mood and affect. Her behavior is normal.    ED Course  Procedures   COORDINATION OF CARE:  Nursing notes reviewed. Vital signs reviewed. Initial pt interview and examination performed.   1:30 AM patient seen and evaluated. Patient appears uncomfortable. She does have large midline abdominal hernia that is tender to palpation. Hernia is very soft and reducible. Discussed work up and treatment plan with pt at bedside, which includes testing, acute abdominal x-rays and IM pain medications. Patient does not wish to have an IV until it may be necessary. Pt agrees with plan.   Patient discussed with attending physician. Given acute abdominal x-rays we'll proceed to CT scan.  Patient continues to have some recurrent nausea no episodes of vomiting. Able to tolerate by mouth contrast.  CT scan does show signs concerning for small bowel obstruction. NG tube ordered.  Spoke with Dr. Michaell Cowing with general  surgery. He will come and see patient and evaluate.   Treatment plan initiated: Medications  HYDROmorphone (DILAUDID) injection 1 mg (1 mg Intramuscular Given 03/21/13 0135)      Results for orders placed during the hospital encounter of 03/21/13  CBC WITH DIFFERENTIAL      Result Value Range   WBC 13.1 (*) 4.0 - 10.5 K/uL   RBC 4.35  3.87 - 5.11 MIL/uL   Hemoglobin 12.2  12.0 - 15.0 g/dL   HCT 16.1  09.6 - 04.5 %   MCV 86.4  78.0 - 100.0 fL   MCH 28.0  26.0 - 34.0 pg   MCHC 32.4  30.0 - 36.0 g/dL   RDW 40.9  81.1 - 91.4 %   Platelets 231  150 - 400 K/uL   Neutrophils Relative % 71  43 - 77 %   Neutro Abs 9.3 (*) 1.7 - 7.7 K/uL   Lymphocytes Relative 21  12 - 46 %   Lymphs Abs 2.7  0.7 - 4.0 K/uL   Monocytes Relative 5  3 - 12 %   Monocytes Absolute 0.7  0.1 - 1.0 K/uL   Eosinophils Relative 3  0 - 5 %   Eosinophils Absolute 0.3  0.0 - 0.7 K/uL   Basophils Relative 0  0 - 1 %   Basophils Absolute 0.0  0.0 - 0.1 K/uL  COMPREHENSIVE METABOLIC PANEL      Result Value Range   Sodium 138  135 - 145 mEq/L   Potassium 4.3  3.5 - 5.1 mEq/L   Chloride 100  96 - 112 mEq/L   CO2 24  19 - 32 mEq/L   Glucose, Bld 114 (*) 70 - 99 mg/dL   BUN 24 (*) 6 - 23 mg/dL   Creatinine, Ser 7.82  0.50 - 1.10 mg/dL   Calcium 95.6 (*) 8.4 - 10.5 mg/dL  Total Protein 8.3  6.0 - 8.3 g/dL   Albumin 4.4  3.5 - 5.2 g/dL   AST 35  0 - 37 U/L   ALT 34  0 - 35 U/L   Alkaline Phosphatase 70  39 - 117 U/L   Total Bilirubin 0.3  0.3 - 1.2 mg/dL   GFR calc non Af Amer 54 (*) >90 mL/min   GFR calc Af Amer 63 (*) >90 mL/min  LIPASE, BLOOD      Result Value Range   Lipase 37  11 - 59 U/L       Imaging Review Ct Abdomen Pelvis Wo Contrast  03/21/2013   CLINICAL DATA:  Abdominal pain. Nausea. Known abdominal hernia. White cell count 13.1. Red and white cells in the urine. Previous history of multiple abdominal surgeries.  EXAM: CT ABDOMEN AND PELVIS WITHOUT CONTRAST  TECHNIQUE: Multidetector CT  imaging of the abdomen and pelvis was performed following the standard protocol without intravenous contrast.  COMPARISON:  02/09/2013  FINDINGS: The lung bases are clear. Multiple low-attenuation lesions in the liver appear stable since previous study and probably represent cysts or hemangiomas. Spleen size is normal. The gallbladder, pancreas, and adrenal glands are normal. There is a small stone in the right renal pelvis measuring about 3 mm diameter. There is minimal proximal pyelocaliectasis without stranding. The distal right ureter is decompressed. No additional renal or ureteral stones are demonstrated. The 7 mm right renal pelvic stones seen previously is no longer demonstrated. No bladder stones or bladder wall thickening. Low-attenuation lesions in both kidneys likely representing small cysts. The stomach is normal. There is mild distention of fluid-filled small bowel extending down to the right lower quadrant were there is a transition zone to decompressed terminal ileum. Transition zone appears to be in the right posterior pelvis. Changes are consistent with small bowel obstruction. There is a broad-based anterior abdominal wall hernia containing small and large bowel. This is not appear to be the cause of the obstruction. Colonic and bowel staples are present. No free air or free fluid in the abdomen.  Pelvis: The uterus appears to be surgically absent. No abnormal adnexal masses. No free or loculated pelvic fluid collections. The appendix is not identified. Normal alignment of the lumbar vertebrae.  IMPRESSION: Mild dilatation of fluid-filled small bowel with transition zone in the right lower pelvis consistent with small bowel obstruction. Broad-based anterior abdominal wall hernia is not appear to be the cause of the obstruction. 3 mm stone in the right ureteropelvic junction causing mild proximal obstruction.   Electronically Signed   By: Burman Nieves M.D.   On: 03/21/2013 05:44   Dg Abd  Acute W/chest  03/21/2013   CLINICAL DATA:  Abdominal pain and nausea.  EXAM: ACUTE ABDOMEN SERIES (ABDOMEN 2 VIEW & CHEST 1 VIEW)  COMPARISON:  Chest 02/10/2013. Abdomen 02/25/2013.  FINDINGS: Pulmonary hyperinflation with mild interstitial fibrosis in the lungs. Heart size and pulmonary vascularity are normal. No focal airspace disease or consolidation in the lungs. No blunting of costophrenic angles. Mediastinal contours appear intact.  Mild gaseous distention of mid abdominal small bowel loops with a few air-fluid levels. Early or partial obstruction is not excluded. No free intra-abdominal air. No radiopaque stones. Colon is decompressed. Degenerative changes in the spine and hips.  IMPRESSION: No evidence of active pulmonary disease. Mild gaseous distention of mid abdominal small bowel with a few air-fluid levels. Early or partial small bowel obstruction is not excluded.   Electronically Signed  By: Burman Nieves M.D.   On: 03/21/2013 02:41     MDM   1. Small bowel obstruction   2. UTI (lower urinary tract infection)       Angus Seller, PA-C 03/21/13 [phone]

## 2013-03-21 NOTE — ED Notes (Signed)
Surgical consult at bedside

## 2013-03-21 NOTE — Progress Notes (Signed)
Utilization review completed.  

## 2013-03-21 NOTE — ED Notes (Signed)
Patient to CT.

## 2013-03-21 NOTE — ED Notes (Signed)
Patient medicated for c/o abdominal pain, see MAR Patient would like to wait for NG tube placement until medication has taken effect Will assess patient and place NG tube shortly Side rails up, call bell in reach

## 2013-03-21 NOTE — ED Notes (Signed)
PA at bedside   Patient with c/o abdominal pain which started in the afternoon on 10/23 Patient took Dulcolax and Hydrocone--which temporary relieved pain Patient states that she is unable to have full BM--last small BM was 1100 10/23 Patient with hx of multiple GI sx, SBO Patient with c/o nausea--denies vomiting Patient with hx of hernia--attempted to push back in, but TTP, so unable to

## 2013-03-21 NOTE — ED Notes (Addendum)
Attempted to place NG tube to right nare without success Per Dr. Michaell Cowing, NG tube to be placed once patient is admitted upstairs and less anxious Patient and pt's husband informed of POC--agree and v/u

## 2013-03-22 ENCOUNTER — Inpatient Hospital Stay (HOSPITAL_COMMUNITY): Payer: BC Managed Care – PPO

## 2013-03-22 LAB — BASIC METABOLIC PANEL
BUN: 11 mg/dL (ref 6–23)
CO2: 22 mEq/L (ref 19–32)
Calcium: 9.5 mg/dL (ref 8.4–10.5)
Creatinine, Ser: 0.85 mg/dL (ref 0.50–1.10)
GFR calc non Af Amer: 73 mL/min — ABNORMAL LOW (ref >90)
Glucose, Bld: 125 mg/dL — ABNORMAL HIGH (ref 70–99)
Potassium: 4.6 mEq/L (ref 3.5–5.1)

## 2013-03-22 LAB — CBC
Hemoglobin: 9.8 g/dL — ABNORMAL LOW (ref 12.0–15.0)
MCH: 27.2 pg (ref 26.0–34.0)
MCHC: 30.9 g/dL (ref 30.0–36.0)
RDW: 15.4 % (ref 11.5–15.5)
WBC: 6.9 10*3/uL (ref 4.0–10.5)

## 2013-03-22 LAB — URINE CULTURE

## 2013-03-22 NOTE — Progress Notes (Signed)
Subjective: Feeling better today.  Bowels moving.  Tolerating clear liquid diet.  Objective: Vital signs in last 24 hours: Temp:  [98.6 F (37 C)-99.2 F (37.3 C)] 98.6 F (37 C) (10/25 0554) Pulse Rate:  [99-120] 99 (10/25 0554) Resp:  [14-16] 14 (10/25 0554) BP: (107-129)/(66-89) 107/66 mmHg (10/25 0554) SpO2:  [94 %-97 %] 96 % (10/25 0554) Last BM Date: 03/21/13  Intake/Output from previous day: 10/24 0701 - 10/25 0700 In: 2040 [P.O.:240; I.V.:1800] Out: 19 [Urine:11; Stool:8] Intake/Output this shift: Total I/O In: 240 [P.O.:240] Out: 1 [Urine:1]  PE: General- In NAD Abdomen-soft, not distended, no significant tenderness, reducible, broad incisional hernia  Lab Results:   Recent Labs  03/21/13 0054 03/22/13 0536  WBC 13.1* 6.9  HGB 12.2 9.8*  HCT 37.6 31.7*  PLT 231 186   BMET  Recent Labs  03/21/13 0054 03/22/13 0536  NA 138 141  K 4.3 4.6  CL 100 111  CO2 24 22  GLUCOSE 114* 125*  BUN 24* 11  CREATININE 1.09 0.85  CALCIUM 11.3* 9.5   PT/INR No results found for this basename: LABPROT, INR,  in the last 72 hours Comprehensive Metabolic Panel:    Component Value Date/Time   NA 141 03/22/2013 0536   K 4.6 03/22/2013 0536   CL 111 03/22/2013 0536   CO2 22 03/22/2013 0536   BUN 11 03/22/2013 0536   CREATININE 0.85 03/22/2013 0536   GLUCOSE 125* 03/22/2013 0536   CALCIUM 9.5 03/22/2013 0536   CALCIUM 10.0 03/18/2012 1355   AST 35 03/21/2013 0054   ALT 34 03/21/2013 0054   ALKPHOS 70 03/21/2013 0054   BILITOT 0.3 03/21/2013 0054   PROT 8.3 03/21/2013 0054   ALBUMIN 4.4 03/21/2013 0054     Studies/Results: Ct Abdomen Pelvis Wo Contrast  03/21/2013   CLINICAL DATA:  Abdominal pain. Nausea. Known abdominal hernia. White cell count 13.1. Red and white cells in the urine. Previous history of multiple abdominal surgeries.  EXAM: CT ABDOMEN AND PELVIS WITHOUT CONTRAST  TECHNIQUE: Multidetector CT imaging of the abdomen and pelvis was  performed following the standard protocol without intravenous contrast.  COMPARISON:  02/09/2013  FINDINGS: The lung bases are clear. Multiple low-attenuation lesions in the liver appear stable since previous study and probably represent cysts or hemangiomas. Spleen size is normal. The gallbladder, pancreas, and adrenal glands are normal. There is a small stone in the right renal pelvis measuring about 3 mm diameter. There is minimal proximal pyelocaliectasis without stranding. The distal right ureter is decompressed. No additional renal or ureteral stones are demonstrated. The 7 mm right renal pelvic stones seen previously is no longer demonstrated. No bladder stones or bladder wall thickening. Low-attenuation lesions in both kidneys likely representing small cysts. The stomach is normal. There is mild distention of fluid-filled small bowel extending down to the right lower quadrant were there is a transition zone to decompressed terminal ileum. Transition zone appears to be in the right posterior pelvis. Changes are consistent with small bowel obstruction. There is a broad-based anterior abdominal wall hernia containing small and large bowel. This is not appear to be the cause of the obstruction. Colonic and bowel staples are present. No free air or free fluid in the abdomen.  Pelvis: The uterus appears to be surgically absent. No abnormal adnexal masses. No free or loculated pelvic fluid collections. The appendix is not identified. Normal alignment of the lumbar vertebrae.  IMPRESSION: Mild dilatation of fluid-filled small bowel with transition zone in the  right lower pelvis consistent with small bowel obstruction. Broad-based anterior abdominal wall hernia is not appear to be the cause of the obstruction. 3 mm stone in the right ureteropelvic junction causing mild proximal obstruction.   Electronically Signed   By: Burman Nieves M.D.   On: 03/21/2013 05:44   Dg Abd 2 Views  03/22/2013   CLINICAL DATA:   Abdominal pain with nausea  EXAM: ABDOMEN - 2 VIEW  COMPARISON:  CT abdomen and pelvis March 21, 2013  FINDINGS: Most of the bowel loops currently are fluid filled. There is no overt obstruction or free air seen. There are probable phleboliths in the pelvis.  IMPRESSION: Most bowel loops are fluid-filled. In light of findings on recent CT, this finding could be indicative of a degree of underlying obstruction, although ileus and enteritis are differential considerations. No free air.   Electronically Signed   By: Bretta Bang M.D.   On: 03/22/2013 07:59   Dg Abd Acute W/chest  03/21/2013   CLINICAL DATA:  Abdominal pain and nausea.  EXAM: ACUTE ABDOMEN SERIES (ABDOMEN 2 VIEW & CHEST 1 VIEW)  COMPARISON:  Chest 02/10/2013. Abdomen 02/25/2013.  FINDINGS: Pulmonary hyperinflation with mild interstitial fibrosis in the lungs. Heart size and pulmonary vascularity are normal. No focal airspace disease or consolidation in the lungs. No blunting of costophrenic angles. Mediastinal contours appear intact.  Mild gaseous distention of mid abdominal small bowel loops with a few air-fluid levels. Early or partial obstruction is not excluded. No free intra-abdominal air. No radiopaque stones. Colon is decompressed. Degenerative changes in the spine and hips.  IMPRESSION: No evidence of active pulmonary disease. Mild gaseous distention of mid abdominal small bowel with a few air-fluid levels. Early or partial small bowel obstruction is not excluded.   Electronically Signed   By: Burman Nieves M.D.   On: 03/21/2013 02:41    Anti-infectives: Anti-infectives   None      Assessment Principal Problem:   Partial SBO (small bowel obstruction)-appears to have resolved rapidly; no significant small bowel dilatation on my view of x-rays. Active Problems:   HYPOTHYROIDISM   DEPRESSION   HYPERTENSION   NEPHROLITHIASIS   Fecal incontinence   Recurrent ventral incisional hernia    LOS: 1 day   Plan: Advance  to bland diet.  If tolerated, home tomorrow.  Dietary suggestions given to her.   Tudor Chandley J 03/22/2013

## 2013-03-23 ENCOUNTER — Encounter (HOSPITAL_COMMUNITY): Payer: Self-pay | Admitting: *Deleted

## 2013-03-23 NOTE — Progress Notes (Signed)
  Subjective: Tolerating solid diet.  Bowels continue to move.  Objective: Vital signs in last 24 hours: Temp:  [98.3 F (36.8 C)-98.5 F (36.9 C)] 98.3 F (36.8 C) (10/26 0500) Pulse Rate:  [88-98] 88 (10/26 0500) Resp:  [15-16] 16 (10/26 0500) BP: (132-138)/(84-91) 138/91 mmHg (10/26 0500) SpO2:  [95 %-99 %] 95 % (10/26 0500) Last BM Date: 03/29/2013  Intake/Output from previous day: 03/30/2023 0701 - 10/26 0700 In: 2760 [P.O.:960; I.V.:1800] Out: 1 [Urine:1] Intake/Output this shift:    PE: General- In NAD Abdomen-soft, nontender,reducible midline incisional hernia  Lab Results:   Recent Labs  03/21/13 0054 2013/03/29 0536  WBC 13.1* 6.9  HGB 12.2 9.8*  HCT 37.6 31.7*  PLT 231 186   BMET  Recent Labs  03/21/13 0054 03/29/2013 0536  NA 138 141  K 4.3 4.6  CL 100 111  CO2 24 22  GLUCOSE 114* 125*  BUN 24* 11  CREATININE 1.09 0.85  CALCIUM 11.3* 9.5   PT/INR No results found for this basename: LABPROT, INR,  in the last 72 hours Comprehensive Metabolic Panel:    Component Value Date/Time   NA 141 03-29-2013 0536   K 4.6 03-29-13 0536   CL 111 03/29/13 0536   CO2 22 March 29, 2013 0536   BUN 11 03-29-13 0536   CREATININE 0.85 2013-03-29 0536   GLUCOSE 125* 03-29-13 0536   CALCIUM 9.5 03-29-2013 0536   CALCIUM 10.0 03/18/2012 1355   AST 35 03/21/2013 0054   ALT 34 03/21/2013 0054   ALKPHOS 70 03/21/2013 0054   BILITOT 0.3 03/21/2013 0054   PROT 8.3 03/21/2013 0054   ALBUMIN 4.4 03/21/2013 0054     Studies/Results: Dg Abd 2 Views  03/29/13   CLINICAL DATA:  Abdominal pain with nausea  EXAM: ABDOMEN - 2 VIEW  COMPARISON:  CT abdomen and pelvis March 21, 2013  FINDINGS: Most of the bowel loops currently are fluid filled. There is no overt obstruction or free air seen. There are probable phleboliths in the pelvis.  IMPRESSION: Most bowel loops are fluid-filled. In light of findings on recent CT, this finding could be indicative of a degree of  underlying obstruction, although ileus and enteritis are differential considerations. No free air.   Electronically Signed   By: Bretta Bang M.D.   On: 03/29/13 07:59    Anti-infectives: Anti-infectives   None      Assessment Principal Problem:   Partial SBO (small bowel obstruction)=-resolved.   Chronic anemia-her PCP is aware of this   NEPHROLITHIASIS   Recurrent ventral incisional hernia    LOS: 2 days   Plan: Discharge.  Instructions given.  I encouraged her to speak with her PCP about her anemia.   Alajiah Dutkiewicz J 03/23/2013

## 2013-04-03 ENCOUNTER — Encounter: Payer: Self-pay | Admitting: Family Medicine

## 2013-04-03 ENCOUNTER — Ambulatory Visit (INDEPENDENT_AMBULATORY_CARE_PROVIDER_SITE_OTHER): Payer: BC Managed Care – PPO | Admitting: Family Medicine

## 2013-04-03 VITALS — BP 126/68 | HR 90 | Temp 98.2°F | Wt 185.0 lb

## 2013-04-03 DIAGNOSIS — K56609 Unspecified intestinal obstruction, unspecified as to partial versus complete obstruction: Secondary | ICD-10-CM

## 2013-04-03 DIAGNOSIS — D649 Anemia, unspecified: Secondary | ICD-10-CM

## 2013-04-03 NOTE — Progress Notes (Signed)
Subjective:    Patient ID: [name], female    DOB: 02/03/1951, 62 y.o.   MRN: [id number]  HPI Hospital followup Patient was admitted on 10/24 and discharged on 03/23/2013 with small bowel obstruction. History is that she has had previous abdominal colectomy. She had history of recurrent multifocal diverticulitis. She had complications from surgery couple of years ago. She had been doing relatively well until couple months ago and she had small bowel obstruction and then again this episode. This was confirmed by CT scan. Patient was managed conservatively without NG-tube and is back to her usual state at this time. Appetite is good. Weight has been maintained.  She had elevated white count 13.1 thousand on admission with hemoglobin 12.2. Hemoglobin following day was 9.8. No history of peptic ulcer disease. No bloody stools. She has the fairly long history of anemia following her multiple surgeries and this is been normocytic anemia. Iron studies and B12 were normal several months ago. She does still take some iron supplementation but has never had any proven iron deficiency. We have suspected anemia of chronic disease. She does not have any history of renal disease. No history of thrombocytopenia or leukopenia  Past Medical History  Diagnosis Date  . HYPOTHYROIDISM 10/06/2008  . HYPERLIPIDEMIA 10/06/2008  . DEPRESSION 02/23/2010  . ALLERGIC RHINITIS 07/23/2009  . 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 Coumadin-all resolved. no coumadin since 11/09/11  . Pneumonia 11-17-11    1'13 postop pneumonia.   Marland Kitchen HYPERTENSION 10/06/2008    EKG 12/12 EPIC  . 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 Hospital-acquired pneumonia 05/28/2011  . Asthma 11-17-11    no flareups in adult yrs. Albuterol used rarely  . Chronic kidney disease 11-17-11    hx. past multiple kidney stones, prev. lithos  . Arthritis     osteoarthritis  . Anemia     no recently  . NEPHROLITHIASIS 10/06/2008    Qualifier: Diagnosis of  By: Caryl Never MD, Ihan Pat     Past Surgical History  Procedure Laterality Date  . Thyroid surgery  1982    tumor removed  . Cesarean section  1983  . Rotator cuff repair  11-17-11    left '05  . Kidney stone surgery  2000, 2009, 2010  . Laparoscopic assissted total colectomy w/ j-pouch  03/01/11    for pandiverticulosis/itis  . Ileostomy closure  05/02/2011    ileosomy takedown  . Creation / revision of ileostomy / jejunostomy  05/14/2011    small bowel resection, ileostomy  . Abdominal hysterectomy  1986    fibroids  . Eye surgery      lasik  bilaterally  . Appendectomy    . 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 Ileo loop colostomy closure  01/18/2012    Procedure: LAPAROSCOPIC ILEO LOOP COLOSTOMY CLOSURE;  Surgeon: Adolph Pollack, MD;  Location: [facility];  Service: General;  Laterality: N/A;  Laparoscopic Ileostomy Closure , lysis of adhesions   . Cystoscopy w/ ureteral stent placement Right 02/09/2013    Procedure: CYSTOSCOPY WITH RETROGRADE PYELOGRAM/URETERAL STENT PLACEMENT;  Surgeon: Antony Haste, MD;  Location: [facility];  Service: Urology;  Laterality: Right;  . Cystoscopy with ureteroscopy and stent placement Right 03/04/2013    Procedure: CYSTOSCOPY WITH URETEROSCOPY, LASER LITHOTRIPSY  AND STENT PLACEMENT;  Surgeon: Antony Haste,  MD;  Location: [facility];  Service: Urology;  Laterality: Right;  . Holmium laser application N/A 03/04/2013    Procedure: HOLMIUM LASER APPLICATION;  Surgeon: Antony Haste, MD;  Location: [facility];  Service: Urology;  Laterality: N/A;    reports that she quit smoking about 32 years ago. Her smoking use included Cigarettes. She has a 10 pack-year smoking history. She has never used smokeless tobacco. She reports that she does not drink alcohol or use illicit  drugs. family history includes Arthritis in her other; COPD in her mother; Deep vein thrombosis in her father; Depression in her father and mother; Mental illness in her other. Allergies  Allergen Reactions  . Modafinil Rash    Under breasts only.      Review of Systems  Constitutional: Negative for fever, chills, appetite change and unexpected weight change.  Respiratory: Negative for shortness of breath.   Cardiovascular: Negative for chest pain.  Gastrointestinal: Negative for nausea, vomiting, abdominal pain, diarrhea, constipation and abdominal distention.  Neurological: Negative for dizziness.       Objective:   Physical Exam  Constitutional: She appears well-developed and well-nourished.  Cardiovascular: Normal rate and regular rhythm.   Pulmonary/Chest: Effort normal and breath sounds normal. No respiratory distress. She has no wheezes. She has no rales.  Abdominal: Soft. Bowel sounds are normal. She exhibits no distension. There is no tenderness. There is no rebound and no guarding.  Patient has abdominal wall hernia which is soft and nontender. No other masses          Assessment & Plan:  #1 recurrent small bowel obstruction. Recent hospitalization with conservative management. Doing well this time. She is aware this is likely related to adhesions from previous surgery. She is fully aware of signs and symptoms to watch out for for recurrence #2 normocytic anemia. Suspect" anemia of chronic disease". Recent iron studies normal. Repeat CBC. Suspect this will not correct with iron replacement. Suspect hgb on recent admission of 12.2 falsely high secondary to some dehydration.

## 2013-04-04 LAB — CBC WITH DIFFERENTIAL/PLATELET
Basophils Absolute: 0.1 10*3/uL (ref 0.0–0.1)
Basophils Relative: 0.7 % (ref 0.0–3.0)
Eosinophils Absolute: 0.3 10*3/uL (ref 0.0–0.7)
Lymphs Abs: 2.2 10*3/uL (ref 0.7–4.0)
MCHC: 33.4 g/dL (ref 30.0–36.0)
MCV: 84.2 fl (ref 78.0–100.0)
Monocytes Absolute: 0.5 10*3/uL (ref 0.1–1.0)
Neutrophils Relative %: 62.1 % (ref 43.0–77.0)
Platelets: 220 10*3/uL (ref 150.0–400.0)
RDW: 15.6 % — ABNORMAL HIGH (ref 11.5–14.6)

## 2013-04-07 ENCOUNTER — Other Ambulatory Visit: Payer: Self-pay | Admitting: Family Medicine

## 2013-04-07 ENCOUNTER — Ambulatory Visit: Payer: BC Managed Care – PPO | Admitting: Family Medicine

## 2013-04-07 DIAGNOSIS — D649 Anemia, unspecified: Secondary | ICD-10-CM

## 2013-04-10 NOTE — Discharge Summary (Signed)
Physician Discharge Summary  Patient ID: [name] MRN: [id number] DOB/AGE: 1950/07/18 62 y.o.  Admit date: 03/21/2013 Discharge date: 03/23/2013  Admission Diagnoses:  Partial small bowel  Discharge Diagnoses:  Principal Problem:   SBO (small bowel obstruction)-partial Active Problems:   HYPOTHYROIDISM   DEPRESSION   HYPERTENSION   NEPHROLITHIASIS   Fecal incontinence   Recurrent ventral incisional hernia   Discharged Condition: good  Hospital Course: she was admitted with a picture of partial small bowel obstruction. She rapidly improved and her bowels began working. Her diet was rapidly advanced and she is able to be discharged 2 days after her admission. She was given dietary instructions. Will follow up with [place] when necessary. Consults: None  Significant Diagnostic Studies: none  Treatments: none  Discharge Exam: Blood pressure 138/91, pulse 88, temperature 98.3 F (36.8 C), temperature source Oral, resp. rate 16, height 5\' 8"  (1.727 m), weight 184 lb (83.462 kg), SpO2 95.00%.   Disposition: 01-Home or Self Care     Medication List         albuterol 108 (90 BASE) MCG/ACT inhaler  Commonly known as:  PROVENTIL HFA;VENTOLIN HFA  Inhale 2 puffs into the lungs every 4 (four) hours as needed for wheezing.     ferrous fumarate 325 (106 FE) MG Tabs tablet  Commonly known as:  HEMOCYTE - 106 mg FE  Take 1 tablet by mouth daily.     HYDROcodone-acetaminophen 5-325 MG per tablet  Commonly known as:  NORCO/VICODIN  Take 1 tablet by mouth every 6 (six) hours as needed for pain.     levothyroxine 175 MCG tablet  Commonly known as:  SYNTHROID, LEVOTHROID  Take 175 mcg by mouth daily before breakfast.     losartan 50 MG tablet  Commonly known as:  COZAAR  Take 50 mg by mouth daily before breakfast.     multivitamin tablet  Take 1 tablet by mouth daily.     sertraline 100 MG tablet  Commonly known as:  ZOLOFT  Take 150 mg by mouth every evening. Takes  one and one half tablet to equal 150mg      SINGULAIR 10 MG tablet  Generic drug:  montelukast  Take 10 mg by mouth at bedtime.     zolpidem 10 MG tablet  Commonly known as:  AMBIEN  Take 10 mg by mouth at bedtime as needed for sleep.         Signed: Adolph Pollack 04/10/2013, 10:05 AM

## 2013-04-11 ENCOUNTER — Ambulatory Visit (INDEPENDENT_AMBULATORY_CARE_PROVIDER_SITE_OTHER): Payer: BC Managed Care – PPO | Admitting: Family Medicine

## 2013-04-11 DIAGNOSIS — Z23 Encounter for immunization: Secondary | ICD-10-CM

## 2013-04-11 DIAGNOSIS — Z2911 Encounter for prophylactic immunotherapy for respiratory syncytial virus (RSV): Secondary | ICD-10-CM

## 2013-05-23 ENCOUNTER — Other Ambulatory Visit: Payer: Self-pay | Admitting: Family Medicine

## 2013-05-26 NOTE — Telephone Encounter (Signed)
Last visit 04-03-13 Last refill 12/03/12 #30 5 refill

## 2013-05-26 NOTE — Telephone Encounter (Signed)
Refill for 6 months. 

## 2013-06-04 ENCOUNTER — Encounter: Payer: Self-pay | Admitting: Family Medicine

## 2013-06-04 ENCOUNTER — Ambulatory Visit (INDEPENDENT_AMBULATORY_CARE_PROVIDER_SITE_OTHER): Payer: BC Managed Care – PPO | Admitting: Family Medicine

## 2013-06-04 VITALS — BP 124/80 | HR 104 | Temp 98.6°F | Wt 187.0 lb

## 2013-06-04 DIAGNOSIS — R7309 Other abnormal glucose: Secondary | ICD-10-CM

## 2013-06-04 DIAGNOSIS — M7072 Other bursitis of hip, left hip: Secondary | ICD-10-CM

## 2013-06-04 DIAGNOSIS — M2550 Pain in unspecified joint: Secondary | ICD-10-CM

## 2013-06-04 DIAGNOSIS — E039 Hypothyroidism, unspecified: Secondary | ICD-10-CM

## 2013-06-04 DIAGNOSIS — M76899 Other specified enthesopathies of unspecified lower limb, excluding foot: Secondary | ICD-10-CM

## 2013-06-04 DIAGNOSIS — R739 Hyperglycemia, unspecified: Secondary | ICD-10-CM

## 2013-06-04 NOTE — Progress Notes (Signed)
Pre visit review using our clinic review tool, if applicable. No additional management support is needed unless otherwise documented below in the visit note. 

## 2013-06-04 NOTE — Patient Instructions (Signed)
Hip Bursitis Bursitis is a swelling and soreness (inflammation) of a fluid-filled sac (bursa). This sac overlies and protects the joints.  CAUSES   Injury.  Overuse of the muscles surrounding the joint.  Arthritis.  Gout.  Infection.  Cold weather.  Inadequate warm-up and conditioning prior to activities. The cause may not be known.  SYMPTOMS   Mild to severe irritation.  Tenderness and swelling over the outside of the hip.  Pain with motion of the hip.  If the bursa becomes infected, a fever may be present. Redness, tenderness, and warmth will develop over the hip. Symptoms usually lessen in 3 to 4 weeks with treatment, but can come back. TREATMENT If conservative treatment does not work, your caregiver may advise draining the bursa and injecting cortisone into the area. This may speed up the healing process. This may also be used as an initial treatment of choice. HOME CARE INSTRUCTIONS   Apply ice to the affected area for 15-20 minutes every 3 to 4 hours while awake for the first 2 days. Put the ice in a plastic bag and place a towel between the bag of ice and your skin.  Rest the painful joint as much as possible, but continue to put the joint through a normal range of motion at least 4 times per day. When the pain lessens, begin normal, slow movements and usual activities to help prevent stiffness of the hip.  Only take over-the-counter or prescription medicines for pain, discomfort, or fever as directed by your caregiver.  Use crutches to limit weight bearing on the hip joint, if advised.  Elevate your painful hip to reduce swelling. Use pillows for propping and cushioning your legs and hips.  Gentle massage may provide comfort and decrease swelling. SEEK IMMEDIATE MEDICAL CARE IF:   Your pain increases even during treatment, or you are not improving.  You have a fever.  You have heat and inflammation over the involved bursa.  You have any other questions or  concerns. MAKE SURE YOU:   Understand these instructions.  Will watch your condition.  Will get help right away if you are not doing well or get worse. Document Released: 11/04/2001 Document Revised: 08/07/2011 Document Reviewed: 06/03/2008 [facility] Patient Information 2014 [place], [place].  ?granuloma annulare right thigh.

## 2013-06-04 NOTE — Progress Notes (Signed)
Subjective:    Patient ID: [name], female    DOB: Dec 30, 1950, 63 y.o.   MRN: [id number]  HPI Patient seen with multiple issues as follows  Bilateral hand pain for several months. She went several months ago to hand surgeon and had x-rays which revealed osteoarthritis. Since that time she's had some progressive swelling mostly in MCP joints and per normally left-hand second MCP joint. She has noticed some mild swelling along with pain and occasional warmth. She's tried Aleve and Mobic with minimal relief.    She also reports some bilateral hip pain is mostly lateral hip left greater than right. No injury. She is not any prior history of inflammatory arthritis. She does have a right thigh rash which is asymptomatic and nonscaly which is been present for several weeks. No other rashes reported. No recent fever. No recent appetite or weight changes.  Hypothyroidism and needs repeat TSH.  Past Medical History  Diagnosis Date  . HYPOTHYROIDISM 10/06/2008  . HYPERLIPIDEMIA 10/06/2008  . DEPRESSION 02/23/2010  . ALLERGIC RHINITIS 07/23/2009  . 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 Coumadin-all resolved. no coumadin since 11/09/11  . Pneumonia 11-17-11    1'13 postop pneumonia.   Marland Kitchen HYPERTENSION 10/06/2008    EKG 12/12 EPIC  . 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 Hospital-acquired pneumonia 05/28/2011  . Asthma 11-17-11    no flareups in adult yrs. Albuterol used rarely  . Chronic kidney disease 11-17-11    hx. past multiple kidney stones, prev. lithos  . Arthritis     osteoarthritis  . Anemia     no recently  . NEPHROLITHIASIS 10/06/2008    Qualifier: Diagnosis of  By: Elease Hashimoto MD, Gionni Vaca     Past Surgical History  Procedure Laterality Date  . Thyroid surgery  1982    tumor removed  . Cesarean section  1983  .  Rotator cuff repair  11-17-11    left '05  . Kidney stone surgery  2000, 2009, 2010  . Laparoscopic assissted total colectomy w/ j-pouch  03/01/11    for pandiverticulosis/itis  . Ileostomy closure  05/02/2011    ileosomy takedown  . Creation / revision of ileostomy / jejunostomy  05/14/2011    small bowel resection, ileostomy  . Abdominal hysterectomy  1986    fibroids  . Eye surgery      lasik  bilaterally  . Appendectomy    . 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 Ileo loop colostomy closure  01/18/2012    Procedure: LAPAROSCOPIC ILEO LOOP COLOSTOMY CLOSURE;  Surgeon: Odis Hollingshead, MD;  Location: [facility];  Service: General;  Laterality: N/A;  Laparoscopic Ileostomy Closure , lysis of adhesions   . Cystoscopy w/ ureteral stent placement Right 02/09/2013    Procedure: CYSTOSCOPY WITH RETROGRADE PYELOGRAM/URETERAL STENT PLACEMENT;  Surgeon: Fredricka Bonine, MD;  Location: [facility];  Service: Urology;  Laterality: Right;  . Cystoscopy with ureteroscopy and stent placement Right 03/04/2013    Procedure: CYSTOSCOPY WITH URETEROSCOPY, LASER LITHOTRIPSY  AND STENT PLACEMENT;  Surgeon: Fredricka Bonine, MD;  Location: [facility];  Service: Urology;  Laterality: Right;  . Holmium laser application N/A 71/06/4578    Procedure: HOLMIUM LASER APPLICATION;  Surgeon: Fredricka Bonine, MD;  Location: [facility];  Service: Urology;  Laterality: N/A;    reports  that she quit smoking about 32 years ago. Her smoking use included Cigarettes. She has a 10 pack-year smoking history. She has never used smokeless tobacco. She reports that she does not drink alcohol or use illicit drugs. family history includes Arthritis in her other; COPD in her mother; Deep vein thrombosis in her father; Depression in her father and mother; Mental illness in her other. Allergies  Allergen Reactions  . Modafinil Rash    Under breasts only.      Review of Systems  Constitutional: Negative for fever,  chills and fatigue.  Respiratory: Negative for shortness of breath.   Cardiovascular: Negative for chest pain.  Musculoskeletal: Positive for arthralgias and joint swelling. Negative for back pain, myalgias and neck stiffness.  Skin: Positive for rash.       Objective:   Physical Exam  Constitutional: She appears well-developed and well-nourished.  Neck: Neck supple. No thyromegaly present.  Cardiovascular: Normal rate.   Pulmonary/Chest: Effort normal and breath sounds normal. No respiratory distress. She has no wheezes. She has no rales.  Musculoskeletal: She exhibits no edema.  Full range of motion both hips. She has tenderness over the left greater trochanteric bursa.  Hands reveal minimal tenderness of the MCP joints especially left second MCP joint. She has some possible very mild edema involving the left hand MCP joints but no warmth and no erythema.  Skin:  Patient has somewhat oval-shaped nonscaly erythematous rash with clearing of the center and minimally elevated border. About 3-4 cm in size.          Assessment & Plan:  #1 bilateral hand pain. Given distribution in MCP joints and bilateral nature rule out inflammatory arthritis. Lab work obtained #2 left lateral hip pain. Greater trochanteric bursitis. We discussed risks and benefits of corticosteroid injection patient consented.  Skin over her greater trochanteric bursa prepped with Betadine. Using sterile technique injected 40 mg of Depo-Medrol and 2 cc of plain Xylocaine using 25-gauge 1 inch needle. Patient tolerated well. She'll try some icing for the next couple days. Followup if not resolving over the next couple weeks #3 right thigh rash. Question granuloma annulare. Doubt tinea corporis as this nonscaly. #4 hypothyroidism. Recheck TSH

## 2013-06-05 LAB — CBC WITH DIFFERENTIAL/PLATELET
Basophils Absolute: 0 10*3/uL (ref 0.0–0.1)
Basophils Relative: 0.6 % (ref 0.0–3.0)
EOS PCT: 3.5 % (ref 0.0–5.0)
Eosinophils Absolute: 0.2 10*3/uL (ref 0.0–0.7)
HEMATOCRIT: 34.1 % — AB (ref 36.0–46.0)
HEMOGLOBIN: 11.6 g/dL — AB (ref 12.0–15.0)
LYMPHS ABS: 1.6 10*3/uL (ref 0.7–4.0)
Lymphocytes Relative: 23.8 % (ref 12.0–46.0)
MCHC: 34.2 g/dL (ref 30.0–36.0)
MCV: 84.3 fl (ref 78.0–100.0)
MONO ABS: 0.4 10*3/uL (ref 0.1–1.0)
Monocytes Relative: 6.1 % (ref 3.0–12.0)
NEUTROS ABS: 4.5 10*3/uL (ref 1.4–7.7)
Neutrophils Relative %: 66 % (ref 43.0–77.0)
PLATELETS: 210 10*3/uL (ref 150.0–400.0)
RBC: 4.05 Mil/uL (ref 3.87–5.11)
RDW: 15.4 % — ABNORMAL HIGH (ref 11.5–14.6)
WBC: 6.8 10*3/uL (ref 4.5–10.5)

## 2013-06-05 LAB — ANTI-NUCLEAR AB-TITER (ANA TITER): ANA Titer 1: NEGATIVE

## 2013-06-05 LAB — RHEUMATOID FACTOR: Rhuematoid fact SerPl-aCnc: <10 IU/mL (ref <=14)

## 2013-06-05 LAB — CYCLIC CITRUL PEPTIDE ANTIBODY, IGG: Cyclic Citrullin Peptide Ab: <2 U/mL (ref 0.0–5.0)

## 2013-06-05 LAB — HEMOGLOBIN A1C: Hgb A1c MFr Bld: 5.7 % (ref 4.6–6.5)

## 2013-06-05 LAB — ANA: ANA: POSITIVE — AB

## 2013-06-05 LAB — TSH: TSH: 0.1 u[IU]/mL — AB (ref 0.35–5.50)

## 2013-06-06 ENCOUNTER — Other Ambulatory Visit: Payer: Self-pay | Admitting: Family Medicine

## 2013-06-06 ENCOUNTER — Other Ambulatory Visit: Payer: Self-pay

## 2013-06-06 DIAGNOSIS — E039 Hypothyroidism, unspecified: Secondary | ICD-10-CM

## 2013-06-06 MED ORDER — LEVOTHYROXINE SODIUM 150 MCG PO TABS: 150.0000 ug | ORAL_TABLET | Freq: Every day | ORAL | Status: DC

## 2013-07-02 ENCOUNTER — Encounter: Payer: Self-pay | Admitting: Family Medicine

## 2013-07-03 ENCOUNTER — Emergency Department (HOSPITAL_COMMUNITY): Payer: BC Managed Care – PPO

## 2013-07-03 ENCOUNTER — Encounter (HOSPITAL_COMMUNITY): Payer: Self-pay | Admitting: Emergency Medicine

## 2013-07-03 ENCOUNTER — Inpatient Hospital Stay (HOSPITAL_COMMUNITY)
Admission: EM | Admit: 2013-07-03 | Discharge: 2013-07-05 | DRG: 390 | Disposition: A | Discharge status: Home / Self Care | Payer: BC Managed Care – PPO | Attending: General Surgery | Admitting: General Surgery

## 2013-07-03 DIAGNOSIS — K439 Ventral hernia without obstruction or gangrene: Secondary | ICD-10-CM | POA: Diagnosis present

## 2013-07-03 DIAGNOSIS — Z9049 Acquired absence of other specified parts of digestive tract: Secondary | ICD-10-CM

## 2013-07-03 DIAGNOSIS — Z87442 Personal history of urinary calculi: Secondary | ICD-10-CM

## 2013-07-03 DIAGNOSIS — R159 Full incontinence of feces: Secondary | ICD-10-CM | POA: Diagnosis present

## 2013-07-03 DIAGNOSIS — I10 Essential (primary) hypertension: Secondary | ICD-10-CM | POA: Diagnosis present

## 2013-07-03 DIAGNOSIS — F3289 Other specified depressive episodes: Secondary | ICD-10-CM | POA: Diagnosis present

## 2013-07-03 DIAGNOSIS — F329 Major depressive disorder, single episode, unspecified: Secondary | ICD-10-CM | POA: Diagnosis present

## 2013-07-03 DIAGNOSIS — E785 Hyperlipidemia, unspecified: Secondary | ICD-10-CM | POA: Diagnosis present

## 2013-07-03 DIAGNOSIS — K565 Intestinal adhesions [bands], unspecified as to partial versus complete obstruction: Principal | ICD-10-CM | POA: Diagnosis present

## 2013-07-03 DIAGNOSIS — E039 Hypothyroidism, unspecified: Secondary | ICD-10-CM | POA: Diagnosis present

## 2013-07-03 DIAGNOSIS — K432 Incisional hernia without obstruction or gangrene: Secondary | ICD-10-CM | POA: Diagnosis present

## 2013-07-03 DIAGNOSIS — K56609 Unspecified intestinal obstruction, unspecified as to partial versus complete obstruction: Secondary | ICD-10-CM | POA: Diagnosis present

## 2013-07-03 DIAGNOSIS — Z87891 Personal history of nicotine dependence: Secondary | ICD-10-CM

## 2013-07-03 LAB — COMPREHENSIVE METABOLIC PANEL
ALK PHOS: 76 U/L (ref 39–117)
ALT: 34 U/L (ref 0–35)
AST: 29 U/L (ref 0–37)
Albumin: 4.2 g/dL (ref 3.5–5.2)
BILIRUBIN TOTAL: 0.5 mg/dL (ref 0.3–1.2)
BUN: 20 mg/dL (ref 6–23)
CO2: 22 meq/L (ref 19–32)
Calcium: 10.5 mg/dL (ref 8.4–10.5)
Chloride: 102 mEq/L (ref 96–112)
Creatinine, Ser: 1.01 mg/dL (ref 0.50–1.10)
GFR calc non Af Amer: 59 mL/min — ABNORMAL LOW (ref >90)
GFR, EST AFRICAN AMERICAN: 69 mL/min — AB (ref >90)
GLUCOSE: 103 mg/dL — AB (ref 70–99)
POTASSIUM: 3.7 meq/L (ref 3.7–5.3)
Sodium: 140 mEq/L (ref 137–147)
Total Protein: 7.9 g/dL (ref 6.0–8.3)

## 2013-07-03 LAB — CBC WITH DIFFERENTIAL/PLATELET
BASOS ABS: 0 10*3/uL (ref 0.0–0.1)
Basophils Relative: 0 % (ref 0–1)
EOS PCT: 2 % (ref 0–5)
Eosinophils Absolute: 0.2 10*3/uL (ref 0.0–0.7)
HCT: 36.4 % (ref 36.0–46.0)
Hemoglobin: 12.4 g/dL (ref 12.0–15.0)
LYMPHS ABS: 2.8 10*3/uL (ref 0.7–4.0)
LYMPHS PCT: 27 % (ref 12–46)
MCH: 29.6 pg (ref 26.0–34.0)
MCHC: 34.1 g/dL (ref 30.0–36.0)
MCV: 86.9 fL (ref 78.0–100.0)
Monocytes Absolute: 0.6 10*3/uL (ref 0.1–1.0)
Monocytes Relative: 5 % (ref 3–12)
NEUTROS ABS: 6.9 10*3/uL (ref 1.7–7.7)
Neutrophils Relative %: 66 % (ref 43–77)
PLATELETS: 200 10*3/uL (ref 150–400)
RBC: 4.19 MIL/uL (ref 3.87–5.11)
RDW: 14.6 % (ref 11.5–15.5)
WBC: 10.4 10*3/uL (ref 4.0–10.5)

## 2013-07-03 LAB — LIPASE, BLOOD: Lipase: 40 U/L (ref 11–59)

## 2013-07-03 MED ORDER — IOHEXOL 300 MG/ML  SOLN
100.0000 mL | Freq: Once | INTRAMUSCULAR | Status: AC | PRN
Start: 2013-07-03 — End: 2013-07-03
  Administered 2013-07-03: 100 mL via INTRAVENOUS

## 2013-07-03 MED ORDER — ONDANSETRON HCL 4 MG/2ML IJ SOLN
4.0000 mg | Freq: Once | INTRAMUSCULAR | Status: AC
  Administered 2013-07-03: 4 mg via INTRAVENOUS
  Filled 2013-07-03: qty 2

## 2013-07-03 MED ORDER — HYDROMORPHONE HCL PF 1 MG/ML IJ SOLN
1.0000 mg | Freq: Once | INTRAMUSCULAR | Status: AC
  Administered 2013-07-03: 1 mg via INTRAVENOUS
  Filled 2013-07-03: qty 1

## 2013-07-03 NOTE — ED Notes (Signed)
Per pt report: pt from home: Pt c/o fo lower abd pain that began around 16:30 this afternoon.  Pt hx of hernia. Pt reports she stopped having flatus around 16:00. Pt reports abd pain feels like gas pressure.  Pt has her colon removed.  Pt reports she stopped having BMs around 16:00.  Pt a/o x 4.  Skin warm and dry.

## 2013-07-03 NOTE — ED Notes (Signed)
Patient transported to CT 

## 2013-07-03 NOTE — ED Provider Notes (Signed)
CSN: [id number]     Arrival date & time 07/03/13  2201 History   First MD Initiated Contact with Patient 07/03/13 2236     Chief Complaint  Patient presents with  . Abdominal Pain   (Consider location/radiation/quality/duration/timing/severity/associated sxs/prior Treatment) HPI Patient with hx multiple abdominal surgeries and SBOs presents with periumbilical abdominal pain, nausea, inability to pass flatus or have a bowel movement.  Pain began two days ago, pt used massage with some improvement.  Today at 4:30pm she began having more several pain, nausea, and stopped being able to pass flatus.  Pain is constant, worse with palpation.  Pain does not radiate. Abdominal surgeries: colectomy for recurrent diverticulitis, anastomotic leak requiring diversion, ostomy takedown (twice attempted), now with large ventral incisional hernia.  Has had 3 SBOs in the past.  States this feels like prior SBOs.     Past Medical History  Diagnosis Date  . HYPOTHYROIDISM 10/06/2008  . HYPERLIPIDEMIA 10/06/2008  . DEPRESSION 02/23/2010  . ALLERGIC RHINITIS 07/23/2009  . 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 Coumadin-all resolved. no coumadin since 11/09/11  . Pneumonia 11-17-11    1'13 postop pneumonia.   Marland Kitchen HYPERTENSION 10/06/2008    EKG 12/12 EPIC  . 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 Hospital-acquired pneumonia 05/28/2011  . Asthma 11-17-11    no flareups in adult yrs. Albuterol used rarely  . Chronic kidney disease 11-17-11    hx. past multiple kidney stones, prev. lithos  . Arthritis     osteoarthritis  . Anemia     no recently  . NEPHROLITHIASIS 10/06/2008    Qualifier: Diagnosis of  By: Elease Hashimoto MD, Bruce     Past Surgical History  Procedure Laterality Date  . Thyroid surgery  1982    tumor removed  . Cesarean section  1983  .  Rotator cuff repair  11-17-11    left '05  . Kidney stone surgery  2000, 2009, 2010  . Laparoscopic assissted total colectomy w/ j-pouch  03/01/11    for pandiverticulosis/itis  . Ileostomy closure  05/02/2011    ileosomy takedown  . Creation / revision of ileostomy / jejunostomy  05/14/2011    small bowel resection, ileostomy  . Abdominal hysterectomy  1986    fibroids  . Eye surgery      lasik  bilaterally  . Appendectomy    . 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 Ileo loop colostomy closure  01/18/2012    Procedure: LAPAROSCOPIC ILEO LOOP COLOSTOMY CLOSURE;  Surgeon: Odis Hollingshead, MD;  Location: [facility];  Service: General;  Laterality: N/A;  Laparoscopic Ileostomy Closure , lysis of adhesions   . Cystoscopy w/ ureteral stent placement Right 02/09/2013    Procedure: CYSTOSCOPY WITH RETROGRADE PYELOGRAM/URETERAL STENT PLACEMENT;  Surgeon: Fredricka Bonine, MD;  Location: [facility];  Service: Urology;  Laterality: Right;  . Cystoscopy with ureteroscopy and stent placement Right 03/04/2013    Procedure: CYSTOSCOPY WITH URETEROSCOPY, LASER LITHOTRIPSY  AND STENT PLACEMENT;  Surgeon: Fredricka Bonine, MD;  Location: [facility];  Service: Urology;  Laterality: Right;  . Holmium laser application N/A 78/06/4233    Procedure: HOLMIUM LASER APPLICATION;  Surgeon: Fredricka Bonine, MD;  Location: [facility];  Service: Urology;  Laterality: N/A;   Family History  Problem Relation Age of Onset  . Arthritis  Other   . Mental illness Other   . COPD Mother   . Depression Mother   . Deep vein thrombosis Father   . Depression Father    History  Substance Use Topics  . Smoking status: Former Smoker -- 1.00 packs/day for 10 years    Types: Cigarettes    Quit date: 08/02/1980  . Smokeless tobacco: Never Used  . Alcohol Use: No   OB History   Grav Para Term Preterm Abortions TAB SAB Ect Mult Living                 Review of Systems  Constitutional: Negative for fever.   Respiratory: Negative for cough and shortness of breath.   Cardiovascular: Negative for chest pain.  Gastrointestinal: Positive for nausea, abdominal pain and constipation. Negative for vomiting and diarrhea.  Genitourinary: Negative for dysuria, urgency, frequency, vaginal bleeding and vaginal discharge.  All other systems reviewed and are negative.    Allergies  Modafinil  Home Medications   Current Outpatient Rx  Name  Route  Sig  Dispense  Refill  . ferrous fumarate (HEMOCYTE - 106 MG FE) 325 (106 FE) MG TABS tablet   Oral   Take 1 tablet by mouth at bedtime.          Marland Kitchen levothyroxine (SYNTHROID, LEVOTHROID) 100 MCG tablet   Oral   Take 100 mcg by mouth daily before breakfast.         . losartan (COZAAR) 50 MG tablet   Oral   Take 50 mg by mouth daily before breakfast.         . meloxicam (MOBIC) 15 MG tablet   Oral   Take 15 mg by mouth at bedtime.          . montelukast (SINGULAIR) 10 MG tablet   Oral   Take 10 mg by mouth at bedtime.          . Multiple Vitamin (MULTIVITAMIN) tablet   Oral   Take 1 tablet by mouth daily.         . sertraline (ZOLOFT) 100 MG tablet   Oral   Take 150 mg by mouth at bedtime.          Marland Kitchen zolpidem (AMBIEN) 10 MG tablet   Oral   Take 10 mg by mouth at bedtime.         Marland Kitchen albuterol (PROVENTIL HFA;VENTOLIN HFA) 108 (90 BASE) MCG/ACT inhaler   Inhalation   Inhale 2 puffs into the lungs every 4 (four) hours as needed for wheezing.   1 Inhaler   2   . HYDROcodone-acetaminophen (NORCO/VICODIN) 5-325 MG per tablet   Oral   Take 1 tablet by mouth every 6 (six) hours as needed for pain.          BP 143/84  Pulse 102  Temp(Src) 98.2 F (36.8 C) (Oral)  Resp 22  SpO2 100% Physical Exam  Nursing note and vitals reviewed. Constitutional: She appears well-developed and well-nourished. No distress.  HENT:  Head: Normocephalic and atraumatic.  Neck: Neck supple.  Cardiovascular: Normal rate and regular rhythm.    Pulmonary/Chest: Effort normal and breath sounds normal. No respiratory distress. She has no wheezes. She has no rales.  Abdominal: Soft. She exhibits no distension. There is tenderness in the periumbilical area. There is no rebound and no guarding. A hernia is present. Hernia confirmed positive in the ventral area.  Abdominal tenderness, worse over ventral incisional hernia that is easily reducible.   Neurological:  She is alert.  Skin: She is not diaphoretic.    ED Course  Procedures (including critical care time) Labs Review Labs Reviewed  COMPREHENSIVE METABOLIC PANEL - Abnormal; Notable for the following:    Glucose, Bld 103 (*)    GFR calc non Af Amer 59 (*)    GFR calc Af Amer 69 (*)    All other components within normal limits  CBC WITH DIFFERENTIAL  LIPASE, BLOOD  URINALYSIS, ROUTINE W REFLEX MICROSCOPIC   Imaging Review Ct Abdomen Pelvis W Contrast  07/03/2013   CLINICAL DATA:  Abdominal pain.  History of small bowel obstruction.  EXAM: CT ABDOMEN AND PELVIS WITH CONTRAST  TECHNIQUE: Multidetector CT imaging of the abdomen and pelvis was performed using the standard protocol following bolus administration of intravenous contrast.  CONTRAST:  100 mL OMNIPAQUE IOHEXOL 300 MG/ML  SOLN  COMPARISON:  Chest in two views abdomen earlier this same day and CT abdomen and pelvis 03/21/2013.  FINDINGS: The lung bases are clear.  No pleural or pericardial effusion.  The patient has a small bowel obstruction with loops dilated up to 3.5 cm with air-fluid levels present. Again seen is a midline ventral hernia. There is a surgical anastomosis at the hernia and several small bowel loops appear adhesed in this location. Distal small bowel loops are decompressed. Point of obstruction is the same as that seen on the prior examination. The patient is status post colectomy. There is no pneumatosis, portal venous gas or free intraperitoneal air.  A few small hepatic cysts are unchanged. The gallbladder,  adrenal glands, spleen and pancreas appear normal. There is a punctate nonobstructing stone in the midpole of the left kidney. There is also a punctate nonobstructing stone in the midpole of the right kidney. Small bilateral renal cysts are unchanged. Scattered atherosclerotic vascular disease is noted. There is no lymphadenopathy or fluid. No lytic or sclerotic bony lesion is identified.  IMPRESSION: Study is positive for small bowel obstruction likely due to adhesions. Transition point is at a small bowel anastomosis in the midline of the abdomen where several small bowel loops appear adhesed together. Large ventral hernia is again seen as on the prior study but does not cause the obstruction.  Single bilateral nonobstructing renal stones.  Hepatic cysts.   Electronically Signed   By: Inge Rise M.D.   On: 07/03/2013 23:52   Dg Abd Acute W/chest  07/03/2013   CLINICAL DATA:  Abdominal pain, nausea and constipation. History small bowel obstruction.  EXAM: ACUTE ABDOMEN SERIES (ABDOMEN 2 VIEW & CHEST 1 VIEW)  COMPARISON:  Chest in two views abdomen 03/21/2013.  FINDINGS: Single view of the chest demonstrates some coarsening of the pulmonary interstitium but clear lungs. Heart size is normal. No pneumothorax or pleural fluid.  Two views of the abdomen show a dilated loop of small bowel measuring 3.2 cm on the left with air-fluid level present. No colonic gas is identified. No free intraperitoneal air is seen. No abnormal abdominal calcification.  IMPRESSION:  Bowel gas pattern worrisome for early small bowel obstruction. Negative for free intraperitoneal air.  No acute cardiopulmonary disease.   Electronically Signed   By: Inge Rise M.D.   On: 07/03/2013 23:04    EKG Interpretation   None      11:42 PM I spoke with Dr Zella Richer who will see the patient.  He agrees to cancelling CT scan which I attempted to do but patient had just been brought back from scanner when I got  off the phone.     MDM   1. Small bowel obstruction    Pt with hx multiple abdominal surgeries and several SBOs presents with symptoms c/w previous SBO.  SBO confirmed by xray and CT.  Discussed with general surgery Dr Zella Richer, who will see and admit the patient.  Pt currently with pain controlled.  No vomiting.     [place], PA-C 07/04/13 0003

## 2013-07-04 ENCOUNTER — Encounter (HOSPITAL_COMMUNITY): Payer: Self-pay | Admitting: General Practice

## 2013-07-04 ENCOUNTER — Ambulatory Visit: Payer: BC Managed Care – PPO | Admitting: Family Medicine

## 2013-07-04 ENCOUNTER — Inpatient Hospital Stay (HOSPITAL_COMMUNITY): Payer: BC Managed Care – PPO

## 2013-07-04 DIAGNOSIS — K56609 Unspecified intestinal obstruction, unspecified as to partial versus complete obstruction: Secondary | ICD-10-CM

## 2013-07-04 LAB — BASIC METABOLIC PANEL
BUN: 21 mg/dL (ref 6–23)
CALCIUM: 9.8 mg/dL (ref 8.4–10.5)
CO2: 22 mEq/L (ref 19–32)
CREATININE: 1.04 mg/dL (ref 0.50–1.10)
Chloride: 103 mEq/L (ref 96–112)
GFR calc Af Amer: 66 mL/min — ABNORMAL LOW (ref >90)
GFR, EST NON AFRICAN AMERICAN: 57 mL/min — AB (ref >90)
Glucose, Bld: 155 mg/dL — ABNORMAL HIGH (ref 70–99)
Potassium: 4.3 mEq/L (ref 3.7–5.3)
Sodium: 140 mEq/L (ref 137–147)

## 2013-07-04 LAB — URINE MICROSCOPIC-ADD ON

## 2013-07-04 LAB — URINALYSIS, ROUTINE W REFLEX MICROSCOPIC
BILIRUBIN URINE: NEGATIVE
GLUCOSE, UA: NEGATIVE mg/dL
Ketones, ur: NEGATIVE mg/dL
Nitrite: POSITIVE — AB
Protein, ur: NEGATIVE mg/dL
Specific Gravity, Urine: >1.046 — ABNORMAL HIGH (ref 1.005–1.030)
Urobilinogen, UA: 0.2 mg/dL (ref 0.0–1.0)
pH: 5 (ref 5.0–8.0)

## 2013-07-04 LAB — CBC
HEMATOCRIT: 33.1 % — AB (ref 36.0–46.0)
Hemoglobin: 11 g/dL — ABNORMAL LOW (ref 12.0–15.0)
MCH: 29.2 pg (ref 26.0–34.0)
MCHC: 33.2 g/dL (ref 30.0–36.0)
MCV: 87.8 fL (ref 78.0–100.0)
Platelets: 179 10*3/uL (ref 150–400)
RBC: 3.77 MIL/uL — ABNORMAL LOW (ref 3.87–5.11)
RDW: 14.6 % (ref 11.5–15.5)
WBC: 8 10*3/uL (ref 4.0–10.5)

## 2013-07-04 MED ORDER — ALBUTEROL SULFATE (2.5 MG/3ML) 0.083% IN NEBU: 2.5000 mg | INHALATION_SOLUTION | RESPIRATORY_TRACT | Status: DC | PRN

## 2013-07-04 MED ORDER — ALBUTEROL SULFATE HFA 108 (90 BASE) MCG/ACT IN AERS: 2.0000 | INHALATION_SPRAY | RESPIRATORY_TRACT | Status: DC | PRN

## 2013-07-04 MED ORDER — ONDANSETRON HCL 4 MG/2ML IJ SOLN: 4.0000 mg | INTRAMUSCULAR | Status: DC | PRN

## 2013-07-04 MED ORDER — KCL-LACTATED RINGERS-D5W 20 MEQ/L IV SOLN
INTRAVENOUS | Status: DC
  Administered 2013-07-04 – 2013-07-05 (×3): via INTRAVENOUS
  Filled 2013-07-04 (×6): qty 1000

## 2013-07-04 MED ORDER — PANTOPRAZOLE SODIUM 40 MG IV SOLR
40.0000 mg | Freq: Every day | INTRAVENOUS | Status: DC
  Administered 2013-07-04: 40 mg via INTRAVENOUS
  Filled 2013-07-04 (×2): qty 40

## 2013-07-04 MED ORDER — HYDROCODONE-ACETAMINOPHEN 5-325 MG PO TABS: 1.0000 | ORAL_TABLET | ORAL | Status: DC | PRN

## 2013-07-04 MED ORDER — LEVOTHYROXINE SODIUM 100 MCG IV SOLR
50.0000 ug | Freq: Every day | INTRAVENOUS | Status: DC
  Administered 2013-07-04 – 2013-07-05 (×2): 50 ug via INTRAVENOUS
  Filled 2013-07-04 (×2): qty 5

## 2013-07-04 MED ORDER — HYDROMORPHONE HCL PF 1 MG/ML IJ SOLN
0.5000 mg | INTRAMUSCULAR | Status: DC | PRN
  Administered 2013-07-04 (×3): 1 mg via INTRAVENOUS
  Filled 2013-07-04 (×3): qty 1

## 2013-07-04 MED ORDER — ENOXAPARIN SODIUM 40 MG/0.4ML ~~LOC~~ SOLN
40.0000 mg | SUBCUTANEOUS | Status: DC
  Administered 2013-07-04 – 2013-07-05 (×2): 40 mg via SUBCUTANEOUS
  Filled 2013-07-04 (×2): qty 0.4

## 2013-07-04 NOTE — ED Provider Notes (Signed)
Medical screening examination/treatment/procedure(s) were conducted as a shared visit with non-physician practitioner(s) and myself.  I personally evaluated the patient during the encounter.  63yo F, c/o 2d periumbilical abd pain, nausea, decreased BM/flatus, worse today. Describes as "feels like my last SBO."  VSS, afebrile, A&O, resps easy, RRR, periumbilical area tender, neuro non-focal. CT scan with SBO. Admit to general surgery.          Alfonzo Feller, DO 07/04/13 1351

## 2013-07-04 NOTE — H&P (Signed)
[name] is an 63 y.o. female.   Chief Complaint: Abdominal pain and distention HPI: This is a 63 year old female with multiple previous abdominal operations. 2 days ago she had some intermittent cramping pain and distention. This improved and in fact yesterday morning she felt normal. She was passing gas and having liquid bowel movements.  At approximately 4:30 PM yesterday, she began having waves of cramping abdominal pain that progressively worsened. She presented to the emergency department for evaluation. She had radiographic findings consistent with a small bowel obstruction. She had an episode previous to this back in October and her symptoms were the same. That episode resolved nonoperatively. She is here with her husband. She is comfortable after IV analgesic and IV anti-emetic.  Past Medical History  Diagnosis Date  . HYPOTHYROIDISM 10/06/2008  . HYPERLIPIDEMIA 10/06/2008  . DEPRESSION 02/23/2010  . ALLERGIC RHINITIS 07/23/2009  . 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 Coumadin-all resolved. no coumadin since 11/09/11  . Pneumonia 11-17-11    1'13 postop pneumonia.   Marland Kitchen HYPERTENSION 10/06/2008    EKG 12/12 EPIC  . 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 Hospital-acquired pneumonia 05/28/2011  . Asthma 11-17-11    no flareups in adult yrs. Albuterol used rarely  . Chronic kidney disease 11-17-11    hx. past multiple kidney stones, prev. lithos  . Arthritis     osteoarthritis  . Anemia     no recently  . NEPHROLITHIASIS 10/06/2008    Qualifier: Diagnosis of  By: Elease Hashimoto MD, Bruce      Past Surgical History  Procedure Laterality Date  . Thyroid surgery  1982    tumor removed  . Cesarean section  1983  . Rotator cuff repair  11-17-11    left '05  . Kidney stone surgery  2000, 2009, 2010  . Laparoscopic assissted  total colectomy w/ j-pouch  03/01/11    for pandiverticulosis/itis  . Ileostomy closure  05/02/2011    ileosomy takedown  . Creation / revision of ileostomy / jejunostomy  05/14/2011    small bowel resection, ileostomy  . Abdominal hysterectomy  1986    fibroids  . Eye surgery      lasik  bilaterally  . Appendectomy    . 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 Ileo loop colostomy closure  01/18/2012    Procedure: LAPAROSCOPIC ILEO LOOP COLOSTOMY CLOSURE;  Surgeon: Odis Hollingshead, MD;  Location: [facility];  Service: General;  Laterality: N/A;  Laparoscopic Ileostomy Closure , lysis of adhesions   . Cystoscopy w/ ureteral stent placement Right 02/09/2013    Procedure: CYSTOSCOPY WITH RETROGRADE PYELOGRAM/URETERAL STENT PLACEMENT;  Surgeon: Fredricka Bonine, MD;  Location: [facility];  Service: Urology;  Laterality: Right;  . Cystoscopy with ureteroscopy and stent placement Right 03/04/2013    Procedure: CYSTOSCOPY WITH URETEROSCOPY, LASER LITHOTRIPSY  AND STENT PLACEMENT;  Surgeon: Fredricka Bonine, MD;  Location: [facility];  Service: Urology;  Laterality: Right;  . Holmium laser application N/A 48/05/8561    Procedure: HOLMIUM LASER APPLICATION;  Surgeon: Fredricka Bonine, MD;  Location: [facility];  Service: Urology;  Laterality: N/A;    Family History  Problem Relation Age of Onset  . Arthritis Other   . Mental illness Other   . COPD Mother   . Depression Mother   .  Deep vein thrombosis Father   . Depression Father    Social History:  reports that she quit smoking about 32 years ago. Her smoking use included Cigarettes. She has a 10 pack-year smoking history. She has never used smokeless tobacco. She reports that she does not drink alcohol or use illicit drugs.  Allergies:  Allergies  Allergen Reactions  . Modafinil Rash    Under breasts only.     (Not in a hospital admission)  Results for orders placed during the hospital encounter of 07/03/13 (from the past 48  hour(s))  CBC WITH DIFFERENTIAL     Status: None   Collection Time    07/03/13 10:27 PM      Result Value Range   WBC 10.4  4.0 - 10.5 K/uL   RBC 4.19  3.87 - 5.11 MIL/uL   Hemoglobin 12.4  12.0 - 15.0 g/dL   HCT 36.4  36.0 - 46.0 %   MCV 86.9  78.0 - 100.0 fL   MCH 29.6  26.0 - 34.0 pg   MCHC 34.1  30.0 - 36.0 g/dL   RDW 14.6  11.5 - 15.5 %   Platelets 200  150 - 400 K/uL   Neutrophils Relative % 66  43 - 77 %   Neutro Abs 6.9  1.7 - 7.7 K/uL   Lymphocytes Relative 27  12 - 46 %   Lymphs Abs 2.8  0.7 - 4.0 K/uL   Monocytes Relative 5  3 - 12 %   Monocytes Absolute 0.6  0.1 - 1.0 K/uL   Eosinophils Relative 2  0 - 5 %   Eosinophils Absolute 0.2  0.0 - 0.7 K/uL   Basophils Relative 0  0 - 1 %   Basophils Absolute 0.0  0.0 - 0.1 K/uL  COMPREHENSIVE METABOLIC PANEL     Status: Abnormal   Collection Time    07/03/13 10:27 PM      Result Value Range   Sodium 140  137 - 147 mEq/L   Potassium 3.7  3.7 - 5.3 mEq/L   Chloride 102  96 - 112 mEq/L   CO2 22  19 - 32 mEq/L   Glucose, Bld 103 (*) 70 - 99 mg/dL   BUN 20  6 - 23 mg/dL   Creatinine, Ser 1.01  0.50 - 1.10 mg/dL   Calcium 10.5  8.4 - 10.5 mg/dL   Total Protein 7.9  6.0 - 8.3 g/dL   Albumin 4.2  3.5 - 5.2 g/dL   AST 29  0 - 37 U/L   ALT 34  0 - 35 U/L   Alkaline Phosphatase 76  39 - 117 U/L   Total Bilirubin 0.5  0.3 - 1.2 mg/dL   GFR calc non Af Amer 59 (*) >90 mL/min   GFR calc Af Amer 69 (*) >90 mL/min   Comment: (NOTE)     The eGFR has been calculated using the CKD EPI equation.     This calculation has not been validated in all clinical situations.     eGFR's persistently <90 mL/min signify possible Chronic Kidney     Disease.  LIPASE, BLOOD     Status: None   Collection Time    07/03/13 10:27 PM      Result Value Range   Lipase 40  11 - 59 U/L   Ct Abdomen Pelvis W Contrast  07/03/2013   CLINICAL DATA:  Abdominal pain.  History of small bowel obstruction.  EXAM: CT ABDOMEN AND PELVIS  WITH CONTRAST  TECHNIQUE:  Multidetector CT imaging of the abdomen and pelvis was performed using the standard protocol following bolus administration of intravenous contrast.  CONTRAST:  100 mL OMNIPAQUE IOHEXOL 300 MG/ML  SOLN  COMPARISON:  Chest in two views abdomen earlier this same day and CT abdomen and pelvis 03/21/2013.  FINDINGS: The lung bases are clear.  No pleural or pericardial effusion.  The patient has a small bowel obstruction with loops dilated up to 3.5 cm with air-fluid levels present. Again seen is a midline ventral hernia. There is a surgical anastomosis at the hernia and several small bowel loops appear adhesed in this location. Distal small bowel loops are decompressed. Point of obstruction is the same as that seen on the prior examination. The patient is status post colectomy. There is no pneumatosis, portal venous gas or free intraperitoneal air.  A few small hepatic cysts are unchanged. The gallbladder, adrenal glands, spleen and pancreas appear normal. There is a punctate nonobstructing stone in the midpole of the left kidney. There is also a punctate nonobstructing stone in the midpole of the right kidney. Small bilateral renal cysts are unchanged. Scattered atherosclerotic vascular disease is noted. There is no lymphadenopathy or fluid. No lytic or sclerotic bony lesion is identified.  IMPRESSION: Study is positive for small bowel obstruction likely due to adhesions. Transition point is at a small bowel anastomosis in the midline of the abdomen where several small bowel loops appear adhesed together. Large ventral hernia is again seen as on the prior study but does not cause the obstruction.  Single bilateral nonobstructing renal stones.  Hepatic cysts.   Electronically Signed   By: Drusilla Kanner M.D.   On: 07/03/2013 23:52   Dg Abd Acute W/chest  07/03/2013   CLINICAL DATA:  Abdominal pain, nausea and constipation. History small bowel obstruction.  EXAM: ACUTE ABDOMEN SERIES (ABDOMEN 2 VIEW & CHEST 1 VIEW)   COMPARISON:  Chest in two views abdomen 03/21/2013.  FINDINGS: Single view of the chest demonstrates some coarsening of the pulmonary interstitium but clear lungs. Heart size is normal. No pneumothorax or pleural fluid.  Two views of the abdomen show a dilated loop of small bowel measuring 3.2 cm on the left with air-fluid level present. No colonic gas is identified. No free intraperitoneal air is seen. No abnormal abdominal calcification.  IMPRESSION:  Bowel gas pattern worrisome for early small bowel obstruction. Negative for free intraperitoneal air.  No acute cardiopulmonary disease.   Electronically Signed   By: Drusilla Kanner M.D.   On: 07/03/2013 23:04    Review of Systems  Constitutional: Negative for fever and chills.  HENT: Negative for congestion and sore throat.   Respiratory: Negative for shortness of breath.   Cardiovascular: Negative for chest pain.  Gastrointestinal: Positive for nausea, abdominal pain and diarrhea. Negative for heartburn, vomiting and blood in stool.  Genitourinary: Negative for dysuria and hematuria.  Musculoskeletal: Positive for joint pain.    Blood pressure 143/84, pulse 102, temperature 98.2 F (36.8 C), temperature source Oral, resp. rate 22, SpO2 100.00%. Physical Exam  Constitutional: She appears well-developed and well-nourished. No distress.  HENT:  Head: Normocephalic and atraumatic.  Eyes: No scleral icterus.  Neck: Neck supple.  Lower transverse scar  Cardiovascular: Regular rhythm.   Increased rate  Respiratory: Effort normal and breath sounds normal.  GI: Soft. Bowel sounds are normal. She exhibits distension (Mild). There is tenderness (Mild at ventral hernia site).  There is a midline scar present  with an epigastric incisional hernia that is easily reducible. There is a right lower quadrant scar. There is a left upper quadrant scar.  Musculoskeletal: She exhibits no edema.  Neurological: She is alert.  Skin: Skin is warm and dry.   Psychiatric: She has a normal mood and affect. Her behavior is normal.     Assessment/Plan Small bowel obstruction secondary to adhesions. The ventral incisional hernia is easily reducible. She is feeling better with some analgesic and antibiotic medications.  There is no indication for urgent operation at this time.  She has not had any vomiting.  Plan: Admit to the hospital. IV fluid hydration and bowel rest. Repeat x-rays later today. If she begins having more symptoms and distention along with vomiting, will insert NG tube. We'll try to treat this nonoperatively initially.  Anderia Lorenzo J 07/04/2013, 12:33 AM

## 2013-07-04 NOTE — Progress Notes (Signed)
Ileus/bowel obstruction seems to be resolving quickly.  Similar to last time.  Go ahead and start on full liquids.  I would like her to prove to [place] that she can tolerate a few solid meals before she goes home.  Hopefully home as soon as tomorrow.  I think today is too soon.  Adin Hector, M.D., F.A.C.S. Gastrointestinal and Minimally Invasive Surgery [facility] [address] [place], [place] [address] [phone] Main / Paging

## 2013-07-04 NOTE — Care Management Note (Signed)
    Page 1 of 1   07/04/2013     10:28:52 AM   CARE MANAGEMENT NOTE 07/04/2013  Patient:  [name], [name]   Account Number:  [id number]  Date Initiated:  07/04/2013  Documentation initiated by:  Sunday Spillers  Subjective/Objective Assessment:   63 yo female admitted with SBO. PTA lived at home with spouse.     Action/Plan:   Home when stable   Anticipated DC Date:  07/06/2013   Anticipated DC Plan:  [place]  CM consult      Choice offered to / List presented to:             Status of service:  Completed, signed off Medicare Important Message given?  NA - LOS <3 / Initial given by admissions (If response is "NO", the following Medicare IM given date fields will be blank) Date Medicare IM given:   Date Additional Medicare IM given:    Discharge Disposition:  HOME/SELF CARE  Per UR Regulation:  Reviewed for med. necessity/level of care/duration of stay  If discussed at [place] of Stay Meetings, dates discussed:    Comments:

## 2013-07-04 NOTE — Progress Notes (Signed)
Subjective: Pt feels great.  Pain is nearly resolved, no N/V.  Had 4 loose BM's 1 hour after arriving to the floor.  She's up ambulating.  She's glad it seems to be resolving.  Objective: Vital signs in last 24 hours: Temp:  [98.2 F (36.8 C)-99.5 F (37.5 C)] 98.4 F (36.9 C) (02/06 0620) Pulse Rate:  [96-112] 96 (02/06 0620) Resp:  [20-22] 20 (02/06 0620) BP: (114-143)/(64-84) 115/73 mmHg (02/06 0620) SpO2:  [95 %-100 %] 96 % (02/06 0620) Weight:  [185 lb (83.915 kg)] 185 lb (83.915 kg) (02/06 0120) Last BM Date: 07/03/13  Intake/Output from previous day: 02/05 0701 - 02/06 0700 In: 491.7 [I.V.:491.7] Out: 375 [Urine:375] Intake/Output this shift:    PE: Gen:  Alert, NAD, pleasant Abd: Soft, minimal tenderness over ventral hernia, but soft and reducible, ND, +BS, no HSM,  abdominal scars noted   Lab Results:   Recent Labs  07/03/13 2227 07/04/13 0404  WBC 10.4 8.0  HGB 12.4 11.0*  HCT 36.4 33.1*  PLT 200 179   BMET  Recent Labs  07/03/13 2227 07/04/13 0404  NA 140 140  K 3.7 4.3  CL 102 103  CO2 22 22  GLUCOSE 103* 155*  BUN 20 21  CREATININE 1.01 1.04  CALCIUM 10.5 9.8   PT/INR No results found for this basename: LABPROT, INR,  in the last 72 hours CMP     Component Value Date/Time   NA 140 07/04/2013 0404   K 4.3 07/04/2013 0404   CL 103 07/04/2013 0404   CO2 22 07/04/2013 0404   GLUCOSE 155* 07/04/2013 0404   BUN 21 07/04/2013 0404   CREATININE 1.04 07/04/2013 0404   CALCIUM 9.8 07/04/2013 0404   CALCIUM 10.0 03/18/2012 1355   PROT 7.9 07/03/2013 2227   ALBUMIN 4.2 07/03/2013 2227   AST 29 07/03/2013 2227   ALT 34 07/03/2013 2227   ALKPHOS 76 07/03/2013 2227   BILITOT 0.5 07/03/2013 2227   GFRNONAA 57* 07/04/2013 0404   GFRAA 66* 07/04/2013 0404   Lipase     Component Value Date/Time   LIPASE 40 07/03/2013 2227       Studies/Results: Ct Abdomen Pelvis W Contrast  07/03/2013   CLINICAL DATA:  Abdominal pain.  History of small bowel obstruction.  EXAM: CT  ABDOMEN AND PELVIS WITH CONTRAST  TECHNIQUE: Multidetector CT imaging of the abdomen and pelvis was performed using the standard protocol following bolus administration of intravenous contrast.  CONTRAST:  100 mL OMNIPAQUE IOHEXOL 300 MG/ML  SOLN  COMPARISON:  Chest in two views abdomen earlier this same day and CT abdomen and pelvis 03/21/2013.  FINDINGS: The lung bases are clear.  No pleural or pericardial effusion.  The patient has a small bowel obstruction with loops dilated up to 3.5 cm with air-fluid levels present. Again seen is a midline ventral hernia. There is a surgical anastomosis at the hernia and several small bowel loops appear adhesed in this location. Distal small bowel loops are decompressed. Point of obstruction is the same as that seen on the prior examination. The patient is status post colectomy. There is no pneumatosis, portal venous gas or free intraperitoneal air.  A few small hepatic cysts are unchanged. The gallbladder, adrenal glands, spleen and pancreas appear normal. There is a punctate nonobstructing stone in the midpole of the left kidney. There is also a punctate nonobstructing stone in the midpole of the right kidney. Small bilateral renal cysts are unchanged. Scattered atherosclerotic vascular disease is noted.  There is no lymphadenopathy or fluid. No lytic or sclerotic bony lesion is identified.  IMPRESSION: Study is positive for small bowel obstruction likely due to adhesions. Transition point is at a small bowel anastomosis in the midline of the abdomen where several small bowel loops appear adhesed together. Large ventral hernia is again seen as on the prior study but does not cause the obstruction.  Single bilateral nonobstructing renal stones.  Hepatic cysts.   Electronically Signed   By: Inge Rise M.D.   On: 07/03/2013 23:52   Dg Abd Acute W/chest  07/03/2013   CLINICAL DATA:  Abdominal pain, nausea and constipation. History small bowel obstruction.  EXAM: ACUTE  ABDOMEN SERIES (ABDOMEN 2 VIEW & CHEST 1 VIEW)  COMPARISON:  Chest in two views abdomen 03/21/2013.  FINDINGS: Single view of the chest demonstrates some coarsening of the pulmonary interstitium but clear lungs. Heart size is normal. No pneumothorax or pleural fluid.  Two views of the abdomen show a dilated loop of small bowel measuring 3.2 cm on the left with air-fluid level present. No colonic gas is identified. No free intraperitoneal air is seen. No abnormal abdominal calcification.  IMPRESSION:  Bowel gas pattern worrisome for early small bowel obstruction. Negative for free intraperitoneal air.  No acute cardiopulmonary disease.   Electronically Signed   By: Inge Rise M.D.   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  Seems to have nearly resolved her obstructive symptoms.  Had 4 BM's about 1 hour after arriving to the floor.  Continue with conservative management for now (IVF, pain control, antiemetics), okay to start clears and fulls at lunch 2.  IF nausea or vomiting occurs will need NG tube placed  3.  Pending repeat KUB this am 4.  Ambulate and IS 5.  SCD's and lovenox 6.  Plan to d/c in am if all goes well once tolerating soft food    LOS: 1 day    DORT, Treveon Bourcier 07/04/2013, 9:04 AM Pager: [phone]

## 2013-07-05 NOTE — Discharge Summary (Signed)
   Patient ID: [name] [id number] y.o. 12-13-1950  07/03/2013  Discharge date and time: 07/05/2013   Admitting Physician: Zella Richer  Discharge Physician: Excell Seltzer T  Admission Diagnoses: Small bowel obstruction [560.9]  Discharge Diagnoses: same  Operations: none  Admission Condition: fair  Discharged Condition: good  Indication for Admission: patient is a 63 year old female with a history of multiple previous abdominal operations status post total colectomy, ileostomy and ileostomy takedown with known ventral hernia and previous small bowel obstruction. She presented with abdominal pain and vomiting and CT scan showing evidence of small bowel obstruction although no incarceration of her ventral hernia.    Hospital Course: She was admitted and made n.p.o. And almost immediately had improvement of her symptoms and began having frequent loose bowel movements. Followup abdominal x-ray the following day showed resolution of small bowel distention. She was started on a soft diet which she tolerated well. On the day of discharge she denies any abdominal pain or nausea. Abdomen is soft and nontender and nondistended.   Disposition: Home  Patient Instructions:    Medication List         albuterol 108 (90 BASE) MCG/ACT inhaler  Commonly known as:  PROVENTIL HFA;VENTOLIN HFA  Inhale 2 puffs into the lungs every 4 (four) hours as needed for wheezing.     ferrous fumarate 325 (106 FE) MG Tabs tablet  Commonly known as:  HEMOCYTE - 106 mg FE  Take 1 tablet by mouth at bedtime.     HYDROcodone-acetaminophen 5-325 MG per tablet  Commonly known as:  NORCO/VICODIN  Take 1 tablet by mouth every 6 (six) hours as needed for pain.     levothyroxine 100 MCG tablet  Commonly known as:  SYNTHROID, LEVOTHROID  Take 100 mcg by mouth daily before breakfast.     losartan 50 MG tablet  Commonly known as:  COZAAR  Take 50 mg by mouth daily before breakfast.     meloxicam  15 MG tablet  Commonly known as:  MOBIC  Take 15 mg by mouth at bedtime.     multivitamin tablet  Take 1 tablet by mouth daily.     sertraline 100 MG tablet  Commonly known as:  ZOLOFT  Take 150 mg by mouth at bedtime.     SINGULAIR 10 MG tablet  Generic drug:  montelukast  Take 10 mg by mouth at bedtime.     zolpidem 10 MG tablet  Commonly known as:  AMBIEN  Take 10 mg by mouth at bedtime.        Activity: activity as tolerated Diet: soft diet for 3 or 4 days and gradually advanced to regular Wound Care: none needed  Follow-up:  With Dr. Zella Richer as needed  Signed: Edward Jolly MD, FACS  07/05/2013, 11:07 AM

## 2013-07-05 NOTE — Discharge Instructions (Signed)
Soft diet only for several days and gradually resume normal foods. No activity limitations. Call [facility] as needed for increased abdominal pain, vomiting, distention or any other concerns.

## 2013-07-05 NOTE — Progress Notes (Signed)
Assessment unchanged. Pt and husband verbalized understanding of dc instructions through teach back. Pt has My Chart account. No scripts at dc. Discharged via wc to front entrance to meet awaiting vehicle and husband to carry home. Accompanied by nurse.

## 2013-07-06 LAB — URINE CULTURE

## 2013-07-11 ENCOUNTER — Other Ambulatory Visit: Payer: Self-pay | Admitting: Family Medicine

## 2013-07-14 ENCOUNTER — Other Ambulatory Visit: Payer: Self-pay | Admitting: Family Medicine

## 2013-07-28 ENCOUNTER — Other Ambulatory Visit: Payer: Self-pay

## 2013-07-28 DIAGNOSIS — Z1231 Encounter for screening mammogram for malignant neoplasm of breast: Secondary | ICD-10-CM

## 2013-08-06 ENCOUNTER — Other Ambulatory Visit: Payer: Self-pay | Admitting: Family Medicine

## 2013-08-13 ENCOUNTER — Other Ambulatory Visit (INDEPENDENT_AMBULATORY_CARE_PROVIDER_SITE_OTHER): Payer: Medicare Other

## 2013-08-13 DIAGNOSIS — E039 Hypothyroidism, unspecified: Secondary | ICD-10-CM

## 2013-08-13 DIAGNOSIS — Z0000 Encounter for general adult medical examination without abnormal findings: Secondary | ICD-10-CM

## 2013-08-13 DIAGNOSIS — D649 Anemia, unspecified: Secondary | ICD-10-CM

## 2013-08-13 DIAGNOSIS — I10 Essential (primary) hypertension: Secondary | ICD-10-CM

## 2013-08-13 DIAGNOSIS — E785 Hyperlipidemia, unspecified: Secondary | ICD-10-CM

## 2013-08-13 LAB — POCT URINALYSIS DIPSTICK
Bilirubin, UA: NEGATIVE
Glucose, UA: NEGATIVE
Ketones, UA: NEGATIVE
NITRITE UA: NEGATIVE
PH UA: 5.5
PROTEIN UA: NEGATIVE
Spec Grav, UA: >=1.03
Urobilinogen, UA: 0.2

## 2013-08-13 LAB — TSH: TSH: 1.09 u[IU]/mL (ref 0.35–5.50)

## 2013-08-13 LAB — HEPATIC FUNCTION PANEL
ALT: 31 U/L (ref 0–35)
AST: 24 U/L (ref 0–37)
Albumin: 4.1 g/dL (ref 3.5–5.2)
Alkaline Phosphatase: 63 U/L (ref 39–117)
BILIRUBIN DIRECT: 0 mg/dL (ref 0.0–0.3)
BILIRUBIN TOTAL: 0.5 mg/dL (ref 0.3–1.2)
Total Protein: 7.2 g/dL (ref 6.0–8.3)

## 2013-08-13 LAB — CBC WITH DIFFERENTIAL/PLATELET
Basophils Absolute: 0 10*3/uL (ref 0.0–0.1)
Basophils Relative: 0.5 % (ref 0.0–3.0)
Eosinophils Absolute: 0.2 10*3/uL (ref 0.0–0.7)
Eosinophils Relative: 2.9 % (ref 0.0–5.0)
HCT: 34.2 % — ABNORMAL LOW (ref 36.0–46.0)
Hemoglobin: 11.5 g/dL — ABNORMAL LOW (ref 12.0–15.0)
Lymphocytes Relative: 36.6 % (ref 12.0–46.0)
Lymphs Abs: 2.5 10*3/uL (ref 0.7–4.0)
MCHC: 33.6 g/dL (ref 30.0–36.0)
MCV: 89.2 fl (ref 78.0–100.0)
Monocytes Absolute: 0.4 10*3/uL (ref 0.1–1.0)
Monocytes Relative: 6.6 % (ref 3.0–12.0)
NEUTROS PCT: 53.4 % (ref 43.0–77.0)
Neutro Abs: 3.6 10*3/uL (ref 1.4–7.7)
PLATELETS: 207 10*3/uL (ref 150.0–400.0)
RBC: 3.84 Mil/uL — ABNORMAL LOW (ref 3.87–5.11)
RDW: 15.2 % — ABNORMAL HIGH (ref 11.5–14.6)
WBC: 6.7 10*3/uL (ref 4.5–10.5)

## 2013-08-13 LAB — LIPID PANEL
CHOLESTEROL: 240 mg/dL — AB (ref 0–200)
HDL: 57.2 mg/dL (ref >39.00)
LDL Cholesterol: 132 mg/dL — ABNORMAL HIGH (ref 0–99)
Total CHOL/HDL Ratio: 4
Triglycerides: 255 mg/dL — ABNORMAL HIGH (ref 0.0–149.0)
VLDL: 51 mg/dL — AB (ref 0.0–40.0)

## 2013-08-13 LAB — BASIC METABOLIC PANEL
BUN: 24 mg/dL — AB (ref 6–23)
CALCIUM: 9.8 mg/dL (ref 8.4–10.5)
CHLORIDE: 107 meq/L (ref 96–112)
CO2: 25 mEq/L (ref 19–32)
CREATININE: 1 mg/dL (ref 0.4–1.2)
GFR: 60 mL/min — ABNORMAL LOW (ref >60.00)
Glucose, Bld: 103 mg/dL — ABNORMAL HIGH (ref 70–99)
Potassium: 3.8 mEq/L (ref 3.5–5.1)
Sodium: 139 mEq/L (ref 135–145)

## 2013-08-20 ENCOUNTER — Ambulatory Visit (INDEPENDENT_AMBULATORY_CARE_PROVIDER_SITE_OTHER): Payer: Medicare Other | Admitting: Family Medicine

## 2013-08-20 ENCOUNTER — Encounter: Payer: Self-pay | Admitting: Family Medicine

## 2013-08-20 VITALS — BP 128/80 | HR 88 | Temp 98.4°F | Ht 68.0 in | Wt 196.0 lb

## 2013-08-20 DIAGNOSIS — E039 Hypothyroidism, unspecified: Secondary | ICD-10-CM

## 2013-08-20 DIAGNOSIS — Z0000 Encounter for general adult medical examination without abnormal findings: Secondary | ICD-10-CM

## 2013-08-20 NOTE — Progress Notes (Signed)
Pre visit review using our clinic review tool, if applicable. No additional management support is needed unless otherwise documented below in the visit note. 

## 2013-08-20 NOTE — Patient Instructions (Signed)
Hypertriglyceridemia  Diet for High blood levels of Triglycerides Most fats in food are triglycerides. Triglycerides in your blood are stored as fat in your body. High levels of triglycerides in your blood may put you at a greater risk for heart disease and stroke.  Normal triglyceride levels are less than 150 mg/dL. Borderline high levels are 150-199 mg/dl. High levels are 200 - 499 mg/dL, and very high triglyceride levels are greater than 500 mg/dL. The decision to treat high triglycerides is generally based on the level. For people with borderline or high triglyceride levels, treatment includes weight loss and exercise. Drugs are recommended for people with very high triglyceride levels. Many people who need treatment for high triglyceride levels have metabolic syndrome. This syndrome is a collection of disorders that often include: insulin resistance, high blood pressure, blood clotting problems, high cholesterol and triglycerides. TESTING PROCEDURE FOR TRIGLYCERIDES  You should not eat 4 hours before getting your triglycerides measured. The normal range of triglycerides is between 10 and 250 milligrams per deciliter (mg/dl). Some people may have extreme levels (1000 or above), but your triglyceride level may be too high if it is above 150 mg/dl, depending on what other risk factors you have for heart disease.  People with high blood triglycerides may also have high blood cholesterol levels. If you have high blood cholesterol as well as high blood triglycerides, your risk for heart disease is probably greater than if you only had high triglycerides. High blood cholesterol is one of the main risk factors for heart disease. CHANGING YOUR DIET  Your weight can affect your blood triglyceride level. If you are more than 20% above your ideal body weight, you may be able to lower your blood triglycerides by losing weight. Eating less and exercising regularly is the best way to combat this. Fat provides more  calories than any other food. The best way to lose weight is to eat less fat. Only 30% of your total calories should come from fat. Less than 7% of your diet should come from saturated fat. A diet low in fat and saturated fat is the same as a diet to decrease blood cholesterol. By eating a diet lower in fat, you may lose weight, lower your blood cholesterol, and lower your blood triglyceride level.  Eating a diet low in fat, especially saturated fat, may also help you lower your blood triglyceride level. Ask your dietitian to help you figure how much fat you can eat based on the number of calories your caregiver has prescribed for you.  Exercise, in addition to helping with weight loss may also help lower triglyceride levels.   Alcohol can increase blood triglycerides. You may need to stop drinking alcoholic beverages.  Too much carbohydrate in your diet may also increase your blood triglycerides. Some complex carbohydrates are necessary in your diet. These may include bread, rice, potatoes, other starchy vegetables and cereals.  Reduce "simple" carbohydrates. These may include pure sugars, candy, honey, and jelly without losing other nutrients. If you have the kind of high blood triglycerides that is affected by the amount of carbohydrates in your diet, you will need to eat less sugar and less high-sugar foods. Your caregiver can help you with this.  Adding 2-4 grams of fish oil (EPA+ DHA) may also help lower triglycerides. Speak with your caregiver before adding any supplements to your regimen. Following the Diet  Maintain your ideal weight. Your caregivers can help you with a diet. Generally, eating less food and getting more   exercise will help you lose weight. Joining a weight control group may also help. Ask your caregivers for a good weight control group in your area.  Eat low-fat foods instead of high-fat foods. This can help you lose weight too.  These foods are lower in fat. Eat MORE of these:    Dried beans, peas, and lentils.  Egg whites.  Low-fat cottage cheese.  Fish.  Lean cuts of meat, such as round, sirloin, rump, and flank (cut extra fat off meat you fix).  Whole grain breads, cereals and pasta.  Skim and nonfat dry milk.  Low-fat yogurt.  Poultry without the skin.  Cheese made with skim or part-skim milk, such as mozzarella, parmesan, farmers', ricotta, or pot cheese. These are higher fat foods. Eat LESS of these:   Whole milk and foods made from whole milk, such as American, blue, cheddar, monterey jack, and swiss cheese  High-fat meats, such as luncheon meats, sausages, knockwurst, bratwurst, hot dogs, ribs, corned beef, ground pork, and regular ground beef.  Fried foods. Limit saturated fats in your diet. Substituting unsaturated fat for saturated fat may decrease your blood triglyceride level. You will need to read package labels to know which products contain saturated fats.  These foods are high in saturated fat. Eat LESS of these:   Fried pork skins.  Whole milk.  Skin and fat from poultry.  Palm oil.  Butter.  Shortening.  Cream cheese.  Bacon.  Margarines and baked goods made from listed oils.  Vegetable shortenings.  Chitterlings.  Fat from meats.  Coconut oil.  Palm kernel oil.  Lard.  Cream.  Sour cream.  Fatback.  Coffee whiteners and non-dairy creamers made with these oils.  Cheese made from whole milk. Use unsaturated fats (both polyunsaturated and monounsaturated) moderately. Remember, even though unsaturated fats are better than saturated fats; you still want a diet low in total fat.  These foods are high in unsaturated fat:   Canola oil.  Sunflower oil.  Mayonnaise.  Almonds.  Peanuts.  Pine nuts.  Margarines made with these oils.  Safflower oil.  Olive oil.  Avocados.  Cashews.  Peanut butter.  Sunflower seeds.  Soybean oil.  Peanut  oil.  Olives.  Pecans.  Walnuts.  Pumpkin seeds. Avoid sugar and other high-sugar foods. This will decrease carbohydrates without decreasing other nutrients. Sugar in your food goes rapidly to your blood. When there is excess sugar in your blood, your liver may use it to make more triglycerides. Sugar also contains calories without other important nutrients.  Eat LESS of these:   Sugar, brown sugar, powdered sugar, jam, jelly, preserves, honey, syrup, molasses, pies, candy, cakes, cookies, frosting, pastries, colas, soft drinks, punches, fruit drinks, and regular gelatin.  Avoid alcohol. Alcohol, even more than sugar, may increase blood triglycerides. In addition, alcohol is high in calories and low in nutrients. Ask for sparkling water, or a diet soft drink instead of an alcoholic beverage. Suggestions for planning and preparing meals   Bake, broil, grill or roast meats instead of frying.  Remove fat from meats and skin from poultry before cooking.  Add spices, herbs, lemon juice or vinegar to vegetables instead of salt, rich sauces or gravies.  Use a non-stick skillet without fat or use no-stick sprays.  Cool and refrigerate stews and broth. Then remove the hardened fat floating on the surface before serving.  Refrigerate meat drippings and skim off fat to make low-fat gravies.  Serve more fish.  Use less butter,   margarine and other high-fat spreads on bread or vegetables.  Use skim or reconstituted non-fat dry milk for cooking.  Cook with low-fat cheeses.  Substitute low-fat yogurt or cottage cheese for all or part of the sour cream in recipes for sauces, dips or congealed salads.  Use half yogurt/half mayonnaise in salad recipes.  Substitute evaporated skim milk for cream. Evaporated skim milk or reconstituted non-fat dry milk can be whipped and substituted for whipped cream in certain recipes.  Choose fresh fruits for dessert instead of high-fat foods such as pies or  cakes. Fruits are naturally low in fat. When Dining Out   Order low-fat appetizers such as fruit or vegetable juice, pasta with vegetables or tomato sauce.  Select clear, rather than cream soups.  Ask that dressings and gravies be served on the side. Then use less of them.  Order foods that are baked, broiled, poached, steamed, stir-fried, or roasted.  Ask for margarine instead of butter, and use only a small amount.  Drink sparkling water, unsweetened tea or coffee, or diet soft drinks instead of alcohol or other sweet beverages. QUESTIONS AND ANSWERS ABOUT OTHER FATS IN THE BLOOD: SATURATED FAT, TRANS FAT, AND CHOLESTEROL What is trans fat? Trans fat is a type of fat that is formed when vegetable oil is hardened through a process called hydrogenation. This process helps makes foods more solid, gives them shape, and prolongs their shelf life. Trans fats are also called hydrogenated or partially hydrogenated oils.  What do saturated fat, trans fat, and cholesterol in foods have to do with heart disease? Saturated fat, trans fat, and cholesterol in the diet all raise the level of LDL "bad" cholesterol in the blood. The higher the LDL cholesterol, the greater the risk for coronary heart disease (CHD). Saturated fat and trans fat raise LDL similarly.  What foods contain saturated fat, trans fat, and cholesterol? High amounts of saturated fat are found in animal products, such as fatty cuts of meat, chicken skin, and full-fat dairy products like butter, whole milk, cream, and cheese, and in tropical vegetable oils such as palm, palm kernel, and coconut oil. Trans fat is found in some of the same foods as saturated fat, such as vegetable shortening, some margarines (especially hard or stick margarine), crackers, cookies, baked goods, fried foods, salad dressings, and other processed foods made with partially hydrogenated vegetable oils. Small amounts of trans fat also occur naturally in some animal  products, such as milk products, beef, and lamb. Foods high in cholesterol include liver, other organ meats, egg yolks, shrimp, and full-fat dairy products. How can I use the new food label to make heart-healthy food choices? Check the Nutrition Facts panel of the food label. Choose foods lower in saturated fat, trans fat, and cholesterol. For saturated fat and cholesterol, you can also use the Percent Daily Value (%DV): 5% DV or less is low, and 20% DV or more is high. (There is no %DV for trans fat.) Use the Nutrition Facts panel to choose foods low in saturated fat and cholesterol, and if the trans fat is not listed, read the ingredients and limit products that list shortening or hydrogenated or partially hydrogenated vegetable oil, which tend to be high in trans fat. POINTS TO REMEMBER:   Discuss your risk for heart disease with your caregivers, and take steps to reduce risk factors.  Change your diet. Choose foods that are low in saturated fat, trans fat, and cholesterol.  Add exercise to your daily routine if   it is not already being done. Participate in physical activity of moderate intensity, like brisk walking, for at least 30 minutes on most, and preferably all days of the week. No time? Break the 30 minutes into three, 10-minute segments during the day.  Stop smoking. If you do smoke, contact your caregiver to discuss ways in which they can help you quit.  Do not use street drugs.  Maintain a normal weight.  Maintain a healthy blood pressure.  Keep up with your blood work for checking the fats in your blood as directed by your caregiver. Document Released: 03/02/2004 Document Revised: 11/14/2011 Document Reviewed: 09/28/2008 [employer] Patient Information 2014 [employer].  

## 2013-08-20 NOTE — Progress Notes (Signed)
Subjective:    Patient ID: [name], female    DOB: 10-Feb-1951, 63 y.o.   MRN: [id number]  HPI Patient seen for complete physical. She's had previous hysterectomy. She's had previous colonoscopy after multiple complications from recurrent diverticulitis. She is doing well at this time. She has hypertension treated with losartan. Other medical problems include history of dyslipidemia, recurrent kidney stones, history of depression, hypothyroidism, osteoarthritis.  Current medications reviewed and compliant with all. Her immunizations are up-to-date. She would not feet need any further colonoscopies with history of colectomy. She is scheduled for repeat mammogram. She's had previous hysterectomy so no further Pap smears.  Past Medical History  Diagnosis Date  . HYPOTHYROIDISM 10/06/2008  . HYPERLIPIDEMIA 10/06/2008  . DEPRESSION 02/23/2010  . ALLERGIC RHINITIS 07/23/2009  . 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 Coumadin-all resolved. no coumadin since 11/09/11  . Pneumonia 11-17-11    1'13 postop pneumonia.   Marland Kitchen HYPERTENSION 10/06/2008    EKG 12/12 EPIC  . 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 Hospital-acquired pneumonia 05/28/2011  . Asthma 11-17-11    no flareups in adult yrs. Albuterol used rarely  . Chronic kidney disease 11-17-11    hx. past multiple kidney stones, prev. lithos  . Arthritis     osteoarthritis  . Anemia     no recently  . NEPHROLITHIASIS 10/06/2008    Qualifier: Diagnosis of  By: Elease Hashimoto MD, Bruce     Past Surgical History  Procedure Laterality Date  . Thyroid surgery  1982    tumor removed  . Cesarean section  1983  . Rotator cuff repair  11-17-11    left '05  . Kidney stone surgery  2000, 2009, 2010  . Laparoscopic assissted total colectomy w/ j-pouch  03/01/11    for pandiverticulosis/itis   . Ileostomy closure  05/02/2011    ileosomy takedown  . Creation / revision of ileostomy / jejunostomy  05/14/2011    small bowel resection, ileostomy  . Abdominal hysterectomy  1986    fibroids  . Eye surgery      lasik  bilaterally  . Appendectomy    . 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 Ileo loop colostomy closure  01/18/2012    Procedure: LAPAROSCOPIC ILEO LOOP COLOSTOMY CLOSURE;  Surgeon: Odis Hollingshead, MD;  Location: [facility];  Service: General;  Laterality: N/A;  Laparoscopic Ileostomy Closure , lysis of adhesions   . Cystoscopy w/ ureteral stent placement Right 02/09/2013    Procedure: CYSTOSCOPY WITH RETROGRADE PYELOGRAM/URETERAL STENT PLACEMENT;  Surgeon: Fredricka Bonine, MD;  Location: [facility];  Service: Urology;  Laterality: Right;  . Cystoscopy with ureteroscopy and stent placement Right 03/04/2013    Procedure: CYSTOSCOPY WITH URETEROSCOPY, LASER LITHOTRIPSY  AND STENT PLACEMENT;  Surgeon: Fredricka Bonine, MD;  Location: [facility];  Service: Urology;  Laterality: Right;  . Holmium laser application N/A 60/10/3014    Procedure: HOLMIUM LASER APPLICATION;  Surgeon: Fredricka Bonine, MD;  Location: [facility];  Service: Urology;  Laterality: N/A;    reports that she quit smoking about 33 years ago. Her smoking use included Cigarettes. She has a 10 pack-year smoking history. She has never used smokeless tobacco. She reports that she does not drink alcohol or use illicit drugs. family history includes Arthritis in her other; COPD in her  mother; Deep vein thrombosis in her father; Depression in her father, mother, and sister; Mental illness in her other. Allergies  Allergen Reactions  . Modafinil Rash    Under breasts only.      Review of Systems  Constitutional: Positive for fatigue. Negative for fever, activity change, appetite change and unexpected weight change.  HENT: Negative for ear pain, hearing loss, sore throat and trouble swallowing.   Eyes:  Negative for visual disturbance.  Respiratory: Negative for cough and shortness of breath.   Cardiovascular: Negative for chest pain and palpitations.  Gastrointestinal: Negative for abdominal pain, diarrhea, constipation and blood in stool.  Endocrine: Negative for polydipsia and polyuria.  Genitourinary: Negative for dysuria and hematuria.  Musculoskeletal: Positive for arthralgias. Negative for back pain and myalgias.  Skin: Negative for rash.  Neurological: Positive for headaches. Negative for dizziness and syncope.  Hematological: Negative for adenopathy.  Psychiatric/Behavioral: Negative for confusion and dysphoric mood.       Objective:   Physical Exam  Constitutional: She is oriented to person, place, and time. She appears well-developed and well-nourished.  HENT:  Right Ear: External ear normal.  Left Ear: External ear normal.  Mouth/Throat: Oropharynx is clear and moist.  Eyes: Pupils are equal, round, and reactive to light.  Neck: Neck supple. No thyromegaly present.  Cardiovascular: Normal rate and regular rhythm.   Abdominal: Soft. Bowel sounds are normal. She exhibits no distension. There is no tenderness.  Patient has abdominal wall hernia which is soft and nontender.  Musculoskeletal: She exhibits no edema.  Lymphadenopathy:    She has no cervical adenopathy.  Neurological: She is alert and oriented to person, place, and time. No cranial nerve deficit.  Psychiatric: She has a normal mood and affect. Her behavior is normal. Judgment and thought content normal.          Assessment & Plan:  Complete physical. Labs reviewed. Handout on hypertriglyceridemia given. Establish more consistent exercise. Work on weight loss. Immunizations up-to-date. She has scheduled mammogram. No Pap smears with previous hysterectomy. No further colonoscopies with history of total colectomy

## 2013-08-28 ENCOUNTER — Ambulatory Visit
Admission: RE | Admit: 2013-08-28 | Discharge: 2013-08-28 | Disposition: A | Discharge status: Home / Self Care | Payer: Medicare Other | Source: Ambulatory Visit

## 2013-08-28 DIAGNOSIS — Z1231 Encounter for screening mammogram for malignant neoplasm of breast: Secondary | ICD-10-CM

## 2013-09-08 ENCOUNTER — Other Ambulatory Visit: Payer: Self-pay | Admitting: Family Medicine

## 2013-11-20 ENCOUNTER — Other Ambulatory Visit: Payer: Medicare Other

## 2014-01-01 ENCOUNTER — Other Ambulatory Visit: Payer: Self-pay | Admitting: Family Medicine

## 2014-01-11 ENCOUNTER — Other Ambulatory Visit: Payer: Self-pay | Admitting: Family Medicine

## 2014-01-13 ENCOUNTER — Other Ambulatory Visit: Payer: Self-pay | Admitting: Family Medicine

## 2014-03-13 ENCOUNTER — Other Ambulatory Visit: Payer: Self-pay

## 2014-05-27 ENCOUNTER — Other Ambulatory Visit: Payer: Self-pay | Admitting: Family Medicine

## 2014-06-11 ENCOUNTER — Encounter (HOSPITAL_COMMUNITY): Payer: Self-pay | Admitting: Urology

## 2014-07-28 ENCOUNTER — Other Ambulatory Visit: Payer: Self-pay

## 2014-07-28 DIAGNOSIS — Z1231 Encounter for screening mammogram for malignant neoplasm of breast: Secondary | ICD-10-CM

## 2014-08-31 ENCOUNTER — Ambulatory Visit
Admission: RE | Admit: 2014-08-31 | Discharge: 2014-08-31 | Disposition: A | Discharge status: Home / Self Care | Payer: Medicare Other | Source: Ambulatory Visit

## 2014-08-31 DIAGNOSIS — Z1231 Encounter for screening mammogram for malignant neoplasm of breast: Secondary | ICD-10-CM

## 2014-09-02 ENCOUNTER — Encounter: Payer: Self-pay | Admitting: Internal Medicine

## 2014-11-10 ENCOUNTER — Other Ambulatory Visit: Payer: Self-pay | Admitting: Family Medicine

## 2014-11-17 ENCOUNTER — Other Ambulatory Visit: Payer: Self-pay | Admitting: Family Medicine

## 2014-11-20 ENCOUNTER — Encounter: Payer: Self-pay | Admitting: Internal Medicine

## 2014-11-23 ENCOUNTER — Other Ambulatory Visit: Payer: Self-pay | Admitting: Family Medicine

## 2014-12-18 ENCOUNTER — Other Ambulatory Visit: Payer: Self-pay | Admitting: Family Medicine

## 2015-01-17 ENCOUNTER — Other Ambulatory Visit: Payer: Self-pay | Admitting: Family Medicine

## 2015-02-17 ENCOUNTER — Encounter: Payer: Self-pay | Admitting: Internal Medicine

## 2015-02-18 ENCOUNTER — Other Ambulatory Visit: Payer: Self-pay | Admitting: Family Medicine

## 2015-02-19 ENCOUNTER — Other Ambulatory Visit: Payer: Self-pay | Admitting: Family Medicine

## 2015-02-21 ENCOUNTER — Other Ambulatory Visit: Payer: Self-pay | Admitting: Family Medicine

## 2015-03-21 ENCOUNTER — Other Ambulatory Visit: Payer: Self-pay | Admitting: Family Medicine

## 2015-08-04 ENCOUNTER — Other Ambulatory Visit: Payer: Self-pay

## 2015-08-04 DIAGNOSIS — Z1231 Encounter for screening mammogram for malignant neoplasm of breast: Secondary | ICD-10-CM

## 2015-09-02 ENCOUNTER — Ambulatory Visit: Payer: Medicare Other

## 2015-09-20 ENCOUNTER — Ambulatory Visit
Admission: RE | Admit: 2015-09-20 | Discharge: 2015-09-20 | Disposition: A | Discharge status: Home / Self Care | Payer: Medicare Other | Source: Ambulatory Visit

## 2015-09-20 DIAGNOSIS — Z1231 Encounter for screening mammogram for malignant neoplasm of breast: Secondary | ICD-10-CM

## 2015-09-22 ENCOUNTER — Other Ambulatory Visit: Payer: Self-pay | Admitting: Family Medicine

## 2015-09-22 DIAGNOSIS — R928 Other abnormal and inconclusive findings on diagnostic imaging of breast: Secondary | ICD-10-CM

## 2015-10-26 ENCOUNTER — Ambulatory Visit
Admission: RE | Admit: 2015-10-26 | Discharge: 2015-10-26 | Disposition: A | Discharge status: Home / Self Care | Payer: Medicare Other | Source: Ambulatory Visit | Attending: Family Medicine | Admitting: Family Medicine

## 2015-10-26 DIAGNOSIS — R928 Other abnormal and inconclusive findings on diagnostic imaging of breast: Secondary | ICD-10-CM

## 2016-03-20 ENCOUNTER — Other Ambulatory Visit: Payer: Self-pay | Admitting: Internal Medicine

## 2016-03-20 DIAGNOSIS — Z1231 Encounter for screening mammogram for malignant neoplasm of breast: Secondary | ICD-10-CM

## 2016-03-20 DIAGNOSIS — N63 Unspecified lump in unspecified breast: Secondary | ICD-10-CM

## 2016-04-27 ENCOUNTER — Ambulatory Visit
Admission: RE | Admit: 2016-04-27 | Discharge: 2016-04-27 | Disposition: A | Discharge status: Home / Self Care | Payer: Medicare Other | Source: Ambulatory Visit | Attending: Internal Medicine | Admitting: Internal Medicine

## 2016-04-27 DIAGNOSIS — N63 Unspecified lump in unspecified breast: Secondary | ICD-10-CM

## 2016-12-04 ENCOUNTER — Other Ambulatory Visit: Payer: Self-pay | Admitting: Internal Medicine

## 2016-12-04 DIAGNOSIS — N6310 Unspecified lump in the right breast, unspecified quadrant: Secondary | ICD-10-CM

## 2016-12-18 ENCOUNTER — Inpatient Hospital Stay: Admission: RE | Admit: 2016-12-18 | Payer: Medicare Other | Source: Ambulatory Visit

## 2016-12-22 ENCOUNTER — Other Ambulatory Visit: Payer: Self-pay | Admitting: Internal Medicine

## 2016-12-22 ENCOUNTER — Ambulatory Visit
Admission: RE | Admit: 2016-12-22 | Discharge: 2016-12-22 | Disposition: A | Discharge status: Home / Self Care | Payer: Medicare Other | Source: Ambulatory Visit | Attending: Internal Medicine | Admitting: Internal Medicine

## 2016-12-22 DIAGNOSIS — N6310 Unspecified lump in the right breast, unspecified quadrant: Secondary | ICD-10-CM

## 2016-12-26 ENCOUNTER — Other Ambulatory Visit: Payer: Medicare Other

## 2016-12-27 ENCOUNTER — Ambulatory Visit
Admission: RE | Admit: 2016-12-27 | Discharge: 2016-12-27 | Disposition: A | Discharge status: Home / Self Care | Payer: Medicare Other | Source: Ambulatory Visit | Attending: Internal Medicine | Admitting: Internal Medicine

## 2016-12-27 DIAGNOSIS — N6310 Unspecified lump in the right breast, unspecified quadrant: Secondary | ICD-10-CM

## 2017-01-01 ENCOUNTER — Other Ambulatory Visit: Payer: Self-pay | Admitting: Surgery

## 2017-01-01 ENCOUNTER — Ambulatory Visit: Payer: Self-pay | Admitting: Surgery

## 2017-01-01 DIAGNOSIS — D0511 Intraductal carcinoma in situ of right breast: Secondary | ICD-10-CM

## 2017-01-01 NOTE — H&P (Signed)
History of Present Illness [name]. Misty Roy MD; 01/01/2017 12:11 PM) The patient is a 66 year old female who presents with breast cancer. 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 Last year she had a mammogram that showed a probably benign mass at 9:00 in her right breast. She had six-month follow-up. That area seemed to have resolved but at 10:00 she had a new suspicious finding. She underwent further workup including biopsy. 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 Prognostic markers are pending. The patient presents now for her initial surgical evaluation. She is accompanied by her 2 sisters.  The patient is a long-standing patient of this practice. She has had a total abdominal colectomy for recurrent diverticulitis. She also has a ventral hernia that has been evaluated before by Dr. Zella Richer.   Menarche age 54 First pregnancy age 43 Breast-feed yes 4 months 2 years Family history is negative  CLINICAL DATA: One year follow-up for probably benign right breast mass.  EXAM: 2D DIGITAL DIAGNOSTIC BILATERAL MAMMOGRAM WITH CAD AND ADJUNCT TOMO  ULTRASOUND RIGHT BREAST  COMPARISON: 04/27/2016 and earlier  ACR Breast Density Category b: There are scattered areas of fibroglandular density.  FINDINGS: No suspicious mass, distortion, or microcalcifications are identified. Stable appearance of tattoo-related calcifications identified in the upper inner quadrant of the right breast.  Mammographic images were processed with CAD.  On physical exam, I palpate no abnormality in the lateral portion of the right breast.  Targeted ultrasound is performed, showing interval resolution of the group of microcysts in the 9 o'clock location of the right breast. In the 10 o'clock location of the right breast 3 cm from the nipple, there is a small solid oval mass associated with  posterior acoustic enhancement and internal vascularity. Mass measures 0.5 by 0.5 x 0.4 cm. Evaluation of the right axilla is negative for adenopathy.  IMPRESSION: 1. Solid indeterminate mass in the 10 o'clock location of the right breast warranting tissue diagnosis. 2. Interval resolution of group of microcysts in the 9 o'clock location of the right breast. 3. The left breast is negative.  RECOMMENDATION: Right breast ultrasound-guided core biopsy  I have discussed the findings and recommendations with the patient. Results were also provided in writing at the conclusion of the visit. If applicable, a reminder letter will be sent to the patient regarding the next appointment.  BI-RADS CATEGORY 4: Suspicious.   Electronically Signed By: Nolon Nations M.D. On: 12/22/2016 16:49  CLINICAL DATA: 66 year old female presenting for ultrasound-guided biopsy of a right breast mass.  EXAM: ULTRASOUND GUIDED RIGHT BREAST CORE NEEDLE BIOPSY  COMPARISON: Previous exam(s).  FINDINGS: I met with the patient and we discussed the procedure of ultrasound-guided biopsy, including benefits and alternatives. We discussed the high likelihood of a successful procedure. We discussed the risks of the procedure, including infection, bleeding, tissue injury, clip migration, and inadequate sampling. Informed written consent was given. The usual time-out protocol was performed immediately prior to the procedure.  Lesion quadrant: Upper-outer quadrant  Using sterile technique and 1% Lidocaine as local anesthetic, under direct ultrasound visualization, a 14 gauge spring-loaded device was used to perform biopsy of mass in the right breast at 10 o'clock using an inferior approach. At the conclusion of the procedure a ribbon shaped tissue marker clip was deployed into the biopsy cavity. Follow up 2 view mammogram was performed and dictated separately.  IMPRESSION: Ultrasound guided biopsy  of a mass  in the right breast at 10 o'clock. No apparent complications.  Electronically Signed: By: Ammie Ferrier M.D. On: 12/27/2016 15:14  CLINICAL DATA: Post biopsy mammogram of the right breast for clip placement.  EXAM: DIAGNOSTIC RIGHT MAMMOGRAM POST ULTRASOUND BIOPSY  COMPARISON: Previous exam(s).  FINDINGS: Mammographic images were obtained following ultrasound guided biopsy of breast mass at 10 o'clock. The ribbon shaped biopsy marking clip is appropriately positioned within the intended site of biopsy at 10 o'clock in the right breast.  IMPRESSION: Appropriate positioning of the ribbon shaped biopsy marking clip within the biopsied mass at 10 o'clock in the right breast.  Final Assessment: Post Procedure Mammograms for Marker Placement   Electronically Signed By: Ammie Ferrier M.D. 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 Microscopic Comment Cytokeratin 5/6 is negative in the luminal areas. The carcinoma in situ appears low grade. Prognostic markers will be ordered. Dr. Lyndon Code has reviewed the case. Preliminary results were called to [facility] on 12/28/2016. Vicente Males MD Pathologist, Electronic Signature (Case signed 12/29/2016)   Allergies (Janette Ranson, CMA; 01/01/2017 11:25 AM) No Known Drug Allergies 01/01/2017  Medication History (Janette Ranson, CMA; 01/01/2017 11:28 AM) Levothyroxine Sodium (150MCG Tablet, Oral) Active. FLUoxetine HCl (PMDD) (20MG Capsule, Oral) Active. Diclofenac (75MG Tablet, Oral) Active. Tolterodine Tartrate ER (4MG Capsule ER 24HR, Oral) Active. TraZODone HCl (100MG Tablet, Oral) Active. Montelukast Sodium (10MG Tablet, Oral) Active. Omega 3 (1000MG Capsule, Oral) Active. Zinc (50MG Tablet, Oral) Active. Xyzal (5MG Tablet, Oral) Active. Caltrate 600 + D  (600-125MG-IU Tablet, Oral) Active. Iron (28MG Tablet, Oral) Active.    Vitals (Janette Ranson CMA; 01/01/2017 11:29 AM) 01/01/2017 11:28 AM Weight: 178.4 lb Height: 68in Body Surface Area: 1.95 m Body Mass Index: 27.13 kg/m  Temp.: 61F  Pulse: 78 (Regular)  BP: 110/80 (Sitting, Left Arm, Standard)      Physical Exam Rodman Key K. Martrell Eguia MD; 01/01/2017 12:13 PM)  The physical exam findings are as follows: Note:WDWN in NAD Eyes: Pupils equal, round; sclera anicteric HENT: Oral mucosa moist; good dentition Neck: No masses palpated, no thyromegaly Breasts: symmetric; no nipple retraction or discharge; no axillary lymphadenopathy No dominant masses left breast Right breast - bruising upper outer quadrant; some underlying hematoma Lungs: CTA bilaterally; normal respiratory effort CV: Regular rate and rhythm; no murmurs; extremities well-perfused with no edema Abd: +bowel sounds, soft, non-tender, no palpable organomegaly; large ventral hernia Skin: Warm, dry; no sign of jaundice Psychiatric - alert and oriented x 4; calm mood and affect    Assessment & Plan Rodman Key K. Oretha Weismann MD; 01/01/2017 12:13 PM)  DUCTAL CARCINOMA IN SITU (DCIS) OF RIGHT BREAST (D05.11)  Current Plans Schedule for Surgery - Right radioactive seed localized lumpectomy. The surgical procedure has been discussed with the patient. Potential risks, benefits, alternative treatments, and expected outcomes have been explained. All of the patient's questions at this time have been answered. The likelihood of reaching the patient's treatment goal is good. The patient understand the proposed surgical procedure and wishes to proceed. Note:I spent approximately 45 minutes with the patient and her sisters discussing her disease and the options for treatment. I answered his management of the questions as possible. The patient is scheduled to move to [facility] at the end of this month. She wants  to have her surgery here but will likely seek radiation oncology follow-up in [place].  [name]. Georgette Dover, MD, [facility]  Surgery  General/ Trauma Surgery  01/01/2017 12:14 PM

## 2017-01-03 NOTE — Pre-Procedure Instructions (Signed)
[name]  01/03/2017      [facility] - [place], [place] - [address] [place] [address] Phone: [phone] Fax: [phone]  [facility] [address], [place] [address] Phone: [phone] Fax: [phone]  [facility] [place], [place] - [address] AT [place] [address] [place] [address] Phone: [phone] Fax: [phone]    Your procedure is scheduled on August 15  Report to [facility] at [place].M.  Call this number if you have problems the morning of surgery:  [phone]   Remember:  Do not eat food or drink liquids after midnight.   Take these medicines the morning of surgery with A SIP OF WATER albuterol (PROVENTIL HFA;VENTOLIN HFA), FLUoxetine (PROZAC), levothyroxine (SYNTHROID, LEVOTHROID),   7 days prior to surgery STOP taking any Aspirin, Aleve, Naproxen, Ibuprofen, Motrin, Advil, Goody's, BC's, all herbal medications, fish oil, and all vitamins    Do not wear jewelry, make-up or nail polish.  Do not wear lotions, powders, or perfumes, or deoderant.  Do not shave 48 hours prior to surgery.  Men may shave face and neck.  Do not bring valuables to the hospital.  [facility] is not responsible for any belongings or valuables.  Contacts, dentures or bridgework may not be worn into surgery.  Leave your suitcase in the car.  After surgery it may be brought to your room.  For patients admitted to the hospital, discharge time will be determined by your treatment team.  Patients discharged the day of surgery will not be allowed to drive home.    Special instructions:   [place]- Preparing For Surgery  Before surgery, you can play an important role. Because skin is not sterile, your skin needs to be as free of germs as possible. You can reduce  the number of germs on your skin by washing with CHG (chlorahexidine gluconate) Soap before surgery.  CHG is an antiseptic cleaner which kills germs and bonds with the skin to continue killing germs even after washing.  Please do not use if you have an allergy to CHG or antibacterial soaps. If your skin becomes reddened/irritated stop using the CHG.  Do not shave (including legs and underarms) for at least 48 hours prior to first CHG shower. It is OK to shave your face.  Please follow these instructions carefully.   1. Shower the NIGHT BEFORE SURGERY and the MORNING OF SURGERY with CHG.   2. If you chose to wash your hair, wash your hair first as usual with your normal shampoo.  3. After you shampoo, rinse your hair and body thoroughly to remove the shampoo.  4. Use CHG as you would any other liquid soap. You can apply CHG directly to the skin and wash gently with a scrungie or a clean washcloth.   5. Apply the CHG Soap to your body ONLY FROM THE NECK DOWN.  Do not use on open wounds or open sores. Avoid contact with your eyes, ears, mouth and genitals (private parts). Wash genitals (private parts) with your normal soap.  6. Wash thoroughly, paying special attention to the area where your surgery will be performed.  7. Thoroughly rinse your body with warm water from the neck down.  8. DO NOT shower/wash with your normal soap after using and rinsing off the CHG Soap.  9. Pat yourself dry with a CLEAN TOWEL.   10. Wear CLEAN PAJAMAS   11. Place CLEAN SHEETS on your bed the night of your first shower and DO NOT SLEEP WITH PETS.    Day of Surgery: Do not apply any deodorants/lotions. Please wear clean clothes to the hospital/surgery center.      Please read over the following fact sheets that you were given.

## 2017-01-04 ENCOUNTER — Encounter (HOSPITAL_COMMUNITY): Payer: Self-pay

## 2017-01-04 ENCOUNTER — Encounter (HOSPITAL_COMMUNITY)
Admission: RE | Admit: 2017-01-04 | Discharge: 2017-01-04 | Disposition: A | Discharge status: Home / Self Care | Payer: Medicare Other | Source: Ambulatory Visit | Attending: Surgery | Admitting: Surgery

## 2017-01-04 DIAGNOSIS — Z01812 Encounter for preprocedural laboratory examination: Secondary | ICD-10-CM | POA: Diagnosis not present

## 2017-01-04 DIAGNOSIS — D0511 Intraductal carcinoma in situ of right breast: Secondary | ICD-10-CM | POA: Diagnosis not present

## 2017-01-04 HISTORY — DX: Personal history of urinary calculi: Z87.442

## 2017-01-04 HISTORY — DX: Malignant (primary) neoplasm, unspecified: C80.1

## 2017-01-04 HISTORY — DX: Personal history of other diseases of the digestive system: Z87.19

## 2017-01-04 LAB — BASIC METABOLIC PANEL
ANION GAP: 8 (ref 5–15)
BUN: 13 mg/dL (ref 6–20)
CHLORIDE: 106 mmol/L (ref 101–111)
CO2: 28 mmol/L (ref 22–32)
Calcium: 10.5 mg/dL — ABNORMAL HIGH (ref 8.9–10.3)
Creatinine, Ser: 0.95 mg/dL (ref 0.44–1.00)
GFR calc Af Amer: >60 mL/min (ref >60)
GLUCOSE: 103 mg/dL — AB (ref 65–99)
Potassium: 3.8 mmol/L (ref 3.5–5.1)
Sodium: 142 mmol/L (ref 135–145)

## 2017-01-04 LAB — CBC
HCT: 37.2 % (ref 36.0–46.0)
HEMOGLOBIN: 12.4 g/dL (ref 12.0–15.0)
MCH: 29.6 pg (ref 26.0–34.0)
MCHC: 33.3 g/dL (ref 30.0–36.0)
MCV: 88.8 fL (ref 78.0–100.0)
Platelets: 200 10*3/uL (ref 150–400)
RBC: 4.19 MIL/uL (ref 3.87–5.11)
RDW: 13 % (ref 11.5–15.5)
WBC: 5 10*3/uL (ref 4.0–10.5)

## 2017-01-04 NOTE — Pre-Procedure Instructions (Signed)
[name]  01/04/2017      [facility] - [place], [place] - [place] [address] Phone: [phone] Fax: [phone]  [facility] [address], [place] [address] Phone: [phone] Fax: [phone]  [facility] [place], [place] - [address] AT [place] [address] [place] [address] Phone: [phone] Fax: [phone]    Your procedure is scheduled on August 15  Report to [name] at [place].M.  Call this number if you have problems the morning of surgery:  [phone]   Remember:  Do not eat food or drink liquids after midnight.   Take these medicines the morning of surgery with A SIP OF WATER albuterol (PROVENTIL HFA;VENTOLIN HFA), FLUoxetine (PROZAC), levothyroxine (SYNTHROID, LEVOTHROID),   Instruction for Boost Breeze drink- to consume by 4:30  A.M. The morning of surgery  7 days prior to surgery STOP taking any Aspirin, Aleve, Naproxen, Ibuprofen, Motrin, Advil, Goody's, BC's, all herbal medications, fish oil, and all vitamins    Do not wear jewelry, make-up or nail polish.  Do not wear lotions, powders, or perfumes, or deoderant.  Do not shave 48 hours prior to surgery.  Men may shave face and neck.  Do not bring valuables to the hospital.  [facility] is not responsible for any belongings or valuables.  Contacts, dentures or bridgework may not be worn into surgery.  Leave your suitcase in the car.  After surgery it may be brought to your room.  For patients admitted to the hospital, discharge time will be determined by your treatment team.  Patients discharged the day of surgery will not be allowed to drive home.    Special instructions:   [facility]- Preparing For Surgery  Before surgery, you can play an important role. Because  skin is not sterile, your skin needs to be as free of germs as possible. You can reduce the number of germs on your skin by washing with CHG (chlorahexidine gluconate) Soap before surgery.  CHG is an antiseptic cleaner which kills germs and bonds with the skin to continue killing germs even after washing.  Please do not use if you have an allergy to CHG or antibacterial soaps. If your skin becomes reddened/irritated stop using the CHG.  Do not shave (including legs and underarms) for at least 48 hours prior to first CHG shower. It is OK to shave your face.  Please follow these instructions carefully.   1. Shower the NIGHT BEFORE SURGERY and the MORNING OF SURGERY with CHG.   2. If you chose to wash your hair, wash your hair first as usual with your normal shampoo.  3. After you shampoo, rinse your hair and body thoroughly to remove the shampoo.  4. Use CHG as you would any other liquid soap. You can apply CHG directly to the skin and wash gently with a scrungie or a clean washcloth.   5. Apply the CHG Soap to your body ONLY FROM THE NECK DOWN.  Do not use on open wounds or open sores. Avoid contact with your eyes, ears, mouth and genitals (private parts). Wash genitals (private parts) with your normal soap.  6. Wash thoroughly, paying special attention to the area where your surgery will be performed.  7. Thoroughly rinse your body with warm water from the neck down.  8. DO NOT shower/wash with your normal soap after using and rinsing off the CHG Soap.  9. Pat yourself dry with a CLEAN TOWEL.   10. Wear CLEAN PAJAMAS   11. Place CLEAN SHEETS on your bed the night of your first shower and DO NOT SLEEP WITH PETS.    Day of Surgery: Do not apply any deodorants/lotions. Please wear clean clothes to the hospital/surgery center.      Please read over the following fact sheets that you were given.

## 2017-01-09 ENCOUNTER — Ambulatory Visit
Admission: RE | Admit: 2017-01-09 | Discharge: 2017-01-09 | Disposition: A | Discharge status: Home / Self Care | Payer: Medicare Other | Source: Ambulatory Visit | Attending: Surgery | Admitting: Surgery

## 2017-01-09 DIAGNOSIS — D0511 Intraductal carcinoma in situ of right breast: Secondary | ICD-10-CM

## 2017-01-09 MED ORDER — GABAPENTIN 300 MG PO CAPS
300.0000 mg | ORAL_CAPSULE | ORAL | Status: AC
  Administered 2017-01-10: 300 mg via ORAL
  Filled 2017-01-09: qty 1

## 2017-01-09 MED ORDER — CELECOXIB 200 MG PO CAPS
400.0000 mg | ORAL_CAPSULE | ORAL | Status: AC
  Administered 2017-01-10: 400 mg via ORAL
  Filled 2017-01-09: qty 2

## 2017-01-09 MED ORDER — ACETAMINOPHEN 500 MG PO TABS
1000.0000 mg | ORAL_TABLET | ORAL | Status: AC
  Administered 2017-01-10: 1000 mg via ORAL
  Filled 2017-01-09: qty 2

## 2017-01-09 MED ORDER — CEFAZOLIN SODIUM-DEXTROSE 2-4 GM/100ML-% IV SOLN
2.0000 g | INTRAVENOUS | Status: AC
  Administered 2017-01-10: 2 g via INTRAVENOUS
  Filled 2017-01-09: qty 100

## 2017-01-09 NOTE — Anesthesia Preprocedure Evaluation (Addendum)
Anesthesia Evaluation  Patient identified by MRN, date of birth, ID band Patient awake    Reviewed: Allergy & Precautions, H&P , NPO status , Patient's Chart, lab work & pertinent test results  Airway Mallampati: II  TM Distance: >3 FB Neck ROM: Full    Dental no notable dental hx. (+) Teeth Intact, Dental Advisory Given   Pulmonary asthma , former smoker,    Pulmonary exam normal breath sounds clear to auscultation       Cardiovascular Exercise Tolerance: Good hypertension, Pt. on medications  Rhythm:Regular Rate:Normal     Neuro/Psych Depression negative neurological ROS     GI/Hepatic negative GI ROS, Neg liver ROS,   Endo/Other  Hypothyroidism   Renal/GU Renal disease  negative genitourinary   Musculoskeletal  (+) Arthritis , Osteoarthritis,    Abdominal   Peds  Hematology negative hematology ROS (+) anemia ,   Anesthesia Other Findings   Reproductive/Obstetrics negative OB ROS                            Anesthesia Physical Anesthesia Plan  ASA: II  Anesthesia Plan: General   Post-op Pain Management:    Induction: Intravenous  PONV Risk Score and Plan: 4 or greater and Ondansetron, Dexamethasone and Midazolam  Airway Management Planned: LMA  Additional Equipment:   Intra-op Plan:   Post-operative Plan: Extubation in OR  Informed Consent: I have reviewed the patients History and Physical, chart, labs and discussed the procedure including the risks, benefits and alternatives for the proposed anesthesia with the patient or authorized representative who has indicated his/her understanding and acceptance.   Dental advisory given  Plan Discussed with: CRNA  Anesthesia Plan Comments:         Anesthesia Quick Evaluation

## 2017-01-10 ENCOUNTER — Ambulatory Visit (HOSPITAL_COMMUNITY)
Admission: RE | Admit: 2017-01-10 | Discharge: 2017-01-10 | Disposition: A | Discharge status: Home / Self Care | Payer: Medicare Other | Source: Ambulatory Visit | Attending: Surgery | Admitting: Surgery

## 2017-01-10 ENCOUNTER — Ambulatory Visit (HOSPITAL_COMMUNITY): Payer: Medicare Other | Admitting: Certified Registered Nurse Anesthetist

## 2017-01-10 ENCOUNTER — Encounter (HOSPITAL_COMMUNITY): Payer: Self-pay | Admitting: Urology

## 2017-01-10 ENCOUNTER — Encounter (HOSPITAL_COMMUNITY)
Admission: RE | Disposition: A | Discharge status: Home / Self Care | Payer: Self-pay | Source: Ambulatory Visit | Attending: Surgery

## 2017-01-10 ENCOUNTER — Ambulatory Visit
Admission: RE | Admit: 2017-01-10 | Discharge: 2017-01-10 | Disposition: A | Discharge status: Home / Self Care | Payer: Medicare Other | Source: Ambulatory Visit | Attending: Surgery | Admitting: Surgery

## 2017-01-10 DIAGNOSIS — Z87891 Personal history of nicotine dependence: Secondary | ICD-10-CM | POA: Diagnosis not present

## 2017-01-10 DIAGNOSIS — F329 Major depressive disorder, single episode, unspecified: Secondary | ICD-10-CM | POA: Diagnosis not present

## 2017-01-10 DIAGNOSIS — I10 Essential (primary) hypertension: Secondary | ICD-10-CM | POA: Insufficient documentation

## 2017-01-10 DIAGNOSIS — Z79899 Other long term (current) drug therapy: Secondary | ICD-10-CM | POA: Diagnosis not present

## 2017-01-10 DIAGNOSIS — D0511 Intraductal carcinoma in situ of right breast: Secondary | ICD-10-CM | POA: Diagnosis not present

## 2017-01-10 DIAGNOSIS — M1990 Unspecified osteoarthritis, unspecified site: Secondary | ICD-10-CM | POA: Diagnosis not present

## 2017-01-10 DIAGNOSIS — Z791 Long term (current) use of non-steroidal anti-inflammatories (NSAID): Secondary | ICD-10-CM | POA: Insufficient documentation

## 2017-01-10 DIAGNOSIS — J45909 Unspecified asthma, uncomplicated: Secondary | ICD-10-CM | POA: Insufficient documentation

## 2017-01-10 DIAGNOSIS — E039 Hypothyroidism, unspecified: Secondary | ICD-10-CM | POA: Diagnosis not present

## 2017-01-10 HISTORY — PX: BREAST LUMPECTOMY WITH RADIOACTIVE SEED LOCALIZATION: SHX6424

## 2017-01-10 SURGERY — BREAST LUMPECTOMY WITH RADIOACTIVE SEED LOCALIZATION
Anesthesia: General | Site: Breast | Laterality: Right

## 2017-01-10 MED ORDER — BUPIVACAINE-EPINEPHRINE 0.25% -1:200000 IJ SOLN
INTRAMUSCULAR | Status: DC | PRN
  Administered 2017-01-10: 10 mL

## 2017-01-10 MED ORDER — CHLORHEXIDINE GLUCONATE CLOTH 2 % EX PADS: 6.0000 | MEDICATED_PAD | Freq: Once | CUTANEOUS | Status: DC

## 2017-01-10 MED ORDER — PROPOFOL 10 MG/ML IV BOLUS
INTRAVENOUS | Status: DC | PRN
  Administered 2017-01-10: 130 mg via INTRAVENOUS

## 2017-01-10 MED ORDER — HYDROMORPHONE HCL 1 MG/ML IJ SOLN: 0.2500 mg | INTRAMUSCULAR | Status: DC | PRN

## 2017-01-10 MED ORDER — MIDAZOLAM HCL 5 MG/5ML IJ SOLN
INTRAMUSCULAR | Status: DC | PRN
  Administered 2017-01-10: 2 mg via INTRAVENOUS

## 2017-01-10 MED ORDER — MIDAZOLAM HCL 2 MG/2ML IJ SOLN
INTRAMUSCULAR | Status: AC
  Filled 2017-01-10: qty 2

## 2017-01-10 MED ORDER — ONDANSETRON HCL 4 MG/2ML IJ SOLN
INTRAMUSCULAR | Status: DC | PRN
  Administered 2017-01-10: 4 mg via INTRAVENOUS

## 2017-01-10 MED ORDER — 0.9 % SODIUM CHLORIDE (POUR BTL) OPTIME
TOPICAL | Status: DC | PRN
  Administered 2017-01-10: 1000 mL

## 2017-01-10 MED ORDER — BUPIVACAINE-EPINEPHRINE (PF) 0.25% -1:200000 IJ SOLN
INTRAMUSCULAR | Status: AC
  Filled 2017-01-10: qty 30

## 2017-01-10 MED ORDER — DEXAMETHASONE SODIUM PHOSPHATE 10 MG/ML IJ SOLN
INTRAMUSCULAR | Status: AC
  Filled 2017-01-10: qty 1

## 2017-01-10 MED ORDER — DEXAMETHASONE SODIUM PHOSPHATE 10 MG/ML IJ SOLN
INTRAMUSCULAR | Status: DC | PRN
  Administered 2017-01-10: 5 mg via INTRAVENOUS

## 2017-01-10 MED ORDER — ONDANSETRON HCL 4 MG/2ML IJ SOLN
INTRAMUSCULAR | Status: AC
  Filled 2017-01-10: qty 2

## 2017-01-10 MED ORDER — HYDROCODONE-ACETAMINOPHEN 5-325 MG PO TABS: 1.0000 | ORAL_TABLET | Freq: Four times a day (QID) | ORAL | 0 refills | Status: AC | PRN

## 2017-01-10 MED ORDER — FENTANYL CITRATE (PF) 250 MCG/5ML IJ SOLN
INTRAMUSCULAR | Status: AC
  Filled 2017-01-10: qty 5

## 2017-01-10 MED ORDER — PHENYLEPHRINE 40 MCG/ML (10ML) SYRINGE FOR IV PUSH (FOR BLOOD PRESSURE SUPPORT)
PREFILLED_SYRINGE | INTRAVENOUS | Status: DC | PRN
  Administered 2017-01-10 (×3): 80 ug via INTRAVENOUS

## 2017-01-10 MED ORDER — LIDOCAINE 2% (20 MG/ML) 5 ML SYRINGE
INTRAMUSCULAR | Status: DC | PRN
  Administered 2017-01-10: 60 mg via INTRAVENOUS

## 2017-01-10 MED ORDER — LACTATED RINGERS IV SOLN
INTRAVENOUS | Status: DC | PRN
  Administered 2017-01-10: 08:00:00 via INTRAVENOUS

## 2017-01-10 MED ORDER — PHENYLEPHRINE 40 MCG/ML (10ML) SYRINGE FOR IV PUSH (FOR BLOOD PRESSURE SUPPORT)
PREFILLED_SYRINGE | INTRAVENOUS | Status: AC
  Filled 2017-01-10: qty 10

## 2017-01-10 MED ORDER — LIDOCAINE 2% (20 MG/ML) 5 ML SYRINGE
INTRAMUSCULAR | Status: AC
  Filled 2017-01-10: qty 5

## 2017-01-10 MED ORDER — PROPOFOL 10 MG/ML IV BOLUS
INTRAVENOUS | Status: AC
  Filled 2017-01-10: qty 40

## 2017-01-10 MED ORDER — FENTANYL CITRATE (PF) 100 MCG/2ML IJ SOLN
INTRAMUSCULAR | Status: DC | PRN
  Administered 2017-01-10 (×2): 25 ug via INTRAVENOUS
  Administered 2017-01-10: 50 ug via INTRAVENOUS
  Administered 2017-01-10: 25 ug via INTRAVENOUS

## 2017-01-10 SURGICAL SUPPLY — 50 items
APL SKNCLS STERI-STRIP NONHPOA (GAUZE/BANDAGES/DRESSINGS) ×1
APPLIER CLIP 9.375 MED OPEN (MISCELLANEOUS) ×3
APR CLP MED 9.3 20 MLT OPN (MISCELLANEOUS) ×1
BENZOIN TINCTURE PRP APPL 2/3 (GAUZE/BANDAGES/DRESSINGS) ×3 IMPLANT
BINDER BREAST LRG (GAUZE/BANDAGES/DRESSINGS) IMPLANT
BINDER BREAST XLRG (GAUZE/BANDAGES/DRESSINGS) IMPLANT
BLADE SURG 15 STRL LF DISP TIS (BLADE) ×1 IMPLANT
BLADE SURG 15 STRL SS (BLADE) ×3
CANISTER SUCT 3000ML PPV (MISCELLANEOUS) ×3 IMPLANT
CHLORAPREP W/TINT 26ML (MISCELLANEOUS) ×3 IMPLANT
CLIP APPLIE 9.375 MED OPEN (MISCELLANEOUS) IMPLANT
CLOSURE WOUND 1/2 X4 (GAUZE/BANDAGES/DRESSINGS) ×1
COVER PROBE W GEL 5X96 (DRAPES) ×3 IMPLANT
COVER SURGICAL LIGHT HANDLE (MISCELLANEOUS) ×3 IMPLANT
DEVICE DUBIN SPECIMEN MAMMOGRA (MISCELLANEOUS) ×3 IMPLANT
DRAPE CHEST BREAST 15X10 FENES (DRAPES) ×3 IMPLANT
DRAPE UTILITY XL STRL (DRAPES) ×3 IMPLANT
DRSG TEGADERM 4X4.75 (GAUZE/BANDAGES/DRESSINGS) ×3 IMPLANT
ELECT CAUTERY BLADE 6.4 (BLADE) ×3 IMPLANT
ELECT REM PT RETURN 9FT ADLT (ELECTROSURGICAL) ×3
ELECTRODE REM PT RTRN 9FT ADLT (ELECTROSURGICAL) ×1 IMPLANT
GAUZE SPONGE 2X2 8PLY STRL LF (GAUZE/BANDAGES/DRESSINGS) ×1 IMPLANT
GLOVE BIO SURGEON STRL SZ7 (GLOVE) ×3 IMPLANT
GLOVE BIOGEL PI IND STRL 7.5 (GLOVE) ×1 IMPLANT
GLOVE BIOGEL PI INDICATOR 7.5 (GLOVE) ×2
GOWN STRL REUS W/ TWL LRG LVL3 (GOWN DISPOSABLE) ×2 IMPLANT
GOWN STRL REUS W/TWL LRG LVL3 (GOWN DISPOSABLE) ×6
ILLUMINATOR WAVEGUIDE N/F (MISCELLANEOUS) IMPLANT
KIT BASIN OR (CUSTOM PROCEDURE TRAY) ×3 IMPLANT
KIT MARKER MARGIN INK (KITS) ×3 IMPLANT
LIGHT WAVEGUIDE WIDE FLAT (MISCELLANEOUS) IMPLANT
NDL HYPO 25GX1X1/2 BEV (NEEDLE) ×1 IMPLANT
NEEDLE HYPO 25GX1X1/2 BEV (NEEDLE) ×3 IMPLANT
NS IRRIG 1000ML POUR BTL (IV SOLUTION) ×3 IMPLANT
PACK SURGICAL SETUP 50X90 (CUSTOM PROCEDURE TRAY) ×3 IMPLANT
PENCIL BUTTON HOLSTER BLD 10FT (ELECTRODE) ×3 IMPLANT
SPONGE GAUZE 2X2 STER 10/PKG (GAUZE/BANDAGES/DRESSINGS) ×2
SPONGE LAP 4X18 X RAY DECT (DISPOSABLE) ×3 IMPLANT
STRIP CLOSURE SKIN 1/2X4 (GAUZE/BANDAGES/DRESSINGS) ×2 IMPLANT
SUT MNCRL AB 4-0 PS2 18 (SUTURE) ×3 IMPLANT
SUT SILK 2 0 SH (SUTURE) IMPLANT
SUT VIC AB 3-0 SH 27 (SUTURE) ×3
SUT VIC AB 3-0 SH 27X BRD (SUTURE) ×1 IMPLANT
SYR BULB 3OZ (MISCELLANEOUS) ×3 IMPLANT
SYR CONTROL 10ML LL (SYRINGE) ×3 IMPLANT
TOWEL OR 17X24 6PK STRL BLUE (TOWEL DISPOSABLE) ×3 IMPLANT
TOWEL OR 17X26 10 PK STRL BLUE (TOWEL DISPOSABLE) ×1 IMPLANT
TUBE CONNECTING 12'X1/4 (SUCTIONS) ×1
TUBE CONNECTING 12X1/4 (SUCTIONS) ×2 IMPLANT
YANKAUER SUCT BULB TIP NO VENT (SUCTIONS) ×3 IMPLANT

## 2017-01-10 NOTE — H&P (View-Only) (Signed)
History of Present Illness [name]. Misty Buehrer MD; 01/01/2017 12:11 PM) The patient is a 66 year old female who presents with breast cancer. 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 Last year she had a mammogram that showed a probably benign mass at 9:00 in her right breast. She had six-month follow-up. That area seemed to have resolved but at 10:00 she had a new suspicious finding. She underwent further workup including biopsy. 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 Prognostic markers are pending. The patient presents now for her initial surgical evaluation. She is accompanied by her 2 sisters.  The patient is a long-standing patient of this practice. She has had a total abdominal colectomy for recurrent diverticulitis. She also has a ventral hernia that has been evaluated before by Dr. Zella Richer.   Menarche age 54 First pregnancy age 43 Breast-feed yes 4 months 2 years Family history is negative  CLINICAL DATA: One year follow-up for probably benign right breast mass.  EXAM: 2D DIGITAL DIAGNOSTIC BILATERAL MAMMOGRAM WITH CAD AND ADJUNCT TOMO  ULTRASOUND RIGHT BREAST  COMPARISON: 04/27/2016 and earlier  ACR Breast Density Category b: There are scattered areas of fibroglandular density.  FINDINGS: No suspicious mass, distortion, or microcalcifications are identified. Stable appearance of tattoo-related calcifications identified in the upper inner quadrant of the right breast.  Mammographic images were processed with CAD.  On physical exam, I palpate no abnormality in the lateral portion of the right breast.  Targeted ultrasound is performed, showing interval resolution of the group of microcysts in the 9 o'clock location of the right breast. In the 10 o'clock location of the right breast 3 cm from the nipple, there is a small solid oval mass associated with  posterior acoustic enhancement and internal vascularity. Mass measures 0.5 by 0.5 x 0.4 cm. Evaluation of the right axilla is negative for adenopathy.  IMPRESSION: 1. Solid indeterminate mass in the 10 o'clock location of the right breast warranting tissue diagnosis. 2. Interval resolution of group of microcysts in the 9 o'clock location of the right breast. 3. The left breast is negative.  RECOMMENDATION: Right breast ultrasound-guided core biopsy  I have discussed the findings and recommendations with the patient. Results were also provided in writing at the conclusion of the visit. If applicable, a reminder letter will be sent to the patient regarding the next appointment.  BI-RADS CATEGORY 4: Suspicious.   Electronically Signed By: [name] M.D. On: 12/22/2016 16:49  CLINICAL DATA: 66 year old female presenting for ultrasound-guided biopsy of a right breast mass.  EXAM: ULTRASOUND GUIDED RIGHT BREAST CORE NEEDLE BIOPSY  COMPARISON: Previous exam(s).  FINDINGS: I met with the patient and we discussed the procedure of ultrasound-guided biopsy, including benefits and alternatives. We discussed the high likelihood of a successful procedure. We discussed the risks of the procedure, including infection, bleeding, tissue injury, clip migration, and inadequate sampling. Informed written consent was given. The usual time-out protocol was performed immediately prior to the procedure.  Lesion quadrant: Upper-outer quadrant  Using sterile technique and 1% Lidocaine as local anesthetic, under direct ultrasound visualization, a 14 gauge spring-loaded device was used to perform biopsy of mass in the right breast at 10 o'clock using an inferior approach. At the conclusion of the procedure a ribbon shaped tissue marker clip was deployed into the biopsy cavity. Follow up 2 view mammogram was performed and dictated separately.  IMPRESSION: Ultrasound guided biopsy  of a mass  in the right breast at 10 o'clock. No apparent complications.  Electronically Signed: By: [name] M.D. On: 12/27/2016 15:14  CLINICAL DATA: Post biopsy mammogram of the right breast for clip placement.  EXAM: DIAGNOSTIC RIGHT MAMMOGRAM POST ULTRASOUND BIOPSY  COMPARISON: Previous exam(s).  FINDINGS: Mammographic images were obtained following ultrasound guided biopsy of breast mass at 10 o'clock. The ribbon shaped biopsy marking clip is appropriately positioned within the intended site of biopsy at 10 o'clock in the right breast.  IMPRESSION: Appropriate positioning of the ribbon shaped biopsy marking clip within the biopsied mass at 10 o'clock in the right breast.  Final Assessment: Post Procedure Mammograms for Marker Placement   Electronically Signed By: [name] M.D. 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 Microscopic Comment Cytokeratin 5/6 is negative in the luminal areas. The carcinoma in situ appears low grade. Prognostic markers will be ordered. Dr. Lyndon Code has reviewed the case. Preliminary results were called to [facility] on 12/28/2016. Misty Males MD Pathologist, Electronic Signature (Case signed 12/29/2016)   Allergies (Janette Ranson, CMA; 01/01/2017 11:25 AM) No Known Drug Allergies 01/01/2017  Medication History (Janette Ranson, CMA; 01/01/2017 11:28 AM) Levothyroxine Sodium (150MCG Tablet, Oral) Active. FLUoxetine HCl (PMDD) (20MG Capsule, Oral) Active. Diclofenac (75MG Tablet, Oral) Active. Tolterodine Tartrate ER (4MG Capsule ER 24HR, Oral) Active. TraZODone HCl (100MG Tablet, Oral) Active. Montelukast Sodium (10MG Tablet, Oral) Active. Omega 3 (1000MG Capsule, Oral) Active. Zinc (50MG Tablet, Oral) Active. Xyzal (5MG Tablet, Oral) Active. Caltrate 600 + D  (600-125MG-IU Tablet, Oral) Active. Iron (28MG Tablet, Oral) Active.    Vitals (Janette Ranson CMA; 01/01/2017 11:29 AM) 01/01/2017 11:28 AM Weight: 178.4 lb Height: 68in Body Surface Area: 1.95 m Body Mass Index: 27.13 kg/m  Temp.: 61F  Pulse: 78 (Regular)  BP: 110/80 (Sitting, Left Arm, Standard)      Physical Exam Rodman Key K. Danaja Lasota MD; 01/01/2017 12:13 PM)  The physical exam findings are as follows: Note:WDWN in NAD Eyes: Pupils equal, round; sclera anicteric HENT: Oral mucosa moist; good dentition Neck: No masses palpated, no thyromegaly Breasts: symmetric; no nipple retraction or discharge; no axillary lymphadenopathy No dominant masses left breast Right breast - bruising upper outer quadrant; some underlying hematoma Lungs: CTA bilaterally; normal respiratory effort CV: Regular rate and rhythm; no murmurs; extremities well-perfused with no edema Abd: +bowel sounds, soft, non-tender, no palpable organomegaly; large ventral hernia Skin: Warm, dry; no sign of jaundice Psychiatric - alert and oriented x 4; calm mood and affect    Assessment & Plan Rodman Key K. Marleigh Kaylor MD; 01/01/2017 12:13 PM)  DUCTAL CARCINOMA IN SITU (DCIS) OF RIGHT BREAST (D05.11)  Current Plans Schedule for Surgery - Right radioactive seed localized lumpectomy. The surgical procedure has been discussed with the patient. Potential risks, benefits, alternative treatments, and expected outcomes have been explained. All of the patient's questions at this time have been answered. The likelihood of reaching the patient's treatment goal is good. The patient understand the proposed surgical procedure and wishes to proceed. Note:I spent approximately 45 minutes with the patient and her sisters discussing her disease and the options for treatment. I answered his management of the questions as possible. The patient is scheduled to move to [facility] at the end of this month. She wants  to have her surgery here but will likely seek radiation oncology follow-up in [place].  [name]. Georgette Dover, MD, [facility]  Surgery  General/ Trauma Surgery  01/01/2017 12:14 PM

## 2017-01-10 NOTE — Discharge Instructions (Signed)
[facility] °Office Phone Number [phone] ° °BREAST BIOPSY/ PARTIAL MASTECTOMY: POST OP INSTRUCTIONS ° °Always review your discharge instruction sheet given to you by the facility where your surgery was performed. ° °IF YOU HAVE DISABILITY OR FAMILY LEAVE FORMS, YOU MUST BRING THEM TO THE OFFICE FOR PROCESSING.  DO NOT GIVE THEM TO YOUR DOCTOR. ° °1. A prescription for pain medication may be given to you upon discharge.  Take your pain medication as prescribed, if needed.  If narcotic pain medicine is not needed, then you may take acetaminophen (Tylenol) or ibuprofen (Advil) as needed. °2. Take your usually prescribed medications unless otherwise directed °3. If you need a refill on your pain medication, please contact your pharmacy.  They will contact our office to request authorization.  Prescriptions will not be filled after 5pm or on week-ends. °4. You should eat very light the first 24 hours after surgery, such as soup, crackers, pudding, etc.  Resume your normal diet the day after surgery. °5. Most patients will experience some swelling and bruising in the breast.  Ice packs and a good support bra will help.  Swelling and bruising can take several days to resolve.  °6. It is common to experience some constipation if taking pain medication after surgery.  Increasing fluid intake and taking a stool softener will usually help or prevent this problem from occurring.  A mild laxative (Milk of Magnesia or Miralax) should be taken according to package directions if there are no bowel movements after 48 hours. °7. Unless discharge instructions indicate otherwise, you may remove your bandages 24-48 hours after surgery, and you may shower at that time.  You may have steri-strips (small skin tapes) in place directly over the incision.  These strips should be left on the skin for 7-10 days.  If your surgeon used skin glue on the incision, you may shower in 24 hours.  The glue will flake off over the  next 2-3 weeks.  Any sutures or staples will be removed at the office during your follow-up visit. °8. ACTIVITIES:  You may resume regular daily activities (gradually increasing) beginning the next day.  Wearing a good support bra or sports bra minimizes pain and swelling.  You may have sexual intercourse when it is comfortable. °a. You may drive when you no longer are taking prescription pain medication, you can comfortably wear a seatbelt, and you can safely maneuver your car and apply brakes. °b. RETURN TO WORK:  ______________________________________________________________________________________ °9. You should see your doctor in the office for a follow-up appointment approximately two weeks after your surgery.  Your doctor’s nurse will typically make your follow-up appointment when she calls you with your pathology report.  Expect your pathology report 2-3 business days after your surgery.  You may call to check if you do not hear from us after three days. °10. OTHER INSTRUCTIONS: _______________________________________________________________________________________________ _____________________________________________________________________________________________________________________________________ °_____________________________________________________________________________________________________________________________________ °_____________________________________________________________________________________________________________________________________ ° °WHEN TO CALL YOUR DOCTOR: °1. Fever over 101.0 °2. Nausea and/or vomiting. °3. Extreme swelling or bruising. °4. Continued bleeding from incision. °5. Increased pain, redness, or drainage from the incision. ° °The clinic staff is available to answer your questions during regular business hours.  Please don’t hesitate to call and ask to speak to one of the nurses for clinical concerns.  If you have a medical emergency, go to the nearest  emergency room or call 911.  A surgeon from [facility] is always on call at the hospital. ° °For further questions, please visit [email or web address]  °

## 2017-01-10 NOTE — Anesthesia Procedure Notes (Signed)
Procedure Name: LMA Insertion Date/Time: 01/10/2017 8:38 AM Performed by: Everlean Cherry A Pre-anesthesia Checklist: Patient identified, Emergency Drugs available, Suction available and Patient being monitored Patient Re-evaluated:Patient Re-evaluated prior to induction Oxygen Delivery Method: Circle system utilized Preoxygenation: Pre-oxygenation with 100% oxygen Induction Type: IV induction Ventilation: Mask ventilation without difficulty LMA: LMA inserted LMA Size: 4.0 Number of attempts: 1 Placement Confirmation: positive ETCO2 and breath sounds checked- equal and bilateral Tube secured with: Tape Dental Injury: Teeth and Oropharynx as per pre-operative assessment

## 2017-01-10 NOTE — Anesthesia Postprocedure Evaluation (Signed)
Anesthesia Post Note  Patient: [name]  Procedure(s) Performed: Procedure(s) (LRB): RIGHT BREAST LUMPECTOMY WITH RADIOACTIVE SEED LOCALIZATION ERAS PATHWAY (Right)     Patient location during evaluation: PACU Anesthesia Type: General Level of consciousness: awake and alert Pain management: pain level controlled Vital Signs Assessment: post-procedure vital signs reviewed and stable Respiratory status: spontaneous breathing, nonlabored ventilation and respiratory function stable Cardiovascular status: blood pressure returned to baseline and stable Postop Assessment: no signs of nausea or vomiting Anesthetic complications: no    Last Vitals:  Vitals:   01/10/17 1045 01/10/17 1055  BP:  103/69  Pulse: 90 89  Resp: 18 16  Temp:    SpO2: 95% 96%    Last Pain:  Vitals:   01/10/17 1030  TempSrc:   PainSc: Asleep                 Chelle Cayton,W. EDMOND

## 2017-01-10 NOTE — Interval H&P Note (Signed)
History and Physical Interval Note:  01/10/2017 7:20 AM  [name]  has presented today for surgery, with the diagnosis of Right breast DCIS  The various methods of treatment have been discussed with the patient and family. After consideration of risks, benefits and other options for treatment, the patient has consented to  Procedure(s): RIGHT BREAST LUMPECTOMY WITH RADIOACTIVE SEED LOCALIZATION ERAS PATHWAY (Right) as a surgical intervention .  The patient's history has been reviewed, patient examined, no change in status, stable for surgery.  I have reviewed the patient's chart and labs.  Questions were answered to the patient's satisfaction.     Dailey Alberson K.

## 2017-01-10 NOTE — Transfer of Care (Signed)
Immediate Anesthesia Transfer of Care Note  Patient: [name]  Procedure(s) Performed: Procedure(s): RIGHT BREAST LUMPECTOMY WITH RADIOACTIVE SEED LOCALIZATION ERAS PATHWAY (Right)  Patient Location: PACU  Anesthesia Type:General  Level of Consciousness: awake, alert , oriented and patient cooperative  Airway & Oxygen Therapy: Patient Spontanous Breathing and Patient connected to nasal cannula oxygen  Post-op Assessment: Report given to RN, Post -op Vital signs reviewed and stable and Patient moving all extremities X 4  Post vital signs: Reviewed and stable  Last Vitals:  Vitals:   01/10/17 0639  BP: 115/71  Pulse: 88  Resp: 18  Temp: 36.7 C  SpO2: 98%    Last Pain:  Vitals:   01/10/17 0639  TempSrc: Oral         Complications: No apparent anesthesia complications

## 2017-01-10 NOTE — Op Note (Signed)
Pre-op Diagnosis:  Right breast DCIS Post-op Diagnosis: same Procedure:  Right radioactive seed localized lumpectomy Surgeon:  Zyrah Wiswell K. Anesthesia:  GEN - LMA Indications:  66 yo female with recent mass on mammogram.  Biopsy showed DCIS involving an intraductal papilloma.  She presents now for lumpectomy.  Seed was placed yesterday by radiology.  Description of procedure: The patient is brought to the operating room placed in supine position on the operating room table. After an adequate level of general anesthesia was obtained, her right breast was prepped with ChloraPrep and draped sterile fashion. A timeout was taken to ensure the proper patient and proper procedure. We interrogated the breast with the neoprobe. The mass is located at 10:00 about 2 cm from edge of nipple.  We made a circumareolar incision around the side of the nipple after infiltrating with 0.25% Marcaine. Dissection was carried down in the breast tissue with cautery. We used the neoprobe to guide [place] towards the radioactive seed. We excised an area of tissue around the radioactive seed 2 cm in diameter. The specimen was removed and was oriented with a paint kit. Specimen mammogram showed the radioactive seed as well as the biopsy clip within the specimen. This was sent for pathologic examination. There is no residual radioactivity within the biopsy cavity. We inspected carefully for hemostasis. The wound was thoroughly irrigated. The wound was closed with a deep layer of 3-0 Vicryl and a subcuticular layer of 4-0 Monocryl. Benzoin Steri-Strips were applied. The patient was then extubated and brought to the recovery room in stable condition. All sponge, instrument, and needle counts are correct.  Imogene Burn. Georgette Dover, MD, [facility]  General/ Trauma Surgery  01/10/2017 9:35 AM

## 2017-01-11 ENCOUNTER — Encounter (HOSPITAL_COMMUNITY): Payer: Self-pay | Admitting: Surgery

## 2017-01-22 ENCOUNTER — Emergency Department (HOSPITAL_BASED_OUTPATIENT_CLINIC_OR_DEPARTMENT_OTHER): Payer: Medicare Other

## 2017-01-22 ENCOUNTER — Emergency Department (HOSPITAL_BASED_OUTPATIENT_CLINIC_OR_DEPARTMENT_OTHER)
Admission: EM | Admit: 2017-01-22 | Discharge: 2017-01-22 | Disposition: A | Discharge status: Home / Self Care | Payer: Medicare Other | Attending: Emergency Medicine | Admitting: Emergency Medicine

## 2017-01-22 ENCOUNTER — Encounter (HOSPITAL_BASED_OUTPATIENT_CLINIC_OR_DEPARTMENT_OTHER): Payer: Self-pay | Admitting: *Deleted

## 2017-01-22 DIAGNOSIS — Y929 Unspecified place or not applicable: Secondary | ICD-10-CM | POA: Insufficient documentation

## 2017-01-22 DIAGNOSIS — Z85850 Personal history of malignant neoplasm of thyroid: Secondary | ICD-10-CM | POA: Insufficient documentation

## 2017-01-22 DIAGNOSIS — Y999 Unspecified external cause status: Secondary | ICD-10-CM | POA: Diagnosis not present

## 2017-01-22 DIAGNOSIS — I129 Hypertensive chronic kidney disease with stage 1 through stage 4 chronic kidney disease, or unspecified chronic kidney disease: Secondary | ICD-10-CM | POA: Diagnosis not present

## 2017-01-22 DIAGNOSIS — W0110XA Fall on same level from slipping, tripping and stumbling with subsequent striking against unspecified object, initial encounter: Secondary | ICD-10-CM | POA: Insufficient documentation

## 2017-01-22 DIAGNOSIS — J45909 Unspecified asthma, uncomplicated: Secondary | ICD-10-CM | POA: Diagnosis not present

## 2017-01-22 DIAGNOSIS — Y9301 Activity, walking, marching and hiking: Secondary | ICD-10-CM | POA: Diagnosis not present

## 2017-01-22 DIAGNOSIS — Z87891 Personal history of nicotine dependence: Secondary | ICD-10-CM | POA: Insufficient documentation

## 2017-01-22 DIAGNOSIS — Z79899 Other long term (current) drug therapy: Secondary | ICD-10-CM | POA: Insufficient documentation

## 2017-01-22 DIAGNOSIS — S6992XA Unspecified injury of left wrist, hand and finger(s), initial encounter: Secondary | ICD-10-CM | POA: Diagnosis present

## 2017-01-22 DIAGNOSIS — N189 Chronic kidney disease, unspecified: Secondary | ICD-10-CM | POA: Diagnosis not present

## 2017-01-22 DIAGNOSIS — S52502A Unspecified fracture of the lower end of left radius, initial encounter for closed fracture: Secondary | ICD-10-CM | POA: Insufficient documentation

## 2017-01-22 DIAGNOSIS — E039 Hypothyroidism, unspecified: Secondary | ICD-10-CM | POA: Insufficient documentation

## 2017-01-22 DIAGNOSIS — M25532 Pain in left wrist: Secondary | ICD-10-CM

## 2017-01-22 MED ORDER — ACETAMINOPHEN 500 MG PO TABS
1000.0000 mg | ORAL_TABLET | Freq: Once | ORAL | Status: AC
  Administered 2017-01-22: 1000 mg via ORAL
  Filled 2017-01-22: qty 2

## 2017-01-22 MED ORDER — MORPHINE SULFATE 15 MG PO TABS: 15.0000 mg | ORAL_TABLET | ORAL | 0 refills | Status: AC | PRN

## 2017-01-22 MED ORDER — OXYCODONE HCL 5 MG PO TABS
5.0000 mg | ORAL_TABLET | Freq: Once | ORAL | Status: AC
  Administered 2017-01-22: 5 mg via ORAL
  Filled 2017-01-22: qty 1

## 2017-01-22 NOTE — ED Triage Notes (Signed)
She slipped on water and fell. Injury to her left wrist. Deformity noted.

## 2017-01-22 NOTE — ED Provider Notes (Signed)
[facility] Provider Note   CSN: [id number] Arrival date & time: 01/22/17  1707     History   Chief Complaint Chief Complaint  Patient presents with  . Fall  . Wrist Pain    HPI [name] is a 66 y.o. female.  66 yo F with a chief complaint of left wrist pain. This occurred after the patient fell. She slipped on a wet floor and landed on an outstretched hand. Complaining of pain to the wrist. Denies head injury denies loss of consciousness. Denies elbow pain. Dull and sometimes sharp and shooting pain. Denies radiation. Denies numbness or tingling. Pain is worse with movement of the hand.   The history is provided by the patient.  Fall  This is a new problem. The current episode started 3 to 5 hours ago. The problem occurs rarely. The problem has been resolved. Pertinent negatives include no chest pain, no headaches and no shortness of breath. The symptoms are aggravated by bending and twisting. Nothing relieves the symptoms. She has tried nothing for the symptoms. The treatment provided no relief.  Wrist Pain  Pertinent negatives include no chest pain, no headaches and no shortness of breath.    Past Medical History:  Diagnosis Date  . ALLERGIC RHINITIS 07/23/2009  . Anemia    no recently  . Arthritis    osteoarthritis- hands & hips  . Asthma 11-17-11   no flareups in adult yrs. Albuterol used rarely  . Cancer ([place])    thyroid Ca - 1982  . Chronic kidney disease 11-17-11   hx. past multiple kidney stones, prev. lithos  . DEPRESSION 02/23/2010  . 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 History of hiatal hernia    umbilical hernias   . History of kidney stones    3 lithotripsy  . Hospital-acquired pneumonia 05/28/2011  . HYPERLIPIDEMIA 10/06/2008  . HYPERTENSION 10/06/2008   EKG 12/12 EPIC, pt. reports PCP- Dr. Gordy Levan d/c'd meds. 1st of 2018   . HYPOTHYROIDISM 10/06/2008  . 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 Coumadin-all resolved. no coumadin since 11/09/11  . 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 Pneumonia 11-17-11   1'13 postop pneumonia.     Patient Active Problem List   Diagnosis Date Noted  . SBO (small bowel obstruction) ([place]) 03/21/2013  . Recurrent ventral incisional hernia 03/21/2013  . Anemia 08/19/2012  . Fecal incontinence 07/05/2012  . DEPRESSION 02/23/2010  . ALLERGIC RHINITIS 07/23/2009  . FATIGUE 01/29/2009  . HYPOTHYROIDISM 10/06/2008  . HYPERLIPIDEMIA 10/06/2008  . HYPERTENSION 10/06/2008  . 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 ABDOMINAL HYSTERECTOMY  1986   fibroids  . APPENDECTOMY    . BREAST LUMPECTOMY WITH RADIOACTIVE SEED LOCALIZATION Right 01/10/2017   Procedure: RIGHT BREAST LUMPECTOMY WITH RADIOACTIVE SEED LOCALIZATION ERAS PATHWAY;  Surgeon: Donnie Mesa, MD;  Location: [facility];  Service: General;  Laterality: Right;  . CESAREAN SECTION  1983  . CREATION / REVISION OF ILEOSTOMY / JEJUNOSTOMY  05/14/2011   small bowel resection, ileostomy  . CYSTOSCOPY W/ URETERAL STENT PLACEMENT Right 02/09/2013   Procedure: CYSTOSCOPY WITH RETROGRADE PYELOGRAM/URETERAL STENT PLACEMENT;  Surgeon: Fredricka Bonine, MD;  Location: [facility];  Service: Urology;  Laterality: Right;  . CYSTOSCOPY WITH URETEROSCOPY AND STENT  PLACEMENT Right 03/04/2013   Procedure: CYSTOSCOPY WITH URETEROSCOPY, LASER LITHOTRIPSY  AND STENT PLACEMENT;  Surgeon: Fredricka Bonine, MD;  Location: [facility];  Service: Urology;  Laterality: Right;  . DIVERTING ILEOSTOMY  12/12   ileostomy   . EYE SURGERY     lasik  bilaterally  . HOLMIUM LASER APPLICATION N/A 65/0/3546   Procedure: HOLMIUM LASER APPLICATION;  Surgeon: Fredricka Bonine, MD;  Location: [facility];  Service:  Urology;  Laterality: N/A;  . ILEO LOOP COLOSTOMY CLOSURE  01/18/2012   Procedure: LAPAROSCOPIC ILEO LOOP COLOSTOMY CLOSURE;  Surgeon: Odis Hollingshead, MD;  Location: [facility];  Service: General;  Laterality: N/A;  Laparoscopic Ileostomy Closure , lysis of adhesions   . ILEOSTOMY CLOSURE  05/02/2011   ileosomy takedown  . [place]  2000, 2009, 2010  . LAPAROSCOPIC ASSISSTED TOTAL COLECTOMY W/ J-POUCH  03/01/11   for pandiverticulosis/itis  . ROTATOR CUFF REPAIR  11-17-11   left '05  . THYROID SURGERY  1982   tumor removed    OB History    No data available       Home Medications    Prior to Admission medications   Medication Sig Start Date End Date Taking? Authorizing Provider  albuterol (PROVENTIL HFA;VENTOLIN HFA) 108 (90 BASE) MCG/ACT inhaler Inhale 2 puffs into the lungs every 4 (four) hours as needed for wheezing. 09/20/12   Burchette, Alinda Sierras, MD  Calcium Carbonate-Vit D-Min (CALTRATE 600+D PLUS PO) Take 1 tablet by mouth at bedtime.    [provider]  diclofenac (VOLTAREN) 75 MG EC tablet Take 75 mg by mouth daily. 12/12/16   [provider]  Ferrous Sulfate (HIGH POTENCY IRON PO) Take 65 mg by mouth every evening.    [provider]  FLUoxetine (PROZAC) 20 MG capsule Take 20 mg by mouth 2 (two) times daily. 12/22/16   [provider]  HYDROcodone-acetaminophen (NORCO/VICODIN) 5-325 MG tablet Take 1-2 tablets by mouth every 6 (six) hours as needed for moderate pain. 01/10/17   Donnie Mesa, MD  KRILL OIL PO Take 600 mg by mouth daily.    [provider]  levocetirizine (XYZAL) 5 MG tablet Take 5 mg by mouth daily before breakfast.     [provider]  levothyroxine (SYNTHROID, LEVOTHROID) 150 MCG tablet take 1 tablet by mouth once daily BEFORE BREAKFAST 09/08/13   Burchette, Alinda Sierras, MD  montelukast (SINGULAIR) 10 MG tablet Take 10 mg by mouth at bedtime. 12/12/16   [provider]  morphine (MSIR) 15 MG  tablet Take 1 tablet (15 mg total) by mouth every 4 (four) hours as needed for severe pain. 01/22/17   Deno Etienne, DO  tolterodine (DETROL LA) 4 MG 24 hr capsule Take 4 mg by mouth at bedtime. 12/12/16   [provider]  traZODone (DESYREL) 100 MG tablet Take 100 mg by mouth at bedtime. 12/12/16   [provider]  zinc gluconate 50 MG tablet Take 50 mg by mouth daily.    [provider]    Family History Family History  Problem Relation Age of Onset  . COPD Mother   . Depression Mother   . Deep vein thrombosis Father   . Depression Father   . Arthritis Other   . Mental illness Other   . Depression Sister     Social History Social History  Substance Use Topics  . Smoking status: Former Smoker    Packs/day: 1.00    Years: 10.00  Types: Cigarettes    Quit date: 08/02/1980  . Smokeless tobacco: Never Used  . Alcohol use No     Allergies   Modafinil   Review of Systems Review of Systems  Constitutional: Negative for chills and fever.  HENT: Negative for congestion and rhinorrhea.   Eyes: Negative for redness and visual disturbance.  Respiratory: Negative for shortness of breath and wheezing.   Cardiovascular: Negative for chest pain and palpitations.  Gastrointestinal: Negative for nausea and vomiting.  Genitourinary: Negative for dysuria and urgency.  Musculoskeletal: Positive for arthralgias and myalgias.  Skin: Negative for pallor and wound.  Neurological: Negative for dizziness and headaches.     Physical Exam Updated Vital Signs BP 133/89   Pulse 84   Temp 98.5 F (36.9 C) (Oral)   Resp 20   Ht 5\' 8"  (1.727 m)   Wt 80.7 kg (178 lb)   SpO2 100%   BMI 27.06 kg/m   Physical Exam  Constitutional: She is oriented to person, place, and time. She appears well-developed and well-nourished. No distress.  HENT:  Head: Normocephalic and atraumatic.  Eyes: Pupils are equal, round, and reactive to light. EOM are normal.  Neck: Normal  range of motion. Neck supple.  Cardiovascular: Normal rate and regular rhythm.  Exam reveals no gallop and no friction rub.   No murmur heard. Pulmonary/Chest: Effort normal. She has no wheezes. She has no rales.  Abdominal: Soft. She exhibits no distension. There is no tenderness.  Musculoskeletal: She exhibits tenderness. She exhibits no edema.  Diffuse pain about the distal radius. Pulse motor and sensation is intact distally. No deformity. No significant edema.  Neurological: She is alert and oriented to person, place, and time.  Skin: Skin is warm and dry. She is not diaphoretic.  Psychiatric: She has a normal mood and affect. Her behavior is normal.  Nursing note and vitals reviewed.    ED Treatments / Results  Labs (all labs ordered are listed, but only abnormal results are displayed) Labs Reviewed - No data to display  EKG  EKG Interpretation None       Radiology Dg Wrist Complete Left  Result Date: 01/22/2017 CLINICAL DATA:  Slip and fall.  Wrist deformity. EXAM: LEFT WRIST - COMPLETE 3+ VIEW COMPARISON:  None. FINDINGS: Acute comminuted distal radial fracture with intra-articular extension, in alignment. No dislocation. No destructive bony lesions. Soft tissue swelling without subcutaneous gas radiopaque foreign bodies. IMPRESSION: Acute nondisplaced distal radial fracture.  No dislocation. Electronically Signed   By: Elon Alas M.D.   On: 01/22/2017 18:05    Procedures Procedures (including critical care time)  Medications Ordered in ED Medications  acetaminophen (TYLENOL) tablet 1,000 mg (1,000 mg Oral Given 01/22/17 1900)  oxyCODONE (Oxy IR/ROXICODONE) immediate release tablet 5 mg (5 mg Oral Given 01/22/17 1900)     Initial Impression / Assessment and Plan / ED Course  I have reviewed the triage vital signs and the nursing notes.  Pertinent labs & imaging results that were available during my care of the patient were reviewed by me and considered in my  medical decision making (see chart for details).     65 yo F With a chief complaint of left wrist pain. X-ray viewed by me looks like a distal radius fracture with extension into the joint. Placed in a splint. Follow-up with hand surgery.  SPLINT APPLICATION Date/Time: 4:09 PM Authorized by: Cecilio Asper Consent: Verbal consent obtained. Risks and benefits: risks, benefits and alternatives were discussed  Consent given by: patient Splint applied by: orthopedic technician Location details: left wrist Splint type: sugar tong Supplies used:orthoglass Post-procedure: The splinted body part was neurovascularly unchanged following the procedure. Patient tolerance: Patient tolerated the procedure well with no immediate complications.     7:01 PM:  I have discussed the diagnosis/risks/treatment options with the patient and family and believe the pt to be eligible for discharge home to follow-up with PCP. We also discussed returning to the ED immediately if new or worsening sx occur. We discussed the sx which are most concerning (e.g., sudden worsening pain, fever, inability to tolerate by mouth) that necessitate immediate return. Medications administered to the patient during their visit and any new prescriptions provided to the patient are listed below.  Medications given during this visit Medications  acetaminophen (TYLENOL) tablet 1,000 mg (1,000 mg Oral Given 01/22/17 1900)  oxyCODONE (Oxy IR/ROXICODONE) immediate release tablet 5 mg (5 mg Oral Given 01/22/17 1900)     The patient appears reasonably screen and/or stabilized for discharge and I doubt any other medical condition or other [facility] requiring further screening, evaluation, or treatment in the ED at this time prior to discharge.    Final Clinical Impressions(s) / ED Diagnoses   Final diagnoses:  Closed fracture of distal end of left radius, unspecified fracture morphology, initial encounter  Left wrist pain    New  Prescriptions New Prescriptions   MORPHINE (MSIR) 15 MG TABLET    Take 1 tablet (15 mg total) by mouth every 4 (four) hours as needed for severe pain.     Deno Etienne, DO 01/22/17 1901

## 2017-01-22 NOTE — ED Notes (Signed)
Patient is sitting at the edge of the bed with an ice pack to her left arm.  Pt is not able to move her fingers and her hand.  She verbalized severe pain when she tried to raised her left arm.

## 2017-01-22 NOTE — Discharge Instructions (Signed)
Tylenol 1000mg (2 extra strength) four times a day.   Then take the pain medicine if you feel like you need it. Narcotics do not help with the pain, they only make you care about it less.  You can become addicted to this, people may break into your house to steal it.  It will constipate you.  If you drive under the influence of this medicine you can get a DUI.
